# Patient Record
Sex: Female | Born: 1935 | Race: Black or African American | Hispanic: No | Smoking: Former smoker
Health system: Southern US, Community
[De-identification: ages and names within clinical notes are randomized; demographics above are authoritative.]

## PROBLEM LIST (undated history)

## (undated) DIAGNOSIS — H409 Unspecified glaucoma: Secondary | ICD-10-CM

## (undated) DIAGNOSIS — E785 Hyperlipidemia, unspecified: Secondary | ICD-10-CM

## (undated) DIAGNOSIS — J9811 Atelectasis: Secondary | ICD-10-CM

## (undated) DIAGNOSIS — R0789 Other chest pain: Secondary | ICD-10-CM

## (undated) DIAGNOSIS — R918 Other nonspecific abnormal finding of lung field: Secondary | ICD-10-CM

## (undated) DIAGNOSIS — I10 Essential (primary) hypertension: Secondary | ICD-10-CM

## (undated) DIAGNOSIS — M1990 Unspecified osteoarthritis, unspecified site: Secondary | ICD-10-CM

## (undated) DIAGNOSIS — I739 Peripheral vascular disease, unspecified: Secondary | ICD-10-CM

## (undated) DIAGNOSIS — K219 Gastro-esophageal reflux disease without esophagitis: Secondary | ICD-10-CM

## (undated) HISTORY — PX: ANAL FISSURE REPAIR: SHX2312

## (undated) HISTORY — DX: Other chest pain: R07.89

## (undated) HISTORY — PX: BREAST BIOPSY: SHX20

## (undated) HISTORY — DX: Gastro-esophageal reflux disease without esophagitis: K21.9

## (undated) HISTORY — PX: NASAL SINUS SURGERY: SHX719

## (undated) HISTORY — PX: TOTAL ABDOMINAL HYSTERECTOMY W/ BILATERAL SALPINGOOPHORECTOMY: SHX83

## (undated) HISTORY — PX: CATARACT EXTRACTION: SUR2

## (undated) HISTORY — DX: Essential (primary) hypertension: I10

## (undated) HISTORY — DX: Hyperlipidemia, unspecified: E78.5

## (undated) HISTORY — DX: Unspecified glaucoma: H40.9

## (undated) HISTORY — DX: Peripheral vascular disease, unspecified: I73.9

## (undated) HISTORY — DX: Other nonspecific abnormal finding of lung field: R91.8

## (undated) HISTORY — DX: Atelectasis: J98.11

---

## 1997-06-02 ENCOUNTER — Ambulatory Visit (HOSPITAL_COMMUNITY): Admission: RE | Admit: 1997-06-02 | Discharge: 1997-06-02 | Payer: Self-pay | Admitting: Obstetrics and Gynecology

## 1998-03-07 ENCOUNTER — Other Ambulatory Visit: Admission: RE | Admit: 1998-03-07 | Discharge: 1998-03-07 | Payer: Self-pay | Admitting: Obstetrics and Gynecology

## 1998-03-17 ENCOUNTER — Ambulatory Visit (HOSPITAL_COMMUNITY): Admission: RE | Admit: 1998-03-17 | Discharge: 1998-03-17 | Payer: Self-pay | Admitting: Obstetrics and Gynecology

## 1998-04-13 ENCOUNTER — Emergency Department (HOSPITAL_COMMUNITY): Admission: EM | Admit: 1998-04-13 | Discharge: 1998-04-13 | Payer: Self-pay | Admitting: Emergency Medicine

## 1998-04-13 ENCOUNTER — Encounter: Payer: Self-pay | Admitting: Emergency Medicine

## 1998-09-12 ENCOUNTER — Other Ambulatory Visit: Admission: RE | Admit: 1998-09-12 | Discharge: 1998-09-12 | Payer: Self-pay | Admitting: Obstetrics and Gynecology

## 1999-04-05 ENCOUNTER — Encounter: Payer: Self-pay | Admitting: Obstetrics and Gynecology

## 1999-04-05 ENCOUNTER — Ambulatory Visit (HOSPITAL_COMMUNITY): Admission: RE | Admit: 1999-04-05 | Discharge: 1999-04-05 | Payer: Self-pay | Admitting: Obstetrics and Gynecology

## 1999-04-25 ENCOUNTER — Other Ambulatory Visit: Admission: RE | Admit: 1999-04-25 | Discharge: 1999-04-25 | Payer: Self-pay | Admitting: Obstetrics and Gynecology

## 1999-08-16 ENCOUNTER — Encounter: Admission: RE | Admit: 1999-08-16 | Discharge: 1999-08-16 | Payer: Self-pay | Admitting: Gastroenterology

## 1999-08-16 ENCOUNTER — Encounter: Payer: Self-pay | Admitting: Gastroenterology

## 1999-12-19 ENCOUNTER — Encounter: Admission: RE | Admit: 1999-12-19 | Discharge: 1999-12-19 | Payer: Self-pay | Admitting: Orthopedic Surgery

## 1999-12-19 ENCOUNTER — Encounter: Payer: Self-pay | Admitting: Orthopedic Surgery

## 2000-04-16 ENCOUNTER — Encounter: Payer: Self-pay | Admitting: Obstetrics and Gynecology

## 2000-04-16 ENCOUNTER — Ambulatory Visit (HOSPITAL_COMMUNITY): Admission: RE | Admit: 2000-04-16 | Discharge: 2000-04-16 | Payer: Self-pay | Admitting: Obstetrics and Gynecology

## 2000-04-24 ENCOUNTER — Other Ambulatory Visit: Admission: RE | Admit: 2000-04-24 | Discharge: 2000-04-24 | Payer: Self-pay | Admitting: Obstetrics and Gynecology

## 2000-07-31 ENCOUNTER — Ambulatory Visit (HOSPITAL_COMMUNITY): Admission: RE | Admit: 2000-07-31 | Discharge: 2000-07-31 | Payer: Self-pay | Admitting: Gastroenterology

## 2000-12-09 ENCOUNTER — Encounter: Admission: RE | Admit: 2000-12-09 | Discharge: 2000-12-09 | Payer: Self-pay | Admitting: Obstetrics and Gynecology

## 2000-12-09 ENCOUNTER — Encounter: Payer: Self-pay | Admitting: Obstetrics and Gynecology

## 2000-12-27 ENCOUNTER — Encounter: Payer: Self-pay | Admitting: Internal Medicine

## 2000-12-27 ENCOUNTER — Encounter: Admission: RE | Admit: 2000-12-27 | Discharge: 2000-12-27 | Payer: Self-pay | Admitting: Internal Medicine

## 2001-05-06 ENCOUNTER — Ambulatory Visit (HOSPITAL_COMMUNITY): Admission: RE | Admit: 2001-05-06 | Discharge: 2001-05-06 | Payer: Self-pay | Admitting: Obstetrics and Gynecology

## 2001-05-06 ENCOUNTER — Encounter: Payer: Self-pay | Admitting: Obstetrics and Gynecology

## 2001-05-14 ENCOUNTER — Other Ambulatory Visit: Admission: RE | Admit: 2001-05-14 | Discharge: 2001-05-14 | Payer: Self-pay | Admitting: Obstetrics and Gynecology

## 2001-10-10 ENCOUNTER — Encounter: Payer: Self-pay | Admitting: Gastroenterology

## 2001-10-10 ENCOUNTER — Encounter: Admission: RE | Admit: 2001-10-10 | Discharge: 2001-10-10 | Payer: Self-pay | Admitting: Gastroenterology

## 2002-05-07 ENCOUNTER — Ambulatory Visit (HOSPITAL_COMMUNITY): Admission: RE | Admit: 2002-05-07 | Discharge: 2002-05-07 | Payer: Self-pay | Admitting: Obstetrics and Gynecology

## 2002-05-07 ENCOUNTER — Encounter: Payer: Self-pay | Admitting: Obstetrics and Gynecology

## 2002-05-20 ENCOUNTER — Other Ambulatory Visit: Admission: RE | Admit: 2002-05-20 | Discharge: 2002-05-20 | Payer: Self-pay | Admitting: Obstetrics and Gynecology

## 2003-05-10 ENCOUNTER — Ambulatory Visit (HOSPITAL_COMMUNITY): Admission: RE | Admit: 2003-05-10 | Discharge: 2003-05-10 | Payer: Self-pay | Admitting: Obstetrics and Gynecology

## 2004-03-12 ENCOUNTER — Emergency Department (HOSPITAL_COMMUNITY): Admission: EM | Admit: 2004-03-12 | Discharge: 2004-03-12 | Payer: Self-pay

## 2004-05-10 ENCOUNTER — Ambulatory Visit (HOSPITAL_COMMUNITY): Admission: RE | Admit: 2004-05-10 | Discharge: 2004-05-10 | Payer: Self-pay | Admitting: Obstetrics and Gynecology

## 2005-05-23 ENCOUNTER — Ambulatory Visit (HOSPITAL_COMMUNITY): Admission: RE | Admit: 2005-05-23 | Discharge: 2005-05-23 | Payer: Self-pay | Admitting: Obstetrics and Gynecology

## 2006-05-27 ENCOUNTER — Ambulatory Visit (HOSPITAL_COMMUNITY): Admission: RE | Admit: 2006-05-27 | Discharge: 2006-05-27 | Payer: Self-pay | Admitting: Obstetrics and Gynecology

## 2006-11-28 ENCOUNTER — Emergency Department (HOSPITAL_COMMUNITY): Admission: EM | Admit: 2006-11-28 | Discharge: 2006-11-28 | Payer: Self-pay | Admitting: Emergency Medicine

## 2007-05-30 ENCOUNTER — Ambulatory Visit (HOSPITAL_COMMUNITY): Admission: RE | Admit: 2007-05-30 | Discharge: 2007-05-30 | Payer: Self-pay | Admitting: Obstetrics and Gynecology

## 2007-06-16 ENCOUNTER — Ambulatory Visit (HOSPITAL_COMMUNITY): Admission: RE | Admit: 2007-06-16 | Discharge: 2007-06-16 | Payer: Self-pay | Admitting: Interventional Cardiology

## 2007-07-04 ENCOUNTER — Ambulatory Visit: Payer: Self-pay | Admitting: Internal Medicine

## 2007-07-04 DIAGNOSIS — J9819 Other pulmonary collapse: Secondary | ICD-10-CM | POA: Insufficient documentation

## 2007-07-04 DIAGNOSIS — J45909 Unspecified asthma, uncomplicated: Secondary | ICD-10-CM

## 2007-07-04 DIAGNOSIS — J984 Other disorders of lung: Secondary | ICD-10-CM

## 2007-07-04 LAB — CONVERTED CEMR LAB
BUN: 16 mg/dL (ref 6–23)
Basophils Absolute: 0.1 10*3/uL (ref 0.0–0.1)
Basophils Relative: 1 % (ref 0.0–1.0)
Calcium: 9.3 mg/dL (ref 8.4–10.5)
Creatinine, Ser: 0.8 mg/dL (ref 0.4–1.2)
GFR calc non Af Amer: 75 mL/min
HCT: 35 % — ABNORMAL LOW (ref 36.0–46.0)
Hemoglobin: 11.6 g/dL — ABNORMAL LOW (ref 12.0–15.0)
INR: 0.9 (ref 0.8–1.0)
Potassium: 4.9 meq/L (ref 3.5–5.1)
Prothrombin Time: 11.3 s (ref 10.9–13.3)
RDW: 14.3 % (ref 11.5–14.6)
Sodium: 143 meq/L (ref 135–145)
WBC: 5.6 10*3/uL (ref 4.5–10.5)
aPTT: 29.7 s (ref 21.7–29.8)

## 2007-07-07 ENCOUNTER — Telehealth (INDEPENDENT_AMBULATORY_CARE_PROVIDER_SITE_OTHER): Payer: Self-pay | Admitting: *Deleted

## 2007-07-09 ENCOUNTER — Encounter: Payer: Self-pay | Admitting: Internal Medicine

## 2007-07-09 ENCOUNTER — Ambulatory Visit: Payer: Self-pay | Admitting: Internal Medicine

## 2007-07-09 ENCOUNTER — Ambulatory Visit: Admission: RE | Admit: 2007-07-09 | Discharge: 2007-07-09 | Payer: Self-pay | Admitting: Internal Medicine

## 2007-07-11 ENCOUNTER — Encounter: Admission: RE | Admit: 2007-07-11 | Discharge: 2007-07-11 | Payer: Self-pay | Admitting: Internal Medicine

## 2007-07-16 ENCOUNTER — Telehealth: Payer: Self-pay | Admitting: Internal Medicine

## 2007-07-21 ENCOUNTER — Ambulatory Visit: Payer: Self-pay | Admitting: Internal Medicine

## 2007-08-13 ENCOUNTER — Encounter: Payer: Self-pay | Admitting: Internal Medicine

## 2007-11-03 ENCOUNTER — Encounter: Admission: RE | Admit: 2007-11-03 | Discharge: 2007-11-03 | Payer: Self-pay | Admitting: Gastroenterology

## 2008-05-07 ENCOUNTER — Telehealth: Payer: Self-pay | Admitting: Internal Medicine

## 2008-05-14 ENCOUNTER — Ambulatory Visit: Payer: Self-pay | Admitting: Internal Medicine

## 2008-06-01 ENCOUNTER — Ambulatory Visit (HOSPITAL_COMMUNITY): Admission: RE | Admit: 2008-06-01 | Discharge: 2008-06-01 | Payer: Self-pay | Admitting: Obstetrics and Gynecology

## 2008-06-04 ENCOUNTER — Ambulatory Visit: Payer: Self-pay | Admitting: Internal Medicine

## 2008-06-07 ENCOUNTER — Telehealth: Payer: Self-pay | Admitting: Internal Medicine

## 2009-06-09 ENCOUNTER — Ambulatory Visit: Payer: Self-pay | Admitting: Cardiology

## 2009-06-17 ENCOUNTER — Ambulatory Visit (HOSPITAL_COMMUNITY): Admission: RE | Admit: 2009-06-17 | Discharge: 2009-06-17 | Payer: Self-pay | Admitting: Obstetrics and Gynecology

## 2009-06-20 ENCOUNTER — Telehealth (INDEPENDENT_AMBULATORY_CARE_PROVIDER_SITE_OTHER): Payer: Self-pay | Admitting: *Deleted

## 2009-06-24 ENCOUNTER — Ambulatory Visit: Payer: Self-pay | Admitting: Internal Medicine

## 2009-06-30 ENCOUNTER — Telehealth: Payer: Self-pay | Admitting: Internal Medicine

## 2009-07-01 HISTORY — PX: MEDIASTINOSCOPY: SUR861

## 2009-07-08 ENCOUNTER — Ambulatory Visit (HOSPITAL_COMMUNITY): Admission: RE | Admit: 2009-07-08 | Discharge: 2009-07-08 | Payer: Self-pay | Admitting: Internal Medicine

## 2009-07-15 ENCOUNTER — Telehealth: Payer: Self-pay | Admitting: Internal Medicine

## 2009-07-15 ENCOUNTER — Ambulatory Visit: Payer: Self-pay | Admitting: Internal Medicine

## 2009-07-19 ENCOUNTER — Ambulatory Visit: Payer: Self-pay | Admitting: Thoracic Surgery

## 2009-07-19 ENCOUNTER — Encounter: Payer: Self-pay | Admitting: Internal Medicine

## 2009-07-27 ENCOUNTER — Ambulatory Visit (HOSPITAL_COMMUNITY): Admission: RE | Admit: 2009-07-27 | Discharge: 2009-07-27 | Payer: Self-pay | Admitting: Thoracic Surgery

## 2009-07-27 ENCOUNTER — Ambulatory Visit: Payer: Self-pay | Admitting: Thoracic Surgery

## 2009-07-29 ENCOUNTER — Encounter: Payer: Self-pay | Admitting: Internal Medicine

## 2009-07-29 ENCOUNTER — Ambulatory Visit: Payer: Self-pay | Admitting: Thoracic Surgery

## 2009-08-17 ENCOUNTER — Encounter: Payer: Self-pay | Admitting: Internal Medicine

## 2009-08-17 ENCOUNTER — Ambulatory Visit: Payer: Self-pay | Admitting: Thoracic Surgery

## 2009-08-31 ENCOUNTER — Ambulatory Visit: Payer: Self-pay | Admitting: Thoracic Surgery

## 2009-09-14 ENCOUNTER — Ambulatory Visit: Payer: Self-pay | Admitting: Thoracic Surgery

## 2009-09-14 ENCOUNTER — Encounter: Payer: Self-pay | Admitting: Internal Medicine

## 2009-09-23 ENCOUNTER — Telehealth (INDEPENDENT_AMBULATORY_CARE_PROVIDER_SITE_OTHER): Payer: Self-pay | Admitting: *Deleted

## 2009-09-28 ENCOUNTER — Ambulatory Visit: Payer: Self-pay | Admitting: Thoracic Surgery

## 2010-04-10 ENCOUNTER — Encounter
Admission: RE | Admit: 2010-04-10 | Discharge: 2010-04-10 | Payer: Self-pay | Source: Home / Self Care | Attending: Otolaryngology | Admitting: Otolaryngology

## 2010-05-02 NOTE — Progress Notes (Signed)
Summary: records  Phone Note Call from Patient Call back at Home Phone [phone]   Caller: Patient Call For: ramaswamy Reason for Call: Talk to Nurse Summary of Call: CT done in March, Pet in April - would like the written reports of both these test sent to her. Initial call taken by: Eugene Gavia,  September 23, 2009 11:36 AM  Follow-up for Phone Call        Mailed records 6/27.//Juanita Follow-up by: Darletta Moll,  September 26, 2009 1:04 PM

## 2010-05-02 NOTE — Letter (Signed)
Summary: [facility]  [facility]   Imported By: Sherian Rein 08/15/2009 13:29:05  _____________________________________________________________________  External Attachment:    Type:   Image     Comment:   External Document

## 2010-05-02 NOTE — Assessment & Plan Note (Signed)
Summary: ov to discuss CT results per MR/mg   Visit Type:  Follow-up Copy to:  Dr. Stevphen Rochester Primary Provider/Referring Provider:  Dr. Danise Edge  CC:  Pt here to discuss CT results. .  History of Present Illness: OV 07/21/2007: Follouwp after bronchoscopy on 07/09/2007 for scattered GGO and RML chronic collapse. Had BAL RML nad LL - both show lot of polys and growing pseudomonas. AFB negative so far.  Here to discuss results. Reclarified symptoms: only symptoms is atypical precordial pain. No cough. No wheezing. No sputum. No dyspnea. Admits to sinus drainage. Of note, few days befor bronch had yellow sinus drainage. Currently sinus drainage is clear. No interim complaints. REC: 2 week  cipro  OV 06/04/2008: She was lost to followup for nearly a year. She is not sure if our office did not make appt or send letter or she just forgot. In any event, she does not think that cipro given at last visit for pesudomonas isolated on BAL helped. She currently feels very well and is at baseline. The atypical precordial pain is largely resolved. Last episode was 2 weeks ago and before that 6 months ago. She otherwise  OV 06/24/2009: Followup pulmonary nodule/GGO and mediastinal nodes. Since visit last year no complaints. The vague precordial pain has recurred. Very atypical and chronic.  She denies cough, fever, hemoptysis, chills, nausea, vomitting, diarrhea, edema. She also denis sinus drainage. She wants to review her CT scan done on 05/31/2009. CT shows that pre-tracheal node has incresed from 1.8cm -> 1.9cm and AP node has increased from 1.3 -> 1.8cm. Nodules are stable.   Current Medications (verified): 1)  Advair Diskus 250-50 Mcg/dose  Misc (Fluticasone-Salmeterol) .Marland Kitchen.. 1 Puff Daily 2)  Antioxidant A/c/e   Tabs (Multiple Vitamin) 3)  Cal-Mag Aspartate 333.33-167 Mg  Tabs (Calcium-Magnesium) .... Once Daily 4)  Fish Oil   Oil (Fish Oil) .... Once Daily 5)  Chewable Vitamin C 250 Mg  Chew (Ascorbic  Acid) .... Once Daily 6)  Calcium 500/d 500-200 Mg-Unit  Tabs (Calcium Carbonate-Vitamin D) .... Once Daily 7)  Bayer Childrens Aspirin 81 Mg  Chew (Aspirin) 8)  Multivitamins  Tabs (Multiple Vitamin) .... Take 1 Tablet By Mouth Once A Day  Allergies (verified): 1)  ! Sulfa  Past History:  Past Medical History: Hematuria 06/30/2007 - being evaluated by Urology Asthma - ..........................................................Marland Kitchen Dr. Alvie Heidelberg. -> Annual visits On advair x 10 years. Reportedly "under control".  Last albuterol use 200 Hyperlipidemia Chronic nasal allergies Atypical Chest pain x since 2000. Followed by [facility] Ry[facility]. Stress test negative. EKG normal per Dr. Katrinka Blazing during 11/2006 eval RML collpase and other GGO/small nodules...........................Marland KitchenDr Marchelle Gearing  - stable 11/2006 (abd ct lung cut), 5.2009, 05/2008 - no endobronchial lesion on bronch 07/2007  Past Surgical History: Reviewed history from 07/04/2007 and no changes required. Sinus surgery Hysterectomy  Family History: Reviewed history from 07/04/2007 and no changes required. Allergies Cancer - granparents, mom  Social History: Reviewed history from 07/04/2007 and no changes required. Married. Lives with husband. 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   Review of Systems  The patient denies shortness of breath with activity, shortness of breath at rest, productive cough, non-productive cough, coughing up blood, chest pain, irregular heartbeats, acid heartburn, indigestion, loss of appetite, weight change, abdominal pain, difficulty swallowing, sore throat, tooth/dental problems, headaches, nasal congestion/difficulty breathing through nose, sneezing, itching, ear ache, anxiety, depression, hand/feet swelling, joint stiffness or pain, rash, change in color of mucus, and fever.  Vital Signs:  Patient profile:   77 year old female Height:      67 inches Weight:      168 pounds BMI:     26.41 O2 Sat:       100 % on Room air Temp:     97.8 degrees F oral Pulse rate:   84 / minute BP sitting:   110 / 60  (right arm) Cuff size:   regular  Vitals Entered By: Carron Curie CMA (June 24, 2009 12:20 PM)  O2 Flow:  Room air CC: Pt here to discuss CT results.  Comments Medications reviewed with patient Carron Curie CMA  June 24, 2009 12:23 PM Daytime phone number verified with patient.    Physical Exam  General:  well developed, well nourished, in no acute distress Head:  normocephalic and atraumatic Eyes:  PERRLA/EOM intact; conjunctiva and sclera clear Nose:  no deformity, discharge, inflammation, or lesions Mouth:  no deformity or lesions Neck:  no masses, thyromegaly, or abnormal cervical nodes Chest Wall:  no deformities noted Lungs:  clear bilaterally to auscultation and percussion Heart:  regular rate and rhythm, S1, S2 without murmurs, rubs, gallops, or clicks Abdomen:  bowel sounds positive; abdomen soft and non-tender without masses, or organomegaly Msk:  no deformity or scoliosis noted with normal posture Pulses:  pulses normal Extremities:  no clubbing, cyanosis, edema, or deformity noted Neurologic:  CN II-XII grossly intact with normal reflexes, coordination, muscle strength and tone Skin:  intact without lesions or rashes Cervical Nodes:  no significant adenopathy Psych:  alert and cooperative; normal mood and affect; normal attention span and concentration   CT of Chest  Procedure date:  06/09/2009  Findings:      Comparison: 06/11/2008, 06/16/2007 and 11/28/2006.   Findings: Scattered pulmonary parenchymal changes, some which appear hazy and others nodular in addition to the partial obstruction of the right middle lobe (which may be caused by broncholith or prior granulomas exposure) unchanged from prior exams. The stability over time is reassuring however, as bronchoalveolar carcinoma can be slow growing, one may consider continued yearly CT  surveillance for total 5 of years.   Slight increase in size of mediastinal lymph nodes.  Aortic pulmonary window node measures 1.8 x 1.1 cm and previously measured 1.3 x 0.6 cm (series 2 image 20).  Low pre tracheal lymph node measures 1.9 x 1.2 cm versus prior 1.8 x 0.9 cm (series 2 image 23).   Atherosclerotic type changes aorta and coronary arteries. Ascending thoracic aorta with maximal transverse dimension of 3.2 x 3.2 cm.   No significant change nonspecific dome of liver 7 mm and 6 mm lesion.  These may be cysts although too small to adequately characterize.  Calcification lateral limb right adrenal gland may be related to prior trauma or infection.   Mild thoracic kyphosis and degenerative changes.   IMPRESSION: Stable lung parenchymal changes as described above.   Increase in size of mediastinal adenopathy.   No significant change sub centimeter nonspecific liver lesions.   Atherosclerotic type changes coronary arteries and thoracic aorta.   Read By:  Fuller Canada,  M.D.     Released By:  Fuller Canada,  M.D.  ________________________________  Comments:      personally reviewed  Impression & Recommendations:  Problem # 1:  PULMONARY NODULE (ICD-518.89) Assessment Unchanged  Has scattered small ggo. Overall stable 11/2006 -> 05/2008 -> 05/2009  plan Radiology recommending 5 year following in case this is BAC. We  might scan her agai in 1.5 years depending on how mediastinal node PET scan workup tnurns out Orders: Radiology Referral (Radiology)  Problem # 2:  MEDIASTINAL LYMPHADENOPATHY (ICD-785.6) Assessment: Deteriorated There is gradyal increase in size of mediastinal nodes to 1.9cm in past 1 year. Unclear cause. Have recommended PET Scan. If negative, reassure. IF positive, needs EBUS/bx depending on discussions wit her. I explained PET scan and implicatins. She wants to deliberate and read about it on internet before deciding.   Other Orders: Est.  Patient Level II ([address])  Patient Instructions: 1)  google words to search are 'mediastinal nodes' or 'pet scan' 2)  please let us know when you are ready to have PET scan 3)  i hope to hear from you iin next several weeks   Immunization History:  Influenza Immunization History:    Influenza:  fluvax 3+ (04/04/2009)  Pneumovax Immunization History:    Pneumovax:  pneumovax (06/01/2003)   Appended Document: ov to discuss CT results per MR/mg Pls let patient know that the node biopsied were all benign. I want her to finish fu with dr Edwyna Shell. I will see her in 1.5 years with CT chest for the nodules  Appended Document: ov to discuss CT results per MR/mg pt advised of results and rec to have ct and f/u in 1.5 years. order placed. Carron Curie CMA  Aug 02, 2009 5:05 PM    Clinical Lists Changes  Orders: Added new Referral order of Radiology Referral (Radiology) - Signed

## 2010-05-02 NOTE — Progress Notes (Signed)
Summary: Appt with Dr. Edwyna Shell- pt returned call  Phone Note Outgoing Call   Summary of Call: pls call patient today and state tha a) Dr Edwyna Shell recommends bx but b) wants to see her first. So consult done. Pls give appt to see him PER MR.   order has been placed and sent to [facility] to schedule appt with Dr. Edwyna Shell. I LMTCB with pt to explain appt.  Initial call taken by: Carron Curie CMA,  July 15, 2009 2:58 PM  Follow-up for Phone Call        pt returned call from nurse. anxious to speak to nurse asap today. Tivis Ringer, CNA  July 15, 2009 4:16 PM  LMTCB. Carron Curie CMA  July 15, 2009 4:31 PM  spoke with pt and advised that Dr. Edwyna Shell reviewed pt scan and agreed she should see him for EBUS. I advised pt that was why she was scheduled to see Dr. Edwyna Shell. Pt seemed agitated during our conversation.  I asked if this was discussed at her OV and she states yes, but again still seemed agitated by the upcoming appt. I offered pt to speak to MR, but she said not needed. Carron Curie CMA  July 15, 2009 4:43 PM

## 2010-05-02 NOTE — Assessment & Plan Note (Signed)
Summary: discuss pet scan/ok per Jen/apc   Visit Type:  Follow-up Copy to:  Dr. Stevphen Rochester Primary Provider/Referring Provider:  Dr. Danise Edge  CC:  Pt here to discuss PET scan results. .  History of Present Illness: OV 07/21/2007: Follouwp after bronchoscopy on 07/09/2007 for scattered GGO and RML chronic collapse. Had BAL RML nad LL - both show lot of polys and growing pseudomonas. AFB negative so far.  Here to discuss results. Reclarified symptoms: only symptoms is atypical precordial pain. No cough. No wheezing. No sputum. No dyspnea. Admits to sinus drainage. Of note, few days befor bronch had yellow sinus drainage. Currently sinus drainage is clear. No interim complaints. REC: 2 week  cipro  OV 06/04/2008: She was lost to followup for nearly a year. She is not sure if our office did not make appt or send letter or she just forgot. In any event, she does not think that cipro given at last visit for pesudomonas isolated on BAL helped. She currently feels very well and is at baseline. The atypical precordial pain is largely resolved. Last episode was 2 weeks ago and before that 6 months ago. She otherwise  OV 06/24/2009: Followup pulmonary nodule/GGO and mediastinal nodes. Since visit last year no complaints. The vague precordial pain has recurred. Very atypical and chronic.  She denies cough, fever, hemoptysis, chills, nausea, vomitting, diarrhea, edema. She also denis sinus drainage. She wants to review her CT scan done on 05/31/2009. CT shows that pre-tracheal node has incresed from 1.8cm -> 1.9cm and AP node has increased from 1.3 -> 1.8cm. Nodules are stable. REC: PET SCAN  OV 07/15/2009:  Followup pulmonary nodule/GGO and mediastinal nodes. She had PET [facility] 07/08/2009. Today's visit is just to disucss results. No interim complaints. PET scan shows mediastinal nodes are PET HOT but similar in size to the CT scan chest. The pulmonmary nodes due to small size are not pet active. There is no other  PET activitiy elsewhere other than normal.   Current Medications (verified): 1)  Advair Diskus 250-50 Mcg/dose  Misc (Fluticasone-Salmeterol) .Marland Kitchen.. 1 Puff Daily 2)  Antioxidant A/c/e   Tabs (Multiple Vitamin) 3)  Cal-Mag Aspartate 333.33-167 Mg  Tabs (Calcium-Magnesium) .... Once Daily 4)  Fish Oil   Oil (Fish Oil) .... Once Daily 5)  Chewable Vitamin C 250 Mg  Chew (Ascorbic Acid) .... Once Daily 6)  Calcium 500/d 500-200 Mg-Unit  Tabs (Calcium Carbonate-Vitamin D) .... Once Daily 7)  Bayer Childrens Aspirin 81 Mg  Chew (Aspirin) 8)  Multivitamins  Tabs (Multiple Vitamin) .... Take 1 Tablet By Mouth Once A Day  Allergies (verified): 1)  ! Sulfa  Past History:  Family History: Last updated: 07/04/2007 Allergies Cancer - granparents, mom  Social History: Last updated: 07/04/2007 Married. Lives with husband. 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   Risk Factors: Smoking Status: quit (07/04/2007) Passive Smoke Exposure: no (07/04/2007)  Past Medical History: Reviewed history from 06/24/2009 and no changes required. Hematuria 06/30/2007 - being evaluated by Urology Asthma - ..........................................................Marland Kitchen Dr. Alvie Heidelberg. -> Annual visits On advair x 10 years. Reportedly "under control".  Last albuterol use 200 Hyperlipidemia Chronic nasal allergies Atypical Chest pain x since 2000. Followed by [facility] Ryder. Stress test negative. EKG normal per Dr. Katrinka Blazing during 11/2006 eval RML collpase and other GGO/small nodules...........................Marland KitchenDr Marchelle Gearing  - stable 11/2006 (abd ct lung cut), 5.2009, 05/2008 - no endobronchial lesion on bronch 07/2007  Past Surgical History: Reviewed history from 07/04/2007 and no changes required. Sinus surgery  Hysterectomy  Family History: Reviewed history from 07/04/2007 and no changes required. Allergies Cancer - granparents, mom  Social History: Reviewed history from 07/04/2007 and no changes  required. Married. Lives with husband. 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   Review of Systems      See HPI       The patient complains of chest pain.  The patient denies anorexia, fever, weight loss, weight gain, vision loss, decreased hearing, hoarseness, syncope, dyspnea on exertion, peripheral edema, prolonged cough, headaches, hemoptysis, abdominal pain, melena, hematochezia, severe indigestion/heartburn, hematuria, incontinence, genital sores, muscle weakness, suspicious skin lesions, transient blindness, difficulty walking, depression, unusual weight change, abnormal bleeding, enlarged lymph nodes, angioedema, breast masses, and testicular masses.    Vital Signs:  Patient profile:   77 year old female Height:      67 inches Weight:      168.25 pounds O2 Sat:      100 % on Room air Temp:     97.7 degrees F oral Pulse rate:   81 / minute BP sitting:   130 / 72  (right arm) Cuff size:   regular  Vitals Entered By: Carron Curie CMA (July 15, 2009 9:03 AM)  O2 Flow:  Room air CC: Pt here to discuss PET scan results.  Comments Medications reviewed with patient Carron Curie CMA  July 15, 2009 9:03 AM Daytime phone number verified with patient.    Physical Exam  General:  well developed, well nourished, in no acute distress Head:  normocephalic and atraumatic Eyes:  PERRLA/EOM intact; conjunctiva and sclera clear Nose:  no deformity, discharge, inflammation, or lesions Mouth:  no deformity or lesions Neck:  no masses, thyromegaly, or abnormal cervical nodes Chest Wall:  no deformities noted Msk:  no deformity or scoliosis noted with normal posture Pulses:  pulses normal Extremities:  no clubbing, cyanosis, edema, or deformity noted Neurologic:  CN II-XII grossly intact with normal reflexes, coordination, muscle strength and tone Skin:  intact without lesions or rashes Cervical Nodes:  no significant adenopathy Psych:  anxious concerned  worried not  processing information well   MISC. Report  Procedure date:  07/08/2009  Findings:      PET SCAN  PET scan shows mediastinal nodes are PET HOT but similar in size to the CT scan chest. The pulmonmary nodes due to small size are not pet active. There is no other PET activitiy elsewhere other than normal.   Impression & Recommendations:  Problem # 1:  MEDIASTINAL LYMPHADENOPATHY (ICD-785.6) Assessment Unchanged  There is gradyal increase in size of mediastinal nodes to 1.9cm in past 1 year. On PET Scan 07/08/2009 these nodes are PET HOT. Symmetricit, Chronicity and small size tend to favor benign  per radiologist today at [facility]. But growth is worrisome even though nodules are not grown. I had Dr. Edwyna Shell rviewed scans. HE called after patient left and feels patient warrants EBUS with conversion to MEdiastinascopy if EBUS is non diagnositic. Given small size and intermediate prob for malignancy, it is possible EBUS is non diagnostic.  I have explained tthis to patient. She is woried about Cancer possibility. I will call her and let her know she needs to see Dr. Edwyna Shell first.   NOTE: patient sisters were in waiting room and wanted to be with patient. Patient hersefl feels that her anxiety would prevent her from undnerstanding info but she did not want them with her in the exam room. I offered her to call them in but she refused  Orders: Est. Patient Level II ([address]) Surgical Referral (Surgery)  Problem # 2:  PULMONARY NODULE (ICD-518.89) Assessment: Unchanged  Has scattered small ggo. Overall stable 11/2006 -> 05/2008 -> 05/2009  plan Radiology recommending 5 year following in case this is BAC. Will decide this based on results of EBUS/MEd  Orders: Est. Patient Level II ([address])  Patient Instructions: 1)  I am going to talk to Dr. Edwyna Shell and then call you back about next step 2)  .............. 3)  UPDATE: See Dr. Edwyna Shell first. Then procedures will be explained and scheduled. Followup  after bx

## 2010-05-02 NOTE — Letter (Signed)
Summary: [facility]  [facility]   Imported By: Sherian Rein 10/06/2009 14:59:53  _____________________________________________________________________  External Attachment:    Type:   Image     Comment:   External Document

## 2010-05-02 NOTE — Progress Notes (Signed)
Summary: RESULTS/CB  Phone Note Call from Patient Call back at Home Phone [phone]   Caller: Patient Call For: [facility] Summary of Call: PT IS WANTING RESULTS OF CT Initial call taken by: Lacinda Axon,  June 20, 2009 11:47 AM  Follow-up for Phone Call        Please advise CT Chest results, thanks Anne [name]  June 20, 2009 11:57 AM   Additional Follow-up for Phone Call Additional follow up Details #1::        please have her come and see me for followup. That was the plan. Lung infilratess are unchanged but slight increased in lymph nodes. Radiology recommends rescan in 1 year but is best we discus face to face Additional Follow-up by: Kalman Shan MD,  June 20, 2009 12:58 PM    Additional Follow-up for Phone Call Additional follow up Details #2::    pt scheduled to see MR Friday 06-24-2009 at noon.Arman Filter LPN  June 20, 2009 1:17 PM

## 2010-05-02 NOTE — Progress Notes (Signed)
Summary: pet scan  Phone Note Call from Patient Call back at [phone]   Caller: Patient Call For: Tienna Bienkowski Summary of Call: pt have questions about pet scan Initial call taken by: Rickard Patience,  June 30, 2009 1:26 PM  Follow-up for Phone Call        pt has several questions about PET scan. 1. she wanted to know if the dye was inserted through a needle, i advised it is inserted through an IV. She wanted to know how long the test would take, I advised about an hour. Sh ealso wanted to know what it was looking for, I advised it is to take a closer look at the nodes that MR mentioned to her at last OV visit.  She then agreed to have the scan done, so I advised I will palce an order and [facility] will be getting in contact with her about the appt. Carron Curie CMA  June 30, 2009 3:03 PM      Appended Document: pet scan thanks

## 2010-05-02 NOTE — Consult Note (Signed)
Summary: [facility]  [facility]   Imported By: Sherian Rein 08/01/2009 15:16:45  _____________________________________________________________________  External Attachment:    Type:   Image     Comment:   External Document

## 2010-05-05 NOTE — Letter (Signed)
Summary: [facility]  [facility]   Imported By: Sherian Rein 09/05/2009 12:02:40  _____________________________________________________________________  External Attachment:    Type:   Image     Comment:   External Document

## 2010-05-11 ENCOUNTER — Other Ambulatory Visit (HOSPITAL_COMMUNITY): Payer: Self-pay | Admitting: Obstetrics and Gynecology

## 2010-05-11 DIAGNOSIS — Z1231 Encounter for screening mammogram for malignant neoplasm of breast: Secondary | ICD-10-CM

## 2010-06-19 ENCOUNTER — Ambulatory Visit (HOSPITAL_COMMUNITY)
Admission: RE | Admit: 2010-06-19 | Discharge: 2010-06-19 | Disposition: A | Discharge status: Home / Self Care | Payer: Medicare Other | Source: Ambulatory Visit | Attending: Obstetrics and Gynecology | Admitting: Obstetrics and Gynecology

## 2010-06-19 DIAGNOSIS — Z1231 Encounter for screening mammogram for malignant neoplasm of breast: Secondary | ICD-10-CM | POA: Insufficient documentation

## 2010-06-20 LAB — TISSUE CULTURE

## 2010-06-20 LAB — COMPREHENSIVE METABOLIC PANEL
ALT: 28 U/L (ref 0–35)
AST: 34 U/L (ref 0–37)
Alkaline Phosphatase: 56 U/L (ref 39–117)
Calcium: 8.9 mg/dL (ref 8.4–10.5)
Chloride: 106 mEq/L (ref 96–112)
GFR calc Af Amer: >60 mL/min (ref >60)
GFR calc non Af Amer: 57 mL/min — ABNORMAL LOW (ref >60)
Glucose, Bld: 81 mg/dL (ref 70–99)

## 2010-06-20 LAB — CULTURE, RESPIRATORY W GRAM STAIN

## 2010-06-20 LAB — APTT: aPTT: 27 seconds (ref 24–37)

## 2010-06-20 LAB — PROTIME-INR
INR: 1.01 (ref 0.00–1.49)
Prothrombin Time: 13.2 seconds (ref 11.6–15.2)

## 2010-06-20 LAB — AFB CULTURE WITH SMEAR (NOT AT ARMC): Acid Fast Smear: NONE SEEN

## 2010-06-20 LAB — CBC
HCT: 34.9 % — ABNORMAL LOW (ref 36.0–46.0)
MCHC: 34.4 g/dL (ref 30.0–36.0)
MCV: 93 fL (ref 78.0–100.0)
Platelets: 274 10*3/uL (ref 150–400)
RBC: 3.75 MIL/uL — ABNORMAL LOW (ref 3.87–5.11)

## 2010-06-20 LAB — FUNGUS CULTURE W SMEAR

## 2010-06-21 LAB — GLUCOSE, CAPILLARY: Glucose-Capillary: 96 mg/dL (ref 70–99)

## 2010-08-15 NOTE — Letter (Signed)
Aug 17, 2009   Kalman Shan, MD  [address] [place] [address]  [place], [place] [address]   Re:  RASEEL, JANS                 DOB:  [birth year]   Dear Carmin Muskrat,   I saw the patient back today.  Her mediastinoscopy site continues to  heal, but I still want to check it back again in 4 weeks.  As you know,  she had a lymphoid hyperplasia.  She has had some pain in right neck,  but this is improving.  Her blood pressure is 122/58, pulse 74,  respirations 18, sats were 95%.  I will see her again in 4 weeks.   Ines Bloomer, M.D.  Electronically Signed   DPB/MEDQ  D:  08/17/2009  T:  08/18/2009  Job:  [id number]

## 2010-08-15 NOTE — Assessment & Plan Note (Signed)
OFFICE VISIT   Anne [name], Anne [name]  DOB:  December 31, 1930                                        September 28, 2009  CHART #:  [id number]   HISTORY:  The patient is a 77 year old female who underwent bronchoscopy  and mediastinoscopy on July 27, 2009, by Dr. Edwyna Shell for benign  pathology.  She is seen today in routine office visit followup.  She had  some minor difficulty with her mediastinoscopy incision and at one time  required cauterization with silver nitrate and removal of the Vicryl  stitch.  She is seen today for wound check.   PHYSICAL EXAMINATION:  Vital Signs:  Blood pressure is 109/57, pulse is  78, respirations 16, and oxygen saturation is 98% on room air.  Incision  is inspected and it is healing well.  There is no evidence of infection.   ASSESSMENT:  The patient is making continued ongoing recovery from her  mediastinoscopy and the wound appears to be quite healed at this point.  We will see her again on a p.r.n. basis.   Rowe Clack, P.A.-C.   Sherryll Burger  D:  09/28/2009  T:  09/28/2009  Job:  [id number]

## 2010-08-15 NOTE — Consult Note (Signed)
NAME:  Anne [name], Anne [name] NO.:  [id number]   MEDICAL RECORD NO.:  [id number]          PATIENT TYPE:  EMS   LOCATION:  MAJO                         FACILITY:  [facility]   PHYSICIAN:  Lyn Records, M.D.   DATE OF BIRTH:  February 13, 1931   DATE OF CONSULTATION:  11/28/2006  DATE OF DISCHARGE:                                 CONSULTATION   This is an emergency room evaluation.   SUBJECTIVE:  The patient is 77 years of age.  I have seen her in the  past for cardiac evaluation.  She has had an exercise treadmill test  within the past 3 years that was normal.  She has also had a recent  office visit for chest discomfort that we felt clinically was due to  noncardiac chest pain.  She awakened this morning and when she turned on  to her left side in bed she developed chest pressure that was worse  while lying and it radiated into her left arm.  She got up.  The  discomfort was somewhat better while sitting.  It was seemingly made  worse by deep inspiration.  There was no dyspnea, diaphoresis.  The  discomfort lasted approximately 2 hours.  She came to the emergency room  where we saw her.  She was in relatively good condition at the time that  we saw her.  She had been directed to the emergency room by my nurse at  the office.   SIGNIFICANT PAST MEDICAL HISTORY:  1. Hyperlipidemia.  2. Asthma.  3. History of hysterectomy.   MEDICATIONS:  Advair, Singulair, Fish Oil, Lipitor, Vitamin C, E and a  multivitamin per day.   ALLERGIES:  SULFA.   HABITS:  1. Has never been a smoker.  2. Does not drink.   EXAM:  No acute distress.  Blood pressure 152/56, heart rate 67.  HEENT EXAM:  Unremarkable.  CHEST:  Clear.  CARDIAC EXAM:  No rub, no click, no murmur.  ABDOMEN:  Soft.  EXTREMITIES:  No edema.   EKG is normal.  Chest x-ray does not reveal any acute abnormality.   LABORATORY DATA:  Reveals normal BUN, creatinine and potassium.  Three  sets of cardiac markers are negative.   Fibrin D-dimer is 0.23, TSH was  2.096.  We attempted to do a coronary CT angio after multiple IV sticks  and an IV was placed under ultrasound guidance, this vein blew during  the test bolus of contrast.  We did complete a calcium score.  Calcium  was predominantly noted in the left groin area in the proximal LAD and  distal left main.   ASSESSMENT:  Chest discomfort atypical for coronary disease, myocardial  infarction has been ruled out.  Calcium has been identified on coronary  calcium scoring.   PLAN:  Will start aspirin, Lipitor and will have the patient perform a  stress Cardiolite to rule out evidence of myocardial ischemia.  My  overall impression, however, is that this chest pain today was not  ischemic, aortic or related to pulmonary embolism.  Lyn Records, M.D.  Electronically Signed     HWS/MEDQ  D:  11/28/2006  T:  11/28/2006  Job:  [id number]   cc:   Hal T. Stoneking, M.D.

## 2010-08-15 NOTE — Letter (Signed)
July 19, 2009   Kalman Shan, MD  [address] [place] [address]  [place], [place] [address]   Re:  Anne [name], Anne [name]                 DOB:  1930/07/03   Dear Carmin Muskrat,   I saw the patient in the office today.  This 77 year old African  American female has been followed for mediastinal adenopathy that  increased in size to 1.9 cm in the past year.  PET scan done on July 08, 2009, showed that 10R nodes were positive.  She has had no fever,  chills, excessive sputum.  No weight loss.  She apparently had a CT  scan.  She had a bronchoscopy done and has been followed by Dr. Corinda Gubler  for a right lower lobe collapse and also has been known to have  asthmatic bronchitis.  Pseudomonas was growing out of her lung the last  time.  She has some tiny pulmonary nodules with some ground-glass  opacities.   MEDICATIONS:  Advair Diskus, calcium, fish oil, vitamins, Bayer aspirin.  She also takes pravastatin.   ALLERGIES:  She is allergic to sulfa and shellfish caused her shortness  of breath.   FAMILY HISTORY:  Noncontributory.   SOCIAL HISTORY:  She is married.  Quit smoking in 1979.  Does not drink  alcohol on a regular basis.   REVIEW OF SYSTEMS:  GENERAL:  She is 160 pounds.  She is 5 feet 7  inches.  CARDIAC:  No angina or atrial fibrillation.  PULMONARY:  Has asthma.  No hemoptysis.  GI:  No nausea, vomiting, constipation, or diarrhea.  GU:  No kidney disease, dysuria, or frequent urination.  VASCULAR:  No claudication, DVT, TIAs.  NEUROLOGIC:  No dizziness headaches, blackouts, or seizures.  MUSCULOSKELETAL:  No arthritis.  PSYCHIATRIC:  No depression or nervousness.  EYE/ENT:  No changes in her eyesight or hearing.  HEMATOLOGIC:  No problems with bleeding, clotting disorders, or anemia.   PHYSICAL EXAMINATION:  General:  She is a well-developed [place]  American female in no acute distress.  Vital Signs:  Her blood pressure  is 124/68, pulse 82, respirations 16, sats were 97%.  Head, Eyes,  Ears,  Nose, and Throat:  Unremarkable.  Neck:  Supple without thyromegaly.  Chest:  Clear to auscultation and percussion.  Heart:  Regular sinus  rhythm.  No murmurs.  Abdomen:  Soft.  Bowel sounds normal.  Extremities:  Pulses are 2+.  There is no clubbing or edema.  Neurologic:  She is oriented x3.  Sensory and motor intact.  Cranial  nerves are intact.   ASSESSMENT AND PLAN:  I feel that given the positive PET and some mild  enlarging of the lymph node, she would benefit from repeat bronchoscopy  with endobronchial ultrasound and possible mediastinoscopy.  We have  tentatively scheduled this for July 27, 2009, if you are available at  that time.  I appreciate the opportunity of seeing the patient.   Sincerely,   Ines Bloomer, M.D.  Electronically Signed   DPB/MEDQ  D:  07/19/2009  T:  07/20/2009  Job:  [id number]

## 2010-08-15 NOTE — Letter (Signed)
July 29, 2009   Kalman Shan, MD  [address] [place] [address]  [place], [place] [address]   Re:  CRESENCIA, ASMUS                 DOB:  Jul 07, 1930   Dear Dr. Marchelle Gearing:   I saw the patient back today after a fiberoptic bronchoscopy with  endobronchial ultrasound and mediastinoscopy.  All of her tests are  benign.  This showed lymphoid hyperplasia and anthracotic nodes with no  evidence of any cancer or granulomas.  I informed her of the findings.  Her mediastinoscopy site had some swelling and bruising, and we will  plan to see her back again in 3 weeks to check on the site.  I told her  also to get a followup appointment with you.   Ines Bloomer, M.D.  Electronically Signed   DPB/MEDQ  D:  07/29/2009  T:  07/30/2009  Job:  [id number]

## 2010-08-15 NOTE — Letter (Signed)
September 14, 2009   Kalman Shan, MD  [address] [place] [address]  [place], [place] [address]   Re:  TYRELL, BRERETON                 DOB:  [birth year]   Dear Dr. Marchelle Gearing:   The patient came today and she is still having some problems with  healing of her mediastinoscopy site.  We removed her Vicryl stitch.  There is still some slight drainage and reaction there.  I probed it and  it is very superficial and I cauterized it with silver nitrate.  We will  see her back again in 2 weeks for her next check.   Ines Bloomer, M.D.  Electronically Signed   DPB/MEDQ  D:  09/14/2009  T:  09/15/2009  Job:  [id number]

## 2010-08-15 NOTE — Op Note (Signed)
NAMEMarland Kitchen  JAZIAH, HENINGER                 ACCOUNT NO.:  [id number]   MEDICAL RECORD NO.:  [id number]          PATIENT TYPE:  AMB   LOCATION:  CARD                         FACILITY:  [facility]   PHYSICIAN:  Kalman Shan, MD   DATE OF BIRTH:  1930-07-07   DATE OF PROCEDURE:  DATE OF DISCHARGE:                               OPERATIVE REPORT   TYPE OF PROCEDURE:  Bronchoscopy with lavage.   PREPROCEDURE ASSESSMENT:  The history and physical is less than 30 days  old.  I had evaluated her in clinic on July 04, 2007, for right middle  lobe collapse and ground-glass opacities in the right upper lobe and the  left lower lobe of the lung.   Preprocedure laboratory values including CBC, PT/PTT, BMET, were normal.  FEV-1 was 1.68 L or 56%.  Clinically she was stable.   In the interim there were no changes.   PHYSICAL EXAM ON DAY OF PROCEDURE:  Height 68 inches, weight 162 pounds.  Blood pressure 124/73, pulse of 74, respiratory rate of 20.  ASA class 2.  Airway assessment:  Mallampati class I.  Exam was within normal limits.   PREPROCEDURE ASSESSMENT:  The patient was acceptable for IV moderate  sedation, could undergo procedure.   PLANNED PROCEDURE:  Bronchoscopy with planned lavage of the right middle  lobe and left lower lobe.  Biopsy if any the endobronchial lesions seen.   SEDATION PLAN:  fentanyl and Versed moderate sedation with topical  lidocaine anesthesia.   PROCEDURE NOTE:  The scope was introduced at 14 hours and 8 minutes  through the right naris.  Lidocaine was applied to the vocal cords.  Vocal cords was seen moving normally.  It was then bypassed and the  trachea was entered.  The trachea was normal.  There was substantial  cough on entering the trachea.  Further lidocaine was applied and scope  advanced.  The carina looked sharp.  Attention was turned to the right  side.  Adequate lidocaine was applied but still the patient had  intermittent mild cough.  The right main  bronchus was normal.  Right  upper lobe takeoff subsegmental bronchi were normal.  No endobronchial  lesions seen in the right upper lobe.  The right bronchus intermedius  looked a little bit corrugated but was otherwise normal.  Lidocaine was  applied.  The right middle lobe was next visualized.  The entrance  seemed a little bit tight, particularly to the right middle lobe medial  segment.  No extrinsic compression.  No endobronchial lesions were seen.  The airway entering to the right middle lobe looked chronically  indurated.  The scope was then withdrawn and the right lower lobe was  examined and all subsegments were normal.  No endobronchial lesions  seen.  Following this the right middle lobe was lavaged with 20 mL x4.  The first two returns were clear but after that they were thin and  pinkish.  No bleeding was observed.   The scope was withdrawn.  Attention turned to the left side.  Adequate  topical lidocaine was applied.  The left main bronchus, bronchus  intermedius, left upper lobe takeoff, left lingula and left lower lobe  subsegments were all normal.  No endobronchial lesions seen.  The scope  was then wedged in the left lower lobe posterior medial segment and 20  mL x3 lavage was done with adequate returns.  Again the returns were  pinkish.  No visible bleeding was observed.   The scope was then withdrawn and the right middle lobe was again  examined.  No bleeding was seen.  The scope was then withdrawn  completely out at 14 hours and 24 minutes.   SUMMARY:  Normal airway exam except for right middle lobe, which seemed  constricted, especially the medial segment.  Lavage had pinkish returns  from the right middle lobe and the left lower lobe.   COMPLICATIONS:  None.   The patient sent to recovery in good condition.   TOTAL SEDATION USED:  Fentanyl 75 mcg, Versed 6 mg, and 280 mg of  topical lidocaine.   POSTPROCEDURE PLAN:  1. Await lavage results for cytology,  Gram stain, cell count,      microbiology and also CD-4:CD-8 ratios to rule out sarcoid.  2. Albuterol x1 nebulizer given post procedure as a preemptive      strategy in view of for asthma and 56% FEV-1.   The patient will call to obtain the results.      Kalman Shan, MD  Electronically Signed     MR/MEDQ  D:  07/09/2007  T:  07/09/2007  Job:  [id number]

## 2010-08-15 NOTE — Assessment & Plan Note (Signed)
OFFICE VISIT   Anne [name], Anne [name]  DOB:  Mar 23, 1931                                        August 31, 2009  CHART #:  [id number]   The patient came today complaining of some yellow and redness of her  mediastinoscopy site.  It looks like she is having a reaction to a  Vicryl stitch which we removed.  Hopefully, it will heal without any  problem.   Ines Bloomer, M.D.  Electronically Signed   DPB/MEDQ  D:  08/31/2009  T:  09/01/2009  Job:  [id number]

## 2010-08-18 NOTE — Procedures (Signed)
[place]  Patient:    Anne [name], Anne [name]                          MRN: [id number] Proc. Date: 07/31/00 Attending:  Verlin Grills, M.D.                           Procedure Report  DATE OF BIRTH:  01-31-1931  PROCEDURE PERFORMED:  Esophagogastroduodenoscopy.  ENDOSCOPIST:  Verlin Grills, M.D.  INDICATIONS FOR PROCEDURE:  The patient is a 78 year old female.  She has chronic gastroesophageal reflux disease controlled with Prevacid.  Her esophagogastroduodenoscopy in 1997 was normal.  She has recovered from a bout of painless hematochezia.  Her colonoscopy in 1997 was normal.  I discussed with [name] the complications associated with esophagogastroduodenoscopy, colonoscopy and polypectomy including intestinal bleeding and intestinal perforation.  The patient has signed the operative permit.  PREMEDICATION:  Fentanyl 50 mcg, Versed 10 mg.  ENDOSCOPE:  Olympus pediatric video colonoscope and Olympus gastroscope.  DESCRIPTION OF PROCEDURE:  Esophagogastroduodenoscopy.  After obtaining informed consent, the patient was placed in the left lateral decubitus position.  I administered intravenous fentanyl and intravenous Versed to achieve conscious sedation for the procedure.  The patients blood pressure, oxygen saturation and cardiac rhythm were monitored throughout the procedure and documented in the medical record.  The Olympus gastroscope was passed through the posterior hypopharynx into the proximal esophagus without difficulty.  The hypopharynx, larynx and vocal cords appeared normal.  Esophagoscopy:  The proximal, mid and lower segments of the esophagus appeared normal.  Endoscopically, there is no present Barretts esophagus, esophageal mucosal scarring, erosive esophagitis, or esophageal ulceration.  Gastroscopy:  Retroflex view of the gastric cardia and fundus was normal.  The gastric body, antrum and pylorus  appeared normal.  Duodenoscopy:  The duodenal bulb, midduodenum and distal duodenum appeared normal.  ASSESSMENT:  Normal esophagogastroduodenoscopy.  Proctocolonoscopy to the cecum.  Anal inspection was normal.  Digital rectal exam was normal.  The Olympus pediatric video colonoscope was then introduced into the rectum and under direct vision, advanced to the cecum as identified by a normal-appearing ileocecal valve and transillumination of the endoscopic light in the right lower quadrant.  Colonic preparation for the exam was excellent.  Rectum:  Normal.  Sigmoid colon and descending colon:  Normal.  Splenic flexure:  Normal.  Transverse colon:  Normal.  Hepatic flexure:  Normal.  Ascending colon:  Normal.  Cecum and ileocecal valve:  Normal.  ASSESSMENT:  Normal proctocolonoscopy to the cecum.  No signs of colorectal neoplasia. DD:  07/31/00 TD:  07/31/00 Job: [id number]

## 2010-09-19 ENCOUNTER — Telehealth: Payer: Self-pay | Admitting: Internal Medicine

## 2010-09-19 DIAGNOSIS — R911 Solitary pulmonary nodule: Secondary | ICD-10-CM

## 2010-09-19 NOTE — Telephone Encounter (Signed)
Spoke with pt. She states was last seen here in April 2011 and referred to see Dr. Edwyna Shell after last ov. She states that she was dxed with benign hyperplasia. She is currently not having any resp issues, but wants to know if MR wanted to see her back. Please advise if want her to schedule appt, thanks

## 2010-09-22 NOTE — Telephone Encounter (Signed)
Anne [name], please do order for CT chest wihtout contrast feb 2013 and see me for fu. FU pulmonary nodules

## 2010-09-22 NOTE — Telephone Encounter (Signed)
Pt aware of MR's response and is fine with this she will await call from [facility] in fall 2012 or early spring 2013 for repeat ct chest per dr Marchelle Gearing, MR please enter order for exact test needed and when

## 2010-09-22 NOTE — Telephone Encounter (Signed)
Reviewed chart frm march 2011  A) for lymph gland enlargement in chest - this was benign when biopsied in April. No need to worry about this anymore B) she had some scattered spots in lung (unrelated to lymph gland) that wa stable for 2.5 years ( 11/2006 -> 05/2008 -> 05/2009). RAdiologist feel one more scan is needed around fall-winter 2012 or spring 2013 to be on safe side. She can have non contrast CT chest at that time and come back to see me.   Let me know what she says  MR

## 2010-11-03 ENCOUNTER — Other Ambulatory Visit: Payer: Self-pay | Admitting: Gastroenterology

## 2010-11-03 DIAGNOSIS — R140 Abdominal distension (gaseous): Secondary | ICD-10-CM

## 2010-11-03 DIAGNOSIS — R109 Unspecified abdominal pain: Secondary | ICD-10-CM

## 2010-11-08 ENCOUNTER — Ambulatory Visit
Admission: RE | Admit: 2010-11-08 | Discharge: 2010-11-08 | Disposition: A | Discharge status: Home / Self Care | Payer: Medicare Other | Source: Ambulatory Visit | Attending: Gastroenterology | Admitting: Gastroenterology

## 2010-11-08 ENCOUNTER — Other Ambulatory Visit: Payer: Medicare Other

## 2010-11-08 ENCOUNTER — Other Ambulatory Visit: Payer: Self-pay | Admitting: Gastroenterology

## 2010-11-08 DIAGNOSIS — R109 Unspecified abdominal pain: Secondary | ICD-10-CM

## 2010-11-08 DIAGNOSIS — R140 Abdominal distension (gaseous): Secondary | ICD-10-CM

## 2010-12-05 ENCOUNTER — Other Ambulatory Visit: Payer: Self-pay | Admitting: Gastroenterology

## 2010-12-05 DIAGNOSIS — K219 Gastro-esophageal reflux disease without esophagitis: Secondary | ICD-10-CM

## 2010-12-06 ENCOUNTER — Ambulatory Visit
Admission: RE | Admit: 2010-12-06 | Discharge: 2010-12-06 | Disposition: A | Discharge status: Home / Self Care | Payer: Medicare Other | Source: Ambulatory Visit | Attending: Gastroenterology | Admitting: Gastroenterology

## 2010-12-06 DIAGNOSIS — K219 Gastro-esophageal reflux disease without esophagitis: Secondary | ICD-10-CM

## 2010-12-08 ENCOUNTER — Other Ambulatory Visit: Payer: Medicare Other

## 2010-12-26 LAB — CULTURE, RESPIRATORY W GRAM STAIN: Gram Stain: NONE SEEN

## 2010-12-26 LAB — P CARINII SMEAR DFA

## 2010-12-26 LAB — LEGIONELLA PROFILE(CULTURE+DFA/SMEAR)
Legionella Antigen (DFA): NEGATIVE
Legionella Antigen (DFA): NEGATIVE

## 2010-12-26 LAB — AFB CULTURE WITH SMEAR (NOT AT ARMC)

## 2010-12-26 LAB — FUNGUS CULTURE W SMEAR
Fungal Smear: NONE SEEN
Fungal Smear: NONE SEEN

## 2010-12-26 LAB — BODY FLUID CELL COUNT WITH DIFFERENTIAL
Eos, Fluid: 6
Monocyte-Macrophage-Serous Fluid: 26 — ABNORMAL LOW
Neutrophil Count, Fluid: 42 — ABNORMAL HIGH
Total Nucleated Cell Count, Fluid: 34

## 2010-12-26 LAB — PATHOLOGIST SMEAR REVIEW

## 2011-01-12 LAB — COMPREHENSIVE METABOLIC PANEL
AST: 26
BUN: 14
CO2: 28
Calcium: 9.1
Creatinine, Ser: 0.87
GFR calc Af Amer: >60
GFR calc non Af Amer: >60

## 2011-01-12 LAB — POCT CARDIAC MARKERS
CKMB, poc: 1.3
Myoglobin, poc: 70.6
Operator id: 285491
Operator id: 285491
Operator id: 294501
Troponin i, poc: <0.05
Troponin i, poc: <0.05

## 2011-01-12 LAB — CBC
MCHC: 33.4
MCV: 90.4
Platelets: 277
RBC: 3.82 — ABNORMAL LOW

## 2011-01-12 LAB — PROTIME-INR
INR: 1
Prothrombin Time: 13.2

## 2011-01-12 LAB — MAGNESIUM: Magnesium: 2

## 2011-01-18 ENCOUNTER — Other Ambulatory Visit: Payer: Self-pay | Admitting: Internal Medicine

## 2011-05-15 ENCOUNTER — Other Ambulatory Visit: Payer: Self-pay | Admitting: Obstetrics and Gynecology

## 2011-05-15 DIAGNOSIS — Z1231 Encounter for screening mammogram for malignant neoplasm of breast: Secondary | ICD-10-CM

## 2011-05-29 ENCOUNTER — Telehealth: Payer: Self-pay | Admitting: Internal Medicine

## 2011-05-29 NOTE — Telephone Encounter (Signed)
Spoke with pt. She states reviewing her medical records and noticed that she needs follow up with MR. I advised looks like she was supposed to follow up after her bx with Edwyna Shell and this was never done, so does need follow up here, but looks like due to have CT Chest in Feb 2013 and so we need to go ahead and set this up. She declined, stating that she does not want anything else ordered until she sees MR. Appt with MR scheduled for 06/18/11.

## 2011-06-18 ENCOUNTER — Ambulatory Visit (INDEPENDENT_AMBULATORY_CARE_PROVIDER_SITE_OTHER): Payer: Medicare Other | Admitting: Internal Medicine

## 2011-06-18 ENCOUNTER — Encounter: Payer: Self-pay | Admitting: *Deleted

## 2011-06-18 ENCOUNTER — Telehealth: Payer: Self-pay | Admitting: Internal Medicine

## 2011-06-18 VITALS — BP 130/60 | HR 75 | Temp 98.0°F | Ht 67.0 in | Wt 162.4 lb

## 2011-06-18 DIAGNOSIS — J45909 Unspecified asthma, uncomplicated: Secondary | ICD-10-CM

## 2011-06-18 DIAGNOSIS — J984 Other disorders of lung: Secondary | ICD-10-CM

## 2011-06-18 NOTE — Assessment & Plan Note (Signed)
We discussed the overall stable nature of nodules over 2.5 years 2008 -> 2011 and low prob for cancer but explained that BAC is a remote possiblity. She is conflicted about having CT (Xrt exposure) v no CT (fear of  Malignancy) despite very low prob for BAC. We agreed we would get 2nd opinion from Dr Fredirick Lathe radiologist who just emailed me back stating given stability and lack of nodularity expectant approach without CT is oky. I will let patient know > 50% of this > 15 min visit spent in face to face counseling

## 2011-06-18 NOTE — Assessment & Plan Note (Signed)
She has occ mild random dyspnea relieved by albuterol prn < 2 per week. SHe is not interested in workup. She prefers expectant followup

## 2011-06-18 NOTE — Patient Instructions (Signed)
#  shortness of breath  = will keep an eye on this #Lung nodules  - will discuss with Dr Fredirick Lathe our chest radiologist and get back to you #Followup  based on followup phone call

## 2011-06-18 NOTE — Telephone Encounter (Signed)
Let her know that I dw Dr Fredirick Lathe and we agreed no further need for CT scan chest for the lung nodules. FU as needed only

## 2011-06-18 NOTE — Progress Notes (Signed)
Subjective:    Patient ID: Anne [name], female    DOB: 1930/05/06, 78 y.o.   MRN: [id number]  HPI Pt here to discuss PET scan results. .  reports that she has never smoked. She does not have any smokeless tobacco history on file. Body mass index is 25.44 kg/(m^2).    History of Present Illness:  OV 07/21/2007: Follouwp after bronchoscopy on 07/09/2007 for scattered GGO and RML chronic collapse. Had BAL RML nad LL - both show lot of polys and growing pseudomonas. AFB negative so far. Here to discuss results. Reclarified symptoms: only symptoms is atypical precordial pain. No cough. No wheezing. No sputum. No dyspnea. Admits to sinus drainage. Of note, few days befor bronch had yellow sinus drainage. Currently sinus drainage is clear. No interim complaints. REC: 2 week cipro   OV 06/04/2008: She was lost to followup for nearly a year. She is not sure if our office did not make appt or send letter or she just forgot. In any event, she does not think that cipro given at last visit for pesudomonas isolated on BAL helped. She currently feels very well and is at baseline. The atypical precordial pain is largely resolved. Last episode was 2 weeks ago and before that 6 months ago. She otherwise   OV 06/24/2009: Followup pulmonary nodule/GGO and mediastinal nodes. Since visit last year no complaints. The vague precordial pain has recurred. Very atypical and chronic. She denies cough, fever, hemoptysis, chills, nausea, vomitting, diarrhea, edema. She also denis sinus drainage. She wants to review her CT scan done on 05/31/2009. CT shows that pre-tracheal node has incresed from 1.8cm -> 1.9cm and AP node has increased from 1.3 -> 1.8cm. Nodules are stable. REC: PET SCAN   OV 07/15/2009: Followup pulmonary nodule/GGO and mediastinal nodes. She had PET [facility] 07/08/2009. Today's visit is just to disucss results. No interim complaints. PET scan shows mediastinal nodes are PET HOT but similar in size to the CT scan chest. The  pulmonmary nodes due to small size are not pet active. There is no other PET activitiy elsewhere other than normal.   Patient Instructions:  1) I am going to talk to Dr. Edwyna Shell and then call you back about next step  2) ..............  3) UPDATE: See Dr. Edwyna Shell first. Then procedures will be explained and scheduled. Followup after bx   MEdiastinaoscopy 07/27/2009 - Benign node of 4R and 4L. ANthracotoic pigment +. No granuoma. Non malignant  OV 06/18/2011  Followup for above. She was recommended a 5 year CT which is due now but she is questoning the need (CTs 8.28.08, 06/16/07, 06/11/08, and 06/09/2009). She is worried about radiation exposure. AT same time sshe is conflicted and wants cancer ruled out for sure (we discussed the low prob of BAC). No new complaints. Just occasional mild dyspnea when she lies down and constant sinus drainage.  Past, Family, Social reviewed: no change since last visit. Husband now with advanced dementia     Past Medical History  Diagnosis Date  . Hematuria   . Asthma   . Hyperlipidemia   . Allergic rhinitis   . Atypical chest pain   . Collapse of right lung   . Lung nodules      Family History  Problem Relation Age of Onset  . Cancer Mother      History   Social History  . Marital Status: Married    Spouse Name: N/A    Number of Children: N/A  . Years of  Education: N/A   Occupational History  . retired    Social History Main Topics  . Smoking status: Never Smoker   . Smokeless tobacco: Not on file  . Alcohol Use: Not on file  . Drug Use: Not on file  . Sexually Active: Not on file   Other Topics Concern  . Not on file   Social History Narrative  . No narrative on file     Allergies  Allergen Reactions  . Iodine      No outpatient prescriptions prior to visit.      OVReview of Systems  Constitutional: Negative for fever and unexpected weight change.  HENT: Negative for ear pain, nosebleeds, congestion, sore throat,  rhinorrhea, sneezing, trouble swallowing, dental problem, postnasal drip and sinus pressure.   Eyes: Negative for redness and itching.  Respiratory: Negative for cough, chest tightness, shortness of breath and wheezing.   Cardiovascular: Negative for palpitations and leg swelling.  Gastrointestinal: Negative for nausea and vomiting.  Genitourinary: Negative for dysuria.  Musculoskeletal: Negative for joint swelling.  Skin: Negative for rash.  Neurological: Negative for headaches.  Hematological: Does not bruise/bleed easily.  Psychiatric/Behavioral: Negative for dysphoric mood. The patient is not nervous/anxious.        Objective:   Physical Exam Physical Exam  General: well developed, well nourished, in no acute distress  Head: normocephalic and atraumatic  Eyes: PERRLA/EOM intact; conjunctiva and sclera clear  Nose: no deformity, discharge, inflammation, or lesions  Mouth: no deformity or lesions  Neck: no masses, thyromegaly, or abnormal cervical nodes  Chest Wall: no deformities noted  Msk: no deformity or scoliosis noted with normal posture  Pulses: pulses normal  Extremities: no clubbing, cyanosis, edema, or deformity noted  Neurologic: CN II-XII grossly intact with normal reflexes, coordination, muscle strength and tone  Skin: intact without lesions or rashes  Cervical Nodes: no significant adenopathy  Psych: anxious  concerned  worried  not processing information well        Assessment & Plan:

## 2011-06-20 NOTE — Telephone Encounter (Signed)
[email or web address].Nayda Riesen, CMA  

## 2011-06-20 NOTE — Telephone Encounter (Signed)
Pt is aware. Anne [name], CMA  

## 2011-06-26 ENCOUNTER — Ambulatory Visit (HOSPITAL_COMMUNITY)
Admission: RE | Admit: 2011-06-26 | Discharge: 2011-06-26 | Disposition: A | Discharge status: Home / Self Care | Payer: Medicare Other | Source: Ambulatory Visit | Attending: Obstetrics and Gynecology | Admitting: Obstetrics and Gynecology

## 2011-06-26 DIAGNOSIS — Z1231 Encounter for screening mammogram for malignant neoplasm of breast: Secondary | ICD-10-CM | POA: Insufficient documentation

## 2011-07-24 ENCOUNTER — Encounter: Payer: Self-pay | Admitting: Obstetrics and Gynecology

## 2011-07-24 ENCOUNTER — Ambulatory Visit (INDEPENDENT_AMBULATORY_CARE_PROVIDER_SITE_OTHER): Payer: Medicare Other | Admitting: Obstetrics and Gynecology

## 2011-07-24 VITALS — BP 118/68 | Ht 67.0 in | Wt 160.0 lb

## 2011-07-24 DIAGNOSIS — M810 Age-related osteoporosis without current pathological fracture: Secondary | ICD-10-CM

## 2011-07-24 DIAGNOSIS — J45909 Unspecified asthma, uncomplicated: Secondary | ICD-10-CM | POA: Insufficient documentation

## 2011-07-24 DIAGNOSIS — Z01419 Encounter for gynecological examination (general) (routine) without abnormal findings: Secondary | ICD-10-CM

## 2011-07-24 DIAGNOSIS — R351 Nocturia: Secondary | ICD-10-CM

## 2011-07-24 DIAGNOSIS — Z124 Encounter for screening for malignant neoplasm of cervix: Secondary | ICD-10-CM

## 2011-07-24 DIAGNOSIS — Z9109 Other allergy status, other than to drugs and biological substances: Secondary | ICD-10-CM

## 2011-07-24 DIAGNOSIS — J309 Allergic rhinitis, unspecified: Secondary | ICD-10-CM

## 2011-07-24 DIAGNOSIS — N952 Postmenopausal atrophic vaginitis: Secondary | ICD-10-CM

## 2011-07-24 MED ORDER — ESTRADIOL 25 MCG VA TABS: 10.0000 ug | ORAL_TABLET | VAGINAL | Status: DC

## 2011-07-24 NOTE — Patient Instructions (Signed)
Osteoporosis  Osteoporosis is a disease of the bones that makes them weaker and prone to break (fracture). By their mid-30s, most people begin to gradually lose bone strength. If this is severe enough, osteoporosis may occur. Osteopenia is a less severe weakness of the bones, which places you at risk for osteoporosis. It is important to identify if you have osteoporosis or osteopenia. Bone fractures from osteoporosis (especially hip and spine fractures) are a major cause of hospitalization, loss of independence, and can lead to life-threatening complications.  CAUSES    There are a number of causes and risk factors:   Gender. Women are at a higher risk for osteoporosis than men.    Age. Bone formation slows down with age.    Ethnicity. For unclear reasons, white and Asian women are at higher risk for osteoporosis. Hispanic and African American women are at increased, but lesser, risk.    Family history of osteoporosis can mean that you are at a higher risk for getting it.    History of bone fractures indicates you may be at higher risk of another.    Calcium is very important for bone health and strength. Not enough calcium in your diet increases your risk for osteoporosis. Vitamin D is important for calcium metabolism. You get vitamin D from sunlight, foods, or supplements.    Physical activity. Bones get stronger with weight-bearing exercise and weaker without use.    Smoking is associated with decreased bone strength.    Medicines. Cortisone medicines, too much thyroid medicine, some cancer and seizure medicines, and others can weaken bones and cause osteoporosis.    Decreased body weight is associated with osteoporosis. The small amount of estrogen-type molecules produced in fat cells seems to protect the bones.    Menopausal decrease in the hormone estrogen can cause osteoporosis.    Low levels of the hormone testosterone can cause osteoporosis.     Some medical conditions can lead to osteoporosis (hyperthyroidism, hyperparathyroidism, B12 deficiency).   SYMPTOMS    Usually, no symptoms are felt as the bones weaken. The first symptoms are generally related to bone fractures. You may have silent, tiny bone fractures, especially in your spine. This can cause height loss and forward bending of the spine (kyphosis).  DIAGNOSIS    You or your caregiver may suspect osteoporosis based on height loss and kyphosis. Osteoporosis or osteopenia may be identified on an X-ray done for other reasons. A bone density measurement will likely be taken. Your bones are often measured at your lower spine or your hips. Measurement is done by an X-ray called a DEXA scan, or sometimes by a computerized X-ray scan (CT or CAT scan). Other tests may be done to find the cause of osteoporosis, such as blood tests to measure calcium and vitamin D, or to monitor treatment.  TREATMENT    The goal of osteoporosis treatment is to prevent fractures. This is done through medicine and home care treatments. Treatment will slow the weakening of your bones and strengthen them where possible. Measures to decrease the likelihood of falling and fracturing a bone are also important.  Medicine   You may need supplements if you are not getting enough calcium, vitamin D, and vitamin B12.    If you are female and menopausal, you should discuss the option of estrogen replacement or estrogen-like medicine with your caregiver.    Medicines can be taken by mouth or injection to help build bone strength. When taken by mouth, there are important directions   that you need to follow.    Calcitonin is a hormone made by the thyroid gland that can help build bone strength and decrease fracture risk in the spine. It can be taken by nasal spray or injection.    Parathyroid hormone can be injected to help build bone strength.    You will need to continue to get enough calcium intake with any of these medicines.    FALL PREVENTION   If you are unsteady on your feet, use a cane, walker, or walk with someone's help.    Remove loose rugs or electrical cords from your home.    Keep your home well lit at night. Use glasses if you need them.    Avoid icy streets and wet or waxed floors.    Hold the railing when using stairs.    Watch out for your pets.    Install grab bars in your bathroom.    Exercise. Physical activity, especially weight-bearing exercise, helps strengthen bones. Strength and balance exercise, such as tai chi, helps prevent falls.    Alcohol and some medicines can make you more likely to fall. Discuss alcohol use with your caregiver. Ask your caregiver if any of your medicines might increase your risk for falling. Ask if safer alternatives are available.   HOME CARE INSTRUCTIONS     Try to prevent and avoid falls.    To pick up objects, bend at the knees. Do not bend with your back.    Do not smoke. If you smoke, ask for help to stop.    Have adequate calcium and vitamin D in your diet. Talk with your caregiver about amounts.    Before exercising, ask your caregiver what exercises will be good for you.    Only take over-the-counter or prescription medicines for pain, discomfort, or fever as directed by your caregiver.   SEEK MEDICAL CARE IF:     You have had a fracture and your pain is not controlled.    You have had a fracture and you are not able to return to activities as expected.    You are reinjured.    You develop side effects from medicines, especially stomach pain or trouble swallowing.    You develop new, unexplained problems.   SEEK IMMEDIATE MEDICAL CARE IF:     You develop sudden, severe pain in your back.    You develop pain after an injury or fall.   Document Released: 12/27/2004 Document Revised: 03/08/2011 Document Reviewed: 03/03/2011  [employer] Patient Information 2012 [employer].

## 2011-07-24 NOTE — Progress Notes (Signed)
The patient is not taking hormone replacement therapy The patient  is taking a Calcium supplement. Exercise is limited to caring for chronically ill husband. Post-menopausal bleeding:no s/physt.  Last Pap: was normal February  202004 Last mammogram: was normal March  2013 Last DEXA scan : T= -1.03 June 2009 Last colonoscopy:was normal unsure  2009  Urinary symptoms: none Normal bowel movements: No:  Reports abuse at home: No:   Subjective:    Anne [name] is a 78 y.o. female  who presents for annual exam.  1)She had osteoporosis on last DXA 2012.   Never obtained the Thoracic and lumbar spine films which were ordered 4/12 Had C-spine films done per Dr. Nickola Major which showed no fractures. Has some chronic back pain. 2)Pt notes episodes of nocturia which are intermittent.  She denies daytime frequency, dyuria, hematuria or incontinence.She gets up at least once to take her husband to the bathroom.  The following portions of the patient's history were reviewed and updated as appropriate: allergies, current medications, past family history, past medical history, past social history, past surgical history and problem list.  Review of Systems Pertinent items are noted in HPI. Gastrointestinal:No change in bowel habits, no abdominal pain, no rectal bleeding Genitourinary:negative for dysuria, frequency, hematuria, nocturia and urinary incontinence    Objective:     BP 118/68  Ht 5\' 7"  (1.702 m)  Wt 160 lb (72.576 kg)  BMI 25.06 kg/m2  Weight:  Wt Readings from Last 1 Encounters:  07/24/11 160 lb (72.576 kg)     BMI: Body mass index is 25.06 kg/(m^2). General Appearance: Alert, appropriate appearance for age. No acute distress HEENT: Grossly normal Neck / Thyroid: Supple, no masses, nodes or enlargement Lungs: clear to auscultation bilaterally Back: No CVA tenderness Breast Exam: No masses or nodes.No dimpling, nipple retraction or discharge. Cardiovascular: Regular rate and  rhythm. S1, S2, no murmur Gastrointestinal: Soft, non-tender, no masses or organomegaly Pelvic Exam: External genitalia: normal general appearance Vaginal: atrophic mucosa and vaginal vault, well healed and suspended Cervix: removed surgically Adnexa: non palpable Uterus: absent Rectovaginal: normal rectal, no masses Lymphatic Exam: Non-palpable nodes in neck, clavicular, axillary, or inguinal regions Skin: no rash or abnormalities Neurologic: Normal gait and speech, no tremor  Psychiatric: Alert and oriented, appropriate affect.    Urinalysis:Not done      Assessment:    Menopause Atrophic vaginal changes possibly responsible for increased nocturia Hx GERD suboptimal candidate for bisphosphonates Osteoporosis with patient declining pharmacologic treatment   Plan:    All questions answered. Diagnosis explained in detail, including differential. Discussed healthy lifestyle modifications. Agricultural engineer distributed. Follow up as needed.  Pt consents to PT consult to start intensive regimen to address osteoporosis Urine culture Vagifem Follow-up:  3mos.

## 2011-07-25 ENCOUNTER — Other Ambulatory Visit: Payer: Self-pay

## 2011-07-25 ENCOUNTER — Other Ambulatory Visit (INDEPENDENT_AMBULATORY_CARE_PROVIDER_SITE_OTHER): Payer: Medicare Other

## 2011-07-25 ENCOUNTER — Telehealth: Payer: Self-pay | Admitting: Obstetrics and Gynecology

## 2011-07-25 ENCOUNTER — Telehealth: Payer: Self-pay

## 2011-07-25 DIAGNOSIS — R351 Nocturia: Secondary | ICD-10-CM

## 2011-07-25 LAB — POCT URINALYSIS DIPSTICK
Bilirubin, UA: NEGATIVE
Glucose, UA: NEGATIVE
Ketones, UA: NEGATIVE
Spec Grav, UA: 1.01

## 2011-07-25 NOTE — Telephone Encounter (Signed)
Routed to chandra 

## 2011-07-25 NOTE — Telephone Encounter (Signed)
[facility] pt 

## 2011-07-25 NOTE — Telephone Encounter (Signed)
Pc to pt per recs per [facility]. Pt to have xrays done of thoracic and lumbar spine to r/o fracture. The xray performed by Dr. Nickola Major was an xray of the cervical spine only. Pt agrees. Orders sent to [facility] [address]. Pt to walk in at convience to have done. Rec PT therapy if no fractures found on xrays per [facility]. Pt voices understanding. Pt put on recall list for 3 mo fu.

## 2011-07-26 ENCOUNTER — Other Ambulatory Visit: Payer: Medicare Other

## 2011-07-27 ENCOUNTER — Other Ambulatory Visit: Payer: Self-pay | Admitting: Obstetrics and Gynecology

## 2011-07-27 LAB — URINE CULTURE
Colony Count: NO GROWTH
Organism ID, Bacteria: NO GROWTH

## 2011-07-27 NOTE — Telephone Encounter (Signed)
Routed to triage 

## 2011-07-27 NOTE — Telephone Encounter (Signed)
Tc from pt. Pt states,"went to pick up rx from pharmacy and told doctor's office needs to call to clarify rx". Pc to [facility]([address] Spoke with Keith(pharm). Pt needs Vagifem 1 tab pv 2x/wkly #8 with 12 rf's. Told pt urine culture-neg. Pt voices understanding.

## 2011-07-27 NOTE — Telephone Encounter (Signed)
[facility] PT 

## 2011-07-27 NOTE — Telephone Encounter (Signed)
Lm on vm to cb per rx request.  

## 2011-08-21 ENCOUNTER — Telehealth: Payer: Self-pay | Admitting: Obstetrics and Gynecology

## 2011-08-21 NOTE — Telephone Encounter (Signed)
Pt left message that she got a letter to schedule a f/u left voice message for pt to call back for me or Karren Burly DF

## 2011-08-23 NOTE — Telephone Encounter (Signed)
Pt called waiting for PT referral she is feeling pretty good not taking any meds for osteoporosis at last visit [facility] rec PT will have Karren Burly f/u with [facility] regarding referral DFaulconerRN

## 2011-08-24 NOTE — Telephone Encounter (Signed)
Tc to pt per telephone call. Informed pt 07/25/11 telephone call. Pt was to have thoracic and lumbar spine xray done first to r/o fracture before PT referral done. Pt voices understanding. Pt will have xrays done and will I will call pt back with results. Pt agrees.

## 2011-08-30 ENCOUNTER — Ambulatory Visit
Admission: RE | Admit: 2011-08-30 | Discharge: 2011-08-30 | Disposition: A | Discharge status: Home / Self Care | Payer: Medicare Other | Source: Ambulatory Visit | Attending: Obstetrics and Gynecology | Admitting: Obstetrics and Gynecology

## 2011-09-04 ENCOUNTER — Other Ambulatory Visit: Payer: Self-pay

## 2011-09-04 ENCOUNTER — Telehealth: Payer: Self-pay

## 2011-09-04 DIAGNOSIS — M810 Age-related osteoporosis without current pathological fracture: Secondary | ICD-10-CM

## 2011-09-04 NOTE — Telephone Encounter (Signed)
Tc to pt per thoracic/lumbar x ray results=no fractures. PT appt sched 09/17/11@ 11:30a@ [facility]. Pt may have to change appt due to personal sched. Number given for pt to call to resched if needed. Pt voices understanding.

## 2011-09-17 ENCOUNTER — Ambulatory Visit
Payer: Medicare Other | Attending: Obstetrics and Gynecology | Admitting: Rehabilitative and Restorative Service Providers"

## 2011-09-17 DIAGNOSIS — IMO0001 Reserved for inherently not codable concepts without codable children: Secondary | ICD-10-CM | POA: Insufficient documentation

## 2011-09-17 DIAGNOSIS — M6281 Muscle weakness (generalized): Secondary | ICD-10-CM | POA: Insufficient documentation

## 2011-09-17 DIAGNOSIS — R293 Abnormal posture: Secondary | ICD-10-CM | POA: Insufficient documentation

## 2011-09-25 ENCOUNTER — Ambulatory Visit: Payer: Medicare Other

## 2011-09-27 ENCOUNTER — Ambulatory Visit: Payer: Medicare Other

## 2011-10-02 ENCOUNTER — Ambulatory Visit: Payer: Medicare Other | Attending: Gastroenterology | Admitting: Physical Therapy

## 2011-10-02 DIAGNOSIS — M6281 Muscle weakness (generalized): Secondary | ICD-10-CM | POA: Insufficient documentation

## 2011-10-02 DIAGNOSIS — R293 Abnormal posture: Secondary | ICD-10-CM | POA: Insufficient documentation

## 2011-10-02 DIAGNOSIS — IMO0001 Reserved for inherently not codable concepts without codable children: Secondary | ICD-10-CM | POA: Insufficient documentation

## 2011-10-03 ENCOUNTER — Encounter: Payer: Medicare Other | Admitting: Physical Therapy

## 2011-10-08 ENCOUNTER — Ambulatory Visit: Payer: Medicare Other | Admitting: Physical Therapy

## 2011-10-09 ENCOUNTER — Encounter: Payer: Medicare Other | Admitting: Physical Therapy

## 2011-10-10 ENCOUNTER — Ambulatory Visit: Payer: Medicare Other | Admitting: Physical Therapy

## 2011-10-29 ENCOUNTER — Encounter: Payer: Medicare Other | Admitting: Physical Therapy

## 2011-10-31 ENCOUNTER — Encounter: Payer: Medicare Other | Admitting: Physical Therapy

## 2012-04-08 ENCOUNTER — Other Ambulatory Visit: Payer: Self-pay | Admitting: Orthopedic Surgery

## 2012-04-09 ENCOUNTER — Encounter (HOSPITAL_BASED_OUTPATIENT_CLINIC_OR_DEPARTMENT_OTHER): Payer: Self-pay | Admitting: *Deleted

## 2012-04-09 NOTE — Progress Notes (Signed)
No labs needed

## 2012-04-10 NOTE — Progress Notes (Signed)
Cleared by dr kasik 

## 2012-04-11 ENCOUNTER — Encounter (HOSPITAL_BASED_OUTPATIENT_CLINIC_OR_DEPARTMENT_OTHER): Payer: Self-pay | Admitting: Certified Registered"

## 2012-04-11 ENCOUNTER — Ambulatory Visit (HOSPITAL_BASED_OUTPATIENT_CLINIC_OR_DEPARTMENT_OTHER)
Admission: RE | Admit: 2012-04-11 | Discharge: 2012-04-11 | Disposition: A | Discharge status: Home / Self Care | Payer: Medicare Other | Source: Ambulatory Visit | Attending: Orthopedic Surgery | Admitting: Orthopedic Surgery

## 2012-04-11 ENCOUNTER — Encounter (HOSPITAL_BASED_OUTPATIENT_CLINIC_OR_DEPARTMENT_OTHER): Payer: Self-pay | Admitting: Orthopedic Surgery

## 2012-04-11 ENCOUNTER — Ambulatory Visit (HOSPITAL_BASED_OUTPATIENT_CLINIC_OR_DEPARTMENT_OTHER): Payer: Medicare Other | Admitting: Certified Registered"

## 2012-04-11 ENCOUNTER — Encounter (HOSPITAL_BASED_OUTPATIENT_CLINIC_OR_DEPARTMENT_OTHER)
Admission: RE | Disposition: A | Discharge status: Home / Self Care | Payer: Self-pay | Source: Ambulatory Visit | Attending: Orthopedic Surgery

## 2012-04-11 DIAGNOSIS — M129 Arthropathy, unspecified: Secondary | ICD-10-CM | POA: Insufficient documentation

## 2012-04-11 DIAGNOSIS — Z809 Family history of malignant neoplasm, unspecified: Secondary | ICD-10-CM | POA: Insufficient documentation

## 2012-04-11 DIAGNOSIS — J45909 Unspecified asthma, uncomplicated: Secondary | ICD-10-CM | POA: Insufficient documentation

## 2012-04-11 DIAGNOSIS — J309 Allergic rhinitis, unspecified: Secondary | ICD-10-CM | POA: Insufficient documentation

## 2012-04-11 DIAGNOSIS — Z90710 Acquired absence of both cervix and uterus: Secondary | ICD-10-CM | POA: Insufficient documentation

## 2012-04-11 DIAGNOSIS — R229 Localized swelling, mass and lump, unspecified: Secondary | ICD-10-CM | POA: Insufficient documentation

## 2012-04-11 DIAGNOSIS — E785 Hyperlipidemia, unspecified: Secondary | ICD-10-CM | POA: Insufficient documentation

## 2012-04-11 HISTORY — DX: Unspecified osteoarthritis, unspecified site: M19.90

## 2012-04-11 HISTORY — PX: MASS EXCISION: SHX2000

## 2012-04-11 LAB — POCT HEMOGLOBIN-HEMACUE: Hemoglobin: 11.9 g/dL — ABNORMAL LOW (ref 12.0–15.0)

## 2012-04-11 SURGERY — EXCISION MASS
Anesthesia: Monitor Anesthesia Care | Site: Arm Upper | Laterality: Right | Wound class: Clean

## 2012-04-11 MED ORDER — OXYCODONE HCL 5 MG/5ML PO SOLN: 5.0000 mg | Freq: Once | ORAL | Status: DC | PRN

## 2012-04-11 MED ORDER — CEFAZOLIN SODIUM-DEXTROSE 2-3 GM-% IV SOLR
2.0000 g | INTRAVENOUS | Status: AC
  Administered 2012-04-11: 2 g via INTRAVENOUS

## 2012-04-11 MED ORDER — HYDROCODONE-ACETAMINOPHEN 5-325 MG PO TABS: 1.0000 | ORAL_TABLET | Freq: Four times a day (QID) | ORAL | Status: DC | PRN

## 2012-04-11 MED ORDER — ONDANSETRON HCL 4 MG/2ML IJ SOLN
INTRAMUSCULAR | Status: DC | PRN
  Administered 2012-04-11: 4 mg via INTRAVENOUS

## 2012-04-11 MED ORDER — HYDROMORPHONE HCL PF 1 MG/ML IJ SOLN: 0.2500 mg | INTRAMUSCULAR | Status: DC | PRN

## 2012-04-11 MED ORDER — LACTATED RINGERS IV SOLN
INTRAVENOUS | Status: DC
  Administered 2012-04-11: 13:00:00 via INTRAVENOUS

## 2012-04-11 MED ORDER — PROPOFOL 10 MG/ML IV EMUL
INTRAVENOUS | Status: DC | PRN
  Administered 2012-04-11: 100 ug/kg/min via INTRAVENOUS

## 2012-04-11 MED ORDER — OXYCODONE HCL 5 MG PO TABS: 5.0000 mg | ORAL_TABLET | Freq: Once | ORAL | Status: DC | PRN

## 2012-04-11 MED ORDER — CHLORHEXIDINE GLUCONATE 4 % EX LIQD: 60.0000 mL | Freq: Once | CUTANEOUS | Status: DC

## 2012-04-11 MED ORDER — LIDOCAINE HCL (CARDIAC) 20 MG/ML IV SOLN
INTRAVENOUS | Status: DC | PRN
  Administered 2012-04-11: 15 mg via INTRAVENOUS

## 2012-04-11 MED ORDER — LIDOCAINE-EPINEPHRINE (PF) 1 %-1:200000 IJ SOLN
INTRAMUSCULAR | Status: DC | PRN
  Administered 2012-04-11: 14:00:00

## 2012-04-11 SURGICAL SUPPLY — 50 items
BANDAGE COBAN STERILE 2 (GAUZE/BANDAGES/DRESSINGS) IMPLANT
BANDAGE GAUZE ELAST BULKY 4 IN (GAUZE/BANDAGES/DRESSINGS) ×1 IMPLANT
BLADE MINI RND TIP GREEN BEAV (BLADE) IMPLANT
BLADE SURG 15 STRL LF DISP TIS (BLADE) ×1 IMPLANT
BLADE SURG 15 STRL SS (BLADE) ×2
BNDG CMPR 9X4 STRL LF SNTH (GAUZE/BANDAGES/DRESSINGS)
BNDG COHESIVE 1X5 TAN STRL LF (GAUZE/BANDAGES/DRESSINGS) IMPLANT
BNDG COHESIVE 3X5 TAN STRL LF (GAUZE/BANDAGES/DRESSINGS) ×1 IMPLANT
BNDG ESMARK 4X9 LF (GAUZE/BANDAGES/DRESSINGS) IMPLANT
CHLORAPREP W/TINT 26ML (MISCELLANEOUS) ×2 IMPLANT
CLOTH BEACON ORANGE TIMEOUT ST (SAFETY) ×2 IMPLANT
CORDS BIPOLAR (ELECTRODE) ×1 IMPLANT
COVER MAYO STAND STRL (DRAPES) ×2 IMPLANT
COVER TABLE BACK 60X90 (DRAPES) ×2 IMPLANT
CUFF TOURNIQUET SINGLE 18IN (TOURNIQUET CUFF) IMPLANT
DECANTER SPIKE VIAL GLASS SM (MISCELLANEOUS) IMPLANT
DRAIN PENROSE 1/2X12 LTX STRL (WOUND CARE) IMPLANT
DRAPE EXTREMITY T 121X128X90 (DRAPE) ×2 IMPLANT
DRAPE SURG 17X23 STRL (DRAPES) ×2 IMPLANT
GAUZE XEROFORM 1X8 LF (GAUZE/BANDAGES/DRESSINGS) ×2 IMPLANT
GLOVE BIO SURGEON STRL SZ 6.5 (GLOVE) ×2 IMPLANT
GLOVE BIOGEL PI IND STRL 8.5 (GLOVE) ×1 IMPLANT
GLOVE BIOGEL PI INDICATOR 8.5 (GLOVE) ×1
GLOVE SURG ORTHO 8.0 STRL STRW (GLOVE) ×2 IMPLANT
GOWN BRE IMP PREV XXLGXLNG (GOWN DISPOSABLE) ×2 IMPLANT
GOWN PREVENTION PLUS XLARGE (GOWN DISPOSABLE) ×2 IMPLANT
NDL SAFETY ECLIPSE 18X1.5 (NEEDLE) ×1 IMPLANT
NEEDLE 27GAX1X1/2 (NEEDLE) ×1 IMPLANT
NEEDLE HYPO 18GX1.5 SHARP (NEEDLE)
NS IRRIG 1000ML POUR BTL (IV SOLUTION) ×2 IMPLANT
PACK BASIN DAY SURGERY FS (CUSTOM PROCEDURE TRAY) ×2 IMPLANT
PAD CAST 3X4 CTTN HI CHSV (CAST SUPPLIES) IMPLANT
PADDING CAST ABS 3INX4YD NS (CAST SUPPLIES)
PADDING CAST ABS 4INX4YD NS (CAST SUPPLIES)
PADDING CAST ABS COTTON 3X4 (CAST SUPPLIES) IMPLANT
PADDING CAST ABS COTTON 4X4 ST (CAST SUPPLIES) ×1 IMPLANT
PADDING CAST COTTON 3X4 STRL (CAST SUPPLIES)
SPLINT PLASTER CAST XFAST 3X15 (CAST SUPPLIES) IMPLANT
SPLINT PLASTER XTRA FASTSET 3X (CAST SUPPLIES)
SPONGE GAUZE 4X4 12PLY (GAUZE/BANDAGES/DRESSINGS) ×2 IMPLANT
STOCKINETTE 4X48 STRL (DRAPES) ×2 IMPLANT
SUT VIC AB 4-0 P2 18 (SUTURE) IMPLANT
SUT VICRYL 3-0 RB1 (SUTURE) ×1 IMPLANT
SUT VICRYL RAPID 5 0 P 3 (SUTURE) IMPLANT
SUT VICRYL RAPIDE 4/0 PS 2 (SUTURE) ×2 IMPLANT
SYR BULB 3OZ (MISCELLANEOUS) ×2 IMPLANT
SYR CONTROL 10ML LL (SYRINGE) ×1 IMPLANT
TOWEL OR 17X24 6PK STRL BLUE (TOWEL DISPOSABLE) ×3 IMPLANT
UNDERPAD 30X30 INCONTINENT (UNDERPADS AND DIAPERS) ×2 IMPLANT
WATER STERILE IRR 1000ML POUR (IV SOLUTION) ×1 IMPLANT

## 2012-04-11 NOTE — Anesthesia Preprocedure Evaluation (Signed)
Anesthesia Evaluation  Patient identified by MRN, date of birth, ID band Patient awake    Reviewed: Allergy & Precautions, H&P , NPO status , Patient's Chart, lab work & pertinent test results  Airway Mallampati: II TM Distance: >3 FB Neck ROM: Full    Dental No notable dental hx. (+) Teeth Intact and Dental Advisory Given   Pulmonary asthma ,  breath sounds clear to auscultation  Pulmonary exam normal       Cardiovascular negative cardio ROS  Rhythm:Regular Rate:Normal     Neuro/Psych negative neurological ROS  negative psych ROS   GI/Hepatic negative GI ROS, Neg liver ROS,   Endo/Other  negative endocrine ROS  Renal/GU negative Renal ROS  negative genitourinary   Musculoskeletal   Abdominal   Peds  Hematology negative hematology ROS (+)   Anesthesia Other Findings   Reproductive/Obstetrics negative OB ROS                           Anesthesia Physical Anesthesia Plan  ASA: II  Anesthesia Plan: MAC   Post-op Pain Management:    Induction: Intravenous  Airway Management Planned: Mask  Additional Equipment:   Intra-op Plan:   Post-operative Plan:   Informed Consent: I have reviewed the patients History and Physical, chart, labs and discussed the procedure including the risks, benefits and alternatives for the proposed anesthesia with the patient or authorized representative who has indicated his/her understanding and acceptance.   Dental advisory given  Plan Discussed with: CRNA  Anesthesia Plan Comments:         Anesthesia Quick Evaluation

## 2012-04-11 NOTE — Anesthesia Procedure Notes (Addendum)
Performed by: Verlan Friends   Procedure Name: MAC Date/Time: 04/11/2012 2:04 PM Performed by: Verlan Friends Pre-anesthesia Checklist: Patient identified, Timeout performed, Emergency Drugs available, Suction available and Patient being monitored Patient Re-evaluated:Patient Re-evaluated prior to inductionOxygen Delivery Method: Simple face mask Placement Confirmation: positive ETCO2

## 2012-04-11 NOTE — Anesthesia Postprocedure Evaluation (Signed)
  Anesthesia Post-op Note  Patient: Anne [name]  Procedure(s) Performed: Procedure(s) (LRB) with comments: EXCISION MASS (Right) - Excision of mass right upper arm  Patient Location: PACU  Anesthesia Type:MAC  Level of Consciousness: awake and alert   Airway and Oxygen Therapy: Patient Spontanous Breathing  Post-op Pain: none  Post-op Assessment: Post-op Vital signs reviewed, Patient's Cardiovascular Status Stable, Respiratory Function Stable, Patent Airway and No signs of Nausea or vomiting  Post-op Vital Signs: Reviewed and stable  Complications: No apparent anesthesia complications

## 2012-04-11 NOTE — Brief Op Note (Signed)
04/11/2012  2:41 PM  PATIENT:  Anne [name]  79 y.o. female  PRE-OPERATIVE DIAGNOSIS:  mass right upper arm   POST-OPERATIVE DIAGNOSIS:  mass right upper arm   PROCEDURE:  Procedure(s) (LRB) with comments: EXCISION MASS (Right) - Excision of mass right upper arm  SURGEON:  Surgeon(s) and Role:    * Nicki Reaper, MD - Primary  PHYSICIAN ASSISTANT:   ASSISTANTS: none   ANESTHESIA:   local  EBL:  Total I/O In: 600 [I.V.:600] Out: -   BLOOD ADMINISTERED:none  DRAINS: none   LOCAL MEDICATIONS USED:  MARCAINE    and XYLOCAINE   SPECIMEN:  Excision  DISPOSITION OF SPECIMEN:  PATHOLOGY  COUNTS:  YES  TOURNIQUET:  * No tourniquets in log *  DICTATION: .Other Dictation: Dictation Number [id number]  PLAN OF CARE: Discharge to home after PACU  PATIENT DISPOSITION:  PACU - hemodynamically stable.

## 2012-04-11 NOTE — Op Note (Signed)
Dictation Number [id number]

## 2012-04-11 NOTE — H&P (Signed)
Anne [name] is a 79 year old right hand dominant female who comes in complaining of pain in her right elbow. This is on the medial aspect and has been going on for approximately 3 months. It began in June when she did a great deal of cabinet cleaning. She recalls no specific history of injury to the hand, neck or elbow. She is not awakened at night. She has taken Etodolac which has given her some relief but has returned. She has no history of prior injuries. No history of diabetes, thyroid problems, arthritis or gout. She has no history of similar symptoms on her left side. She complains of intermittent, mild, dull aching type pain occasionally going down to her fingers. States ice and heat have helped. She has tried wearing a brace. She continues to complain  of the mass on her upper right arm.  She is desirous of having this removed.  The injection to the medial epicondyle significantly relieves symptoms for her.  She is desirous of having the mass removed.    PAST MEDICAL HISTORY: She has no known drug allergies. She is on Advair. She has had a hysterectomy.  FAMILY H ISTORY: Positive for diabetes and high BP.  SOCIAL HISTORY: She does not smoke or drink. She is married and retired.  REVIEW OF SYSTEMS: Positive for glasses, asthma, otherwise negative for 14 points.  Anne [name] is an 79 y.o. female.   Chief Complaint: Mass rt arm  HPI: see above  Past Medical History  Diagnosis Date  . Hematuria   . Asthma   . Hyperlipidemia   . Allergic rhinitis   . Atypical chest pain   . Collapse of right lung   . Lung nodules   . Arthritis     Past Surgical History  Procedure Date  . Nasal sinus surgery   . Total abdominal hysterectomy   . Mediastinoscopy 4/11    Family History  Problem Relation Age of Onset  . Cancer Mother    Social History:  reports that she has never smoked. She has never used smokeless tobacco. She reports that she does not drink alcohol or use illicit  drugs.  Allergies:  Allergies  Allergen Reactions  . Shellfish Allergy Shortness Of Breath  . Iodine     No prescriptions prior to admission    No results found for this or any previous visit (from the past 48 hour(s)).  No results found.   Pertinent items are noted in HPI.  Height 5\' 7"  (1.702 m), weight 72.576 kg (160 lb).  General appearance: alert, cooperative and appears stated age Head: Normocephalic, without obvious abnormality Neck: no adenopathy Resp: clear to auscultation bilaterally Cardio: regular rate and rhythm, S1, S2 normal, no murmur, click, rub or gallop GI: soft, non-tender; bowel sounds normal; no masses,  no organomegaly Extremities: extremities normal, atraumatic, no cyanosis or edema Pulses: 2+ and symmetric Skin: Skin color, texture, turgor normal. No rashes or lesions Neurologic: Grossly normal Incision/Wound: na  Assessment/Plan: DX; soft tissue mass rt arm Removal mass rt arm She is aware of the possibility of infection, the possibility of recurrence, injury to arteries, nerves and tendons. This appears to be relatively superficial.  She will be scheduled as an outpatient for excision mass right upper arm.   Anne [name] R 04/11/2012, 9:36 AM

## 2012-04-11 NOTE — Transfer of Care (Signed)
Immediate Anesthesia Transfer of Care Note  Patient: Anne [name]  Procedure(s) Performed: Procedure(s) (LRB) with comments: EXCISION MASS (Right) - Excision of mass right upper arm  Patient Location: PACU  Anesthesia Type:MAC  Level of Consciousness: awake, alert , oriented and patient cooperative  Airway & Oxygen Therapy: Patient Spontanous Breathing and Patient connected to face mask oxygen  Post-op Assessment: Report given to PACU RN and Post -op Vital signs reviewed and stable  Post vital signs: Reviewed and stable  Complications: No apparent anesthesia complications

## 2012-04-12 NOTE — Op Note (Signed)
NAMEMarland Kitchen  Anne [name], Anne [name]                 ACCOUNT NO.:  [id number]  MEDICAL RECORD NO.:  [id number]  LOCATION:                                 FACILITY:  PHYSICIAN:  Cindee Salt, M.D.       DATE OF BIRTH:  07/23/1930  DATE OF PROCEDURE:  04/11/2012 DATE OF DISCHARGE:                              OPERATIVE REPORT   PREOPERATIVE DIAGNOSIS:  Mass, right upper arm.  POSTOPERATIVE DIAGNOSIS:  Mass, right upper arm.  OPERATION:  Excision of mass, right upper arm.  SURGEON:  Cindee Salt, MD  ANESTHESIA:  Local with sedation.  ANESTHESIOLOGIST:  Zenon Mayo, MD  HISTORY:  The patient is a 79 year old female with a history of a mass on her upper arm.  This was firm.  MRI is negative.  She is desirous having this excised and that we do not have any definitive diagnosis. It has slowly enlarged.  It is mildly painful for her.  Pre, peri, postoperative diagnosis had been discussed.  She is aware that there is no guarantee with the surgery; possibility of infection; recurrence of injury to arteries, nerves, tendons; incomplete relief of symptoms; dystrophy that this may be more the benign tumor.  In the preoperative area, the patient was seen and the extremity marked by both the patient and surgeon.  Antibiotic given.  DESCRIPTION OF PROCEDURE:  The patient was brought to the operating room where she was prepped and draped using ChloraPrep in supine position with the right arm free.  Sedation was given.  A local infiltration given with 0.25% Marcaine and 1% Xylocaine with epinephrine.  Marcaine was without epinephrine.  A longitudinal incision was made directly over the mass, carried down through subcutaneous tissue.  This was firm.  It was not well localized or encapsulated.  With blunt and sharp dissection, this was dissected free and sent to Pathology.  It measured approximately 5 cm in length and 3 x 3 cm in width.  The wound was copiously irrigated with saline.  The defect was  closed with interrupted 3-0 Vicryl sutures and the skin with interrupted 3-0 Vicryl Rapide.  A sterile compressive dressing was applied.  No tourniquet was used throughout the procedure.  The patient tolerated the procedure well and was taken to the recovery room for observation in satisfactory condition.  She will be discharged home to return in 1 week on Vicodin.         ______________________________ Cindee Salt, M.D.    GK/MEDQ  D:  04/11/2012  T:  04/12/2012  Job:  [id number]

## 2012-04-14 ENCOUNTER — Encounter (HOSPITAL_BASED_OUTPATIENT_CLINIC_OR_DEPARTMENT_OTHER): Payer: Self-pay | Admitting: Orthopedic Surgery

## 2012-05-10 ENCOUNTER — Emergency Department (HOSPITAL_COMMUNITY): Payer: Medicare Other

## 2012-05-10 ENCOUNTER — Emergency Department (HOSPITAL_COMMUNITY)
Admission: EM | Admit: 2012-05-10 | Discharge: 2012-05-10 | Disposition: A | Discharge status: Home / Self Care | Payer: Medicare Other | Attending: Emergency Medicine | Admitting: Emergency Medicine

## 2012-05-10 DIAGNOSIS — Z8709 Personal history of other diseases of the respiratory system: Secondary | ICD-10-CM | POA: Insufficient documentation

## 2012-05-10 DIAGNOSIS — IMO0002 Reserved for concepts with insufficient information to code with codable children: Secondary | ICD-10-CM | POA: Insufficient documentation

## 2012-05-10 DIAGNOSIS — J45909 Unspecified asthma, uncomplicated: Secondary | ICD-10-CM | POA: Insufficient documentation

## 2012-05-10 DIAGNOSIS — J069 Acute upper respiratory infection, unspecified: Secondary | ICD-10-CM

## 2012-05-10 DIAGNOSIS — J3489 Other specified disorders of nose and nasal sinuses: Secondary | ICD-10-CM | POA: Insufficient documentation

## 2012-05-10 DIAGNOSIS — R0989 Other specified symptoms and signs involving the circulatory and respiratory systems: Secondary | ICD-10-CM | POA: Insufficient documentation

## 2012-05-10 DIAGNOSIS — Z8742 Personal history of other diseases of the female genital tract: Secondary | ICD-10-CM | POA: Insufficient documentation

## 2012-05-10 DIAGNOSIS — J309 Allergic rhinitis, unspecified: Secondary | ICD-10-CM | POA: Insufficient documentation

## 2012-05-10 DIAGNOSIS — J029 Acute pharyngitis, unspecified: Secondary | ICD-10-CM | POA: Insufficient documentation

## 2012-05-10 DIAGNOSIS — M129 Arthropathy, unspecified: Secondary | ICD-10-CM | POA: Insufficient documentation

## 2012-05-10 DIAGNOSIS — Z79899 Other long term (current) drug therapy: Secondary | ICD-10-CM | POA: Insufficient documentation

## 2012-05-10 DIAGNOSIS — E785 Hyperlipidemia, unspecified: Secondary | ICD-10-CM | POA: Insufficient documentation

## 2012-05-10 MED ORDER — PREDNISONE 20 MG PO TABS: ORAL_TABLET | ORAL | Status: DC

## 2012-05-10 MED ORDER — ALBUTEROL SULFATE HFA 108 (90 BASE) MCG/ACT IN AERS: 2.0000 | INHALATION_SPRAY | RESPIRATORY_TRACT | Status: DC | PRN

## 2012-05-10 MED ORDER — ALBUTEROL SULFATE (5 MG/ML) 0.5% IN NEBU
5.0000 mg | INHALATION_SOLUTION | Freq: Once | RESPIRATORY_TRACT | Status: AC
  Administered 2012-05-10: 5 mg via RESPIRATORY_TRACT
  Filled 2012-05-10: qty 1

## 2012-05-10 MED ORDER — PREDNISONE 20 MG PO TABS
60.0000 mg | ORAL_TABLET | Freq: Once | ORAL | Status: AC
  Administered 2012-05-10: 60 mg via ORAL
  Filled 2012-05-10: qty 3

## 2012-05-10 MED ORDER — IPRATROPIUM BROMIDE 0.02 % IN SOLN
0.5000 mg | Freq: Once | RESPIRATORY_TRACT | Status: AC
  Administered 2012-05-10: 0.5 mg via RESPIRATORY_TRACT
  Filled 2012-05-10: qty 2.5

## 2012-05-10 MED ORDER — CETIRIZINE-PSEUDOEPHEDRINE ER 5-120 MG PO TB12: 1.0000 | ORAL_TABLET | Freq: Two times a day (BID) | ORAL | Status: DC | PRN

## 2012-05-10 NOTE — ED Notes (Signed)
Pt c/o throat itching on Monday, congestion, and wheezing at night. Pt states her throat is no longer itching, but she still has congestion and a productive cough with clear sputum. Pt denies any fevers at home.

## 2012-05-10 NOTE — ED Notes (Signed)
Pt reports taking a breathing treatment just PTA that has helped her a lot.

## 2012-05-10 NOTE — ED Notes (Signed)
RT called for treatment. Heather, RT notified and en route.

## 2012-05-10 NOTE — ED Provider Notes (Signed)
History     CSN: [id number]  Arrival date & time 05/10/12  1105   First MD Initiated Contact with Patient 05/10/12 1151      Chief Complaint  Patient presents with  . Cough  . Nasal Congestion  . throat itching     (Consider location/radiation/quality/duration/timing/severity/associated sxs/prior treatment) Patient is a 79 y.o. female presenting with cough. The history is provided by the patient.  Cough Associated symptoms: wheezing   Associated symptoms: no chest pain, no chills, no fever, no headaches and no rash   pt w hx asthma, c/o prod cough, nasal congestion, chest congestion, for the past few days. Throat was scratchy/sore, but that has improved. Mild wheezing/sob. No chest pain or discomfort. Denies fever or chills. No known ill contacts. w her asthma, states uses mdi tx only prn, no prior admissions for same. No leg pain or swelling. No hx dvt or pe. No orthopnea or pnd.   Past Medical History  Diagnosis Date  . Hematuria   . Asthma   . Hyperlipidemia   . Allergic rhinitis   . Atypical chest pain   . Collapse of right lung   . Lung nodules   . Arthritis     Past Surgical History  Procedure Laterality Date  . Nasal sinus surgery    . Total abdominal hysterectomy    . Mediastinoscopy  4/11  . Mass excision  04/11/2012    Procedure: EXCISION MASS;  Surgeon: Nicki Reaper, MD;  Location: [facility];  Service: Orthopedics;  Laterality: Right;  Excision of mass right upper arm    Family History  Problem Relation Age of Onset  . Cancer Mother     History  Substance Use Topics  . Smoking status: Never Smoker   . Smokeless tobacco: Never Used  . Alcohol Use: No    OB History   Grav Para Term Preterm Abortions TAB SAB Ect Mult Living                  Review of Systems  Constitutional: Negative for fever and chills.  HENT: Negative for neck pain.   Eyes: Negative for redness.  Respiratory: Positive for cough and wheezing.   Cardiovascular:  Negative for chest pain and leg swelling.  Gastrointestinal: Negative for abdominal pain.  Genitourinary: Negative for flank pain.  Musculoskeletal: Negative for back pain.  Skin: Negative for rash.  Neurological: Negative for headaches.  Hematological: Does not bruise/bleed easily.  Psychiatric/Behavioral: Negative for confusion.    Allergies  Shellfish allergy and Iodine  Home Medications   Current Outpatient Rx  Name  Route  Sig  Dispense  Refill  . albuterol (PROVENTIL HFA;VENTOLIN HFA) 108 (90 BASE) MCG/ACT inhaler   Inhalation   Inhale 2 puffs into the lungs every 6 (six) hours as needed for wheezing.         . calcium carbonate 200 MG capsule   Oral   Take 250 mg by mouth at bedtime.          . cetirizine (ZYRTEC) 10 MG tablet   Oral   Take 10 mg by mouth every morning.         . ferrous fumarate (HEMOCYTE - 106 MG FE) 325 (106 FE) MG TABS   Oral   Take 1 tablet by mouth every morning.          . Fluticasone-Salmeterol (ADVAIR) 100-50 MCG/DOSE AEPB   Inhalation   Inhale 1 puff into the lungs every 12 (twelve)  hours.         . Multiple Vitamin (MULTIVITAMIN) tablet   Oral   Take 1 tablet by mouth daily.         Marland Kitchen omeprazole (PRILOSEC) 20 MG capsule   Oral   Take 1 tablet by mouth daily as needed (acid reflux).            BP 145/47  Pulse 88  Temp(Src) 98.7 F (37.1 C) (Oral)  Resp 18  Ht 5' 7.5" (1.715 m)  Wt 155 lb (70.308 kg)  BMI 23.9 kg/m2  SpO2 97%  Physical Exam  Nursing note and vitals reviewed. Constitutional: She appears well-developed and well-nourished. No distress.  HENT:  Mouth/Throat: Oropharynx is clear and moist.  Mild nasal congestion. Clear flui behind left tm no acute om  Eyes: Conjunctivae are normal. No scleral icterus.  Neck: Neck supple. No tracheal deviation present.  No stiffness  Cardiovascular: Normal rate, regular rhythm, normal heart sounds and intact distal pulses.  Exam reveals no gallop and no  friction rub.   No murmur heard. Pulmonary/Chest: Effort normal. No respiratory distress. She has wheezes.  Mild upper resp congestion, exp wheezing  Abdominal: Soft. Normal appearance and bowel sounds are normal. She exhibits no distension. There is no tenderness.  Musculoskeletal: She exhibits no edema and no tenderness.  Neurological: She is alert.  Skin: Skin is warm and dry. No rash noted.  Psychiatric: She has a normal mood and affect.    ED Course  Procedures (including critical care time)  Dg Chest 2 View  05/10/2012  *RADIOLOGY REPORT*  Clinical Data: Short of breath, cough, wheezing  CHEST - 2 VIEW  Comparison: Chest x-ray of 07/25/2009  Findings: No active infiltrate or effusion is seen.  The lungs however are hyperaerated consistent with COPD.  Mediastinal contours are stable.  The heart is within upper limits of normal. There are mild degenerative changes of the thoracic spine.  IMPRESSION: No active lung disease.  Hyperaeration consistent with COPD.   Original Report Authenticated By: Dwyane Dee, M.D.       MDM  Albuterol and atrovent neb. Cxr.   Reviewed nursing notes and prior charts for additional history.    No pna no cxr. For congestion, will give zyrtec d.  For wheezing/asthma, brief course pred, and albuterol.  Recheck no wheezing or increased wob. Pt appears stable for d/c.        Suzi Roots, MD 05/10/12 1326

## 2012-05-28 ENCOUNTER — Ambulatory Visit
Admission: RE | Admit: 2012-05-28 | Discharge: 2012-05-28 | Disposition: A | Discharge status: Home / Self Care | Payer: Medicare Other | Source: Ambulatory Visit | Attending: Allergy and Immunology | Admitting: Allergy and Immunology

## 2012-05-28 ENCOUNTER — Other Ambulatory Visit: Payer: Self-pay | Admitting: Allergy and Immunology

## 2012-05-28 ENCOUNTER — Other Ambulatory Visit: Payer: Self-pay | Admitting: Obstetrics and Gynecology

## 2012-05-28 DIAGNOSIS — Z1231 Encounter for screening mammogram for malignant neoplasm of breast: Secondary | ICD-10-CM

## 2012-05-28 DIAGNOSIS — R52 Pain, unspecified: Secondary | ICD-10-CM

## 2012-05-31 ENCOUNTER — Encounter (HOSPITAL_COMMUNITY): Payer: Self-pay | Admitting: Emergency Medicine

## 2012-05-31 ENCOUNTER — Emergency Department (HOSPITAL_COMMUNITY)
Admission: EM | Admit: 2012-05-31 | Discharge: 2012-05-31 | Disposition: A | Discharge status: Home / Self Care | Payer: Medicare Other | Attending: Emergency Medicine | Admitting: Emergency Medicine

## 2012-05-31 DIAGNOSIS — Z79899 Other long term (current) drug therapy: Secondary | ICD-10-CM | POA: Insufficient documentation

## 2012-05-31 DIAGNOSIS — J45909 Unspecified asthma, uncomplicated: Secondary | ICD-10-CM | POA: Insufficient documentation

## 2012-05-31 DIAGNOSIS — Z90710 Acquired absence of both cervix and uterus: Secondary | ICD-10-CM | POA: Insufficient documentation

## 2012-05-31 DIAGNOSIS — Z8639 Personal history of other endocrine, nutritional and metabolic disease: Secondary | ICD-10-CM | POA: Insufficient documentation

## 2012-05-31 DIAGNOSIS — K219 Gastro-esophageal reflux disease without esophagitis: Secondary | ICD-10-CM | POA: Insufficient documentation

## 2012-05-31 DIAGNOSIS — R0789 Other chest pain: Secondary | ICD-10-CM | POA: Insufficient documentation

## 2012-05-31 DIAGNOSIS — Z8742 Personal history of other diseases of the female genital tract: Secondary | ICD-10-CM | POA: Insufficient documentation

## 2012-05-31 DIAGNOSIS — Z862 Personal history of diseases of the blood and blood-forming organs and certain disorders involving the immune mechanism: Secondary | ICD-10-CM | POA: Insufficient documentation

## 2012-05-31 DIAGNOSIS — Z8709 Personal history of other diseases of the respiratory system: Secondary | ICD-10-CM | POA: Insufficient documentation

## 2012-05-31 DIAGNOSIS — IMO0002 Reserved for concepts with insufficient information to code with codable children: Secondary | ICD-10-CM | POA: Insufficient documentation

## 2012-05-31 DIAGNOSIS — R079 Chest pain, unspecified: Secondary | ICD-10-CM

## 2012-05-31 LAB — CBC WITH DIFFERENTIAL/PLATELET
Eosinophils Relative: 2 % (ref 0–5)
Hemoglobin: 11.5 g/dL — ABNORMAL LOW (ref 12.0–15.0)
Lymphocytes Relative: 33 % (ref 12–46)
Lymphs Abs: 2.4 10*3/uL (ref 0.7–4.0)
MCV: 90.2 fL (ref 78.0–100.0)
Monocytes Relative: 9 % (ref 3–12)
Platelets: 299 10*3/uL (ref 150–400)
RBC: 3.87 MIL/uL (ref 3.87–5.11)
WBC: 7.1 10*3/uL (ref 4.0–10.5)

## 2012-05-31 LAB — COMPREHENSIVE METABOLIC PANEL
ALT: 17 U/L (ref 0–35)
AST: 17 U/L (ref 0–37)
CO2: 29 mEq/L (ref 19–32)
Chloride: 103 mEq/L (ref 96–112)
Creatinine, Ser: 0.88 mg/dL (ref 0.50–1.10)
GFR calc non Af Amer: 61 mL/min — ABNORMAL LOW (ref >90)
Sodium: 140 mEq/L (ref 135–145)
Total Bilirubin: 0.5 mg/dL (ref 0.3–1.2)

## 2012-05-31 LAB — TROPONIN I: Troponin I: <0.3 ng/mL (ref <0.30)

## 2012-05-31 MED ORDER — FAMOTIDINE IN NACL 20-0.9 MG/50ML-% IV SOLN
20.0000 mg | Freq: Once | INTRAVENOUS | Status: AC
  Administered 2012-05-31: 20 mg via INTRAVENOUS
  Filled 2012-05-31: qty 50

## 2012-05-31 MED ORDER — SODIUM CHLORIDE 0.9 % IV BOLUS (SEPSIS)
1000.0000 mL | Freq: Once | INTRAVENOUS | Status: AC
  Administered 2012-05-31: 1000 mL via INTRAVENOUS

## 2012-05-31 MED ORDER — OMEPRAZOLE 20 MG PO CPDR: 40.0000 mg | DELAYED_RELEASE_CAPSULE | Freq: Every day | ORAL | Status: DC

## 2012-05-31 MED ORDER — GI COCKTAIL ~~LOC~~
30.0000 mL | Freq: Once | ORAL | Status: AC
  Administered 2012-05-31: 30 mL via ORAL
  Filled 2012-05-31: qty 30

## 2012-05-31 NOTE — ED Notes (Signed)
She c/o central chest "heaviness" x 3-4 days.  She states she was seen here in early Feb. For cough/bronchospasm issues and was dx with "bronchitis". She states she has had long-term issues with asthma; and is currently on Advair, which she used this morning.  Her skin is normal, warm and dry and she is breathing normally. ECG performed at triage. She denies any diaphoresis/n/v, nor any unusual shortness of breath.  She states for a brief (~10 min.) interval yesterday evening, she felt "weak and real shaky".

## 2012-05-31 NOTE — ED Provider Notes (Signed)
History     CSN: [id number]  Arrival date & time 05/31/12  0905   First MD Initiated Contact with Patient 05/31/12 [phone]      Chief Complaint  Patient presents with  . Chest Pain    (Consider location/radiation/quality/duration/timing/severity/associated sxs/prior treatment) The history is provided by the patient.  Anne [name] is a 79 y.o. female history of asthma, hyperlipidemia here presenting with chest pain. Chest pain for the last 3-4 days. It is present when she lays down. It is sense of burning sensation that travels up to her throat. Denies any fevers or chills. She was here about a month ago and was diagnosed with asthma exacerbation and finish her course of steroids and is on albuterol prn. No ab pain or nausea or vomiting. Denies any cardiac history and only cardiac risk factors are age and hyperlipidemia.   Past Medical History  Diagnosis Date  . Hematuria   . Asthma   . Hyperlipidemia   . Allergic rhinitis   . Atypical chest pain   . Collapse of right lung   . Lung nodules   . Arthritis     Past Surgical History  Procedure Laterality Date  . Nasal sinus surgery    . Total abdominal hysterectomy    . Mediastinoscopy  4/11  . Mass excision  04/11/2012    Procedure: EXCISION MASS;  Surgeon: Nicki Reaper, MD;  [facility];  Service: Orthopedics;  Laterality: Right;  Excision of mass right upper arm    Family History  Problem Relation Age of Onset  . Cancer Mother     History  Substance Use Topics  . Smoking status: Never Smoker   . Smokeless tobacco: Never Used  . Alcohol Use: No    OB History   Grav Para Term Preterm Abortions TAB SAB Ect Mult Living                  Review of Systems  Cardiovascular: Positive for chest pain.  All other systems reviewed and are negative.    Allergies  Shellfish allergy and Iodine  Home Medications   Current Outpatient Rx  Name  Route  Sig  Dispense  Refill  . albuterol (PROVENTIL  HFA;VENTOLIN HFA) 108 (90 BASE) MCG/ACT inhaler   Inhalation   Inhale 2 puffs into the lungs every 6 (six) hours as needed for wheezing.         . calcium carbonate 200 MG capsule   Oral   Take 200 mg by mouth at bedtime.          . cetirizine (ZYRTEC) 10 MG tablet   Oral   Take 10 mg by mouth every morning.         . ferrous fumarate (HEMOCYTE - 106 MG FE) 325 (106 FE) MG TABS   Oral   Take 1 tablet by mouth every morning.          . Fluticasone-Salmeterol (ADVAIR) 100-50 MCG/DOSE AEPB   Inhalation   Inhale 1 puff into the lungs every 12 (twelve) hours.         . Multiple Vitamin (MULTIVITAMIN) tablet   Oral   Take 1 tablet by mouth daily.         Marland Kitchen omeprazole (PRILOSEC) 20 MG capsule   Oral   Take 1 tablet by mouth daily as needed (acid reflux).            BP 154/59  Pulse 80  Temp(Src) 98 F (  36.7 C) (Oral)  Resp 18  Ht 5' 7.5" (1.715 m)  Wt 155 lb (70.308 kg)  BMI 23.9 kg/m2  SpO2 100%  Physical Exam  Nursing note and vitals reviewed. Constitutional: She is oriented to person, place, and time. She appears well-developed and well-nourished.  Anxious   HENT:  Head: Normocephalic.  Mouth/Throat: Oropharynx is clear and moist.  Eyes: Conjunctivae are normal. Pupils are equal, round, and reactive to light.  Neck: Normal range of motion. Neck supple.  Cardiovascular: Normal rate, regular rhythm and normal heart sounds.   Pulmonary/Chest: Effort normal and breath sounds normal. No respiratory distress. She has no wheezes. She has no rales.  Abdominal: Soft. Bowel sounds are normal. She exhibits no distension. There is no tenderness. There is no rebound and no guarding.  Musculoskeletal: Normal range of motion.  Neurological: She is alert and oriented to person, place, and time.  Skin: Skin is warm and dry.  Psychiatric: She has a normal mood and affect. Her behavior is normal. Judgment and thought content normal.    ED Course  Procedures  (including critical care time)  Labs Reviewed  CBC WITH DIFFERENTIAL - Abnormal; Notable for the following:    Hemoglobin 11.5 (*)    HCT 34.9 (*)    All other components within normal limits  COMPREHENSIVE METABOLIC PANEL - Abnormal; Notable for the following:    Glucose, Bld 60 (*)    Albumin 3.3 (*)    GFR calc non Af Amer 61 (*)    GFR calc Af Amer 71 (*)    All other components within normal limits  GLUCOSE, CAPILLARY - Abnormal; Notable for the following:    Glucose-Capillary 104 (*)    All other components within normal limits  TROPONIN I  LIPASE, BLOOD  TROPONIN I   No results found.   No diagnosis found.   Date: 05/31/2012  Rate: 76  Rhythm: normal sinus rhythm  QRS Axis: normal  Intervals: normal  ST/T Wave abnormalities: normal  Conduction Disutrbances:none  Narrative Interpretation:   Old EKG Reviewed: unchanged     MDM  Anne [name] is a 79 y.o. female hx of HL here with chest pain. Chest pain atypical for ACS and more likely to be reflux. Will check trop x 2 given age and HL. She had CXR 4 days ago outpatient that was normal. I doubt asthma exacerbation as she is not wheezing. Will give GI cocktail and reassess.   1:07 PM Felt better after GI cocktail. Labs showed glucose 60, after eating CBG was 104. Trop neg x 2. I think she likely has reflux. Will restart prilosec 40mg  daily and pepcid prn. She has f/u in 2 days.        Richardean Canal, MD 05/31/12 1308

## 2012-06-26 ENCOUNTER — Ambulatory Visit (HOSPITAL_COMMUNITY): Payer: Medicare Other

## 2012-07-10 ENCOUNTER — Other Ambulatory Visit: Payer: Self-pay | Admitting: Gastroenterology

## 2012-07-14 ENCOUNTER — Ambulatory Visit (HOSPITAL_COMMUNITY)
Admission: RE | Admit: 2012-07-14 | Discharge: 2012-07-14 | Disposition: A | Discharge status: Home / Self Care | Payer: Medicare Other | Source: Ambulatory Visit | Attending: Obstetrics and Gynecology | Admitting: Obstetrics and Gynecology

## 2012-07-14 DIAGNOSIS — Z1231 Encounter for screening mammogram for malignant neoplasm of breast: Secondary | ICD-10-CM

## 2012-08-18 ENCOUNTER — Encounter: Payer: Self-pay | Admitting: Neurology

## 2012-08-18 ENCOUNTER — Ambulatory Visit (INDEPENDENT_AMBULATORY_CARE_PROVIDER_SITE_OTHER): Payer: Medicare Other | Admitting: Neurology

## 2012-08-18 VITALS — BP 129/62 | HR 76 | Ht 68.0 in | Wt 164.0 lb

## 2012-08-18 DIAGNOSIS — M79609 Pain in unspecified limb: Secondary | ICD-10-CM | POA: Insufficient documentation

## 2012-08-18 MED ORDER — MELOXICAM 15 MG PO TABS: 15.0000 mg | ORAL_TABLET | Freq: Every day | ORAL | Status: DC

## 2012-08-18 NOTE — Patient Instructions (Signed)
Take Mobic with food. I think you have medial epichondylitis.

## 2012-08-18 NOTE — Progress Notes (Signed)
Reason for visit: Arm pain  Anne [name] is a 79 y.o. female  History of present illness:  Anne [name] is a 79 year old right-handed black female with a history of right arm discomfort that has been present since approximately one year ago. The patient indicates that the arm pain came on after she was varnishing the cabinets in her kitchen. The patient has noted pain in the medial aspect of the elbow, but within the last several weeks, she has had some discomfort that has gone down into the forearm. The patient denies any true numbness or tingly sensations. Nerve conduction studies of the right arm have been unremarkable. The patient will take an occasional Advil for the pain, with some benefit. The patient indicates that the pain is not present at all times, but she will note certain activities that will bring on the pain. The patient denies any neck discomfort or pain down the arm. The patient denies any discomfort with the left arm, or with the lower extremities. The patient does have some constipation issues and occasional urinary frequency. The patient is sent to this office for an evaluation.  Past Medical History  Diagnosis Date  . Hematuria   . Asthma   . Hyperlipidemia   . Allergic rhinitis   . Atypical chest pain   . Collapse of right lung   . Lung nodules   . Arthritis   . Glaucoma   . Gastroesophageal reflux disease   . Dyslipidemia     Past Surgical History  Procedure Laterality Date  . Nasal sinus surgery    . Total abdominal hysterectomy    . Mediastinoscopy  4/11  . Mass excision  04/11/2012    Procedure: EXCISION MASS;  Surgeon: Nicki Reaper, MD;  Location: [facility];  Service: Orthopedics;  Laterality: Right;  Excision of mass right upper arm  . Cataract extraction Bilateral   . Anal fissure repair      Family History  Problem Relation Age of Onset  . Cancer Mother   . Heart disease Father     Social history:  reports that she has quit  smoking. She has never used smokeless tobacco. She reports that she does not drink alcohol or use illicit drugs.  Medications:  Current Outpatient Prescriptions on File Prior to Visit  Medication Sig Dispense Refill  . albuterol (PROVENTIL HFA;VENTOLIN HFA) 108 (90 BASE) MCG/ACT inhaler Inhale 2 puffs into the lungs every 6 (six) hours as needed for wheezing.      . calcium carbonate 200 MG capsule Take 200 mg by mouth at bedtime.       . cetirizine (ZYRTEC) 10 MG tablet Take 10 mg by mouth every morning.      . ferrous fumarate (HEMOCYTE - 106 MG FE) 325 (106 FE) MG TABS Take 1 tablet by mouth every morning.       . Fluticasone-Salmeterol (ADVAIR) 100-50 MCG/DOSE AEPB Inhale 1 puff into the lungs every 12 (twelve) hours.      . Multiple Vitamin (MULTIVITAMIN) tablet Take 1 tablet by mouth daily.      Marland Kitchen omeprazole (PRILOSEC) 20 MG capsule Take 1 tablet by mouth daily as needed (acid reflux).        No current facility-administered medications on file prior to visit.    Allergies:  Allergies  Allergen Reactions  . Shellfish Allergy Shortness Of Breath  . Iodine     ROS:  Out of a complete 14 system review of symptoms, the  patient complains only of the following symptoms, and all other reviewed systems are negative.  Constipation Frequent urination Allergies Easy bruising  Blood pressure 129/62, pulse 76, height 5\' 8"  (1.727 m), weight 164 lb (74.39 kg).  Physical Exam  General: The patient is alert and cooperative at the time of the examination.  Head: Pupils are equal, round, and reactive to light. Discs are flat bilaterally.  Neck: The neck is supple, no carotid bruits are noted.  Respiratory: The respiratory examination is clear.  Cardiovascular: The cardiovascular examination reveals a regular rate and rhythm, no obvious murmurs or rubs are noted.  Neuromuscular: The patient reports some point tenderness at the tendon insertion point at the medial epicondyle.  Skin:  Extremities are without significant edema.  Neurologic Exam  Mental status:  Cranial nerves: Facial symmetry is present. There is good sensation of the face to pinprick and soft touch bilaterally. The strength of the facial muscles and the muscles to head turning and shoulder shrug are normal bilaterally. Speech is well enunciated, no aphasia or dysarthria is noted. Extraocular movements are full. Visual fields are full.  Motor: The motor testing reveals 5 over 5 strength of all 4 extremities. Good symmetric motor tone is noted throughout.  Sensory: Sensory testing is intact to pinprick, soft touch, vibration sensation, and position sense on all 4 extremities. No evidence of extinction is noted.  Coordination: Cerebellar testing reveals good finger-nose-finger and heel-to-shin bilaterally.  Gait and station: Gait is normal. Tandem gait is normal. Romberg is negative. No drift is seen.  Reflexes: Deep tendon reflexes are symmetric and normal bilaterally. Toes are downgoing bilaterally.   Assessment/Plan:  1. 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 The pain is not present at all times. The patient is tender at the tendon insertion point of the medial epicondyle. The patient will be placed on Mobic, taking 15 mg daily for the next 2 months. If the patient has not gained improvement with this, she will contact our office. We may consider EMG evaluation of the right arm at that time. The clinical history given by the patient does not appear to be fully consistent with a neuropathic pain syndrome.  Anne Palau MD 08/18/2012 7:52 PM  [facility] [address] [place], [place] [address]  Phone [phone] Fax [phone]

## 2012-09-16 ENCOUNTER — Telehealth: Payer: Self-pay | Admitting: Neurology

## 2012-09-16 DIAGNOSIS — M4820 Kissing spine, site unspecified: Secondary | ICD-10-CM

## 2012-09-16 DIAGNOSIS — M79609 Pain in unspecified limb: Secondary | ICD-10-CM

## 2012-09-16 NOTE — Telephone Encounter (Signed)
Message copied by Stephanie Acre on Tue Sep 16, 2012  8:08 PM ------      Message from: Avie Echevaria      Created: Tue Sep 16, 2012  4:32 PM      Contact: PATIENT                   ----- Message -----         From: Esmond Harps         Sent: 09/15/2012   4:32 PM           To: [facility] Triage Pool            PT HAS 1 MORE REFILL ON MALOXICAM--NOT SURE IF SHE SHOULD HAVE IT REFILLED SINCE IT IS NOT HELPING--STILL HAVING SAME PROBLEMS ------

## 2012-09-16 NOTE — Telephone Encounter (Signed)
I called the patient. The patient is not improving on the Mobic. I will get her set up for EMG and nerve conduction study, and if this is unremarkable, I will refer her to an orthopedic surgeon for lateral epicondylitis.

## 2012-09-18 ENCOUNTER — Telehealth: Payer: Self-pay | Admitting: *Deleted

## 2012-09-18 NOTE — Telephone Encounter (Signed)
Spoke with patient and relayed Dr Clarisa Kindred plan of care.  I reviewed her future appointments with her and told her that if she was not contacted within the next 3 days, she should call us back.  Ms Kube understood and had no further questions.

## 2012-09-18 NOTE — Telephone Encounter (Signed)
Message copied by Avie Echevaria on Thu Sep 18, 2012 10:49 AM ------      Message from: Philipp Ovens R      Created: Wed Sep 17, 2012  4:41 PM      Contact: patient       Was not sure of the message that Dr. Anne Hahn left for her and needs some clarity ------

## 2012-09-18 NOTE — Telephone Encounter (Signed)
Called pt to repeat/review Dr Clarisa Kindred last call for clarification.  I left a vm with instructions for pt to call back.

## 2012-09-18 NOTE — Telephone Encounter (Signed)
Noted  

## 2012-10-07 ENCOUNTER — Encounter (INDEPENDENT_AMBULATORY_CARE_PROVIDER_SITE_OTHER): Payer: Medicare Other

## 2012-10-07 ENCOUNTER — Ambulatory Visit (INDEPENDENT_AMBULATORY_CARE_PROVIDER_SITE_OTHER): Payer: Medicare Other | Admitting: Neurology

## 2012-10-07 DIAGNOSIS — M79609 Pain in unspecified limb: Secondary | ICD-10-CM

## 2012-10-07 DIAGNOSIS — Z0289 Encounter for other administrative examinations: Secondary | ICD-10-CM

## 2012-10-07 NOTE — Procedures (Signed)
  HISTORY:  Anne [name] is a 79 year old patient with a several month history of right upper extremity discomfort, mainly at the medial elbow. This problem came on after she was working on cabinets in her kitchen. The patient has not responded to Motrin therapy, and she returns for EMG and nerve conduction studies to exclude the possibility of a neuropathy or a cervical radiculopathy. The patient denies neck or shoulder discomfort.  NERVE CONDUCTION STUDIES:  Nerve conduction studies were performed on the right upper extremity. The distal motor latencies and motor amplitudes for the median and ulnar nerves were within normal limits. The F wave latencies and nerve conduction velocities for these nerves were also normal. The sensory latencies for the median and ulnar nerves were normal.   EMG STUDIES:  EMG study was performed on the right upper extremity:  The first dorsal interosseous muscle reveals 2 to 4 K units with full recruitment. No fibrillations or positive waves were noted. The abductor pollicis brevis muscle reveals 2 to 4 K units with full recruitment. No fibrillations or positive waves were noted. The extensor indicis proprius muscle reveals 1 to 3 K units with full recruitment. No fibrillations or positive waves were noted. The pronator teres muscle reveals 2 to 4 K units with full recruitment. No fibrillations or positive waves were noted. The biceps muscle reveals 1 to 2 K units with full recruitment. No fibrillations or positive waves were noted. The triceps muscle reveals 2 to 4 K units with full recruitment. No fibrillations or positive waves were noted. The anterior deltoid muscle reveals 2 to 3 K units with full recruitment. No fibrillations or positive waves were noted. The cervical paraspinal muscles were tested at 2 levels. No abnormalities of insertional activity were seen at either level tested. There was poor relaxation.   IMPRESSION:  Nerve conduction studies done  on the right upper extremity were unremarkable. There is no evidence of a median or ulnar neuropathy. EMG evaluation of the right upper extremity is normal. There is no evidence of a right cervical radiculopathy on today's evaluation.  Marlan Palau MD 10/07/2012 10:45 AM  [facility] [address] [place], [place] [address]  Phone [phone] Fax [phone]

## 2012-10-07 NOTE — Progress Notes (Signed)
The patient returns today for EMG and nerve conduction study involving the right arm. She gained no benefit with the Mobic therapy. The patient indicates that she has seen Dr. Merlyn Lot in February 2014, and he gave her a cortisone injection in the medial epicondyle, which alleviated her symptoms for about 6 weeks. The patient has not received any further injections.  EMG and nerve conduction studies done on the right on today's is unremarkable. We will check blood work today looking for connective tissue disorders, but if this is negative, I would recommend that the patient return to Dr. Merlyn Lot for further therapy. The patient otherwise will followup through this office if needed.

## 2012-10-08 LAB — RHEUMATOID FACTOR: Rhuematoid fact SerPl-aCnc: <7 IU/mL (ref 0.0–13.9)

## 2013-06-19 ENCOUNTER — Other Ambulatory Visit: Payer: Self-pay | Admitting: Obstetrics and Gynecology

## 2013-06-19 DIAGNOSIS — Z1231 Encounter for screening mammogram for malignant neoplasm of breast: Secondary | ICD-10-CM

## 2013-07-21 ENCOUNTER — Ambulatory Visit (HOSPITAL_COMMUNITY): Payer: Medicare Other

## 2013-07-30 ENCOUNTER — Ambulatory Visit (HOSPITAL_COMMUNITY)
Admission: RE | Admit: 2013-07-30 | Discharge: 2013-07-30 | Disposition: A | Discharge status: Home / Self Care | Payer: 59 | Source: Ambulatory Visit | Attending: Obstetrics and Gynecology | Admitting: Obstetrics and Gynecology

## 2013-07-30 DIAGNOSIS — Z1231 Encounter for screening mammogram for malignant neoplasm of breast: Secondary | ICD-10-CM | POA: Insufficient documentation

## 2013-10-16 ENCOUNTER — Other Ambulatory Visit: Payer: Self-pay | Admitting: Orthopaedic Surgery

## 2013-10-16 DIAGNOSIS — M25561 Pain in right knee: Secondary | ICD-10-CM

## 2013-10-24 ENCOUNTER — Ambulatory Visit
Admission: RE | Admit: 2013-10-24 | Discharge: 2013-10-24 | Disposition: A | Discharge status: Home / Self Care | Payer: 59 | Source: Ambulatory Visit | Attending: Orthopaedic Surgery | Admitting: Orthopaedic Surgery

## 2013-10-24 DIAGNOSIS — M25561 Pain in right knee: Secondary | ICD-10-CM

## 2013-11-04 ENCOUNTER — Encounter: Payer: Self-pay | Admitting: *Deleted

## 2014-05-31 DIAGNOSIS — H0520 Unspecified exophthalmos: Secondary | ICD-10-CM | POA: Insufficient documentation

## 2014-05-31 DIAGNOSIS — H02403 Unspecified ptosis of bilateral eyelids: Secondary | ICD-10-CM | POA: Insufficient documentation

## 2014-05-31 DIAGNOSIS — H0264 Xanthelasma of left upper eyelid: Secondary | ICD-10-CM | POA: Insufficient documentation

## 2014-05-31 DIAGNOSIS — H409 Unspecified glaucoma: Secondary | ICD-10-CM | POA: Insufficient documentation

## 2014-07-12 ENCOUNTER — Other Ambulatory Visit (HOSPITAL_COMMUNITY): Payer: Self-pay | Admitting: Obstetrics and Gynecology

## 2014-07-12 DIAGNOSIS — Z1231 Encounter for screening mammogram for malignant neoplasm of breast: Secondary | ICD-10-CM

## 2014-08-10 ENCOUNTER — Ambulatory Visit (HOSPITAL_COMMUNITY)
Admission: RE | Admit: 2014-08-10 | Discharge: 2014-08-10 | Disposition: A | Discharge status: Home / Self Care | Payer: Medicare Other | Source: Ambulatory Visit | Attending: Obstetrics and Gynecology | Admitting: Obstetrics and Gynecology

## 2014-08-10 DIAGNOSIS — Z1231 Encounter for screening mammogram for malignant neoplasm of breast: Secondary | ICD-10-CM | POA: Diagnosis present

## 2014-08-30 ENCOUNTER — Emergency Department (HOSPITAL_COMMUNITY): Payer: Medicare Other

## 2014-08-30 ENCOUNTER — Encounter (HOSPITAL_COMMUNITY): Payer: Self-pay | Admitting: Emergency Medicine

## 2014-08-30 ENCOUNTER — Emergency Department (HOSPITAL_COMMUNITY)
Admission: EM | Admit: 2014-08-30 | Discharge: 2014-08-30 | Disposition: A | Discharge status: Home / Self Care | Payer: Medicare Other | Attending: Emergency Medicine | Admitting: Emergency Medicine

## 2014-08-30 DIAGNOSIS — R0789 Other chest pain: Secondary | ICD-10-CM | POA: Diagnosis not present

## 2014-08-30 DIAGNOSIS — Z79899 Other long term (current) drug therapy: Secondary | ICD-10-CM | POA: Insufficient documentation

## 2014-08-30 DIAGNOSIS — J45901 Unspecified asthma with (acute) exacerbation: Secondary | ICD-10-CM | POA: Diagnosis not present

## 2014-08-30 DIAGNOSIS — K219 Gastro-esophageal reflux disease without esophagitis: Secondary | ICD-10-CM | POA: Insufficient documentation

## 2014-08-30 DIAGNOSIS — Z8669 Personal history of other diseases of the nervous system and sense organs: Secondary | ICD-10-CM | POA: Diagnosis not present

## 2014-08-30 DIAGNOSIS — R0600 Dyspnea, unspecified: Secondary | ICD-10-CM

## 2014-08-30 DIAGNOSIS — M1990 Unspecified osteoarthritis, unspecified site: Secondary | ICD-10-CM | POA: Insufficient documentation

## 2014-08-30 DIAGNOSIS — R079 Chest pain, unspecified: Secondary | ICD-10-CM

## 2014-08-30 DIAGNOSIS — E785 Hyperlipidemia, unspecified: Secondary | ICD-10-CM | POA: Diagnosis not present

## 2014-08-30 DIAGNOSIS — Z7951 Long term (current) use of inhaled steroids: Secondary | ICD-10-CM | POA: Diagnosis not present

## 2014-08-30 LAB — I-STAT TROPONIN, ED
Troponin i, poc: 0 ng/mL (ref 0.00–0.08)
Troponin i, poc: 0.01 ng/mL (ref 0.00–0.08)

## 2014-08-30 LAB — BASIC METABOLIC PANEL
ANION GAP: 9 (ref 5–15)
BUN: 20 mg/dL (ref 6–20)
CALCIUM: 9.1 mg/dL (ref 8.9–10.3)
CO2: 24 mmol/L (ref 22–32)
Chloride: 106 mmol/L (ref 101–111)
Creatinine, Ser: 0.97 mg/dL (ref 0.44–1.00)
GFR calc Af Amer: >60 mL/min (ref >60)
GFR, EST NON AFRICAN AMERICAN: 53 mL/min — AB (ref >60)
GLUCOSE: 112 mg/dL — AB (ref 65–99)
Potassium: 4.2 mmol/L (ref 3.5–5.1)
SODIUM: 139 mmol/L (ref 135–145)

## 2014-08-30 LAB — CBC
HCT: 37 % (ref 36.0–46.0)
HEMOGLOBIN: 11.8 g/dL — AB (ref 12.0–15.0)
MCH: 29.7 pg (ref 26.0–34.0)
MCHC: 31.9 g/dL (ref 30.0–36.0)
MCV: 93.2 fL (ref 78.0–100.0)
PLATELETS: 301 10*3/uL (ref 150–400)
RBC: 3.97 MIL/uL (ref 3.87–5.11)
RDW: 13.6 % (ref 11.5–15.5)
WBC: 7.3 10*3/uL (ref 4.0–10.5)

## 2014-08-30 LAB — BRAIN NATRIURETIC PEPTIDE: B Natriuretic Peptide: 124.8 pg/mL — ABNORMAL HIGH (ref 0.0–100.0)

## 2014-08-30 MED ORDER — NITROGLYCERIN 0.4 MG SL SUBL
0.4000 mg | SUBLINGUAL_TABLET | Freq: Once | SUBLINGUAL | Status: AC
  Administered 2014-08-30: 0.4 mg via SUBLINGUAL
  Filled 2014-08-30: qty 1

## 2014-08-30 MED ORDER — ALBUTEROL SULFATE (2.5 MG/3ML) 0.083% IN NEBU
5.0000 mg | INHALATION_SOLUTION | Freq: Once | RESPIRATORY_TRACT | Status: AC
  Administered 2014-08-30: 5 mg via RESPIRATORY_TRACT
  Filled 2014-08-30: qty 6

## 2014-08-30 MED ORDER — PREDNISONE 20 MG PO TABS: 40.0000 mg | ORAL_TABLET | Freq: Every day | ORAL | Status: DC

## 2014-08-30 NOTE — Discharge Instructions (Signed)
Chest Pain (Nonspecific) °It is often hard to give a specific diagnosis for the cause of chest pain. There is always a chance that your pain could be related to something serious, such as a heart attack or a blood clot in the lungs. You need to follow up with your health care provider for further evaluation. °CAUSES  °· Heartburn. °· Pneumonia or bronchitis. °· Anxiety or stress. °· Inflammation around your heart (pericarditis) or lung (pleuritis or pleurisy). °· A blood clot in the lung. °· A collapsed lung (pneumothorax). It can develop suddenly on its own (spontaneous pneumothorax) or from trauma to the chest. °· Shingles infection (herpes zoster virus). °The chest wall is composed of bones, muscles, and cartilage. Any of these can be the source of the pain. °· The bones can be bruised by injury. °· The muscles or cartilage can be strained by coughing or overwork. °· The cartilage can be affected by inflammation and become sore (costochondritis). °DIAGNOSIS  °Lab tests or other studies may be needed to find the cause of your pain. Your health care provider may have you take a test called an ambulatory electrocardiogram (ECG). An ECG records your heartbeat patterns over a 24-hour period. You may also have other tests, such as: °· Transthoracic echocardiogram (TTE). During echocardiography, sound waves are used to evaluate how blood flows through your heart. °· Transesophageal echocardiogram (TEE). °· Cardiac monitoring. This allows your health care provider to monitor your heart rate and rhythm in real time. °· Holter monitor. This is a portable device that records your heartbeat and can help diagnose heart arrhythmias. It allows your health care provider to track your heart activity for several days, if needed. °· Stress tests by exercise or by giving medicine that makes the heart beat faster. °TREATMENT  °· Treatment depends on what may be causing your chest pain. Treatment may include: °· Acid blockers for  heartburn. °· Anti-inflammatory medicine. °· Pain medicine for inflammatory conditions. °· Antibiotics if an infection is present. °· You may be advised to change lifestyle habits. This includes stopping smoking and avoiding alcohol, caffeine, and chocolate. °· You may be advised to keep your head raised (elevated) when sleeping. This reduces the chance of acid going backward from your stomach into your esophagus. °Most of the time, nonspecific chest pain will improve within 2-3 days with rest and mild pain medicine.  °HOME CARE INSTRUCTIONS  °· If antibiotics were prescribed, take them as directed. Finish them even if you start to feel better. °· For the next few days, avoid physical activities that bring on chest pain. Continue physical activities as directed. °· Do not use any tobacco products, including cigarettes, chewing tobacco, or electronic cigarettes. °· Avoid drinking alcohol. °· Only take medicine as directed by your health care provider. °· Follow your health care provider's suggestions for further testing if your chest pain does not go away. °· Keep any follow-up appointments you made. If you do not go to an appointment, you could develop lasting (chronic) problems with pain. If there is any problem keeping an appointment, call to reschedule. °SEEK MEDICAL CARE IF:  °· Your chest pain does not go away, even after treatment. °· You have a rash with blisters on your chest. °· You have a fever. °SEEK IMMEDIATE MEDICAL CARE IF:  °· You have increased chest pain or pain that spreads to your arm, neck, jaw, back, or abdomen. °· You have shortness of breath. °· You have an increasing cough, or you cough   up blood.  You have severe back or abdominal pain.  You feel nauseous or vomit.  You have severe weakness.  You faint.  You have chills. This is an emergency. Do not wait to see if the pain will go away. Get medical help at once. Call your local emergency services (911 in [place] Do not drive  yourself to the hospital. MAKE SURE YOU:   Understand these instructions.  Will watch your condition.  Will get help right away if you are not doing well or get worse. Document Released: 12/27/2004 Document Revised: 03/24/2013 Document Reviewed: 10/23/2007 [facility] Patient Information 2015 [place], [place]. This information is not intended to replace advice given to you by your health care provider. Make sure you discuss any questions you have with your health care provider. Asthma Asthma is a recurring condition in which the airways tighten and narrow. Asthma can make it difficult to breathe. It can cause coughing, wheezing, and shortness of breath. Asthma episodes, also called asthma attacks, range from minor to life-threatening. Asthma cannot be cured, but medicines and lifestyle changes can help control it. CAUSES Asthma is believed to be caused by inherited (genetic) and environmental factors, but its exact cause is unknown. Asthma may be triggered by allergens, lung infections, or irritants in the air. Asthma triggers are different for each person. Common triggers include:   Animal dander.  Dust mites.  Cockroaches.  Pollen from trees or grass.  Mold.  Smoke.  Air pollutants such as dust, household cleaners, hair sprays, aerosol sprays, paint fumes, strong chemicals, or strong odors.  Cold air, weather changes, and winds (which increase molds and pollens in the air).  Strong emotional expressions such as crying or laughing hard.  Stress.  Certain medicines (such as aspirin) or types of drugs (such as beta-blockers).  Sulfites in foods and drinks. Foods and drinks that may contain sulfites include dried fruit, potato chips, and sparkling grape juice.  Infections or inflammatory conditions such as the flu, a cold, or an inflammation of the nasal membranes (rhinitis).  Gastroesophageal reflux disease (GERD).  Exercise or strenuous activity. SYMPTOMS Symptoms may occur  immediately after asthma is triggered or many hours later. Symptoms include:  Wheezing.  Excessive nighttime or early morning coughing.  Frequent or severe coughing with a common cold.  Chest tightness.  Shortness of breath. DIAGNOSIS  The diagnosis of asthma is made by a review of your medical history and a physical exam. Tests may also be performed. These may include:  Lung function studies. These tests show how much air you breathe in and out.  Allergy tests.  Imaging tests such as X-rays. TREATMENT  Asthma cannot be cured, but it can usually be controlled. Treatment involves identifying and avoiding your asthma triggers. It also involves medicines. There are 2 classes of medicine used for asthma treatment:   Controller medicines. These prevent asthma symptoms from occurring. They are usually taken every day.  Reliever or rescue medicines. These quickly relieve asthma symptoms. They are used as needed and provide short-term relief. Your health care provider will help you create an asthma action plan. An asthma action plan is a written plan for managing and treating your asthma attacks. It includes a list of your asthma triggers and how they may be avoided. It also includes information on when medicines should be taken and when their dosage should be changed. An action plan may also involve the use of a device called a peak flow meter. A peak flow meter measures  how well the lungs are working. It helps you monitor your condition. HOME CARE INSTRUCTIONS   Take medicines only as directed by your health care provider. Speak with your health care provider if you have questions about how or when to take the medicines.  Use a peak flow meter as directed by your health care provider. Record and keep track of readings.  Understand and use the action plan to help minimize or stop an asthma attack without needing to seek medical care.  Control your home environment in the following ways to  help prevent asthma attacks:  Do not smoke. Avoid being exposed to secondhand smoke.  Change your heating and air conditioning filter regularly.  Limit your use of fireplaces and wood stoves.  Get rid of pests (such as roaches and mice) and their droppings.  Throw away plants if you see mold on them.  Clean your floors and dust regularly. Use unscented cleaning products.  Try to have someone else vacuum for you regularly. Stay out of rooms while they are being vacuumed and for a short while afterward. If you vacuum, use a dust mask from a hardware store, a double-layered or microfilter vacuum cleaner bag, or a vacuum cleaner with a HEPA filter.  Replace carpet with wood, tile, or vinyl flooring. Carpet can trap dander and dust.  Use allergy-proof pillows, mattress covers, and box spring covers.  Wash bed sheets and blankets every week in hot water and dry them in a dryer.  Use blankets that are made of polyester or cotton.  Clean bathrooms and kitchens with bleach. If possible, have someone repaint the walls in these rooms with mold-resistant paint. Keep out of the rooms that are being cleaned and painted.  Wash hands frequently. SEEK MEDICAL CARE IF:   You have wheezing, shortness of breath, or a cough even if taking medicine to prevent attacks.  The colored mucus you cough up (sputum) is thicker than usual.  Your sputum changes from clear or white to yellow, green, gray, or bloody.  You have any problems that may be related to the medicines you are taking (such as a rash, itching, swelling, or trouble breathing).  You are using a reliever medicine more than 2-3 times per week.  Your peak flow is still at 50-79% of your personal best after following your action plan for 1 hour.  You have a fever. SEEK IMMEDIATE MEDICAL CARE IF:   You seem to be getting worse and are unresponsive to treatment during an asthma attack.  You are short of breath even at rest.  You get  short of breath when doing very little physical activity.  You have difficulty eating, drinking, or talking due to asthma symptoms.  You develop chest pain.  You develop a fast heartbeat.  You have a bluish color to your lips or fingernails.  You are light-headed, dizzy, or faint.  Your peak flow is less than 50% of your personal best. MAKE SURE YOU:   Understand these instructions.  Will watch your condition.  Will get help right away if you are not doing well or get worse. Document Released: 03/19/2005 Document Revised: 08/03/2013 Document Reviewed: 10/16/2012 [facility] Patient Information 2015 [place], [place]. This information is not intended to replace advice given to you by your health care provider. Make sure you discuss any questions you have with your health care provider.

## 2014-08-30 NOTE — ED Notes (Signed)
MD at bedside. 

## 2014-08-30 NOTE — ED Notes (Signed)
Pt enroute to [facility]

## 2014-08-30 NOTE — ED Notes (Signed)
Patient was seen at Urgent Care Saturday for similar symptoms and was told to come back in for a repeat EKG if symptoms started again. Has not come back to be evaluated until today. Reports non-radiating mid chest pain. Endorses SOB but denies dizziness/lightheadedness/N/V/D. Hx asthma. Was given Albuterol inhaler on Saturday. Used inhaler this morning around 1000. Took baby aspirin today. RR even/unlabored. In NAD.

## 2014-08-30 NOTE — ED Provider Notes (Signed)
CSN: [id number]     Arrival date & time 08/30/14  1346 History   First MD Initiated Contact with Patient 08/30/14 1546     Chief Complaint  Patient presents with  . Chest Pain  . Shortness of Breath     (Consider location/radiation/quality/duration/timing/severity/associated sxs/prior Treatment) Patient is a 81 y.o. female presenting with chest pain and shortness of breath. The history is provided by the patient.  Chest Pain Associated symptoms: shortness of breath   Associated symptoms: no abdominal pain, no back pain, no headache, no nausea, no numbness, not vomiting and no weakness   Shortness of Breath Associated symptoms: chest pain   Associated symptoms: no abdominal pain, no headaches, no rash and no vomiting    patient presents with some tightness in her chest. Began on Thursday and then recurred again today. She was seen in urgent care and given breathing treatment and sterilized. States she was feeling better up until today. She states it occurred again. His tightness. States she has had some more shortness of breath with exertion does have some tightness. No cough. No fevers. States it may feel like her previous asthma. No diaphoresis. No nausea. No sick contacts.  Past Medical History  Diagnosis Date  . Hematuria   . Asthma   . Hyperlipidemia   . Allergic rhinitis   . Atypical chest pain   . Collapse of right lung   . Lung nodules   . Arthritis   . Glaucoma   . Gastroesophageal reflux disease   . Dyslipidemia    Past Surgical History  Procedure Laterality Date  . Nasal sinus surgery    . Total abdominal hysterectomy w/ bilateral salpingoophorectomy    . Mediastinoscopy  4/11  . Mass excision  04/11/2012    Procedure: EXCISION MASS;  Surgeon: Wynonia Sours, MD;  Location: [facility];  Service: Orthopedics;  Laterality: Right;  Excision of mass right upper arm  . Cataract extraction Bilateral   . Anal fissure repair     Family History  Problem  Relation Age of Onset  . Cancer Mother   . Heart disease Father    History  Substance Use Topics  . Smoking status: Former Research scientist (life sciences)  . Smokeless tobacco: Never Used  . Alcohol Use: No   OB History    No data available     Review of Systems  Constitutional: Negative for activity change and appetite change.  Eyes: Negative for pain.  Respiratory: Positive for shortness of breath. Negative for chest tightness.   Cardiovascular: Positive for chest pain. Negative for leg swelling.  Gastrointestinal: Negative for nausea, vomiting, abdominal pain and diarrhea.  Genitourinary: Negative for flank pain.  Musculoskeletal: Negative for back pain and neck stiffness.  Skin: Negative for rash.  Neurological: Negative for weakness, numbness and headaches.  Psychiatric/Behavioral: Negative for behavioral problems.      Allergies  Shellfish allergy and Iodine  Home Medications   Prior to Admission medications   Medication Sig Start Date End Date Taking? Authorizing Provider  albuterol (PROVENTIL HFA;VENTOLIN HFA) 108 (90 BASE) MCG/ACT inhaler Inhale 2 puffs into the lungs every 6 (six) hours as needed for wheezing.   Yes Historical Provider, MD  calcium carbonate 200 MG capsule Take 200 mg by mouth at bedtime.    Yes Historical Provider, MD  cetirizine (ZYRTEC) 10 MG tablet Take 10 mg by mouth every morning.   Yes Historical Provider, MD  ferrous fumarate (HEMOCYTE - 106 MG FE) 325 (106 FE) MG  TABS Take 1 tablet by mouth every morning.    Yes Historical Provider, MD  Fluticasone-Salmeterol (ADVAIR) 100-50 MCG/DOSE AEPB Inhale 1 puff into the lungs every 12 (twelve) hours.   Yes Historical Provider, MD  latanoprost (XALATAN) 0.005 % ophthalmic solution Place 1 drop into both eyes at bedtime.   Yes Historical Provider, MD  Multiple Vitamin (MULTIVITAMIN) tablet Take 1 tablet by mouth daily.   Yes Historical Provider, MD  omeprazole (PRILOSEC) 20 MG capsule Take 20 mg by mouth daily as needed  (acid reflux).  06/09/11  Yes Historical Provider, MD  pravastatin (PRAVACHOL) 20 MG tablet Take 20 mg by mouth daily. 08/27/14  Yes Historical Provider, MD  meloxicam (MOBIC) 15 MG tablet Take 1 tablet (15 mg total) by mouth daily. Patient not taking: Reported on 08/30/2014 08/18/12   Kathrynn Ducking, MD  predniSONE (DELTASONE) 20 MG tablet Take 2 tablets (40 mg total) by mouth daily. 08/30/14   Davonna Belling, MD   BP 129/54 mmHg  Pulse 73  Temp(Src) 98.3 F (36.8 C) (Oral)  Resp 15  SpO2 100% Physical Exam  Constitutional: She is oriented to person, place, and time. She appears well-developed and well-nourished.  HENT:  Head: Normocephalic and atraumatic.  Eyes: EOM are normal. Pupils are equal, round, and reactive to light.  Neck: Normal range of motion. Neck supple. No JVD present.  Cardiovascular: Normal rate, regular rhythm and normal heart sounds.   No murmur heard. Pulmonary/Chest: Effort normal and breath sounds normal. No respiratory distress. She has no wheezes. She has no rales.  Abdominal: Soft. Bowel sounds are normal. She exhibits no distension. There is no tenderness. There is no rebound and no guarding.  Musculoskeletal: Normal range of motion.  Neurological: She is alert and oriented to person, place, and time. No cranial nerve deficit.  Skin: Skin is warm and dry.  Psychiatric: She has a normal mood and affect. Her speech is normal.  Nursing note and vitals reviewed.   ED Course  Procedures (including critical care time) Labs Review Labs Reviewed  CBC - Abnormal; Notable for the following:    Hemoglobin 11.8 (*)    All other components within normal limits  BASIC METABOLIC PANEL - Abnormal; Notable for the following:    Glucose, Bld 112 (*)    GFR calc non Af Amer 53 (*)    All other components within normal limits  BRAIN NATRIURETIC PEPTIDE - Abnormal; Notable for the following:    B Natriuretic Peptide 124.8 (*)    All other components within normal limits   I-STAT TROPOININ, ED  I-STAT TROPOININ, ED    Imaging Review Dg Chest 2 View  08/30/2014   CLINICAL DATA:  Sternal chest pain  EXAM: CHEST  2 VIEW  COMPARISON:  05/28/2012  FINDINGS: The heart size and mediastinal contours are within normal limits. Both lungs are clear. The visualized skeletal structures are unremarkable.  IMPRESSION: No active cardiopulmonary disease.   Electronically Signed   By: Kerby Moors M.D.   On: 08/30/2014 14:30     EKG Interpretation   Date/Time:  Monday Aug 30 2014 13:51:27 EDT Ventricular Rate:  76 PR Interval:  181 QRS Duration: 71 QT Interval:  347 QTC Calculation: 390 R Axis:   61 Text Interpretation:  Sinus rhythm Baseline wander in lead(s) I aVR V3  Confirmed by DELO  MD, DOUGLAS ([id number]) on 08/30/2014 2:44:13 PM      MDM   Final diagnoses:  Dyspnea  Chest pain, unspecified chest  pain type    Patient was some chest pain and dyspnea. May be related to her asthma. Does not have frank wheezing but feels better after breathing treatment. Does not appear to be CHF does not appear to be cardiac cause. I think this is not an anginal equivalent and she has 2 negative troponins. Will follow-up with cardiology.    Davonna Belling, MD 08/31/14 [phone]

## 2014-09-05 ENCOUNTER — Encounter (HOSPITAL_COMMUNITY): Payer: Self-pay | Admitting: *Deleted

## 2014-09-05 ENCOUNTER — Emergency Department (HOSPITAL_COMMUNITY): Payer: Medicare Other

## 2014-09-05 ENCOUNTER — Emergency Department (HOSPITAL_COMMUNITY)
Admission: EM | Admit: 2014-09-05 | Discharge: 2014-09-05 | Disposition: A | Discharge status: Home / Self Care | Payer: Medicare Other | Attending: Emergency Medicine | Admitting: Emergency Medicine

## 2014-09-05 DIAGNOSIS — M1990 Unspecified osteoarthritis, unspecified site: Secondary | ICD-10-CM | POA: Insufficient documentation

## 2014-09-05 DIAGNOSIS — R0789 Other chest pain: Secondary | ICD-10-CM | POA: Insufficient documentation

## 2014-09-05 DIAGNOSIS — Z791 Long term (current) use of non-steroidal anti-inflammatories (NSAID): Secondary | ICD-10-CM | POA: Diagnosis not present

## 2014-09-05 DIAGNOSIS — K219 Gastro-esophageal reflux disease without esophagitis: Secondary | ICD-10-CM | POA: Insufficient documentation

## 2014-09-05 DIAGNOSIS — Z7951 Long term (current) use of inhaled steroids: Secondary | ICD-10-CM | POA: Insufficient documentation

## 2014-09-05 DIAGNOSIS — Z79899 Other long term (current) drug therapy: Secondary | ICD-10-CM | POA: Insufficient documentation

## 2014-09-05 DIAGNOSIS — E785 Hyperlipidemia, unspecified: Secondary | ICD-10-CM | POA: Insufficient documentation

## 2014-09-05 DIAGNOSIS — H409 Unspecified glaucoma: Secondary | ICD-10-CM | POA: Diagnosis not present

## 2014-09-05 DIAGNOSIS — J45901 Unspecified asthma with (acute) exacerbation: Secondary | ICD-10-CM | POA: Insufficient documentation

## 2014-09-05 DIAGNOSIS — R0602 Shortness of breath: Secondary | ICD-10-CM | POA: Diagnosis present

## 2014-09-05 DIAGNOSIS — Z91040 Latex allergy status: Secondary | ICD-10-CM | POA: Diagnosis not present

## 2014-09-05 DIAGNOSIS — Z87891 Personal history of nicotine dependence: Secondary | ICD-10-CM | POA: Diagnosis not present

## 2014-09-05 LAB — CBC WITH DIFFERENTIAL/PLATELET
Basophils Absolute: 0 10*3/uL (ref 0.0–0.1)
Basophils Relative: 0 % (ref 0–1)
Eosinophils Absolute: 0.3 10*3/uL (ref 0.0–0.7)
Eosinophils Relative: 4 % (ref 0–5)
HCT: 33.6 % — ABNORMAL LOW (ref 36.0–46.0)
Hemoglobin: 11 g/dL — ABNORMAL LOW (ref 12.0–15.0)
Lymphocytes Relative: 44 % (ref 12–46)
Lymphs Abs: 2.9 10*3/uL (ref 0.7–4.0)
MCH: 29.5 pg (ref 26.0–34.0)
MCHC: 32.7 g/dL (ref 30.0–36.0)
MCV: 90.1 fL (ref 78.0–100.0)
Monocytes Absolute: 0.6 10*3/uL (ref 0.1–1.0)
Monocytes Relative: 10 % (ref 3–12)
Neutro Abs: 2.8 10*3/uL (ref 1.7–7.7)
Neutrophils Relative %: 42 % — ABNORMAL LOW (ref 43–77)
Platelets: 263 10*3/uL (ref 150–400)
RBC: 3.73 MIL/uL — ABNORMAL LOW (ref 3.87–5.11)
RDW: 13.5 % (ref 11.5–15.5)
WBC: 6.6 10*3/uL (ref 4.0–10.5)

## 2014-09-05 LAB — BASIC METABOLIC PANEL
Anion gap: 9 (ref 5–15)
BUN: 18 mg/dL (ref 6–20)
CO2: 24 mmol/L (ref 22–32)
Calcium: 8.7 mg/dL — ABNORMAL LOW (ref 8.9–10.3)
Chloride: 106 mmol/L (ref 101–111)
Creatinine, Ser: 0.97 mg/dL (ref 0.44–1.00)
GFR calc Af Amer: >60 mL/min (ref >60)
GFR calc non Af Amer: 53 mL/min — ABNORMAL LOW (ref >60)
Glucose, Bld: 82 mg/dL (ref 65–99)
Potassium: 3.9 mmol/L (ref 3.5–5.1)
Sodium: 139 mmol/L (ref 135–145)

## 2014-09-05 LAB — I-STAT TROPONIN, ED: Troponin i, poc: 0 ng/mL (ref 0.00–0.08)

## 2014-09-05 LAB — BRAIN NATRIURETIC PEPTIDE: B Natriuretic Peptide: 106.1 pg/mL — ABNORMAL HIGH (ref 0.0–100.0)

## 2014-09-05 LAB — D-DIMER, QUANTITATIVE (NOT AT ARMC): D-Dimer, Quant: <0.27 ug/mL-FEU (ref 0.00–0.48)

## 2014-09-05 MED ORDER — IPRATROPIUM-ALBUTEROL 0.5-2.5 (3) MG/3ML IN SOLN
3.0000 mL | Freq: Once | RESPIRATORY_TRACT | Status: AC
  Administered 2014-09-05: 3 mL via RESPIRATORY_TRACT
  Filled 2014-09-05: qty 3

## 2014-09-05 MED ORDER — PREDNISONE 20 MG PO TABS: ORAL_TABLET | ORAL | Status: DC

## 2014-09-05 NOTE — ED Notes (Signed)
Patient transported to X-ray 

## 2014-09-05 NOTE — ED Notes (Signed)
Pt reports central chest pain, sts it's been going on for oer a week was seen here on Monday 5/30 for same. Pt reports shortness of breath denies n/v, radiation of pain.

## 2014-09-05 NOTE — ED Provider Notes (Signed)
CSN: [id number]     Arrival date & time 09/05/14  5465 History   First MD Initiated Contact with Patient 09/05/14 [phone]     Chief Complaint  Patient presents with  . Shortness of Breath     (Consider location/radiation/quality/duration/timing/severity/associated sxs/prior Treatment) HPI  Pt is an 81yo female wit hhx of asthma, atypical chest pain, collapse of Right lung, and GERD, presenting to ED with c/o SOB and centralized chest tightness that has been intermittent for 1 week. Pt states she was seen on Monday, 5/30, but discharged home after unremarkable cardiopulmonary disease. Pt to f/u with her allergy specialist in 2 days for her reported SOB, however, pt came to ED today due to SOB worsening last night w/o relief of albuterol inhaler. Pt states her albuterol seemed to make symptoms worse.  Denies fever, chills, n/v/d. Pt denies CP at this time.  Denies home oxygen use.    Past Medical History  Diagnosis Date  . Hematuria   . Asthma   . Hyperlipidemia   . Allergic rhinitis   . Atypical chest pain   . Collapse of right lung   . Lung nodules   . Arthritis   . Glaucoma   . Gastroesophageal reflux disease   . Dyslipidemia    Past Surgical History  Procedure Laterality Date  . Nasal sinus surgery    . Total abdominal hysterectomy w/ bilateral salpingoophorectomy    . Mediastinoscopy  4/11  . Mass excision  04/11/2012    Procedure: EXCISION MASS;  Surgeon: Wynonia Sours, MD;  Location: [facility];  Service: Orthopedics;  Laterality: Right;  Excision of mass right upper arm  . Cataract extraction Bilateral   . Anal fissure repair     Family History  Problem Relation Age of Onset  . Cancer Mother   . Heart disease Father    History  Substance Use Topics  . Smoking status: Former Research scientist (life sciences)  . Smokeless tobacco: Never Used  . Alcohol Use: No   OB History    No data available     Review of Systems  Constitutional: Negative for fever, chills, diaphoresis,  appetite change and fatigue.  HENT: Negative for congestion.   Respiratory: Positive for chest tightness and shortness of breath. Negative for cough.   Cardiovascular: Negative for chest pain, palpitations and leg swelling.  Gastrointestinal: Negative for nausea, vomiting, abdominal pain, diarrhea and constipation.  All other systems reviewed and are negative.     Allergies  Shellfish allergy; Iodine; Latex; and Adhesive  Home Medications   Prior to Admission medications   Medication Sig Start Date End Date Taking? Authorizing Provider  albuterol (PROVENTIL HFA;VENTOLIN HFA) 108 (90 BASE) MCG/ACT inhaler Inhale 2 puffs into the lungs every 6 (six) hours as needed for wheezing.   Yes Historical Provider, MD  calcium carbonate 200 MG capsule Take 200 mg by mouth at bedtime.    Yes Historical Provider, MD  cetirizine (ZYRTEC) 10 MG tablet Take 10 mg by mouth every morning.   Yes Historical Provider, MD  ferrous fumarate (HEMOCYTE - 106 MG FE) 325 (106 FE) MG TABS Take 1 tablet by mouth every morning.    Yes Historical Provider, MD  Fluticasone-Salmeterol (ADVAIR) 100-50 MCG/DOSE AEPB Inhale 1 puff into the lungs every 12 (twelve) hours.   Yes Historical Provider, MD  latanoprost (XALATAN) 0.005 % ophthalmic solution Place 1 drop into both eyes at bedtime.   Yes Historical Provider, MD  Multiple Vitamin (MULTIVITAMIN) tablet Take 1 tablet  by mouth daily.   Yes Historical Provider, MD  omeprazole (PRILOSEC) 20 MG capsule Take 20 mg by mouth daily as needed (acid reflux).  06/09/11  Yes Historical Provider, MD  pravastatin (PRAVACHOL) 20 MG tablet Take 20 mg by mouth daily. 08/27/14  Yes Historical Provider, MD  meloxicam (MOBIC) 15 MG tablet Take 1 tablet (15 mg total) by mouth daily. Patient not taking: Reported on 08/30/2014 08/18/12   Kathrynn Ducking, MD  predniSONE (DELTASONE) 20 MG tablet Take 2 tablets (40 mg total) by mouth daily. Patient not taking: Reported on 09/05/2014 08/30/14   Davonna Belling, MD  predniSONE (DELTASONE) 20 MG tablet 3 tabs po day one, then 2 po daily x 4 days 09/05/14   Noland Fordyce, PA-C   BP 158/59 mmHg  Pulse 78  Temp(Src) 98.2 F (36.8 C) (Oral)  Resp 16  SpO2 100% Physical Exam  Constitutional: She appears well-developed and well-nourished. No distress.  HENT:  Head: Normocephalic and atraumatic.  Eyes: Conjunctivae are normal. No scleral icterus.  Neck: Normal range of motion. Neck supple.  Cardiovascular: Normal rate, regular rhythm and normal heart sounds.   Pulmonary/Chest: Effort normal and breath sounds normal. No respiratory distress. She has no wheezes. She has no rales. She exhibits no tenderness.  No respiratory distress, able to speak in full sentences w/o difficulty. Lungs: CTAB  Abdominal: Soft. Bowel sounds are normal. She exhibits no distension and no mass. There is no tenderness. There is no rebound and no guarding.  Musculoskeletal: Normal range of motion.  Neurological: She is alert.  Skin: Skin is warm and dry. She is not diaphoretic.  Nursing note and vitals reviewed.   ED Course  Procedures (including critical care time) Labs Review Labs Reviewed  BASIC METABOLIC PANEL - Abnormal; Notable for the following:    Calcium 8.7 (*)    GFR calc non Af Amer 53 (*)    All other components within normal limits  BRAIN NATRIURETIC PEPTIDE - Abnormal; Notable for the following:    B Natriuretic Peptide 106.1 (*)    All other components within normal limits  CBC WITH DIFFERENTIAL/PLATELET - Abnormal; Notable for the following:    RBC 3.73 (*)    Hemoglobin 11.0 (*)    HCT 33.6 (*)    Neutrophils Relative % 42 (*)    All other components within normal limits  D-DIMER, QUANTITATIVE (NOT AT [facility])  Randolm Idol, ED    Imaging Review Dg Chest 2 View  09/05/2014   CLINICAL DATA:  Mid chest pain for several weeks.  Short of breath.  EXAM: CHEST  2 VIEW  COMPARISON:  08/30/2014  FINDINGS: Normal mediastinum and cardiac  silhouette. Normal pulmonary vasculature. No evidence of effusion, infiltrate, or pneumothorax. No acute bony abnormality. Mild hyperinflation of lungs.  IMPRESSION: Mild hyperinflation of lungs.  No acute findings.   Electronically Signed   By: Suzy Bouchard M.D.   On: 09/05/2014 10:53     EKG Interpretation None      MDM   Final diagnoses:  Shortness of breath  Chest tightness    Pt is an 81yo female with hx of asthma, presenting to ED with SOB and chest tightness, intermittent since d/c from ED on 08/30/14 when she was evaluated for same. Previous medical records reviewed. Cardiopulmonary workup repeated today.  Lungs: CTAB, O2 Sat 100% on RA.  Pt given Duoneb as neb tx helped pt last visit despite no wheeze on exam. D-dimer added to today's labs, however,  pt low risk for PE. Labs: unremarkable. Negative d-dimer. Labs c/w previous with BNP improved from 1 week ago Discussed pt with Dr. Tomi Bamberger who also examined pt.  Agrees pt is safe for discharge home. Encouraged to f/u with her allergist in 2 days as previously scheduled.  Return precautions provided. Pt verbalized understanding and agreement with tx plan.     Noland Fordyce, PA-C 09/05/14 [place], MD 09/05/14 [phone]

## 2014-09-07 ENCOUNTER — Encounter: Payer: Self-pay | Admitting: *Deleted

## 2014-09-08 ENCOUNTER — Ambulatory Visit: Payer: Medicare Other | Admitting: Cardiovascular Disease

## 2014-10-05 ENCOUNTER — Other Ambulatory Visit: Payer: Self-pay | Admitting: Gastroenterology

## 2014-10-05 DIAGNOSIS — R1310 Dysphagia, unspecified: Secondary | ICD-10-CM

## 2014-10-06 ENCOUNTER — Ambulatory Visit
Admission: RE | Admit: 2014-10-06 | Discharge: 2014-10-06 | Disposition: A | Discharge status: Home / Self Care | Payer: Medicare Other | Source: Ambulatory Visit | Attending: Gastroenterology | Admitting: Gastroenterology

## 2014-10-06 DIAGNOSIS — R1310 Dysphagia, unspecified: Secondary | ICD-10-CM

## 2014-10-09 ENCOUNTER — Emergency Department (HOSPITAL_COMMUNITY): Payer: Medicare Other

## 2014-10-09 ENCOUNTER — Emergency Department (HOSPITAL_COMMUNITY)
Admission: EM | Admit: 2014-10-09 | Discharge: 2014-10-09 | Disposition: A | Discharge status: Home / Self Care | Payer: Medicare Other | Attending: Emergency Medicine | Admitting: Emergency Medicine

## 2014-10-09 ENCOUNTER — Encounter (HOSPITAL_COMMUNITY): Payer: Self-pay | Admitting: *Deleted

## 2014-10-09 DIAGNOSIS — Z79899 Other long term (current) drug therapy: Secondary | ICD-10-CM | POA: Insufficient documentation

## 2014-10-09 DIAGNOSIS — R079 Chest pain, unspecified: Secondary | ICD-10-CM | POA: Insufficient documentation

## 2014-10-09 DIAGNOSIS — K219 Gastro-esophageal reflux disease without esophagitis: Secondary | ICD-10-CM | POA: Diagnosis not present

## 2014-10-09 DIAGNOSIS — Z91040 Latex allergy status: Secondary | ICD-10-CM | POA: Diagnosis not present

## 2014-10-09 DIAGNOSIS — J45901 Unspecified asthma with (acute) exacerbation: Secondary | ICD-10-CM | POA: Insufficient documentation

## 2014-10-09 DIAGNOSIS — R0602 Shortness of breath: Secondary | ICD-10-CM

## 2014-10-09 DIAGNOSIS — M1990 Unspecified osteoarthritis, unspecified site: Secondary | ICD-10-CM | POA: Insufficient documentation

## 2014-10-09 DIAGNOSIS — H409 Unspecified glaucoma: Secondary | ICD-10-CM | POA: Insufficient documentation

## 2014-10-09 DIAGNOSIS — Z87891 Personal history of nicotine dependence: Secondary | ICD-10-CM | POA: Insufficient documentation

## 2014-10-09 DIAGNOSIS — E785 Hyperlipidemia, unspecified: Secondary | ICD-10-CM | POA: Insufficient documentation

## 2014-10-09 LAB — I-STAT TROPONIN, ED: Troponin i, poc: 0 ng/mL (ref 0.00–0.08)

## 2014-10-09 LAB — BASIC METABOLIC PANEL
Anion gap: 6 (ref 5–15)
BUN: 13 mg/dL (ref 6–20)
CALCIUM: 9 mg/dL (ref 8.9–10.3)
CO2: 25 mmol/L (ref 22–32)
CREATININE: 0.87 mg/dL (ref 0.44–1.00)
Chloride: 107 mmol/L (ref 101–111)
GFR calc Af Amer: >60 mL/min (ref >60)
GLUCOSE: 81 mg/dL (ref 65–99)
Potassium: 3.9 mmol/L (ref 3.5–5.1)
SODIUM: 138 mmol/L (ref 135–145)

## 2014-10-09 LAB — CBC
HCT: 35.5 % — ABNORMAL LOW (ref 36.0–46.0)
HEMOGLOBIN: 11.5 g/dL — AB (ref 12.0–15.0)
MCH: 29.8 pg (ref 26.0–34.0)
MCHC: 32.4 g/dL (ref 30.0–36.0)
MCV: 92 fL (ref 78.0–100.0)
Platelets: 307 10*3/uL (ref 150–400)
RBC: 3.86 MIL/uL — AB (ref 3.87–5.11)
RDW: 13.3 % (ref 11.5–15.5)
WBC: 5.7 10*3/uL (ref 4.0–10.5)

## 2014-10-09 LAB — BRAIN NATRIURETIC PEPTIDE: B NATRIURETIC PEPTIDE 5: 69.6 pg/mL (ref 0.0–100.0)

## 2014-10-09 MED ORDER — ALBUTEROL SULFATE HFA 108 (90 BASE) MCG/ACT IN AERS
2.0000 | INHALATION_SPRAY | Freq: Once | RESPIRATORY_TRACT | Status: AC
  Administered 2014-10-09: 2 via RESPIRATORY_TRACT
  Filled 2014-10-09: qty 6.7

## 2014-10-09 MED ORDER — AEROCHAMBER Z-STAT PLUS/MEDIUM MISC
1.0000 | Freq: Once | Status: AC
  Administered 2014-10-09: 1
  Filled 2014-10-09: qty 1

## 2014-10-09 MED ORDER — IPRATROPIUM-ALBUTEROL 0.5-2.5 (3) MG/3ML IN SOLN
3.0000 mL | RESPIRATORY_TRACT | Status: DC
  Administered 2014-10-09: 3 mL via RESPIRATORY_TRACT
  Filled 2014-10-09: qty 3

## 2014-10-09 NOTE — ED Provider Notes (Signed)
CSN: [id number]     Arrival date & time 10/09/14  1053 History   First MD Initiated Contact with Patient 10/09/14 1112     Chief Complaint  Patient presents with  . Chest Pain  . Shortness of Breath     (Consider location/radiation/quality/duration/timing/severity/associated sxs/prior Treatment) HPI Anne [name] is a 81 y.o. female history of asthma, atypical chest pain, right lung collapse, GERD comes in for evaluation of shortness of breath and central chest tightness. Patient states she has been seen in the ED x2, seen her PCP, her allergist, her gastroenterologist and has had a benign workup at each one. She does, however, report her primary care and wants to do pulmonary function tests for her. She presents today with concerns for intermittent shortness of breath, labored breathing and chest tightness. This is been ongoing for the past 2 months and is unchanged today. She denies any discomfort now in ED. She does report some relief with breathing treatments and albuterol at home. She denies fevers, chills, cough, leg swelling, increased dyspnea on exertion, hemoptysis, recent travel/surgeries/injury. No nausea, vomiting, diaphoresis, abdominal pain. Reports she has not smoked since college. She appears in no acute distress now, talking in full sentences with ease.  Past Medical History  Diagnosis Date  . Hematuria   . Asthma   . Hyperlipidemia   . Allergic rhinitis   . Atypical chest pain   . Collapse of right lung   . Lung nodules   . Arthritis   . Glaucoma   . Gastroesophageal reflux disease   . Dyslipidemia    Past Surgical History  Procedure Laterality Date  . Nasal sinus surgery    . Total abdominal hysterectomy w/ bilateral salpingoophorectomy    . Mediastinoscopy  4/11  . Mass excision  04/11/2012    Procedure: EXCISION MASS;  Surgeon: Wynonia Sours, MD;  Location: [facility];  Service: Orthopedics;  Laterality: Right;  Excision of mass right upper arm  .  Cataract extraction Bilateral   . Anal fissure repair     Family History  Problem Relation Age of Onset  . Cancer Mother   . Heart disease Father    History  Substance Use Topics  . Smoking status: Former Research scientist (life sciences)  . Smokeless tobacco: Never Used  . Alcohol Use: No   OB History    No data available     Review of Systems A 10 point review of systems was completed and was negative except for pertinent positives and negatives as mentioned in the history of present illness     Allergies  Shellfish allergy; Iodine; Latex; and Adhesive  Home Medications   Prior to Admission medications   Medication Sig Start Date End Date Taking? Authorizing Provider  albuterol (PROVENTIL HFA;VENTOLIN HFA) 108 (90 BASE) MCG/ACT inhaler Inhale 2 puffs into the lungs every 6 (six) hours as needed for wheezing.   Yes Historical Provider, MD  calcium carbonate 200 MG capsule Take 200 mg by mouth at bedtime.    Yes Historical Provider, MD  cetirizine (ZYRTEC) 10 MG tablet Take 10 mg by mouth every morning.   Yes Historical Provider, MD  dexlansoprazole (DEXILANT) 60 MG capsule Take 60 mg by mouth daily.   Yes Historical Provider, MD  ferrous fumarate (HEMOCYTE - 106 MG FE) 325 (106 FE) MG TABS Take 1 tablet by mouth every morning.    Yes Historical Provider, MD  latanoprost (XALATAN) 0.005 % ophthalmic solution Place 1 drop into both eyes at  bedtime.   Yes Historical Provider, MD  Multiple Vitamin (MULTIVITAMIN) tablet Take 1 tablet by mouth every morning.    Yes Historical Provider, MD  pravastatin (PRAVACHOL) 20 MG tablet Take 20 mg by mouth every evening.  08/27/14  Yes Historical Provider, MD  salmeterol (SEREVENT) 50 MCG/DOSE diskus inhaler Inhale 1 puff into the lungs every 6 (six) hours.   Yes Historical Provider, MD  meloxicam (MOBIC) 15 MG tablet Take 1 tablet (15 mg total) by mouth daily. Patient not taking: Reported on 08/30/2014 08/18/12   Kathrynn Ducking, MD  predniSONE (DELTASONE) 20 MG  tablet Take 2 tablets (40 mg total) by mouth daily. Patient not taking: Reported on 09/05/2014 08/30/14   Davonna Belling, MD  predniSONE (DELTASONE) 20 MG tablet 3 tabs po day one, then 2 po daily x 4 days Patient not taking: Reported on 10/09/2014 09/05/14   Noland Fordyce, PA-C   BP 150/61 mmHg  Pulse 75  Temp(Src) 98.1 F (36.7 C) (Oral)  Resp 16  SpO2 100% Physical Exam  Constitutional: She is oriented to person, place, and time. She appears well-developed and well-nourished. No distress.  Very well-appearing. Speaks in full sentences without any evidence of respiratory compromise.  HENT:  Head: Normocephalic and atraumatic.  Mouth/Throat: Oropharynx is clear and moist.  Eyes: Conjunctivae are normal. Pupils are equal, round, and reactive to light. Right eye exhibits no discharge. Left eye exhibits no discharge. No scleral icterus.  Neck: Normal range of motion. Neck supple.  Cardiovascular: Normal rate, regular rhythm and normal heart sounds.   Pulmonary/Chest: Effort normal and breath sounds normal. No respiratory distress. She has no wheezes. She has no rales.  Clear to auscultation bilaterally. 99% on room air  Abdominal: Soft. There is no tenderness.  Musculoskeletal: Normal range of motion. She exhibits no edema or tenderness.  Neurological: She is alert and oriented to person, place, and time.  Cranial Nerves II-XII grossly intact  Skin: Skin is warm and dry. No rash noted. She is not diaphoretic.  Psychiatric: She has a normal mood and affect.  Nursing note and vitals reviewed.   ED Course  Procedures (including critical care time) Labs Review Labs Reviewed  CBC - Abnormal; Notable for the following:    RBC 3.86 (*)    Hemoglobin 11.5 (*)    HCT 35.5 (*)    All other components within normal limits  BASIC METABOLIC PANEL  BRAIN NATRIURETIC PEPTIDE  I-STAT TROPOININ, ED    Imaging Review Dg Chest 2 View  10/09/2014   CLINICAL DATA:  Patient with chest pressure and  shortness of breath. History of asthma.  EXAM: CHEST  2 VIEW  COMPARISON:  Chest radiograph 11/05/2014  FINDINGS: Monitoring leads overlie the patient. Stable cardiac and mediastinal contours. No consolidative pulmonary opacities. No pleural effusion or pneumothorax. Mid thoracic spine degenerative changes.  IMPRESSION: No acute cardiopulmonary process.   Electronically Signed   By: Lovey Newcomer M.D.   On: 10/09/2014 12:21     EKG Interpretation   Date/Time:  Saturday October 09 2014 11:05:04 EDT Ventricular Rate:  94 PR Interval:  190 QRS Duration: 98 QT Interval:  379 QTC Calculation: 474 R Axis:   66 Text Interpretation:  Sinus rhythm Low voltage, precordial leads Probable  anteroseptal infarct, old Confirmed by PICKERING  MD, NATHAN [phone]) on  10/09/2014 11:11:54 AM     Meds given in ED:  Medications  aerochamber Z-Stat Plus/medium 1 each (1 each Other Given 10/09/14 1436)  albuterol (PROVENTIL  HFA;VENTOLIN HFA) 108 (90 BASE) MCG/ACT inhaler 2 puff (2 puffs Inhalation Given 10/09/14 1436)    Discharge Medication List as of 10/09/2014  2:19 PM     Filed Vitals:   10/09/14 1059 10/09/14 1256 10/09/14 1342  BP: 130/43  150/61  Pulse: 72  75  Temp: 98.1 F (36.7 C)    TempSrc: Oral    Resp: 16  16  SpO2: 99% 100% 100%    MDM  Vitals stable - WNL -afebrile Pt resting comfortably in ED. states her symptoms have resolved after administration of DuoNeb 1. PE--benign lung exam. No evidence of CHF. Physical exam is grossly benign. Labwork: D-dimer done on 6/5 negative, labs otherwise not concerning. BNP 69, Troponin negative, EKG is reassuring. Imaging: Chest x-ray shows no acute current pulmonary pathology. I personally reviewed the imaging and agree with the results as interpreted by the radiologist.  DDX: Patient presents with intermittent shortness of breath and subjectively her breathing improved with breathing treatments. No evidence of ACS. Low concern for PE, CHF, pneumonia. No  evidence of other acute or emergent pathology at this time. Discussed follow-up with her PCP for pulmonary function tests as previously recommended. DC with albuterol inhaler with associated AeroChamber. I discussed all relevant lab findings and imaging results with pt and they verbalized understanding. Discussed f/u with PCP within 48 hrs and return precautions, pt very amenable to plan. Prior to patient discharge, I discussed and reviewed this case my attending, Dr. Colin Rhein who also saw and evaluated the patient  Final diagnoses:  Shortness of breath        Comer Locket, PA-C 10/10/14 3736  Debby Freiberg, MD 10/11/14 0830

## 2014-10-09 NOTE — ED Notes (Signed)
Pt reports ongoing chest pressure, has been worked up for it by 3 different MDs. Today, woke up with central chest tightness and sob, worse pain than it has been in the last several days. Denies n/v, diaphoresis, lightheadedness/dizziness.

## 2014-10-09 NOTE — Discharge Instructions (Signed)
It is important for you to follow-up with your PCP/pulmonologist for further evaluation and management of your symptoms. Please use your albuterol inhaler and AeroChamber as needed for shortness of breath and chest tightness at home. Return to ED for worsening symptoms.  Shortness of Breath Shortness of breath means you have trouble breathing. It could also mean that you have a medical problem. You should get immediate medical care for shortness of breath. CAUSES   Not enough oxygen in the air such as with high altitudes or a smoke-filled room.  Certain lung diseases, infections, or problems.  Heart disease or conditions, such as angina or heart failure.  Low red blood cells (anemia).  Poor physical fitness, which can cause shortness of breath when you exercise.  Chest or back injuries or stiffness.  Being overweight.  Smoking.  Anxiety, which can make you feel like you are not getting enough air. DIAGNOSIS  Serious medical problems can often be found during your physical exam. Tests may also be done to determine why you are having shortness of breath. Tests may include:  Chest X-rays.  Lung function tests.  Blood tests.  An electrocardiogram (ECG).  An ambulatory electrocardiogram. An ambulatory ECG records your heartbeat patterns over a 24-hour period.  Exercise testing.  A transthoracic echocardiogram (TTE). During echocardiography, sound waves are used to evaluate how blood flows through your heart.  A transesophageal echocardiogram (TEE).  Imaging scans. Your health care provider may not be able to find a cause for your shortness of breath after your exam. In this case, it is important to have a follow-up exam with your health care provider as directed.  TREATMENT  Treatment for shortness of breath depends on the cause of your symptoms and can vary greatly. HOME CARE INSTRUCTIONS   Do not smoke. Smoking is a common cause of shortness of breath. If you smoke, ask  for help to quit.  Avoid being around chemicals or things that may bother your breathing, such as paint fumes and dust.  Rest as needed. Slowly resume your usual activities.  If medicines were prescribed, take them as directed for the full length of time directed. This includes oxygen and any inhaled medicines.  Keep all follow-up appointments as directed by your health care provider. SEEK MEDICAL CARE IF:   Your condition does not improve in the time expected.  You have a hard time doing your normal activities even with rest.  You have any new symptoms. SEEK IMMEDIATE MEDICAL CARE IF:   Your shortness of breath gets worse.  You feel light-headed, faint, or develop a cough not controlled with medicines.  You start coughing up blood.  You have pain with breathing.  You have chest pain or pain in your arms, shoulders, or abdomen.  You have a fever.  You are unable to walk up stairs or exercise the way you normally do. MAKE SURE YOU:  Understand these instructions.  Will watch your condition.  Will get help right away if you are not doing well or get worse. Document Released: 12/12/2000 Document Revised: 03/24/2013 Document Reviewed: 06/04/2011 [facility] Patient Information 2015 [place], [place]. This information is not intended to replace advice given to you by your health care provider. Make sure you discuss any questions you have with your health care provider.

## 2014-10-11 ENCOUNTER — Telehealth: Payer: Self-pay | Admitting: Internal Medicine

## 2014-10-11 NOTE — Telephone Encounter (Signed)
Rec'd from Rowena @ [facility] forward 10 pages to Dr.Wert

## 2014-10-14 ENCOUNTER — Encounter: Payer: Self-pay | Admitting: Internal Medicine

## 2014-10-14 ENCOUNTER — Ambulatory Visit (INDEPENDENT_AMBULATORY_CARE_PROVIDER_SITE_OTHER): Payer: Medicare Other | Admitting: Internal Medicine

## 2014-10-14 VITALS — BP 132/62 | HR 74 | Ht 67.0 in | Wt 162.6 lb

## 2014-10-14 DIAGNOSIS — J4531 Mild persistent asthma with (acute) exacerbation: Secondary | ICD-10-CM

## 2014-10-14 MED ORDER — PREDNISONE 10 MG PO TABS
ORAL_TABLET | ORAL | Status: DC
Start: 2014-10-14 — End: 2014-11-10

## 2014-10-14 MED ORDER — MOMETASONE FURO-FORMOTEROL FUM 100-5 MCG/ACT IN AERO: 2.0000 | INHALATION_SPRAY | Freq: Two times a day (BID) | RESPIRATORY_TRACT | Status: DC

## 2014-10-14 NOTE — Patient Instructions (Addendum)
Plan A = Automatic = dulera 100 Take 2 puffs first thing in am and then another 2 puffs about 12 hours later.   Plan B = Backup Only use your albuterol (proventil) as a rescue medication to be used if you can't catch your breath by resting or doing a relaxed purse lip breathing pattern.  - The less you use it, the better it will work when you need it. - Ok to use up to 2 puffs  every 4 hours if you must but call for immediate appointment if use goes up over your usual need - Don't leave home without it !!  (think of it like the spare tire for your car)   Work on inhaler technique:  relax and gently blow all the way out then take a nice smooth deep breath back in, triggering the inhaler at same time you start breathing in.  Hold for up to 5 seconds if you can.  Blow out thru nose.  Rinse and gargle with water when done  Dexilant   Take  30-60 min before first meal of the day and Zantac 150  one @  bedtime until return to office - this is the best way to tell whether stomach acid is contributing to your problem.    GERD (REFLUX)  is an extremely common cause of respiratory symptoms just like yours , many times with no obvious heartburn at all.    It can be treated with medication, but also with lifestyle changes including elevation of the head of your bed (ideally with 6 inch  bed blocks),  Smoking cessation, avoidance of late meals, excessive alcohol, and avoid fatty foods, chocolate, peppermint, colas, red wine, and acidic juices such as orange juice.  NO MINT OR MENTHOL PRODUCTS SO NO COUGH DROPS  USE SUGARLESS CANDY INSTEAD (Jolley ranchers or Stover's or Life Savers) or even ice chips will also do - the key is to swallow to prevent all throat clearing. NO OIL BASED VITAMINS - use powdered substitutes.  If not getting better  Prednisone 10 mg take  4 each am x 2 days,   2 each am x 2 days,  1 each am x 2 days and stop   For drainage take chlortrimeton (chlorpheniramine) 4 mg every 4 hours  available over the counter (may cause drowsiness - be sure to take 1 or 2 at bedtime)   Please schedule a follow up office visit in 2 weeks, sooner if needed  - bring all medications in 2 bags automatics vs as needed including over the counter meds

## 2014-10-14 NOTE — Progress Notes (Signed)
Subjective:    Patient ID: Anne [name], female    DOB: [birth year]    MRN: [id number]  HPI  76 yobf never smoker followed by Dr Jethro Bolus prev on allergy shots x 5 years completed while he was still with this group on [place] (so prior to late 1990's) and previously maintained on Advair / and prn albuterol  but much worse early June 2016  abruptly while on Advair so stopped advair and started just using albuterol > 3 ER trips and one [facility] ov and not better dx GERD > [facility] eval for gerd but not improving > rec pulmonary eval and seen in pulmonary clinic 10/14/2014     10/14/2014 1st [facility] office visit/ [facility]   Chief Complaint  Patient presents with  . Advice Only    referred by Dr. Laural Benes; Asthma flare up.  Started at the end of May.    was doing fine until 6 weeks prior to OV  With breathing bad esp @ hs assoc with sense of pnds /watery/ better with with prednisone /flonase in past Sob with chest tightness "like always" better with albterol to where not sob with adls, just exertion  No obvious day to day or daytime variability or assoc excess/ purulent sputum production  or cp or   subjective wheeze or overt hb symptoms. No unusual exp hx or h/o childhood pna/ asthma or knowledge of premature birth.  Sleeping ok without nocturnal  or early am exacerbation  of respiratory  c/o's or need for noct saba. Also denies any obvious fluctuation of symptoms with weather or environmental changes or other aggravating or alleviating factors except as outlined above   Current Medications, Allergies, Complete Past Medical History, Past Surgical History, Family History, and Social History were reviewed in [employer] record.  ROS  The following are not active complaints unless bolded sore throat, dysphagia, dental problems, itching, sneezing,  nasal congestion or excess/ purulent secretions, ear ache,   fever, chills, sweats, unintended wt loss, classically  pleuritic or exertional cp, hemoptysis,  orthopnea pnd or leg swelling, presyncope, palpitations, abdominal pain, anorexia, nausea, vomiting, diarrhea  or change in bowel or bladder habits, change in stools or urine, dysuria,hematuria,  rash, arthralgias, visual complaints, headache, numbness, weakness or ataxia or problems with walking or coordination,  change in mood/affect or memory.           Review of Systems  Constitutional: Negative for fever, chills and unexpected weight change.  HENT: Positive for congestion, postnasal drip and sinus pressure. Negative for dental problem, ear pain, nosebleeds, rhinorrhea, sneezing, sore throat, trouble swallowing and voice change.   Eyes: Negative for visual disturbance.  Respiratory: Positive for chest tightness and shortness of breath. Negative for cough and choking.   Cardiovascular: Negative for chest pain and leg swelling.  Gastrointestinal: Negative for vomiting, abdominal pain and diarrhea.  Genitourinary: Negative for difficulty urinating.  Musculoskeletal: Negative for arthralgias.  Skin: Negative for rash.  Neurological: Negative for tremors, syncope and headaches.  Hematological: Does not bruise/bleed easily.       Objective:   Physical Exam  amb bf very  easily confused with details of care  Wt Readings from Last 3 Encounters:  10/14/14 162 lb 9.6 oz (73.755 kg)  08/18/12 164 lb (74.39 kg)  05/31/12 155 lb (70.308 kg)    Vital signs reviewed   HEENT: nl dentition, turbinates, and orophanx. Nl external ear canals without cough reflex   NECK :  without  JVD/Nodes/TM/ nl carotid upstrokes bilaterally   LUNGS: no acc muscle use, clear to A and P bilaterally without cough on insp or exp maneuvers   CV:  RRR  no s3 or murmur or increase in P2, no edema   ABD:  soft and nontender with nl excursion in the supine position. No bruits or organomegaly, bowel sounds nl  MS:  warm without deformities, calf tenderness, cyanosis or  clubbing  SKIN: warm and dry without lesions    NEURO:  alert, approp, no deficits    I personally reviewed images and agree with radiology impression as follows:  CXR:  10/09/14   Monitoring leads overlie the patient. Stable cardiac and mediastinal contours. No consolidative pulmonary opacities. No pleural effusion or pneumothorax. Mid thoracic spine degenerative changes.  Dg Es 10/06/14 1. Tiny sliding hiatal hernia, not changed compared to the prior study from 2012. 2. Mild to moderate gastroesophageal reflux. Barium pill passes into the stomach without delay.    Assessment & Plan:

## 2014-10-15 ENCOUNTER — Encounter: Payer: Self-pay | Admitting: Internal Medicine

## 2014-10-15 DIAGNOSIS — J4531 Mild persistent asthma with (acute) exacerbation: Secondary | ICD-10-CM | POA: Insufficient documentation

## 2014-10-15 NOTE — Assessment & Plan Note (Addendum)
DDX of  difficult airways management all start with A and  include Adherence, Ace Inhibitors, Acid Reflux, Active Sinus Disease, Alpha 1 Antitripsin deficiency, Anxiety masquerading as Airways dz,  ABPA,  allergy(esp in young), Aspiration (esp in elderly), Adverse effects of meds,  Active smokers, A bunch of PE's (a small clot burden can't cause this syndrome unless there is already severe underlying pulm or vascular dz with poor reserve) plus two Bs  = Bronchiectasis and Beta blocker use..and one C= CHF  Adherence is always the initial "prime suspect" and is a multilayered concern that requires a "trust but verify" approach in every patient - starting with knowing how to use medications, especially inhalers, correctly, keeping up with refills and understanding the fundamental difference between maintenance and prns vs those medications only taken for a very short course and then stopped and not refilled.  -The proper method of use, as well as anticipated side effects, of a metered-dose inhaler are discussed and demonstrated to the patient. Improved effectiveness after extensive coaching during this visit to a level of approximately  75% so needs more work - totally confused with concept of maint vs prns > need to use a 2 bag approach / trust but verify   ? Acid (or non-acid) GERD > always difficult to exclude as up to 75% of pts in some series report no assoc GI/ Heartburn symptoms> rec max (24h)  acid suppression and diet restrictions/ reviewed and instructions given in writing.   ? Allergy > if not better Prednisone 10 mg take  4 each am x 2 days,   2 each am x 2 days,  1 each am x 2 days and stop and ? singulair rx next ov  ? Active sinustis > doubt/ ok to use h1 for rhinitis/ pnds   I had an extended discussion with the patient reviewing all relevant studies completed to date  X 76 m   Each maintenance medication was reviewed in detail including most importantly the difference between maintenance  and prns and under what circumstances the prns are to be triggered using an action plan format that is not reflected in the computer generated alphabetically organized AVS.    Please see instructions for details which were reviewed in writing and the patient given a copy highlighting the part that I personally wrote and discussed at today's ov.

## 2014-10-22 ENCOUNTER — Ambulatory Visit: Payer: Medicare Other | Admitting: Cardiology

## 2014-10-25 ENCOUNTER — Telehealth: Payer: Self-pay | Admitting: Internal Medicine

## 2014-10-25 DIAGNOSIS — J45909 Unspecified asthma, uncomplicated: Secondary | ICD-10-CM

## 2014-10-25 NOTE — Telephone Encounter (Signed)
lmtcb PFT has not been ordered by our provider/ no appt scheduled for PFT

## 2014-10-25 NOTE — Telephone Encounter (Signed)
Patient returned call, she can be reached at [phone].

## 2014-10-25 NOTE — Telephone Encounter (Signed)
Pt returned call [phone]

## 2014-10-25 NOTE — Telephone Encounter (Signed)
lmomtcb x1 

## 2014-10-26 NOTE — Telephone Encounter (Signed)
lmomtcb x1 

## 2014-10-26 NOTE — Addendum Note (Signed)
Addended by: Lorane Gell on: 10/26/2014 03:59 PM   Modules accepted: Orders

## 2014-10-26 NOTE — Telephone Encounter (Signed)
Pt called back; phone note had been closed. Pt wanted to make sure of her appt reason for Thursday; she was told this was for a 2 week follow up with all meds in hand with MW-pt was under the impression that she was coming in for a PFT based on her her recent ED visit, PCP visit, and GI visit. Pt is aware that no orders for PFT have been placed and that I would speak with MW to find out what exactly he wants pt to do. Pt states she is not really able to afford the OV as there are other situations at hand with her family.   After speaking with MW-okay to order PFT and have patient follow up with him still-this way she will have the results and get the healthcare treatment she needs/deserves.  I called and made appt with PFT at [facility] for Thursday 10-28-14 at 10:30am; pt has OV at 11:30 am here and may be a little late-it is okay.    Pt gave directions for me to leave a detailed message at her number listed in chart with all information. I have left a message stating we were able to schedule PFT and keep OV on Thursday. I have requested the patient call back this afternoon or tomorrow morning so I can discuss the details with her and answer any questions/concerns she may have.

## 2014-10-26 NOTE — Telephone Encounter (Signed)
Pt returning call and seems a bit confused about appointment, please advise and can be reasched @ [phone].Anne [name]

## 2014-10-27 NOTE — Telephone Encounter (Signed)
Spoke with patient- very upset with the lack of communication that she has received from our office this week regarding getting her scheduled for a PFT and OV with MW.  I have apologized to the patient for the communication issues and the inconvenience. Patient states that she was spoke with Joellen Jersey yesterday afternoon and was advised that she would receive a call back a few minutes later with appointment date/times for OV and PFT. Pt states that she advised nurse to leave detailed message on her machine with all dates/times of appts - pt states that she never heard back from [facility] and that there was a message left on her machine that she later found but did not have any dates/times in the message. Pt states that she called back this morning around 8:17am and there was supposed to have been a message given to Joellen Jersey to call her back ASAP (per [facility] this message was passed along to nurse to contact patient)- pt never received a call back.  I placed the patient on a brief hold to check appts for 10/28/14 as mentioned below to verify scheduling for confirming with patient - appt mentioned below for PFT at [facility] at 10:30 could not be verified as it was never scheduled. Called [facility] and per Baxter Flattery at [facility] there was never an appt scheduled for PFT 10/28/14 for this patient. Baxter Flattery was able to squeeze patient in and schedule at 10:30 at [facility] for PFT.   I have confirmed appt with patient for OV with MW at 11:30 with PFT at [facility] at 10:30 prior to appt.   Patient aware of appt time for tomorrow 10/28/14 for PFT at [facility] and instructions test.  Aware to go to admitting at [facility] to check in for PFT at 10:30 (arrive [phone]) for check-in process and OV with MW at [facility] at 11:30.  Pt expressed understanding, repeated appts and times back to me.  Nothing further needed.

## 2014-10-28 ENCOUNTER — Encounter: Payer: Self-pay | Admitting: Internal Medicine

## 2014-10-28 ENCOUNTER — Ambulatory Visit (HOSPITAL_COMMUNITY)
Admission: RE | Admit: 2014-10-28 | Discharge: 2014-10-28 | Disposition: A | Discharge status: Home / Self Care | Payer: Medicare Other | Source: Ambulatory Visit | Attending: Internal Medicine | Admitting: Internal Medicine

## 2014-10-28 ENCOUNTER — Ambulatory Visit (INDEPENDENT_AMBULATORY_CARE_PROVIDER_SITE_OTHER): Payer: Medicare Other | Admitting: Internal Medicine

## 2014-10-28 VITALS — BP 160/64 | HR 77 | Ht 67.0 in | Wt 159.0 lb

## 2014-10-28 DIAGNOSIS — K219 Gastro-esophageal reflux disease without esophagitis: Secondary | ICD-10-CM

## 2014-10-28 DIAGNOSIS — J449 Chronic obstructive pulmonary disease, unspecified: Secondary | ICD-10-CM

## 2014-10-28 DIAGNOSIS — J45909 Unspecified asthma, uncomplicated: Secondary | ICD-10-CM | POA: Insufficient documentation

## 2014-10-28 LAB — PULMONARY FUNCTION TEST
DL/VA % PRED: 72 %
DL/VA: 3.71 ml/min/mmHg/L
DLCO UNC: 18.63 ml/min/mmHg
DLCO unc % pred: 65 %
FEF 25-75 Post: 0.86 L/sec
FEF 25-75 Pre: 0.69 L/sec
FEF2575-%CHANGE-POST: 24 %
FEF2575-%PRED-PRE: 47 %
FEF2575-%Pred-Post: 58 %
FEV1-%CHANGE-POST: 3 %
FEV1-%Pred-Post: 94 %
FEV1-%Pred-Pre: 90 %
FEV1-Post: 1.68 L
FEV1-Pre: 1.61 L
FEV1FVC-%Change-Post: -5 %
FEV1FVC-%PRED-PRE: 76 %
FEV6-%Change-Post: 9 %
FEV6-%PRED-PRE: 120 %
FEV6-%Pred-Post: 131 %
FEV6-POST: 2.91 L
FEV6-Pre: 2.66 L
FEV6FVC-%CHANGE-POST: -1 %
FEV6FVC-%Pred-Post: 96 %
FEV6FVC-%Pred-Pre: 97 %
FVC-%Change-Post: 10 %
FVC-%PRED-POST: 135 %
FVC-%PRED-PRE: 123 %
FVC-Post: 3.13 L
FVC-Pre: 2.83 L
POST FEV6/FVC RATIO: 93 %
PRE FEV6/FVC RATIO: 94 %
Post FEV1/FVC ratio: 54 %
Pre FEV1/FVC ratio: 57 %
RV % pred: 146 %
RV: 3.78 L
TLC % pred: 122 %
TLC: 6.74 L

## 2014-10-28 MED ORDER — ALBUTEROL SULFATE (2.5 MG/3ML) 0.083% IN NEBU
2.5000 mg | INHALATION_SOLUTION | Freq: Once | RESPIRATORY_TRACT | Status: AC
  Administered 2014-10-28: 2.5 mg via RESPIRATORY_TRACT

## 2014-10-28 NOTE — Progress Notes (Signed)
Subjective:    Patient ID: Anne [name], female    DOB: 1930/05/03    MRN: [id number]    Brief patient profile:  86 yobf quit light smoking around 1986  followed by Dr Jethro Bolus prev on allergy shots x 5 years completed while he was still with this group on [place] (so prior to late 1990's) and previously maintained on Advair / and prn albuterol  but much worse early June 2016  abruptly while on Advair so stopped advair and started just using albuterol > 3 ER trips and one [facility] ov and not better dx GERD > [facility] eval for gerd but not improving > rec pulmonary eval and seen in pulmonary clinic 10/14/2014 with pfts 10/28/14 c/w GOLD I copd vs chronic asthma with fixed component     History of Present Illness  10/14/2014 1st  Pulmonary office visit/ Cayla Wiegand   Chief Complaint  Patient presents with  . Advice Only    referred by Dr. Laural Benes; Asthma flare up.  Started at the end of May.    was doing fine until 6 weeks prior to OV  With breathing bad esp @ [facility] with sense of pnds /watery/ better with with prednisone /flonase in past Sob with chest tightness "like always" better with albterol to where not sob with adls, just exertion rec Plan A = Automatic = dulera 100 Take 2 puffs first thing in am and then another 2 puffs about 12 hours later.  Plan B = Backup Only use your albuterol (proventil)  Work on inhaler technique:  relax and gently blow all the way out then take a nice smooth deep breath back in, triggering the inhaler at same time you start breathing in.  Hold for up to 5 seconds if you can.  Blow out thru nose.  Rinse and gargle with water when done Dexilant   Take  30-60 min before first meal of the day and Zantac 150  one @  bedtime until return to office - this is the best way to tell whether stomach acid is contributing to your problem.   GERD (REFLUX) diet  If not getting better  Prednisone 10 mg take  4 each am x 2 days,   2 each am x 2 days,  1 each am x 2 days and  stop  For drainage take chlortrimeton (chlorpheniramine) 4 mg every 4 hours available over the counter (may cause drowsiness - be sure to take 1 or 2 at bedtime)  Please schedule a follow up office visit in 2 weeks, sooner if needed  - bring all medications in 2 bags automatics vs as needed including over the counter meds    10/28/2014 f/u ov/Juna Caban re: did not bring meds in bags as requested./ mild irrev airflow obst on pfts  Chief Complaint  Patient presents with  . Follow-up    Pt states her breathing is unchanged.   sob:  no problem with sleeping/sometimes if walk too fast/ always when eats    No obvious day to day or daytime variability or assoc excess/ purulent sputum production  or cp or   subjective wheeze or overt hb symptoms. No unusual exp hx or h/o childhood pna/ asthma or knowledge of premature birth.  Sleeping ok without nocturnal  or early am exacerbation  of respiratory  c/o's or need for noct saba. Also denies any obvious fluctuation of symptoms with weather or environmental changes or other aggravating or alleviating factors except as outlined above  Current Medications, Allergies, Complete Past Medical History, Past Surgical History, Family History, and Social History were reviewed in [employer] record.  ROS  The following are not active complaints unless bolded sore throat, dysphagia, dental problems, itching, sneezing,  nasal congestion or excess/ purulent secretions, ear ache,   fever, chills, sweats, unintended wt loss, classically pleuritic or exertional cp, hemoptysis,  orthopnea pnd or leg swelling, presyncope, palpitations, abdominal pain, anorexia, nausea, vomiting, diarrhea  or change in bowel or bladder habits, change in stools or urine, dysuria,hematuria,  rash, arthralgias, visual complaints, headache, numbness, weakness or ataxia or problems with walking or coordination,  change in mood/affect or memory.                Objective:    Physical Exam  amb bf very  easily confused with details of care  10/28/2014        159  Wt Readings from Last 3 Encounters:  10/14/14 162 lb 9.6 oz (73.755 kg)  08/18/12 164 lb (74.39 kg)  05/31/12 155 lb (70.308 kg)    Vital signs reviewed   HEENT: nl dentition, turbinates, and orophanx. Nl external ear canals without cough reflex   NECK :  without JVD/Nodes/TM/ nl carotid upstrokes bilaterally   LUNGS: no acc muscle use, clear to A and P bilaterally without cough on insp or exp maneuvers   CV:  RRR  no s3 or murmur or increase in P2, no edema   ABD:  soft and nontender with nl excursion in the supine position. No bruits or organomegaly, bowel sounds nl  MS:  warm without deformities, calf tenderness, cyanosis or clubbing  SKIN: warm and dry without lesions    NEURO:  alert, approp, no deficits    I personally reviewed images and agree with radiology impression as follows:  CXR:  10/09/14   Monitoring leads overlie the patient. Stable cardiac and mediastinal contours. No consolidative pulmonary opacities. No pleural effusion or pneumothorax. Mid thoracic spine degenerative changes.  Dg Es 10/06/14 1. Tiny sliding hiatal hernia, not changed compared to the prior study from 2012. 2. Mild to moderate gastroesophageal reflux. Barium pill passes into the stomach without delay.    Assessment & Plan:

## 2014-10-28 NOTE — Patient Instructions (Signed)
Plan A = Automatic = dulera 100 Take 2 puffs first thing in am and then another 2 puffs about 12 hours later.   Work on inhaler technique:  relax and gently blow all the way out then take a nice smooth deep breath back in, triggering the inhaler at same time you start breathing in.  Hold for up to 5 seconds if you can.  Rinse and gargle with water when done     Plan B = Backup Only use your albuterol (proventil) as a rescue medication to be used if you can't catch your breath by resting or doing a relaxed purse lip breathing pattern.  - The less you use it, the better it will work when you need it. - Ok to use up to 2 puffs  every 4 hours if you must but call for immediate appointment if use goes up over your usual need - Don't leave home without it !!  (think of it like the spare tire for your car)   Work on inhaler technique:  relax and gently blow all the way out then take a nice smooth deep breath back in, triggering the inhaler at same time you start breathing in.  Hold for up to 5 seconds if you can.  Blow out thru nose.  Rinse and gargle with water when done  Zantac 150 after bfast and   one @  bedtime until return to office - this is the best way to tell whether stomach acid is contributing to your problem.    GERD (REFLUX)  is an extremely common cause of respiratory symptoms just like yours , many times with no obvious heartburn at all.    It can be treated with medication, but also with lifestyle changes including elevation of the head of your bed (ideally with 6 inch  bed blocks),  Smoking cessation, avoidance of late meals, excessive alcohol, and avoid fatty foods, chocolate, peppermint, colas, red wine, and acidic juices such as orange juice.  NO MINT OR MENTHOL PRODUCTS SO NO COUGH DROPS  USE SUGARLESS CANDY INSTEAD (Jolley ranchers or Stover's or Life Savers) or even ice chips will also do - the key is to swallow to prevent all throat clearing. NO OIL BASED VITAMINS - use powdered  substitutes.  For nasal drainage stop zyrtec and take chlortrimeton (chlorpheniramine) 4 mg every 4 hours available over the counter (may cause drowsiness - be sure to take 1 or 2 at bedtime)   Please schedule a follow up office visit in 2 weeks, sooner if needed  - bring all medications in 2 bags automatics vs as needed including over the counter meds

## 2014-11-02 ENCOUNTER — Encounter: Payer: Self-pay | Admitting: Internal Medicine

## 2014-11-02 DIAGNOSIS — J449 Chronic obstructive pulmonary disease, unspecified: Secondary | ICD-10-CM | POA: Insufficient documentation

## 2014-11-02 DIAGNOSIS — K219 Gastro-esophageal reflux disease without esophagitis: Secondary | ICD-10-CM | POA: Insufficient documentation

## 2014-11-02 NOTE — Assessment & Plan Note (Signed)
-   10/14/2014 d/c advair and  try dulera 100 2bid  - 10/28/14 /2016 p extensive coaching HFA effectiveness =    75%

## 2014-11-02 NOTE — Assessment & Plan Note (Signed)
Reviewed Dg Es 10/06/14 in detail with pt > max gerd rx reviewed  - See instructions for specific recommendations which were reviewed directly with the patient who was given a copy with highlighter outlining the key components.

## 2014-11-02 NOTE — Assessment & Plan Note (Addendum)
-   quit smoking 1986 though minimizes this when asked - 10/14/2014 d/c advair and  try dulera 100 2bid    - PFTs 10/28/14 FEV1 1.68 (94%) ratio 54 and no better p saba/ dlco 65 corrects to 72 for alv vol   The proper method of use, as well as anticipated side effects, of a metered-dose inhaler are discussed and demonstrated to the patient. Improved effectiveness after extensive coaching during this visit to a level of approximately  75% so continue dulera for now at 100 2bid  I had an extended discussion with the patient reviewing all relevant studies completed to date and  lasting 15 to 20 minutes of a 25 minute visit    1) the pfts look more like copd than asthma but for now the rx is the same  2) she clearly has GERD on DgEs which can make either dx look worse than it is (see GERD) a/p   3) Unlike when you get a prescription for eyeglasses, it's not possible to always walk out of this or any medical office with a perfect prescription that is immediately effective  based on any test that we offer here.    On the contrary, it may take several weeks for the full impact of changes recommened today - hopefully you will respond well.  If not, then we'll adjust your medication on your next visit accordingly, knowing more then than we can possibly know now.   Next step is med reconciliation :  To keep things simple, I have asked the patient to first separate medicines that are perceived as maintenance, that is to be taken daily "no matter what", from those medicines that are taken on only on an as-needed basis and I have given the patient examples of both today.  Once we have done a trust but verify ov then we can move forward with other options to control her symtoms.      Each maintenance medication was reviewed in detail including most importantly the difference between maintenance and prns and under what circumstances the prns are to be triggered using an action plan format that is not reflected in  the computer generated alphabetically organized AVS.    Please see instructions for details which were reviewed in writing and the patient given a copy highlighting the part that I personally wrote and discussed at today's ov.

## 2014-11-04 ENCOUNTER — Telehealth: Payer: Self-pay | Admitting: Internal Medicine

## 2014-11-04 NOTE — Telephone Encounter (Signed)
Spoke with pt. She wants to switch from MW to MR. She saw MR in 2013. Please advise MW and MR thanks

## 2014-11-04 NOTE — Telephone Encounter (Signed)
Fine with me but would insist she bring all active meds with her - this may be why she didn't like the last ov with me as she failed to follow written instruction from the previous ov and I let her know that I could not provide her quality care in this setting.

## 2014-11-04 NOTE — Telephone Encounter (Signed)
Pt called back and reports she is not "switching" bc MR was always her provider. She only saw MW bc MR had nothing available. She refuses to see MW stating she does not "like him".  Pt reports she is releasing herself from [facility] She wants appt with MR. She cancelled pending appt. Still made pt aware of our protocol that the ok will still need to come from both docs.

## 2014-11-04 NOTE — Telephone Encounter (Signed)
Please advise MR thanks 

## 2014-11-07 NOTE — Telephone Encounter (Signed)
That is fine for switch. 1st avail please

## 2014-11-08 NOTE — Telephone Encounter (Signed)
Called, spoke with pt.  MR's first available is Sept 22 at this time.  Pt states she has been seen in the ED x 3 in the last 2 months.  States she doesn't feel like Ruthe Mannan is helping SOB and wheezing.  Pt would like to come in prior to Sept 22 and is in agreement with seeing TP to discuss symptoms with f/u with MR afterwards.  Pt scheduled to see TP on Nov 10, 2014 at 11:30 am.  Pt is aware f/u with MR will be scheduled after appt with TP.  Pt confirmed appt and is aware to bring all meds to visit.  She is aware to call back or seek emergency care if needed prior to this.  She verbalized understanding, is in agreement with plan, and voiced no further questions or concerns at this time.

## 2014-11-10 ENCOUNTER — Ambulatory Visit (INDEPENDENT_AMBULATORY_CARE_PROVIDER_SITE_OTHER): Payer: Medicare Other | Admitting: Adult Health

## 2014-11-10 ENCOUNTER — Encounter: Payer: Self-pay | Admitting: Adult Health

## 2014-11-10 VITALS — BP 130/60 | HR 83 | Temp 97.9°F | Ht 67.0 in | Wt 156.0 lb

## 2014-11-10 DIAGNOSIS — R0789 Other chest pain: Secondary | ICD-10-CM

## 2014-11-10 DIAGNOSIS — J449 Chronic obstructive pulmonary disease, unspecified: Secondary | ICD-10-CM

## 2014-11-10 MED ORDER — BUDESONIDE-FORMOTEROL FUMARATE 160-4.5 MCG/ACT IN AERO: 2.0000 | INHALATION_SPRAY | Freq: Two times a day (BID) | RESPIRATORY_TRACT | Status: DC

## 2014-11-10 NOTE — Patient Instructions (Signed)
Change Dulera to Symbicort 160 2 puffs Twice daily  , rinse after use.  GERD diet  Continue on Prilosec daily  Continue on Zantac 150mg  At bedtime   Refer to Cardiology .  Follow up Dr. Chase Caller in 4 weeks and As needed   Please contact office for sooner follow up if symptoms do not improve or worsen or seek emergency care

## 2014-11-11 ENCOUNTER — Ambulatory Visit: Payer: Medicare Other | Admitting: Internal Medicine

## 2014-11-11 NOTE — Progress Notes (Signed)
Subjective:    Patient ID: Anne [name], female    DOB: 10-10-1930    MRN: [id number]    Brief patient profile:  72 yobf quit light smoking around 1986  followed by Dr Jethro Bolus prev on allergy shots x 5 years completed while he was still with this group on [place] (so prior to late 1990's) and previously maintained on Advair / and prn albuterol  but much worse early June 2016  abruptly while on Advair so stopped advair and started just using albuterol > 3 ER trips and one [facility] ov and not better dx GERD > [facility] eval for gerd but not improving > rec pulmonary eval and seen in pulmonary clinic 10/14/2014 with pfts 10/28/14 c/w GOLD I copd vs chronic asthma with fixed component     History of Present Illness  10/14/2014 1st [place] [facility] office visit/ Wert   Chief Complaint  Patient presents with  . Advice Only    referred by Dr. Laural Benes; Asthma flare up.  Started at the end of May.    was doing fine until 6 weeks prior to OV  With breathing bad esp @ [facility] assoc with sense of pnds /watery/ better with with prednisone /flonase in past Sob with chest tightness "like always" better with albterol to where not sob with adls, just exertion rec Plan A = Automatic = dulera 100 Take 2 puffs first thing in am and then another 2 puffs about 12 hours later.  Plan B = Backup Only use your albuterol (proventil)  Work on inhaler technique:  relax and gently blow all the way out then take a nice smooth deep breath back in, triggering the inhaler at same time you start breathing in.  Hold for up to 5 seconds if you can.  Blow out thru nose.  Rinse and gargle with water when done Dexilant   Take  30-60 min before first meal of the day and Zantac 150  one @  bedtime until return to office - this is the best way to tell whether stomach acid is contributing to your problem.   GERD (REFLUX) diet  If not getting better  Prednisone 10 mg take  4 each am x 2 days,   2 each am x 2 days,  1 each am x 2 days and  stop  For drainage take chlortrimeton (chlorpheniramine) 4 mg every 4 hours available over the counter (may cause drowsiness - be sure to take 1 or 2 at bedtime)  Please schedule a follow up office visit in 2 weeks, sooner if needed  - bring all medications in 2 bags automatics vs as needed including over the counter meds    10/28/2014 f/u ov/Wert re: did not bring meds in bags as requested./ mild irrev airflow obst on pfts  Chief Complaint  Patient presents with  . Follow-up    Pt states her breathing is unchanged.   sob:  no problem with sleeping/sometimes if walk too fast/ always when eats   >Dulera and Zyrtec changed to Chlortrimeton    11/11/2014 Follow up : Dyspnea Pt returns for 2 week follow up .  Seen for 1 month ago for increased dyspnea for last few months. PFT showed mild COPD /chronic asthma with FEV1 94%, ratio 54. Decreased DLCO .  She was changed from Advair to [facility] .  She has not seen any change in level of dyspnea  Gets winded with activity with associated chest tightness.  Seen in ER x  3 with 3 CXR showing no acute process.  Underwent esophagram that showed tiny sliding hiatal hernia and mild to mod GERD .  She was placed on GERD tx and diet .  Troponin was neg and BNP not elevated .  Has seen cardiology , Dr. Garnette Scheuermann in past , no records  Available.  We discussed referral to cardiology as has ongoing chest tightness.  Denies syncope, hemoptysis , calf pain, or leg swelling  No fever, discolored mucus , or orthopnea.  Minimal dry cough .  Does not like Dulera , does not fill it helps.  Previously on Advair with good reported compliance .  Frequent SABA use on Advair .   She is aggravated at todays visit as she thought she was suppose to see Dr. Marchelle Gearing . Support provided.    Current Medications, Allergies, Complete Past Medical History, Past Surgical History, Family History, and Social History were reviewed in [employer]  record.  ROS  The following are not active complaints unless bolded sore throat, dysphagia, dental problems, itching, sneezing,  nasal congestion or excess/ purulent secretions, ear ache,   fever, chills, sweats, unintended wt loss, classically pleuritic or exertional cp, hemoptysis,  orthopnea pnd or leg swelling, presyncope, palpitations, abdominal pain, anorexia, nausea, vomiting, diarrhea  or change in bowel or bladder habits, change in stools or urine, dysuria,hematuria,  rash, arthralgias, visual complaints, headache, numbness, weakness or ataxia or problems with walking or coordination,  change in mood/affect or memory.                Objective:   Physical Exam  amb bf very    10/28/2014        159  > 156 11/10/14  Vital signs reviewed   HEENT: nl dentition, turbinates, and orophanx. Nl external ear canals without cough reflex   NECK :  without JVD/Nodes/TM/ nl carotid upstrokes bilaterally   LUNGS: no acc muscle use, clear to A and P bilaterally without cough on insp or exp maneuvers   CV:  RRR  no s3 or murmur or increase in P2, no edema   ABD:  soft and nontender with nl excursion in the supine position. No bruits or organomegaly, bowel sounds nl  MS:  warm without deformities, calf tenderness, cyanosis or clubbing  SKIN: warm and dry without lesions    NEURO:  alert, approp, no deficits      CXR:  10/09/14   Monitoring leads overlie the patient. Stable cardiac and mediastinal contours. No consolidative pulmonary opacities. No pleural effusion or pneumothorax. Mid thoracic spine degenerative changes.  Dg Es 10/06/14 1. Tiny sliding hiatal hernia, not changed compared to the prior study from 2012. 2. Mild to moderate gastroesophageal reflux. Barium pill passes into the stomach without delay.    Assessment & Plan:

## 2014-11-15 ENCOUNTER — Ambulatory Visit (INDEPENDENT_AMBULATORY_CARE_PROVIDER_SITE_OTHER): Payer: Medicare Other | Admitting: Internal Medicine

## 2014-11-15 ENCOUNTER — Encounter: Payer: Self-pay | Admitting: Internal Medicine

## 2014-11-15 VITALS — BP 146/64 | HR 81 | Ht 67.0 in | Wt 157.0 lb

## 2014-11-15 DIAGNOSIS — R0689 Other abnormalities of breathing: Secondary | ICD-10-CM

## 2014-11-15 DIAGNOSIS — R0600 Dyspnea, unspecified: Secondary | ICD-10-CM | POA: Diagnosis not present

## 2014-11-15 NOTE — Patient Instructions (Addendum)
ICD-9-CM ICD-10-CM   1. Dyspnea and respiratory abnormality 786.09 R06.00     R06.89     Continue Symbicort  For now Do HRCT chest wo contrast Will message dR Tamala Julian cards about  Stress test  Followup  - will call with CT result

## 2014-11-15 NOTE — Progress Notes (Signed)
Subjective:    Patient ID: Anne [name], female    DOB: 18-Jul-1930, 81 y.o.   MRN: [id number]  HPI    History of Present Illness:  OV 07/21/2007: Follouwp after bronchoscopy on 07/09/2007 for scattered GGO and RML chronic collapse. Had BAL RML nad LL - both show lot of polys and growing pseudomonas. AFB negative so far. Here to discuss results. Reclarified symptoms: only symptoms is atypical precordial pain. No cough. No wheezing. No sputum. No dyspnea. Admits to sinus drainage. Of note, few days befor bronch had yellow sinus drainage. Currently sinus drainage is clear. No interim complaints. REC: 2 week cipro   OV 06/04/2008: She was lost to followup for nearly a year. She is not sure if our office did not make appt or send letter or she just forgot. In any event, she does not think that cipro given at last visit for pesudomonas isolated on BAL helped. She currently feels very well and is at baseline. The atypical precordial pain is largely resolved. Last episode was 2 weeks ago and before that 6 months ago. She otherwise   OV 06/24/2009: Followup pulmonary nodule/GGO and mediastinal nodes. Since visit last year no complaints. The vague precordial pain has recurred. Very atypical and chronic. She denies cough, fever, hemoptysis, chills, nausea, vomitting, diarrhea, edema. She also denis sinus drainage. She wants to review her CT scan done on 05/31/2009. CT shows that pre-tracheal node has incresed from 1.8cm -> 1.9cm and AP node has increased from 1.3 -> 1.8cm. Nodules are stable. REC: PET SCAN   OV 07/15/2009: Followup pulmonary nodule/GGO and mediastinal nodes. She had PET [facility] 07/08/2009. Today's visit is just to disucss results. No interim complaints. PET scan shows mediastinal nodes are PET HOT but similar in size to the CT scan chest. The pulmonmary nodes due to small size are not pet active. There is no other PET activitiy elsewhere other than normal.   Patient Instructions:  1) I am going to talk  to Dr. Edwyna Shell and then call you back about next step  2) ..............  3) UPDATE: See Dr. Edwyna Shell first. Then procedures will be explained and scheduled. Followup after bx   MEdiastinaoscopy 07/27/2009 - Benign node of 4R and 4L. ANthracotoic pigment +. No granuoma. Non malignant  OV 06/18/2011  Followup for above. She was recommended a 5 year CT which is due now but she is questoning the need (CTs 8.28.08, 06/16/07, 06/11/08, and 06/09/2009). She is worried about radiation exposure. AT same time sshe is conflicted and wants cancer ruled out for sure (we discussed the low prob of BAC). No new complaints. Just occasional mild dyspnea when she lies down and constant sinus drainage.  Past, Family, Social reviewed: no change since last visit. Husband now with advanced dementia    Brief patient profile:  9 yobf quit light smoking around 1986 followed by Dr Jethro Bolus prev on allergy shots x 5 years completed while he was still with this group on [place] (so prior to late 1990's) and previously maintained on Advair / and prn albuterol but much worse early June 2016 abruptly while on Advair so stopped advair and started just using albuterol > 3 ER trips and one [facility] ov and not better dx GERD > [facility] eval for gerd but not improving > rec pulmonary eval and seen in pulmonary clinic 10/14/2014 with pfts 10/28/14 c/w GOLD I copd vs chronic asthma with fixed component    History of Present Illness  10/14/2014 1st [place]  [facility]ulmonary office visit/ Scientist, physiological Complaint  Patient presents with  . Advice Only    referred by Dr. Laural Benes; Asthma flare up. Started at the end of May.   was doing fine until 6 weeks prior to OV With breathing bad esp @ [facility] with sense of pnds /watery/ better with with prednisone /flonase in past Sob with chest tightness "like always" better with albterol to where not sob with adls, just exertion rec Plan A = Automatic = dulera 100 Take 2 puffs first thing  in am and then another 2 puffs about 12 hours later.  Plan B = Backup Only use your albuterol (proventil)  Work on inhaler technique: relax and gently blow all the way out then take a nice smooth deep breath back in, triggering the inhaler at same time you start breathing in. Hold for up to 5 seconds if you can. Blow out thru nose. Rinse and gargle with water when done Dexilant Take 30-60 min before first meal of the day and Zantac 150 one @ bedtime until return to office - this is the best way to tell whether stomach acid is contributing to your problem.  GERD (REFLUX) diet  If not getting better  Prednisone 10 mg take 4 each am x 2 days, 2 each am x 2 days, 1 each am x 2 days and stop  For drainage take chlortrimeton (chlorpheniramine) 4 mg every 4 hours available over the counter (may cause drowsiness - be sure to take 1 or 2 at bedtime)  Please schedule a follow up office visit in 2 weeks, sooner if needed - bring all medications in 2 bags automatics vs as needed including over the counter meds    10/28/2014 f/u ov/Wert re: did not bring meds in bags as requested./ mild irrev airflow obst on pfts  Chief Complaint  Patient presents with  . Follow-up    Pt states her breathing is unchanged.   sob: no problem with sleeping/sometimes if walk too fast/ always when eats   11/11/2014 Follow up   Aggravated  Switching from Dr. Sherene Sires To Dr. Marchelle Gearing .           OV 11/15/2014  Chief Complaint  Patient presents with  . Follow-up    Former MR pt that was seeing MW - pt has now switched from MW back to MR. Pt states her breathing is not at baseline. Pt c/o DOE, throat clearing, chest congestion x 2.5 months.    81 year old female follow-up for dyspnea. Last seen by myself 3 years ago for mediastinal adenopathy. At the time she was having some vague precordial pain. Since then the pain resolved. She never followed up. Most recently for the last several  months she's had insidious onset of dyspnea with some chest pressure. There is some associated throat clearing and sinus drainage but she denies any cough or wheezing or nocturnal awakening. Dyspnea is worsened with exertion such as climbing flight of stairs and relieved by inhaler. However the relief is not full. Symptoms are moderate in intensity and stable since its onset. It is no associated weight loss. Pulmonary function test July 2000 610 shows isolated reduction in diffusion capacity 65%. She did have a coronary CT in 2008 that shows calcium deposits in the left anterior descending artery. She does not recollect any stress test. She has seen Dr. Garnette Scheuermann many years ago according to her history for costochondritis. She has another appointment with him in October 2016 and is wondering  about doing it sooner.  She was supposed to have CT scan of the chest for ground glass opacities but has not had one since the last few years due to fear of radiation risk but at this point in time given her symptoms she is willing to have it   Walking desat test 11/15/2014 - no desaturation on room air after walking 185 feet 3 laps with a full head probe   Review of Systems  Constitutional: Negative for fever and unexpected weight change.  HENT: Negative for congestion, dental problem, ear pain, nosebleeds, postnasal drip, rhinorrhea, sinus pressure, sneezing, sore throat and trouble swallowing.   Eyes: Negative for redness and itching.  Respiratory: Positive for cough and shortness of breath. Negative for chest tightness and wheezing.   Cardiovascular: Negative for palpitations and leg swelling.  Gastrointestinal: Negative for nausea and vomiting.  Genitourinary: Negative for dysuria.  Musculoskeletal: Negative for joint swelling.  Skin: Negative for rash.  Neurological: Negative for headaches.  Hematological: Does not bruise/bleed easily.  Psychiatric/Behavioral: Negative for dysphoric mood. The patient  is not nervous/anxious.        Objective:   Physical Exam  Constitutional: She is oriented to person, place, and time. She appears well-developed and well-nourished. No distress.  HENT:  Head: Normocephalic and atraumatic.  Right Ear: External ear normal.  Left Ear: External ear normal.  Mouth/Throat: Oropharynx is clear and moist. No oropharyngeal exudate.  Eyes: Conjunctivae and EOM are normal. Pupils are equal, round, and reactive to light. Right eye exhibits no discharge. Left eye exhibits no discharge. No scleral icterus.  Neck: Normal range of motion. Neck supple. No JVD present. No tracheal deviation present. No thyromegaly present.  Cardiovascular: Normal rate, regular rhythm, normal heart sounds and intact distal pulses.  Exam reveals no gallop and no friction rub.   No murmur heard. Pulmonary/Chest: Effort normal and breath sounds normal. No respiratory distress. She has no wheezes. She has no rales. She exhibits no tenderness.  Abdominal: Soft. Bowel sounds are normal. She exhibits no distension and no mass. There is no tenderness. There is no rebound and no guarding.  Musculoskeletal: Normal range of motion. She exhibits no edema or tenderness.  Lymphadenopathy:    She has no cervical adenopathy.  Neurological: She is alert and oriented to person, place, and time. She has normal reflexes. No cranial nerve deficit. She exhibits normal muscle tone. Coordination normal.  Skin: Skin is warm and dry. No rash noted. She is not diaphoretic. No erythema. No pallor.  Psychiatric: She has a normal mood and affect. Her behavior is normal. Judgment and thought content normal.  Vitals reviewed.    Filed Vitals:   11/15/14 1428  BP: 146/64  Pulse: 81  Height: 5\' 7"  (1.702 m)  Weight: 157 lb (71.215 kg)  SpO2: 98%        Assessment & Plan:     ICD-9-CM ICD-10-CM   1. Dyspnea and respiratory abnormality 786.09 R06.00 CT Chest High Resolution    R06.89    Unclear cause for  dyspnea. By history this certainly an asthma component but the asthma diagnosis is never been established. Other differential diagnosis for dyspnea include angina equivalent although risk is low, a change in infiltrates but based on the lack of desaturation the odds of this is low, physical deconditioning, D diastolic dysfunction  Plan  For now continue Symbicort Do high resolution CT scan of the chest without contrast rule out interstitial lung disease and also evaluate status of old  infiltrates from several years ago I have sent a message to Dr. Garnette Scheuermann about getting a cardiac stress test  Follow-up - Depending on the CT scan results - might need to get a pulmonary stress test or initiated workup for findings of the CT scan   > 50% of this > 25 min visit spent in face to face counseling or coordination of care    Dr. Kalman Shan, M.D., [facility].C.P Pulmonary and Critical Care Medicine Staff Physician [facility] Pager: [phone], If no answer or between  15:00h - 7:00h: call [phone]  11/16/2014 10:16 AM

## 2014-11-16 ENCOUNTER — Telehealth: Payer: Self-pay | Admitting: Internal Medicine

## 2014-11-16 DIAGNOSIS — R0789 Other chest pain: Secondary | ICD-10-CM | POA: Insufficient documentation

## 2014-11-16 NOTE — Assessment & Plan Note (Signed)
Ongoing chest tightness and DOE Unclear if related to poorly controlled asthma/COPD vs other etiology  Will refer to cardiology for further evaluation  Cont w/ maixmum control of asthma , GERD and Rhinitis  ER w/up with neg cxr , tropoinin , and bnp  Esophagram c/w GERD -could be contributing factor   Plan  Change Dulera to Symbicort 160 2 puffs Twice daily  , rinse after use.  GERD diet  Continue on Prilosec daily  Continue on Zantac 150mg  At bedtime   Refer to Cardiology .  Follow up Dr. Chase Caller in 4 weeks and As needed   Please contact office for sooner follow up if symptoms do not improve or worsen or seek emergency care

## 2014-11-16 NOTE — Telephone Encounter (Signed)
Thanks. We will contact her.

## 2014-11-16 NOTE — Assessment & Plan Note (Signed)
COPD vs chronic asthma with smoking hx Symptomatic on Adair and Dulera  Cont to control for triggers of drainage and reflux Change to symbicort   Plan  Change Dulera to Symbicort 160 2 puffs Twice daily  , rinse after use.  GERD diet  Continue on Prilosec daily  Continue on Zantac 150mg  At bedtime   .  Follow up Dr. Chase Caller in 4 weeks and As needed   Please contact office for sooner follow up if symptoms do not improve or worsen or seek emergency care

## 2014-11-16 NOTE — Telephone Encounter (Signed)
Hi [name]  Anne [name] is a patient of yours from > 3 years ago. She is having unexplained dyspnea. She might need a cardiac stress test or an echo depending on your evaluation. She is extremely worried that her office appointment with you is not until October 2016.   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

## 2014-11-17 NOTE — Telephone Encounter (Signed)
Thanks much. Will close note. No need to reply

## 2014-11-19 ENCOUNTER — Ambulatory Visit (INDEPENDENT_AMBULATORY_CARE_PROVIDER_SITE_OTHER)
Admission: RE | Admit: 2014-11-19 | Discharge: 2014-11-19 | Disposition: A | Discharge status: Home / Self Care | Payer: Medicare Other | Source: Ambulatory Visit | Attending: Internal Medicine | Admitting: Internal Medicine

## 2014-11-19 ENCOUNTER — Other Ambulatory Visit: Payer: Self-pay

## 2014-11-19 DIAGNOSIS — R0689 Other abnormalities of breathing: Secondary | ICD-10-CM

## 2014-11-19 DIAGNOSIS — R0600 Dyspnea, unspecified: Secondary | ICD-10-CM | POA: Diagnosis not present

## 2014-11-19 NOTE — Progress Notes (Signed)
Myoview ordered per Dr.Smith

## 2014-11-24 ENCOUNTER — Telehealth: Payer: Self-pay | Admitting: Internal Medicine

## 2014-11-24 ENCOUNTER — Telehealth (HOSPITAL_COMMUNITY): Payer: Self-pay | Admitting: *Deleted

## 2014-11-24 NOTE — Telephone Encounter (Signed)
Patient given detailed instructions per Myocardial Perfusion Study Information Sheet for test on 11/26/14 at 1130. Patient Notified to arrive 15 minutes early, and that it is imperative to arrive on time for appointment to keep from having the test rescheduled. Patient verbalized understanding. Danyka Merlin, [place]

## 2014-11-24 NOTE — Telephone Encounter (Signed)
Spoke with the pt  I advised will forward msg to MR to ask about ct results  She verbalized understanding  Please advise thanks

## 2014-11-25 NOTE — Telephone Encounter (Signed)
lmtcb for pt.  

## 2014-11-25 NOTE — Telephone Encounter (Signed)
PT returned call, call at [phone].

## 2014-11-25 NOTE — Telephone Encounter (Signed)
lmomtcb for pt on both # provided below

## 2014-11-25 NOTE — Telephone Encounter (Signed)
Spoke with pt.  Discussed below per MR.  Pt verbalized understanding.    MR, pt states she has a nuclear med stress test tomorrow but wasn't aware of a CPST.  She is asking for additional clarification.  Please advise.  Thank you.

## 2014-11-25 NOTE — Telephone Encounter (Signed)
Called spoke with pt. Made her aware MR will return to the office on Monday. Pt was upset she has not received any results and was told on 8/19 by the radiologists MR would have the results within 1 hr. Please advise MR thanks

## 2014-11-25 NOTE — Telephone Encounter (Signed)
Nuclear stress test is for heart  - ordered by Dr Pernell Dupre  CPST bike stress test -> is pulmnary stress test that I told her she might need if CT is ok She sits on bike and does it for 12 minutes but I want to do it now provided nuclear medicine stress test is normal (at last visit thiswas not going to happen till oct 2016 but it looks like it is moved up)  So, just give her fu appt in few wweeks to see me or TP; nd we can go over this. By the time nuc med stress test results will be back  If you feel I need to call her let me know

## 2014-11-25 NOTE — Telephone Encounter (Signed)
LEt TANIS HENSARLING know that compared to 2011 which is 5 years ago absolutely NO CHANGES on CT. All findings are old which is a) small nodule that is unchanged in 5 years and b) small Right middle lobe lung collapse unchanged in 5 years (she might have forgotten about this); c) otherwise lungs look healthy  My rec  1. Do CPST bike test with EIB challenge - I dw her at [place] and she agreed. 2. Let her know that i d/w Dr Tamala Julian who will see her about this  Return to see me after CPST  IMPRESSION: 1. No evidence of interstitial lung disease. 2. 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 3. No acute findings in the thorax. 4. Atherosclerosis, including left main and 2 vessel coronary artery disease. 5. Additional incidental findings, as above.   Electronically Signed  By: Vinnie Langton M.D.  On: 11/19/2014 10:52

## 2014-11-25 NOTE — Telephone Encounter (Signed)
The patient called again and is VERY UPSET she has not heard back, please call at [phone] or [phone].

## 2014-11-26 ENCOUNTER — Ambulatory Visit (HOSPITAL_COMMUNITY): Payer: Medicare Other | Attending: Interventional Cardiology

## 2014-11-26 DIAGNOSIS — R0600 Dyspnea, unspecified: Secondary | ICD-10-CM | POA: Diagnosis not present

## 2014-11-26 DIAGNOSIS — R9439 Abnormal result of other cardiovascular function study: Secondary | ICD-10-CM | POA: Insufficient documentation

## 2014-11-26 DIAGNOSIS — R079 Chest pain, unspecified: Secondary | ICD-10-CM | POA: Diagnosis not present

## 2014-11-26 LAB — MYOCARDIAL PERFUSION IMAGING
CHL CUP NUCLEAR SRS: 1
CHL CUP RESTING HR STRESS: 69 {beats}/min
CSEPED: 7 min
Estimated workload: 8.5 METS
Exercise duration (sec): 0 s
LV dias vol: 74 mL
LVSYSVOL: 21 mL
MPHR: 139 {beats}/min
Peak HR: 142 {beats}/min
Percent HR: 1 %
RATE: 0.26
RPE: 16
SDS: 4
SSS: 5
TID: 0.84

## 2014-11-26 MED ORDER — TECHNETIUM TC 99M SESTAMIBI GENERIC - CARDIOLITE
9.9000 | Freq: Once | INTRAVENOUS | Status: AC | PRN
  Administered 2014-11-26: 10 via INTRAVENOUS

## 2014-11-26 MED ORDER — REGADENOSON 0.4 MG/5ML IV SOLN: 0.4000 mg | Freq: Once | INTRAVENOUS | Status: DC

## 2014-11-26 MED ORDER — TECHNETIUM TC 99M SESTAMIBI GENERIC - CARDIOLITE
29.6000 | Freq: Once | INTRAVENOUS | Status: AC | PRN
  Administered 2014-11-26: 30 via INTRAVENOUS

## 2014-11-26 NOTE — Telephone Encounter (Signed)
Called and spoke to pt. Informed her of the recs per MR. Appt made with MR on 9.22.16. Pt verbalized understanding and denied any further questions or concerns at this time.

## 2014-12-01 ENCOUNTER — Telehealth: Payer: Self-pay | Admitting: Interventional Cardiology

## 2014-12-01 NOTE — Telephone Encounter (Signed)
Pt aware of myoview results with verbal understanding. Study is low rsik and very reassuring that no significant blockages are present. Pt verbalized understanding. Pt rqst a copy of results. Results fwd to [facility]

## 2014-12-01 NOTE — Telephone Encounter (Signed)
New problem    Pt returning call from nurse from yesterday.

## 2014-12-01 NOTE — Telephone Encounter (Signed)
F/u   Pt returning Lisa's phone call

## 2014-12-01 NOTE — Telephone Encounter (Signed)
Called to give pt [email or web address]

## 2014-12-23 ENCOUNTER — Encounter: Payer: Self-pay | Admitting: Internal Medicine

## 2014-12-23 ENCOUNTER — Ambulatory Visit (INDEPENDENT_AMBULATORY_CARE_PROVIDER_SITE_OTHER): Payer: Medicare Other | Admitting: Internal Medicine

## 2014-12-23 VITALS — BP 138/60 | HR 80 | Ht 67.0 in | Wt 159.0 lb

## 2014-12-23 DIAGNOSIS — R0600 Dyspnea, unspecified: Secondary | ICD-10-CM | POA: Diagnosis not present

## 2014-12-23 DIAGNOSIS — R0689 Other abnormalities of breathing: Secondary | ICD-10-CM

## 2014-12-23 MED ORDER — BECLOMETHASONE DIPROPIONATE 80 MCG/ACT IN AERS: 2.0000 | INHALATION_SPRAY | Freq: Two times a day (BID) | RESPIRATORY_TRACT | Status: DC

## 2014-12-23 NOTE — Patient Instructions (Signed)
ICD-9-CM ICD-10-CM   1. Dyspnea and respiratory abnormality 786.09 R06.00     R06.89     Glad you are better STop symbicort Start QVAR or floven 2 puff twice daily  FLu shot with PCP next week   Followup 2 months with Dr Chase Caller  - FeNO test in office at followup

## 2014-12-23 NOTE — Progress Notes (Signed)
Subjective:    Patient ID: Anne [name], female    DOB: 07/23/1930, 81 y.o.   MRN: [id number]  HPI  OV 12/23/2014  Chief Complaint  Patient presents with  . Follow-up    Pt here after the CT chest and myocardial perfusion study. Pt states her breathing is doing well. Pt states when she swallows she feels there is a bloackage.  Pt denies SOB, cough, and CP/tightness.    Follow-up dyspnea  - At last visit I had her do CT scan of the chest which did not show any change compared to the chronic changes she had several years ago. She also underwent nuclear medicine cardiac stress test in interim. I did a review the results of these are normal. In the interim her dyspnea has significantly improved. She is feeling better. She is on ICS/LABA Symbicort for few months. Prior to that she was on Advair for a few years. She is worried about the side effects of long-acting beta agonists and wants to reduce down to inhaled steroidal. She is not worried about side effects of ICS. The basis of her dyspnea is unknown. Asthma diagnosis was never established. The treatment with inhalers are completely empiric. I tried to rationalize about stopping inhalers altogether but she's not willing to undergo the strategy. However she only wants to give up long-acting beta agonist due to cardiac side effects. She will have a flu shot with her primary care physician next week   reports that she has never smoked. She has never used smokeless tobacco.    Current outpatient prescriptions:  .  albuterol (PROVENTIL HFA;VENTOLIN HFA) 108 (90 BASE) MCG/ACT inhaler, Inhale 2 puffs into the lungs every 6 (six) hours as needed for wheezing., Disp: , Rfl:  .  budesonide-formoterol (SYMBICORT) 160-4.5 MCG/ACT inhaler, Inhale 2 puffs into the lungs 2 (two) times daily., Disp: 1 Inhaler, Rfl: 5 .  Calcium Carb-Cholecalciferol (CALCIUM 600 + D) 600-200 MG-UNIT TABS, Take 1 tablet by mouth daily., Disp: , Rfl:  .  cetirizine (ZYRTEC) 10  MG tablet, Take 10 mg by mouth every morning., Disp: , Rfl:  .  fluticasone (FLONASE) 50 MCG/ACT nasal spray, Place 1 spray into both nostrils every 4 (four) hours., Disp: , Rfl:  .  latanoprost (XALATAN) 0.005 % ophthalmic solution, Place 1 drop into both eyes at bedtime., Disp: , Rfl:  .  Multiple Vitamin (MULTIVITAMIN) tablet, Take 1 tablet by mouth every morning. , Disp: , Rfl:  .  pantoprazole (PROTONIX) 20 MG tablet, Take 20 mg by mouth daily., Disp: , Rfl:  .  pravastatin (PRAVACHOL) 20 MG tablet, Take 20 mg by mouth every evening. , Disp: , Rfl:  .  ranitidine (ZANTAC) 150 MG tablet, Take 150 mg by mouth at bedtime., Disp: , Rfl:  .  TURMERIC PO, Take 1 capsule by mouth daily., Disp: , Rfl:  No current facility-administered medications for this visit.  Facility-Administered Medications Ordered in Other Visits:  .  regadenoson (LEXISCAN) injection SOLN 0.4 mg, 0.4 mg, Intravenous, Once, Quintella Reichert, MD   Immunization History  Administered Date(s) Administered  . Influenza Whole 04/04/2009  . Influenza-Unspecified 02/14/2014  . Pneumococcal Polysaccharide-23 06/01/2003      Review of Systems  Constitutional: Negative for fever and unexpected weight change.  HENT: Negative for congestion, dental problem, ear pain, nosebleeds, postnasal drip, rhinorrhea, sinus pressure, sneezing, sore throat and trouble swallowing.   Eyes: Negative for redness and itching.  Respiratory: Negative for cough, chest tightness, shortness of  breath and wheezing.   Cardiovascular: Negative for palpitations and leg swelling.  Gastrointestinal: Negative for nausea and vomiting.  Genitourinary: Negative for dysuria.  Musculoskeletal: Negative for joint swelling.  Skin: Negative for rash.  Neurological: Negative for headaches.  Hematological: Does not bruise/bleed easily.  Psychiatric/Behavioral: Negative for dysphoric mood. The patient is not nervous/anxious.        Objective:   Physical Exam    Constitutional: She is oriented to person, place, and time. She appears well-developed and well-nourished. No distress.  HENT:  Head: Normocephalic and atraumatic.  Right Ear: External ear normal.  Left Ear: External ear normal.  Mouth/Throat: Oropharynx is clear and moist. No oropharyngeal exudate.  Eyes: Conjunctivae and EOM are normal. Pupils are equal, round, and reactive to light. Right eye exhibits no discharge. Left eye exhibits no discharge. No scleral icterus.  Neck: Normal range of motion. Neck supple. No JVD present. No tracheal deviation present. No thyromegaly present.  Cardiovascular: Normal rate, regular rhythm, normal heart sounds and intact distal pulses.  Exam reveals no gallop and no friction rub.   No murmur heard. Pulmonary/Chest: Effort normal and breath sounds normal. No respiratory distress. She has no wheezes. She has no rales. She exhibits no tenderness.  Abdominal: Soft. Bowel sounds are normal. She exhibits no distension and no mass. There is no tenderness. There is no rebound and no guarding.  Musculoskeletal: Normal range of motion. She exhibits no edema or tenderness.  Lymphadenopathy:    She has no cervical adenopathy.  Neurological: She is alert and oriented to person, place, and time. She has normal reflexes. No cranial nerve deficit. She exhibits normal muscle tone. Coordination normal.  Skin: Skin is warm and dry. No rash noted. She is not diaphoretic. No erythema. No pallor.  Psychiatric: She has a normal mood and affect. Her behavior is normal. Judgment and thought content normal.  Vitals reviewed.   Filed Vitals:   12/23/14 1612  BP: 138/60  Pulse: 80  Height: 5\' 7"  (1.702 m)  Weight: 159 lb (72.122 kg)  SpO2: 97%         Assessment & Plan:     ICD-9-CM ICD-10-CM   1. Dyspnea and respiratory abnormality 786.09 R06.00     R06.89    The basis of her dyspnea is unclear. It is improved. At her request we will stop long-acting beta agonist.  We'll just treat her with inhaled steroidal. At follow-up I will do an exhaled nitric oxide and if this is normal I will try to discuss with her about stopping inhaled steroidal altogether. On the other hand if his symptoms are getting worse or if it persists then we will do pulmonary stress test   (> 50% of this 15 min visit spent in face to face counseling or/and coordination of care)   Dr. Kalman Shan, M.D., [facility].C.P Pulmonary and Critical Care Medicine Staff Physician [facility] Pager: [phone], If no answer or between  15:00h - 7:00h: call [phone]  12/23/2014 4:39 PM

## 2015-01-04 ENCOUNTER — Ambulatory Visit (INDEPENDENT_AMBULATORY_CARE_PROVIDER_SITE_OTHER): Payer: Medicare Other | Admitting: Interventional Cardiology

## 2015-01-04 ENCOUNTER — Encounter: Payer: Self-pay | Admitting: Interventional Cardiology

## 2015-01-04 VITALS — BP 130/62 | HR 78 | Ht 67.0 in | Wt 158.0 lb

## 2015-01-04 DIAGNOSIS — J4530 Mild persistent asthma, uncomplicated: Secondary | ICD-10-CM | POA: Diagnosis not present

## 2015-01-04 DIAGNOSIS — R1310 Dysphagia, unspecified: Secondary | ICD-10-CM | POA: Diagnosis not present

## 2015-01-04 DIAGNOSIS — R0789 Other chest pain: Secondary | ICD-10-CM

## 2015-01-04 NOTE — Progress Notes (Signed)
Patient ID: Anne [name], female   DOB: 10-Aug-1930, 81 y.o.   MRN: [id number]     Cardiology Office Note   Date:  01/04/2015   ID:  Anne [name], DOB Jan 10, 1931, MRN [id number]  PCP:  Ginette Otto, MD  Cardiologist:  Lesleigh Noe, MD   Chief Complaint  Patient presents with  . Chest Pain      History of Present Illness: Anne [name] is a 81 y.o. female who presents for  Chest discomfort.   For 3-4 months she has been having dysphagia and postprandial chest discomfort. She is seen Dr. Laural Benes , GI , and has had a negative barium swallow. She has a history of reflux. There is no exertional component to the discomfort. She has had a CT scan performed without abnormalities noted. She had a myocardial perfusion study performed in July that was low risk. There are no exertional complaints. She does not have a history of vascular disease.    Past Medical History  Diagnosis Date  . Hematuria   . Asthma   . Hyperlipidemia   . Allergic rhinitis   . Atypical chest pain   . Collapse of right lung   . Lung nodules   . Arthritis   . Glaucoma   . Gastroesophageal reflux disease   . Dyslipidemia     Past Surgical History  Procedure Laterality Date  . Nasal sinus surgery    . Total abdominal hysterectomy w/ bilateral salpingoophorectomy    . Mediastinoscopy  4/11  . Mass excision  04/11/2012    Procedure: EXCISION MASS;  Surgeon: Nicki Reaper, MD;  Location: [facility];  Service: Orthopedics;  Laterality: Right;  Excision of mass right upper arm  . Cataract extraction Bilateral   . Anal fissure repair       Current Outpatient Prescriptions  Medication Sig Dispense Refill  . albuterol (PROVENTIL HFA;VENTOLIN HFA) 108 (90 BASE) MCG/ACT inhaler Inhale 2 puffs into the lungs every 6 (six) hours as needed for wheezing.    . beclomethasone (QVAR) 80 MCG/ACT inhaler Inhale 2 puffs into the lungs 2 (two) times daily. 1 Inhaler 12  . Calcium  Carb-Cholecalciferol (CALCIUM 600 + D) 600-200 MG-UNIT TABS Take 1 tablet by mouth daily.    . cetirizine (ZYRTEC) 10 MG tablet Take 10 mg by mouth every morning.    . fluticasone (FLONASE) 50 MCG/ACT nasal spray Place 1 spray into both nostrils 4 (four) times daily as needed for allergies.     Marland Kitchen latanoprost (XALATAN) 0.005 % ophthalmic solution Place 1 drop into both eyes at bedtime.    . Multiple Vitamin (MULTIVITAMIN) tablet Take 1 tablet by mouth every morning.     . pantoprazole (PROTONIX) 20 MG tablet Take 20 mg by mouth daily.    . pravastatin (PRAVACHOL) 20 MG tablet Take 20 mg by mouth every evening.     . TURMERIC PO Take 1 capsule by mouth daily.     No current facility-administered medications for this visit.   Facility-Administered Medications Ordered in Other Visits  Medication Dose Route Frequency Provider Last Rate Last Dose  . regadenoson (LEXISCAN) injection SOLN 0.4 mg  0.4 mg Intravenous Once Quintella Reichert, MD        Allergies:   Shellfish allergy; Iodine; Latex; and Adhesive    Social History:  The patient  reports that she has never smoked. She has never used smokeless tobacco. She reports that she does not drink alcohol or use  illicit drugs.   Family History:  The patient's family history includes Cancer in her mother; Heart disease in her father.    ROS:  Please see the history of present illness.   Otherwise, review of systems are positive for  Dizziness, low back discomfort, discomfort in the chest when she swallows..   All other systems are reviewed and negative.    PHYSICAL EXAM: VS:  BP 130/62 mmHg  Pulse 78  Ht 5\' 7"  (1.702 m)  Wt 71.668 kg (158 lb)  BMI 24.74 kg/m2  SpO2 97% , BMI Body mass index is 24.74 kg/(m^2). GEN: Well nourished, well developed, in no acute distress HEENT: normal Neck: no JVD, carotid bruits, or masses Cardiac: RRR.  There is no murmur, rub, or gallop. There is no edema. Respiratory:  clear to auscultation bilaterally,  normal work of breathing. GI: soft, nontender, nondistended, + BS MS: no deformity or atrophy Skin: warm and dry, no rash Neuro:  Strength and sensation are intact Psych: euthymic mood, full affect   EKG:  EKG is ordered today   Recent Labs: 10/09/2014: B Natriuretic Peptide 69.6; BUN 13; Creatinine, Ser 0.87; Hemoglobin 11.5*; Platelets 307; Potassium 3.9; Sodium 138    Lipid Panel No results found for: CHOL, TRIG, HDL, CHOLHDL, VLDL, LDLCALC, LDLDIRECT    Wt Readings from Last 3 Encounters:  01/04/15 71.668 kg (158 lb)  12/23/14 72.122 kg (159 lb)  11/26/14 71.215 kg (157 lb)      Other studies Reviewed: Additional studies/ records that were reviewed today include:  Reviewed nuclear study. Review CT scan report.. The findings include  No new data identified.    ASSESSMENT AND PLAN:   1. Chest tightness Low risk myocardial perfusion study. Doubt that there is a cardiac source for the patient's chest discomfort  2. Asthma, mild persistent, uncomplicated Stable  3. Dysphagia likely related to esophageal reflux and the source of the patient's chest discomfort  Current medicines are reviewed at length with the patient today.  The patient has the following concerns regarding medicines:  none.  The following changes/actions have been instituted:     no specific cardiac evaluation needed   reassurance concerning the absence of clinical coronary disease.   Encouraged to return if she develops exertional symptoms.  Labs/ tests ordered today include:  No orders of the defined types were placed in this encounter.     Prolonged office visit lasting greater than 35 minutes with more than 50% of the time spent in face-to-face dialogue with the patient.  Disposition:   FU with HS in PRN     Signed, Lesleigh Noe, MD  01/04/2015 3:56 PM    [facility] [address] [place], [place], [place]  [address] Phone: [phone]; Fax: [phone]

## 2015-01-04 NOTE — Patient Instructions (Signed)
Medication Instructions:  Your physician recommends that you continue on your current medications as directed. Please refer to the Current Medication list given to you today.   Labwork: None ordered   Testing/Procedures: None ordered   Follow-Up: Your physician recommends that you schedule a follow-up appointment as needed    Any Other Special Instructions Will Be Listed Below (If Applicable).   

## 2015-03-07 ENCOUNTER — Ambulatory Visit (INDEPENDENT_AMBULATORY_CARE_PROVIDER_SITE_OTHER): Payer: Medicare Other | Admitting: Internal Medicine

## 2015-03-07 ENCOUNTER — Encounter: Payer: Self-pay | Admitting: Internal Medicine

## 2015-03-07 VITALS — BP 108/52 | HR 72 | Ht 67.0 in | Wt 160.0 lb

## 2015-03-07 DIAGNOSIS — R0689 Other abnormalities of breathing: Secondary | ICD-10-CM | POA: Diagnosis not present

## 2015-03-07 DIAGNOSIS — R0600 Dyspnea, unspecified: Secondary | ICD-10-CM | POA: Diagnosis not present

## 2015-03-07 NOTE — Patient Instructions (Signed)
ICD-9-CM ICD-10-CM   1. Dyspnea and respiratory abnormality 786.09 R06.00     R06.89     Glad you are better AGree we will hold off  CPST testing  Plan  - continue QVAR 2 puff twice daily with albuterol as needed  Followup  6 months or sooner   -0 FeNO test again at followup; we can reattempt  - if shortnesss of breath worse or unresolved, can discuss CPST testing

## 2015-03-07 NOTE — Progress Notes (Signed)
Subjective:    Patient ID: Anne [name], female    DOB: 1931-01-03, 81 y.o.   MRN: [id number]  HPI   OV 12/23/2014  Chief Complaint  Patient presents with  . Follow-up    Pt here after the CT chest and myocardial perfusion study. Pt states her breathing is doing well. Pt states when she swallows she feels there is a bloackage.  Pt denies SOB, cough, and CP/tightness.    Follow-up dyspnea  - At last visit I had her do CT scan of the chest which did not show any change compared to the chronic changes she had several years ago. She also underwent nuclear medicine cardiac stress test in interim. I did a review the results of these are normal. In the interim her dyspnea has significantly improved. She is feeling better. She is on ICS/LABA Symbicort for few months. Prior to that she was on Advair for a few years. She is worried about the side effects of long-acting beta agonists and wants to reduce down to inhaled steroidal. She is not worried about side effects of ICS. The basis of her dyspnea is unknown. Asthma diagnosis was never established. The treatment with inhalers are completely empiric. I tried to rationalize about stopping inhalers altogether but she's not willing to undergo the strategy. However she only wants to give up long-acting beta agonist due to cardiac side effects. She will have a flu shot with her primary care physician next week   reports that she has never smoked. She has never used smokeless tobacco.   OV 03/07/2015  Chief Complaint  Patient presents with  . Follow-up    Pt states her breathing is unchanged since last OV. Pt is now taking Qvar and stopped Symbicort, pt states she has not noticed a difference in her breathing. Pt states her SOB is at baseline. Pt denies cough and CP/tightness.     Dyspnea follow up. Associated asthma diagnosis of presumptive one  Currently she reports she is better. She told my medical assistant that her dyspnea is baseline but when  I spoke to her she said that dyspnea is better. It is not noticeable much. She does not want to do pulmonary stress testing. She is now taking only Qvar. She is tolerating this fine. Dyspnea did not get worse when she scaled down from Symbicort to Qvar. We tried to do exhaled nitric oxide testing on her today but she could not follow through. At this point she is content continuing with Qvar   Current outpatient prescriptions:  .  albuterol (PROVENTIL HFA;VENTOLIN HFA) 108 (90 BASE) MCG/ACT inhaler, Inhale 2 puffs into the lungs every 6 (six) hours as needed for wheezing., Disp: , Rfl:  .  beclomethasone (QVAR) 80 MCG/ACT inhaler, Inhale 2 puffs into the lungs 2 (two) times daily., Disp: 1 Inhaler, Rfl: 12 .  Calcium Carb-Cholecalciferol (CALCIUM 600 + D) 600-200 MG-UNIT TABS, Take 1 tablet by mouth daily., Disp: , Rfl:  .  cetirizine (ZYRTEC) 10 MG tablet, Take 10 mg by mouth every morning., Disp: , Rfl:  .  fluticasone (FLONASE) 50 MCG/ACT nasal spray, Place 1 spray into both nostrils 4 (four) times daily as needed for allergies. , Disp: , Rfl:  .  latanoprost (XALATAN) 0.005 % ophthalmic solution, Place 1 drop into both eyes at bedtime., Disp: , Rfl:  .  Multiple Vitamin (MULTIVITAMIN) tablet, Take 1 tablet by mouth every morning. , Disp: , Rfl:  .  pantoprazole (PROTONIX) 20 MG tablet, Take  20 mg by mouth daily., Disp: , Rfl:  .  pravastatin (PRAVACHOL) 20 MG tablet, Take 20 mg by mouth every evening. , Disp: , Rfl:  .  TURMERIC PO, Take 1 capsule by mouth daily., Disp: , Rfl:  No current facility-administered medications for this visit.  Facility-Administered Medications Ordered in Other Visits:  .  regadenoson (LEXISCAN) injection SOLN 0.4 mg, 0.4 mg, Intravenous, Once, Quintella Reichert, MD   Review of Systems     Objective:   Physical Exam  Constitutional: She is oriented to person, place, and time. She appears well-developed and well-nourished. No distress.  HENT:  Head:  Normocephalic and atraumatic.  Right Ear: External ear normal.  Left Ear: External ear normal.  Mouth/Throat: Oropharynx is clear and moist. No oropharyngeal exudate.  Eyes: Conjunctivae and EOM are normal. Pupils are equal, round, and reactive to light. Right eye exhibits no discharge. Left eye exhibits no discharge. No scleral icterus.  Neck: Normal range of motion. Neck supple. No JVD present. No tracheal deviation present. No thyromegaly present.  Cardiovascular: Normal rate, regular rhythm, normal heart sounds and intact distal pulses.  Exam reveals no gallop and no friction rub.   No murmur heard. Pulmonary/Chest: Effort normal and breath sounds normal. No respiratory distress. She has no wheezes. She has no rales. She exhibits no tenderness.  Abdominal: Soft. Bowel sounds are normal. She exhibits no distension and no mass. There is no tenderness. There is no rebound and no guarding.  Musculoskeletal: Normal range of motion. She exhibits no edema or tenderness.  Lymphadenopathy:    She has no cervical adenopathy.  Neurological: She is alert and oriented to person, place, and time. She has normal reflexes. No cranial nerve deficit. She exhibits normal muscle tone. Coordination normal.  Skin: Skin is warm and dry. No rash noted. She is not diaphoretic. No erythema. No pallor.  Psychiatric: She has a normal mood and affect. Her behavior is normal. Judgment and thought content normal.  Vitals reviewed.  Filed Vitals:   03/07/15 1352  BP: 108/52  Pulse: 72  Height: 5\' 7"  (1.702 m)  Weight: 160 lb (72.576 kg)  SpO2: 95%          Assessment & Plan:     ICD-9-CM ICD-10-CM   1. Dyspnea and respiratory abnormality 786.09 R06.00     R06.89     Glad you are better AGree we will hold off  CPST testing  Plan  - continue QVAR 2 puff twice daily with albuterol as needed  Followup  6 months or sooner   -0 FeNO test again at followup; we can reattempt  - if shortnesss of breath  worse or unresolved, can discuss CPST testing     Dr. Kalman Shan, M.D., [facility].C.P Pulmonary and Critical Care Medicine Staff Physician [facility] Pulmonary and Critical Care Pager: [phone], If no answer or between  15:00h - 7:00h: call [phone]  03/13/2015 11:59 PM

## 2015-03-14 ENCOUNTER — Telehealth: Payer: Self-pay | Admitting: Internal Medicine

## 2015-03-14 NOTE — Telephone Encounter (Signed)
Spoke with pt, states that her AVS indicated that they would discuss a CPST if she did not improve.  Pt states that her SOB is still present, worse with inclines.   Pt states that she had a pft in July and a cardiac stress test in August.  Pt wants to know if she's had these tests so recently why she would need the cpst.  I explained that this is a different test than the two she had, but pt still wants to know why this is recommended for her, what exactly the cpst includes, and if MR thinks she should switch back to Advair or Symbicort since that seemed to work better than her Qvar.   Pt states she will be in and out tomorrow, ok to leave detailed message on answering machine.    MR please advise.  Thanks!

## 2015-03-15 NOTE — Telephone Encounter (Signed)
I have explaiend to her many times about CPST and she keeps gettng confused and forgetful. She told me dyspnea was better and if it got worse at followup then she will do cpst. Tell her that CPST is pulmonary stress test to idnetify cause sof dyspnea when other tests cannot explain. Please remind her that I have spoken to her about this and she opted to keep an eye. Will discuss again at fu If she wants to discuss sooner let me know. If her dyspnea is getting worse then she would need CPST  Regarding MDI - she told me qvar was working just fine and she preferred single agent so she can avoid side effects of symbicort and advair. So hold off change  Also - pls have Daneil Dan address this with patient - Daneil Dan knows her better   Thanks  Dr. Brand Males, M.D., [facility].C.P Pulmonary and Critical Care Medicine Staff Physician [place] [facility]monary and Critical Care Pager: [phone], If no answer or between  15:00h - 7:00h: call [phone]  03/15/2015 1:23 AM

## 2015-03-16 NOTE — Telephone Encounter (Signed)
Called spoke with pt. She reports she was only told once about this test and was fully in detail explained what this test invocles. Pt would not let me finish explaining MR recs and kept interrupting stating this is not what she had asked about. As stated below MR would like Daneil Dan to call and speak with pt. Please advise Daneil Dan thanks

## 2015-03-17 ENCOUNTER — Emergency Department (HOSPITAL_COMMUNITY): Payer: Medicare Other

## 2015-03-17 ENCOUNTER — Telehealth: Payer: Self-pay | Admitting: Internal Medicine

## 2015-03-17 ENCOUNTER — Encounter (HOSPITAL_COMMUNITY): Payer: Self-pay

## 2015-03-17 ENCOUNTER — Emergency Department (HOSPITAL_COMMUNITY)
Admission: EM | Admit: 2015-03-17 | Discharge: 2015-03-17 | Disposition: A | Discharge status: Home / Self Care | Payer: Medicare Other | Attending: Emergency Medicine | Admitting: Emergency Medicine

## 2015-03-17 DIAGNOSIS — Z91040 Latex allergy status: Secondary | ICD-10-CM | POA: Diagnosis not present

## 2015-03-17 DIAGNOSIS — J441 Chronic obstructive pulmonary disease with (acute) exacerbation: Secondary | ICD-10-CM | POA: Diagnosis not present

## 2015-03-17 DIAGNOSIS — R0602 Shortness of breath: Secondary | ICD-10-CM | POA: Diagnosis present

## 2015-03-17 DIAGNOSIS — E785 Hyperlipidemia, unspecified: Secondary | ICD-10-CM | POA: Insufficient documentation

## 2015-03-17 DIAGNOSIS — Z7951 Long term (current) use of inhaled steroids: Secondary | ICD-10-CM | POA: Diagnosis not present

## 2015-03-17 DIAGNOSIS — K219 Gastro-esophageal reflux disease without esophagitis: Secondary | ICD-10-CM | POA: Diagnosis not present

## 2015-03-17 DIAGNOSIS — R0789 Other chest pain: Secondary | ICD-10-CM

## 2015-03-17 DIAGNOSIS — Z8739 Personal history of other diseases of the musculoskeletal system and connective tissue: Secondary | ICD-10-CM | POA: Insufficient documentation

## 2015-03-17 DIAGNOSIS — H409 Unspecified glaucoma: Secondary | ICD-10-CM | POA: Insufficient documentation

## 2015-03-17 DIAGNOSIS — Z79899 Other long term (current) drug therapy: Secondary | ICD-10-CM | POA: Diagnosis not present

## 2015-03-17 LAB — BASIC METABOLIC PANEL
Anion gap: 8 (ref 5–15)
BUN: 15 mg/dL (ref 6–20)
CALCIUM: 9 mg/dL (ref 8.9–10.3)
CHLORIDE: 105 mmol/L (ref 101–111)
CO2: 27 mmol/L (ref 22–32)
CREATININE: 0.98 mg/dL (ref 0.44–1.00)
GFR calc non Af Amer: 52 mL/min — ABNORMAL LOW (ref >60)
Glucose, Bld: 121 mg/dL — ABNORMAL HIGH (ref 65–99)
Potassium: 3.9 mmol/L (ref 3.5–5.1)
SODIUM: 140 mmol/L (ref 135–145)

## 2015-03-17 LAB — CBC
HCT: 35.3 % — ABNORMAL LOW (ref 36.0–46.0)
Hemoglobin: 11.5 g/dL — ABNORMAL LOW (ref 12.0–15.0)
MCH: 30.3 pg (ref 26.0–34.0)
MCHC: 32.6 g/dL (ref 30.0–36.0)
MCV: 92.9 fL (ref 78.0–100.0)
PLATELETS: 283 10*3/uL (ref 150–400)
RBC: 3.8 MIL/uL — AB (ref 3.87–5.11)
RDW: 13.5 % (ref 11.5–15.5)
WBC: 8.3 10*3/uL (ref 4.0–10.5)

## 2015-03-17 LAB — I-STAT TROPONIN, ED
TROPONIN I, POC: 0 ng/mL (ref 0.00–0.08)
Troponin i, poc: 0 ng/mL (ref 0.00–0.08)

## 2015-03-17 LAB — BRAIN NATRIURETIC PEPTIDE: B Natriuretic Peptide: 103.9 pg/mL — ABNORMAL HIGH (ref 0.0–100.0)

## 2015-03-17 MED ORDER — ALBUTEROL SULFATE (2.5 MG/3ML) 0.083% IN NEBU
5.0000 mg | INHALATION_SOLUTION | Freq: Once | RESPIRATORY_TRACT | Status: AC
  Administered 2015-03-17: 5 mg via RESPIRATORY_TRACT
  Filled 2015-03-17: qty 6

## 2015-03-17 MED ORDER — PREDNISONE 20 MG PO TABS: 60.0000 mg | ORAL_TABLET | Freq: Every day | ORAL | Status: DC

## 2015-03-17 MED ORDER — METHYLPREDNISOLONE SODIUM SUCC 125 MG IJ SOLR
125.0000 mg | Freq: Once | INTRAMUSCULAR | Status: AC
  Administered 2015-03-17: 125 mg via INTRAMUSCULAR
  Filled 2015-03-17: qty 2

## 2015-03-17 NOTE — ED Notes (Signed)
Patient d/c'd self care.  F/U and medications reviewed, patient verbalized understanding.

## 2015-03-17 NOTE — Telephone Encounter (Signed)
Spoke with pt. States that she has caught a cold and it's triggering her asthma. Reports SOB, chest tightness and coughing with producing white mucus. Denies wheezing. Also feels like Qvar is not working well. Would like recommendations.  Allergies  Allergen Reactions  . Shellfish Allergy Shortness Of Breath  . Iodine Other (See Comments)    REACTION: " UNSURE TOLD TO LIST AS ALLERGEN BY MD"  . Latex Itching  . Adhesive [Tape] Rash   Current Outpatient Prescriptions on File Prior to Visit  Medication Sig Dispense Refill  . albuterol (PROVENTIL HFA;VENTOLIN HFA) 108 (90 BASE) MCG/ACT inhaler Inhale 2 puffs into the lungs every 6 (six) hours as needed for wheezing.    . beclomethasone (QVAR) 80 MCG/ACT inhaler Inhale 2 puffs into the lungs 2 (two) times daily. 1 Inhaler 12  . Calcium Carb-Cholecalciferol (CALCIUM 600 + D) 600-200 MG-UNIT TABS Take 1 tablet by mouth daily.    . cetirizine (ZYRTEC) 10 MG tablet Take 10 mg by mouth every morning.    . fluticasone (FLONASE) 50 MCG/ACT nasal spray Place 1 spray into both nostrils 4 (four) times daily as needed for allergies.     Marland Kitchen latanoprost (XALATAN) 0.005 % ophthalmic solution Place 1 drop into both eyes at bedtime.    . Multiple Vitamin (MULTIVITAMIN) tablet Take 1 tablet by mouth every morning.     . pantoprazole (PROTONIX) 20 MG tablet Take 20 mg by mouth daily.    . pravastatin (PRAVACHOL) 20 MG tablet Take 20 mg by mouth every evening.     . TURMERIC PO Take 1 capsule by mouth daily.     Current Facility-Administered Medications on File Prior to Visit  Medication Dose Route Frequency Provider Last Rate Last Dose  . regadenoson (LEXISCAN) injection SOLN 0.4 mg  0.4 mg Intravenous Once Sueanne Margarita, MD         CY - please advise as MR worked night shift last night. Thanks.

## 2015-03-17 NOTE — ED Provider Notes (Signed)
CSN: [id number]     Arrival date & time 03/17/15  1051 History   First MD Initiated Contact with Patient 03/17/15 1106     Chief Complaint  Patient presents with  . Chest Pain  . Shortness of Breath     (Consider location/radiation/quality/duration/timing/severity/associated sxs/prior Treatment) HPI Patient presents with 3 days of cough, nasal congestion and scratchy throat. She developed shortness of breath and chest tightness 2 days ago. This is been constant since development. She's been using her albuterol inhaler with little relief. She denies any definite wheezing. No new lower extremity swelling or pain. Denies any fever or chills. No known sick contacts. Patient states she had a negative stress test the summer. Past Medical History  Diagnosis Date  . Hematuria   . Asthma   . Hyperlipidemia   . Allergic rhinitis   . Atypical chest pain   . Collapse of right lung   . Lung nodules   . Arthritis   . Glaucoma   . Gastroesophageal reflux disease   . Dyslipidemia    Past Surgical History  Procedure Laterality Date  . Nasal sinus surgery    . Total abdominal hysterectomy w/ bilateral salpingoophorectomy    . Mediastinoscopy  4/11  . Mass excision  04/11/2012    Procedure: EXCISION MASS;  Surgeon: Wynonia Sours, MD;  Location: [place];  Service: Orthopedics;  Laterality: Right;  Excision of mass right upper arm  . Cataract extraction Bilateral   . Anal fissure repair     Family History  Problem Relation Age of Onset  . Cancer Mother   . Heart disease Father    Social History  Substance Use Topics  . Smoking status: Never Smoker   . Smokeless tobacco: Never Used  . Alcohol Use: No   OB History    No data available     Review of Systems  Constitutional: Negative for fever and chills.  HENT: Positive for congestion and sore throat. Negative for sinus pressure.   Respiratory: Positive for cough, chest tightness and shortness of breath. Negative for  wheezing.   Cardiovascular: Negative for chest pain, palpitations and leg swelling.  Gastrointestinal: Negative for nausea, vomiting and abdominal pain.  Musculoskeletal: Negative for back pain, neck pain and neck stiffness.  Skin: Negative for rash and wound.  Neurological: Negative for dizziness, weakness, light-headedness, numbness and headaches.  All other systems reviewed and are negative.     Allergies  Shellfish allergy; Iodine; Latex; and Adhesive  Home Medications   Prior to Admission medications   Medication Sig Start Date End Date Taking? Authorizing Provider  albuterol (PROVENTIL HFA;VENTOLIN HFA) 108 (90 BASE) MCG/ACT inhaler Inhale 2 puffs into the lungs every 6 (six) hours as needed for wheezing.   Yes Historical Provider, MD  Calcium Carb-Cholecalciferol (CALCIUM 600 + D) 600-200 MG-UNIT TABS Take 1 tablet by mouth daily.   Yes Historical Provider, MD  cetirizine (ZYRTEC) 10 MG tablet Take 10 mg by mouth every morning.   Yes Historical Provider, MD  fluticasone (FLONASE) 50 MCG/ACT nasal spray Place 1 spray into both nostrils 4 (four) times daily as needed for allergies.    Yes Historical Provider, MD  latanoprost (XALATAN) 0.005 % ophthalmic solution Place 1 drop into both eyes at bedtime.   Yes Historical Provider, MD  Multiple Vitamin (MULTIVITAMIN) tablet Take 1 tablet by mouth every morning.    Yes Historical Provider, MD  pantoprazole (PROTONIX) 40 MG tablet Take 40 mg by mouth daily. 03/14/15  Yes Historical Provider, MD  pravastatin (PRAVACHOL) 20 MG tablet Take 20 mg by mouth every evening.  08/27/14  Yes Historical Provider, MD  TURMERIC PO Take 1 capsule by mouth daily.   Yes Historical Provider, MD  beclomethasone (QVAR) 80 MCG/ACT inhaler Inhale 2 puffs into the lungs 2 (two) times daily. Patient not taking: Reported on 03/17/2015 12/23/14   Brand Males, MD  predniSONE (DELTASONE) 20 MG tablet Take 3 tablets (60 mg total) by mouth daily. 03/17/15   Julianne Rice, MD   BP 154/60 mmHg  Pulse 76  Temp(Src) 98.3 F (36.8 C) (Oral)  Resp 16  SpO2 100% Physical Exam  Constitutional: She is oriented to person, place, and time. She appears well-developed and well-nourished. No distress.  HENT:  Head: Normocephalic and atraumatic.  Mouth/Throat: Oropharynx is clear and moist. No oropharyngeal exudate.  Bilateral nasal mucosal edema. No sinus tenderness with percussion. Uvula is midline. No tonsillar hypertrophy or exudates.  Eyes: EOM are normal. Pupils are equal, round, and reactive to light.  Neck: Normal range of motion. Neck supple. No JVD present.  Cardiovascular: Normal rate and regular rhythm.  Exam reveals no gallop and no friction rub.   No murmur heard. Pulmonary/Chest: Effort normal. No stridor. No respiratory distress. She has no wheezes. She has rales.  No wheezing appreciated. Decreased air movement with scattered rhonchi bilateral bases  Abdominal: Soft. Bowel sounds are normal. She exhibits no distension and no mass. There is no tenderness. There is no rebound and no guarding.  Musculoskeletal: Normal range of motion. She exhibits no edema or tenderness.  No lower extremity swelling or pain. Distal pulses intact.  Neurological: She is alert and oriented to person, place, and time.  Moves all extremities without deficit. Sensation is fully intact.  Skin: Skin is warm and dry. No rash noted. No erythema.  Psychiatric: She has a normal mood and affect. Her behavior is normal.  Nursing note and vitals reviewed.   ED Course  Procedures (including critical care time) Labs Review Labs Reviewed  BASIC METABOLIC PANEL - Abnormal; Notable for the following:    Glucose, Bld 121 (*)    GFR calc non Af Amer 52 (*)    All other components within normal limits  CBC - Abnormal; Notable for the following:    RBC 3.80 (*)    Hemoglobin 11.5 (*)    HCT 35.3 (*)    All other components within normal limits  BRAIN NATRIURETIC PEPTIDE -  Abnormal; Notable for the following:    B Natriuretic Peptide 103.9 (*)    All other components within normal limits  I-STAT TROPOININ, ED  Randolm Idol, ED    Imaging Review Dg Chest 2 View  03/17/2015  CLINICAL DATA:  Shortness of breath and chest tightness for 3 days EXAM: CHEST - 2 VIEW COMPARISON:  10/09/2014 FINDINGS: The heart size and mediastinal contours are within normal limits. Both lungs are clear but hyperinflated. The visualized skeletal structures are unremarkable. IMPRESSION: COPD without acute abnormality Electronically Signed   By: Inez Catalina M.D.   On: 03/17/2015 11:53   I have personally reviewed and evaluated these images and lab results as part of my medical decision-making.   EKG Interpretation   Date/Time:  Thursday March 17 2015 10:58:39 EST Ventricular Rate:  83 PR Interval:  171 QRS Duration: 76 QT Interval:  351 QTC Calculation: 412 R Axis:   76 Text Interpretation:  Sinus rhythm Probable left atrial enlargement RSR'  in V1  or V2, probably normal variant Nonspecific T abnrm, anterolateral  leads Confirmed by Lita Mains  MD, Welby Montminy ([id number]) on 03/17/2015 11:22:13 AM      MDM   Final diagnoses:  COPD exacerbation ([place])  Atypical chest pain   Patient's much better after breathing treatment. EKG without evidence of ischemia. Troponin 2 is normal. Chest x-ray without any definite infiltrate. Chest tightness likely due to asthma exacerbation. She's advised to follow-up with cardiology and her pulmonologist. Return precautions have been given.     Julianne Rice, MD 03/17/15 [phone]

## 2015-03-17 NOTE — ED Notes (Signed)
Patient completed nebulizer treatment

## 2015-03-17 NOTE — Telephone Encounter (Signed)
For now suggest prednisone 10 mg, # 20, 4 X 2 DAYS, 3 X 2 DAYS, 2 X 2 DAYS, 1 X 2 DAYS   Call next week to discuss status with Dr Chase Caller

## 2015-03-17 NOTE — Discharge Instructions (Signed)
Chronic Obstructive Pulmonary Disease Chronic obstructive pulmonary disease (COPD) is a common lung condition in which airflow from the lungs is limited. COPD is a general term that can be used to describe many different lung problems that limit airflow, including both chronic bronchitis and emphysema. If you have COPD, your lung function will probably never return to normal, but there are measures you can take to improve lung function and make yourself feel better. CAUSES   Smoking (common).  Exposure to secondhand smoke.  Genetic problems.  Chronic inflammatory lung diseases or recurrent infections. SYMPTOMS  Shortness of breath, especially with physical activity.  Deep, persistent (chronic) cough with a large amount of thick mucus.  Wheezing.  Rapid breaths (tachypnea).  Gray or bluish discoloration (cyanosis) of the skin, especially in your fingers, toes, or lips.  Fatigue.  Weight loss.  Frequent infections or episodes when breathing symptoms become much worse (exacerbations).  Chest tightness. DIAGNOSIS Your health care provider will take a medical history and perform a physical examination to diagnose COPD. Additional tests for COPD may include:  Lung (pulmonary) function tests.  Chest X-ray.  CT scan.  Blood tests. TREATMENT  Treatment for COPD may include:  Inhaler and nebulizer medicines. These help manage the symptoms of COPD and make your breathing more comfortable.  Supplemental oxygen. Supplemental oxygen is only helpful if you have a low oxygen level in your blood.  Exercise and physical activity. These are beneficial for nearly all people with COPD.  Lung surgery or transplant.  Nutrition therapy to gain weight, if you are underweight.  Pulmonary rehabilitation. This may involve working with a team of health care providers and specialists, such as respiratory, occupational, and physical therapists. HOME CARE INSTRUCTIONS  Take all medicines  (inhaled or pills) as directed by your health care provider.  Avoid over-the-counter medicines or cough syrups that dry up your airway (such as antihistamines) and slow down the elimination of secretions unless instructed otherwise by your health care provider.  If you are a smoker, the most important thing that you can do is stop smoking. Continuing to smoke will cause further lung damage and breathing trouble. Ask your health care provider for help with quitting smoking. He or she can direct you to community resources or hospitals that provide support.  Avoid exposure to irritants such as smoke, chemicals, and fumes that aggravate your breathing.  Use oxygen therapy and pulmonary rehabilitation if directed by your health care provider. If you require home oxygen therapy, ask your health care provider whether you should purchase a pulse oximeter to measure your oxygen level at home.  Avoid contact with individuals who have a contagious illness.  Avoid extreme temperature and humidity changes.  Eat healthy foods. Eating smaller, more frequent meals and resting before meals may help you maintain your strength.  Stay active, but balance activity with periods of rest. Exercise and physical activity will help you maintain your ability to do things you want to do.  Preventing infection and hospitalization is very important when you have COPD. Make sure to receive all the vaccines your health care provider recommends, especially the pneumococcal and influenza vaccines. Ask your health care provider whether you need a pneumonia vaccine.  Learn and use relaxation techniques to manage stress.  Learn and use controlled breathing techniques as directed by your health care provider. Controlled breathing techniques include:  Pursed lip breathing. Start by breathing in (inhaling) through your nose for 1 second. Then, purse your lips as if you were  going to whistle and breathe out (exhale) through the  pursed lips for 2 seconds.  Diaphragmatic breathing. Start by putting one hand on your abdomen just above your waist. Inhale slowly through your nose. The hand on your abdomen should move out. Then purse your lips and exhale slowly. You should be able to feel the hand on your abdomen moving in as you exhale.  Learn and use controlled coughing to clear mucus from your lungs. Controlled coughing is a series of short, progressive coughs. The steps of controlled coughing are:  Lean your head slightly forward.  Breathe in deeply using diaphragmatic breathing.  Try to hold your breath for 3 seconds.  Keep your mouth slightly open while coughing twice.  Spit any mucus out into a tissue.  Rest and repeat the steps once or twice as needed. SEEK MEDICAL CARE IF:  You are coughing up more mucus than usual.  There is a change in the color or thickness of your mucus.  Your breathing is more labored than usual.  Your breathing is faster than usual. SEEK IMMEDIATE MEDICAL CARE IF:  You have shortness of breath while you are resting.  You have shortness of breath that prevents you from:  Being able to talk.  Performing your usual physical activities.  You have chest pain lasting longer than 5 minutes.  Your skin color is more cyanotic than usual.  You measure low oxygen saturations for longer than 5 minutes with a pulse oximeter. MAKE SURE YOU:  Understand these instructions.  Will watch your condition.  Will get help right away if you are not doing well or get worse.   This information is not intended to replace advice given to you by your health care provider. Make sure you discuss any questions you have with your health care provider.   Document Released: 12/27/2004 Document Revised: 04/09/2014 Document Reviewed: 11/13/2012 Elsevier Interactive Patient Education 2016 Else[employer] Inc[employer]  Nonspecific Chest Pain  Chest pain can be caused by many different conditions. There is  always a chance that your pain could be related to something serious, such as a heart attack or a blood clot in your lungs. Chest pain can also be caused by conditions that are not life-threatening. If you have chest pain, it is very important to follow up with your health care provider. CAUSES  Chest pain can be caused by:  Heartburn.  Pneumonia or bronchitis.  Anxiety or stress.  Inflammation around your heart (pericarditis) or lung (pleuritis or pleurisy).  A blood clot in your lung.  A collapsed lung (pneumothorax). It can develop suddenly on its own (spontaneous pneumothorax) or from trauma to the chest.  Shingles infection (varicella-zoster virus).  Heart attack.  Damage to the bones, muscles, and cartilage that make up your chest wall. This can include:  Bruised bones due to injury.  Strained muscles or cartilage due to frequent or repeated coughing or overwork.  Fracture to one or more ribs.  Sore cartilage due to inflammation (costochondritis). RISK FACTORS  Risk factors for chest pain may include:  Activities that increase your risk for trauma or injury to your chest.  Respiratory infections or conditions that cause frequent coughing.  Medical conditions or overeating that can cause heartburn.  Heart disease or family history of heart disease.  Conditions or health behaviors that increase your risk of developing a blood clot.  Having had chicken pox (varicella zoster). SIGNS AND SYMPTOMS Chest pain can feel like:  Burning or tingling on the surface  of your chest or deep in your chest.  Crushing, pressure, aching, or squeezing pain.  Dull or sharp pain that is worse when you move, cough, or take a deep breath.  Pain that is also felt in your back, neck, shoulder, or arm, or pain that spreads to any of these areas. Your chest pain may come and go, or it may stay constant. DIAGNOSIS Lab tests or other studies may be needed to find the cause of your pain.  Your health care provider may have you take a test called an ambulatory ECG (electrocardiogram). An ECG records your heartbeat patterns at the time the test is performed. You may also have other tests, such as:  Transthoracic echocardiogram (TTE). During echocardiography, sound waves are used to create a picture of all of the heart structures and to look at how blood flows through your heart.  Transesophageal echocardiogram (TEE).This is a more advanced imaging test that obtains images from inside your body. It allows your health care provider to see your heart in finer detail.  Cardiac monitoring. This allows your health care provider to monitor your heart rate and rhythm in real time.  Holter monitor. This is a portable device that records your heartbeat and can help to diagnose abnormal heartbeats. It allows your health care provider to track your heart activity for several days, if needed.  Stress tests. These can be done through exercise or by taking medicine that makes your heart beat more quickly.  Blood tests.  Imaging tests. TREATMENT  Your treatment depends on what is causing your chest pain. Treatment may include:  Medicines. These may include:  Acid blockers for heartburn.  Anti-inflammatory medicine.  Pain medicine for inflammatory conditions.  Antibiotic medicine, if an infection is present.  Medicines to dissolve blood clots.  Medicines to treat coronary artery disease.  Supportive care for conditions that do not require medicines. This may include:  Resting.  Applying heat or cold packs to injured areas.  Limiting activities until pain decreases. HOME CARE INSTRUCTIONS  If you were prescribed an antibiotic medicine, finish it all even if you start to feel better.  Avoid any activities that bring on chest pain.  Do not use any tobacco products, including cigarettes, chewing tobacco, or electronic cigarettes. If you need help quitting, ask your health care  provider.  Do not drink alcohol.  Take medicines only as directed by your health care provider.  Keep all follow-up visits as directed by your health care provider. This is important. This includes any further testing if your chest pain does not go away.  If heartburn is the cause for your chest pain, you may be told to keep your head raised (elevated) while sleeping. This reduces the chance that acid will go from your stomach into your esophagus.  Make lifestyle changes as directed by your health care provider. These may include:  Getting regular exercise. Ask your health care provider to suggest some activities that are safe for you.  Eating a heart-healthy diet. A registered dietitian can help you to learn healthy eating options.  Maintaining a healthy weight.  Managing diabetes, if necessary.  Reducing stress. SEEK MEDICAL CARE IF:  Your chest pain does not go away after treatment.  You have a rash with blisters on your chest.  You have a fever. SEEK IMMEDIATE MEDICAL CARE IF:   Your chest pain is worse.  You have an increasing cough, or you cough up blood.  You have severe abdominal pain.  You  have severe weakness.  You faint.  You have chills.  You have sudden, unexplained chest discomfort.  You have sudden, unexplained discomfort in your arms, back, neck, or jaw.  You have shortness of breath at any time.  You suddenly start to sweat, or your skin gets clammy.  You feel nauseous or you vomit.  You suddenly feel light-headed or dizzy.  Your heart begins to beat quickly, or it feels like it is skipping beats. These symptoms may represent a serious problem that is an emergency. Do not wait to see if the symptoms will go away. Get medical help right away. Call your local emergency services (911 in the [place] Do not drive yourself to the hospital.   This information is not intended to replace advice given to you by your health care provider. Make sure you  discuss any questions you have with your health care provider.   Document Released: 12/27/2004 Document Revised: 04/09/2014 Document Reviewed: 10/23/2013 Elsevier Interactive Patient Education [employer].

## 2015-03-17 NOTE — Telephone Encounter (Signed)
lmtcb for pt.  

## 2015-03-17 NOTE — ED Notes (Signed)
Patient ambulatory to restroom  ?

## 2015-03-17 NOTE — ED Notes (Signed)
Pt here with shortness of breath and chest tightness since Tuesday.  Asthmatic with cold-like symptoms.  Slight cough.  No fever.  Non radiating.

## 2015-03-17 NOTE — Telephone Encounter (Signed)
Spoke with the pt  She states she is currently being treated at the ER  She will call next wk with update

## 2015-03-17 NOTE — ED Notes (Signed)
Pt has been informed that her "alarm is going off" and is anxious to leave.  Will notify MD.

## 2015-03-18 NOTE — Telephone Encounter (Signed)
Patient Returned call  [phone]  [phone]

## 2015-03-18 NOTE — Telephone Encounter (Signed)
As stated below will forward to [facility]

## 2015-03-22 NOTE — Telephone Encounter (Signed)
lmtcb for pt.  

## 2015-03-22 NOTE — Telephone Encounter (Signed)
Have you spoken with this pt? Thanks.

## 2015-03-23 NOTE — Telephone Encounter (Signed)
Received called and spoke to pt. Infomred her of the recs per MR. Pt verbalized understanding and stated she would give the CPST some thought and if she wanted to move forward with the CPST she would call after the holidays. Nothing further needed at this time.

## 2015-05-16 ENCOUNTER — Telehealth: Payer: Self-pay | Admitting: Internal Medicine

## 2015-05-16 NOTE — Telephone Encounter (Signed)
lmtcb x1 for pt. 

## 2015-05-17 NOTE — Telephone Encounter (Signed)
LM x 2

## 2015-05-18 NOTE — Telephone Encounter (Signed)
Per MR >> ok to switch back to Advair 100.  lmtcb x1 for pt.

## 2015-05-18 NOTE — Telephone Encounter (Signed)
Spoke with pt. She is needing a refill on Advair 100. States that she does not like Qvar and wants to switch back. Pt wants this done today!  MR - please advise. Thanks.

## 2015-05-18 NOTE — Telephone Encounter (Signed)
Ok  To change to advair

## 2015-05-18 NOTE — Telephone Encounter (Signed)
lmtcb x3 for pt. 

## 2015-05-18 NOTE — Telephone Encounter (Signed)
Pt cb [phone]

## 2015-05-19 MED ORDER — FLUTICASONE-SALMETEROL 100-50 MCG/DOSE IN AEPB: 1.0000 | INHALATION_SPRAY | Freq: Two times a day (BID) | RESPIRATORY_TRACT | Status: DC

## 2015-05-19 NOTE — Telephone Encounter (Signed)
Called and spoke with the patient. I informed her of MR's recs. Verified pharmacy as [facility] [address]. Pt voiced understanding and had no further questions. Rx was sent. Nothing further needed at this time.

## 2015-05-19 NOTE — Telephone Encounter (Signed)
Patient returned call, CB is [phone]

## 2015-07-18 ENCOUNTER — Other Ambulatory Visit: Payer: Self-pay

## 2015-07-18 DIAGNOSIS — Z1231 Encounter for screening mammogram for malignant neoplasm of breast: Secondary | ICD-10-CM

## 2015-08-23 ENCOUNTER — Ambulatory Visit: Payer: Medicare Other

## 2015-09-05 ENCOUNTER — Ambulatory Visit: Payer: Medicare Other | Admitting: Internal Medicine

## 2015-09-06 ENCOUNTER — Ambulatory Visit
Admission: RE | Admit: 2015-09-06 | Discharge: 2015-09-06 | Disposition: A | Discharge status: Home / Self Care | Payer: Medicare Other | Source: Ambulatory Visit

## 2015-09-06 ENCOUNTER — Other Ambulatory Visit: Payer: Self-pay | Admitting: Geriatric Medicine

## 2015-09-06 DIAGNOSIS — Z1231 Encounter for screening mammogram for malignant neoplasm of breast: Secondary | ICD-10-CM

## 2015-10-18 ENCOUNTER — Encounter: Payer: Self-pay | Admitting: Internal Medicine

## 2015-10-18 ENCOUNTER — Ambulatory Visit (INDEPENDENT_AMBULATORY_CARE_PROVIDER_SITE_OTHER): Payer: Medicare Other | Admitting: Internal Medicine

## 2015-10-18 VITALS — BP 134/64 | HR 72 | Ht 67.0 in | Wt 164.0 lb

## 2015-10-18 DIAGNOSIS — R0600 Dyspnea, unspecified: Secondary | ICD-10-CM | POA: Diagnosis not present

## 2015-10-18 DIAGNOSIS — R0689 Other abnormalities of breathing: Secondary | ICD-10-CM | POA: Diagnosis not present

## 2015-10-18 NOTE — Progress Notes (Signed)
Subjective:     Patient ID: Anne [name], female   DOB: [birth year], 82 y.o.   MRN: [id number]  HPI    OV 12/23/2014  Chief Complaint  Patient presents with  . Follow-up    Pt here after the CT chest and myocardial perfusion study. Pt states her breathing is doing well. Pt states when she swallows she feels there is a bloackage.  Pt denies SOB, cough, and CP/tightness.    Follow-up dyspnea  - At last visit I had her do CT scan of the chest which did not show any change compared to the chronic changes she had several years ago. She also underwent nuclear medicine cardiac stress test in interim. I did a review the results of these are normal. In the interim her dyspnea has significantly improved. She is feeling better. She is on ICS/LABA Symbicort for few months. Prior to that she was on Advair for a few years. She is worried about the side effects of long-acting beta agonists and wants to reduce down to inhaled steroidal. She is not worried about side effects of ICS. The basis of her dyspnea is unknown. Asthma diagnosis was never established. The treatment with inhalers are completely empiric. I tried to rationalize about stopping inhalers altogether but she's not willing to undergo the strategy. However she only wants to give up long-acting beta agonist due to cardiac side effects. She will have a flu shot with her primary care physician next week   reports that she has never smoked. She has never used smokeless tobacco.   OV 03/07/2015  Chief Complaint  Patient presents with  . Follow-up    Pt states her breathing is unchanged since last OV. Pt is now taking Qvar and stopped Symbicort, pt states she has not noticed a difference in her breathing. Pt states her SOB is at baseline. Pt denies cough and CP/tightness.     Dyspnea follow up. Associated asthma diagnosis of presumptive one  Currently she reports she is better. She told my medical assistant that her dyspnea is baseline but when I  spoke to her she said that dyspnea is better. It is not noticeable much. She does not want to do pulmonary stress testing. She is now taking only Qvar. She is tolerating this fine. Dyspnea did not get worse when she scaled down from Symbicort to Qvar. We tried to do exhaled nitric oxide testing on her today but she could not follow through. At this point she is content continuing with Qvar    OV 10/18/2015  Chief Complaint  Patient presents with  . Follow-up    pt c/o sob with exertion, noted throat clearing in office.      Follow-up dyspnea in association with presumptive asthma. Last seen December 2016. At that time he gave her Qvar. Now on advair; did not like qvar. Overall she feels stable. She still is bothered by the are proportion dyspnea when she exhibits itself. Last time she refused to have cardiopulmonary stress testing but this time she is inquiring about it and seems open to it.     has a past medical history of Hematuria; Asthma; Hyperlipidemia; Allergic rhinitis; Atypical chest pain; Collapse of right lung; Lung nodules; Arthritis; Glaucoma; Gastroesophageal reflux disease; and Dyslipidemia.   reports that she has never smoked. She has never used smokeless tobacco.  Past Surgical History  Procedure Laterality Date  . Nasal sinus surgery    . Total abdominal hysterectomy w/ bilateral salpingoophorectomy    .  Mediastinoscopy  4/11  . Mass excision  04/11/2012    Procedure: EXCISION MASS;  Surgeon: Nicki Reaper, MD;  Location: [facility];  Service: Orthopedics;  Laterality: Right;  Excision of mass right upper arm  . Cataract extraction Bilateral   . Anal fissure repair      Allergies  Allergen Reactions  . Shellfish Allergy Shortness Of Breath  . Iodine Other (See Comments)    REACTION: " UNSURE TOLD TO LIST AS ALLERGEN BY MD"  . Latex Itching  . Adhesive [Tape] Rash    Immunization History  Administered Date(s) Administered  . Influenza Whole  04/04/2009  . Influenza,inj,Quad PF,36+ Mos 01/03/2015  . Influenza-Unspecified 02/14/2014  . Pneumococcal Polysaccharide-23 06/01/2003  . Pneumococcal-Unspecified 01/03/2015    Family History  Problem Relation Age of Onset  . Cancer Mother   . Heart disease Father      Current outpatient prescriptions:  .  albuterol (PROVENTIL HFA;VENTOLIN HFA) 108 (90 BASE) MCG/ACT inhaler, Inhale 2 puffs into the lungs every 6 (six) hours as needed for wheezing., Disp: , Rfl:  .  Calcium Carb-Cholecalciferol (CALCIUM 600 + D) 600-200 MG-UNIT TABS, Take 1 tablet by mouth daily., Disp: , Rfl:  .  cetirizine (ZYRTEC) 10 MG tablet, Take 10 mg by mouth every morning., Disp: , Rfl:  .  fluticasone (FLONASE) 50 MCG/ACT nasal spray, Place 1 spray into both nostrils 4 (four) times daily as needed for allergies. , Disp: , Rfl:  .  Fluticasone-Salmeterol (ADVAIR DISKUS) 100-50 MCG/DOSE AEPB, Inhale 1 puff into the lungs 2 (two) times daily., Disp: 60 each, Rfl: 3 .  latanoprost (XALATAN) 0.005 % ophthalmic solution, Place 1 drop into both eyes at bedtime., Disp: , Rfl:  .  Multiple Vitamin (MULTIVITAMIN) tablet, Take 1 tablet by mouth every morning. , Disp: , Rfl:  .  pantoprazole (PROTONIX) 40 MG tablet, Take 40 mg by mouth daily as needed. , Disp: , Rfl:  .  pravastatin (PRAVACHOL) 20 MG tablet, Take 20 mg by mouth every evening. , Disp: , Rfl:  .  TURMERIC PO, Take 1 capsule by mouth daily., Disp: , Rfl:  No current facility-administered medications for this visit.  Facility-Administered Medications Ordered in Other Visits:  .  regadenoson (LEXISCAN) injection SOLN 0.4 mg, 0.4 mg, Intravenous, Once, Quintella Reichert, MD    Review of Systems     Objective:   Physical Exam  Constitutional: She is oriented to person, place, and time. She appears well-developed and well-nourished. No distress.  HENT:  Head: Normocephalic and atraumatic.  Right Ear: External ear normal.  Left Ear: External ear normal.   Mouth/Throat: Oropharynx is clear and moist. No oropharyngeal exudate.  Eyes: Conjunctivae and EOM are normal. Pupils are equal, round, and reactive to light. Right eye exhibits no discharge. Left eye exhibits no discharge. No scleral icterus.  Neck: Normal range of motion. Neck supple. No JVD present. No tracheal deviation present. No thyromegaly present.  Cardiovascular: Normal rate, regular rhythm, normal heart sounds and intact distal pulses.  Exam reveals no gallop and no friction rub.   No murmur heard. Pulmonary/Chest: Effort normal and breath sounds normal. No respiratory distress. She has no wheezes. She has no rales. She exhibits no tenderness.  Abdominal: Soft. Bowel sounds are normal. She exhibits no distension and no mass. There is no tenderness. There is no rebound and no guarding.  Musculoskeletal: Normal range of motion. She exhibits no edema or tenderness.  Lymphadenopathy:    She has  no cervical adenopathy.  Neurological: She is alert and oriented to person, place, and time. She has normal reflexes. No cranial nerve deficit. She exhibits normal muscle tone. Coordination normal.  Skin: Skin is warm and dry. No rash noted. She is not diaphoretic. No erythema. No pallor.  Psychiatric:  ? Mildly forgetful  Vitals reviewed.   Filed Vitals:   10/18/15 1109  BP: 134/64  Pulse: 72  Height: 5\' 7"  (1.702 m)  Weight: 164 lb (74.39 kg)  SpO2: 98%        Assessment:       ICD-9-CM ICD-10-CM   1. Dyspnea and respiratory abnormality 786.09 R06.00 Cardiopulmonary exercise test    R06.89        Plan:      Continue advair as before Due to persistence of shortness of breath with fast walking or exertion do bike cpst test  followup - return after bike cpst test   .(> 50% of this 15 min visit spent in face to face counseling or/and coordination of care)   Dr. Kalman Shan, M.D., [facility].C.P Pulmonary and Critical Care Medicine Staff Physician [place]  [facility] [place] [facility] Pager: [phone], If no answer or between  15:00h - 7:00h: call [phone]  10/19/2015 9:39 AM

## 2015-10-18 NOTE — Patient Instructions (Addendum)
ICD-9-CM ICD-10-CM   1. Dyspnea and respiratory abnormality 786.09 R06.00     R06.89    Continue advair as before Due to persistence of shortness of breath with fast walking or exertion do bike cpst test  followup - return after bike cpst test

## 2015-10-25 ENCOUNTER — Ambulatory Visit (HOSPITAL_COMMUNITY): Payer: Medicare Other | Attending: Internal Medicine

## 2015-10-25 ENCOUNTER — Other Ambulatory Visit (HOSPITAL_COMMUNITY): Payer: Self-pay | Admitting: *Deleted

## 2015-10-25 DIAGNOSIS — R0600 Dyspnea, unspecified: Secondary | ICD-10-CM | POA: Diagnosis present

## 2015-10-25 DIAGNOSIS — R0689 Other abnormalities of breathing: Principal | ICD-10-CM

## 2015-10-26 ENCOUNTER — Ambulatory Visit (INDEPENDENT_AMBULATORY_CARE_PROVIDER_SITE_OTHER): Payer: Medicare Other | Admitting: Internal Medicine

## 2015-10-26 ENCOUNTER — Encounter: Payer: Self-pay | Admitting: Internal Medicine

## 2015-10-26 VITALS — BP 134/70 | HR 78 | Ht 67.0 in | Wt 164.0 lb

## 2015-10-26 DIAGNOSIS — I5189 Other ill-defined heart diseases: Secondary | ICD-10-CM | POA: Insufficient documentation

## 2015-10-26 DIAGNOSIS — R0689 Other abnormalities of breathing: Secondary | ICD-10-CM | POA: Diagnosis not present

## 2015-10-26 DIAGNOSIS — I519 Heart disease, unspecified: Secondary | ICD-10-CM | POA: Diagnosis not present

## 2015-10-26 DIAGNOSIS — R0600 Dyspnea, unspecified: Secondary | ICD-10-CM | POA: Diagnosis not present

## 2015-10-26 NOTE — Progress Notes (Signed)
Subjective:     Patient ID: Anne [name], female   DOB: 06/04/1930, 82 y.o.   MRN: [id number]  HPI   OV 10/26/2015  Chief Complaint  Patient presents with  . Follow-up    review CPST.  Pt's breathing is unchanged since last visit.     Anne [name] is here to review CPST done yesterday. Key findings of the pulmonary stress test was that she did an adequate test with a respiratory exchange ratio 1.22. Her VO2 max was 15.6 mL per kilogram ideal body weight 110% of predicted. Which is normal. Anaerobic threshold was 72% and again normal. There is no cardiac arrhythmias. Resting blood pressure was similar to her today 130s over 70. However she had a significantly hypertensive response to exercise with a systolic blood pressure 240 systolic at peak exercise. In association with this her stroke volume, was flat with the end of exercise. This is hypertensive response to exercise associated with diastolic dysfunction. She is not on any baseline antihypertensive drugs.  These results were shared with the patient.      has a past medical history of Allergic rhinitis; Arthritis; Asthma; Atypical chest pain; Collapse of right lung; Dyslipidemia; Gastroesophageal reflux disease; Glaucoma; Hematuria; Hyperlipidemia; and Lung nodules.   reports that she has never smoked. She has never used smokeless tobacco.  Past Surgical History:  Procedure Laterality Date  . ANAL FISSURE REPAIR    . CATARACT EXTRACTION Bilateral   . MASS EXCISION  04/11/2012   Procedure: EXCISION MASS;  Surgeon: Nicki Reaper, MD;  Location: [facility];  Service: Orthopedics;  Laterality: Right;  Excision of mass right upper arm  . MEDIASTINOSCOPY  4/11  . NASAL SINUS SURGERY    . TOTAL ABDOMINAL HYSTERECTOMY W/ BILATERAL SALPINGOOPHORECTOMY      Allergies  Allergen Reactions  . Shellfish Allergy Shortness Of Breath  . Iodine Other (See Comments)    REACTION: " UNSURE TOLD TO LIST AS ALLERGEN BY MD"  . Latex  Itching  . Adhesive [Tape] Rash    Immunization History  Administered Date(s) Administered  . Influenza Whole 04/04/2009  . Influenza,inj,Quad PF,36+ Mos 01/03/2015  . Influenza-Unspecified 02/14/2014  . Pneumococcal Polysaccharide-23 06/01/2003  . Pneumococcal-Unspecified 01/03/2015    Family History  Problem Relation Age of Onset  . Cancer Mother   . Heart disease Father      Current Outpatient Prescriptions:  .  albuterol (PROVENTIL HFA;VENTOLIN HFA) 108 (90 BASE) MCG/ACT inhaler, Inhale 2 puffs into the lungs every 6 (six) hours as needed for wheezing., Disp: , Rfl:  .  Calcium Carb-Cholecalciferol (CALCIUM 600 + D) 600-200 MG-UNIT TABS, Take 1 tablet by mouth daily., Disp: , Rfl:  .  cetirizine (ZYRTEC) 10 MG tablet, Take 10 mg by mouth every morning., Disp: , Rfl:  .  fluticasone (FLONASE) 50 MCG/ACT nasal spray, Place 1 spray into both nostrils 4 (four) times daily as needed for allergies. , Disp: , Rfl:  .  Fluticasone-Salmeterol (ADVAIR DISKUS) 100-50 MCG/DOSE AEPB, Inhale 1 puff into the lungs 2 (two) times daily., Disp: 60 each, Rfl: 3 .  latanoprost (XALATAN) 0.005 % ophthalmic solution, Place 1 drop into both eyes at bedtime., Disp: , Rfl:  .  Multiple Vitamin (MULTIVITAMIN) tablet, Take 1 tablet by mouth every morning. , Disp: , Rfl:  .  pantoprazole (PROTONIX) 40 MG tablet, Take 40 mg by mouth daily as needed. , Disp: , Rfl:  .  pravastatin (PRAVACHOL) 20 MG tablet, Take 20 mg  by mouth every evening. , Disp: , Rfl:  .  TURMERIC PO, Take 1 capsule by mouth daily., Disp: , Rfl:  No current facility-administered medications for this visit.   Facility-Administered Medications Ordered in Other Visits:  .  regadenoson (LEXISCAN) injection SOLN 0.4 mg, 0.4 mg, Intravenous, Once, Quintella Reichert, MD   Review of Systems     Objective:   Physical Exam     Vitals:   10/26/15 1450  BP: 134/70  Pulse: 78  SpO2: 100%  Weight: 164 lb (74.4 kg)  Height: 5\' 7"  (1.702 m)    Discussion only visit Assessment:       ICD-9-CM ICD-10-CM   1. Dyspnea and respiratory abnormality 786.09 R06.00     R06.89   2. Diastolic dysfunction 429.9 I51.9        Plan:     Additional shortness of breath is due to hypertensive response to exercise and associated diastolic dysfunction by a heart muscle was not relaxing well.  Plan - Return to see me in 6 months for asthma as per previous office visit schedule - Please make an appointment with Dr. Pete Glatter a primary care physician or Dr. Garnette Scheuermann a cardiologist to discuss diastolic dysfunction and hypertensive exercise response   (> 50% of this 15 min visit spent in face to face counseling or/and coordination of care)   Dr. Kalman Shan, M.D., [facility].C.P Pulmonary and Critical Care Medicine Staff Physician [facility] [place] [facility] Pager: [phone], If no answer or between  15:00h - 7:00h: call [phone]  10/26/2015 3:20 PM

## 2015-10-26 NOTE — Patient Instructions (Signed)
ICD-9-CM ICD-10-CM   1. Dyspnea and respiratory abnormality 786.09 R06.00     R06.89   2. Diastolic dysfunction [id number] I51.9     Additional shortness of breath is due to hypertensive response to exercise and associated diastolic dysfunction by a heart muscle was not relaxing well.  Plan - Return to see me in 6 months for asthma as per previous office visit schedule - Please make an appointment with Dr. Felipa Eth a primary care physician or Dr. Pernell Dupre a cardiologist to discuss diastolic dysfunction and hypertensive exercise response

## 2015-10-31 DIAGNOSIS — R0600 Dyspnea, unspecified: Secondary | ICD-10-CM | POA: Diagnosis not present

## 2015-11-10 ENCOUNTER — Telehealth: Payer: Self-pay | Admitting: Interventional Cardiology

## 2015-11-10 NOTE — Telephone Encounter (Signed)
New message      Pt c/o of Chest Pain: STAT if CP now or developed within 24 hours  1. Are you having CP right now? no 2. Are you experiencing any other symptoms (ex. SOB, nausea, vomiting, sweating)? No, however, pt has asthma and sometimes has sob 3. How long have you been experiencing CP?  Started last night  4. Is your CP continuous or coming and going? Comes and goes 5. Have you taken Nitroglycerin? no ?

## 2015-11-10 NOTE — Telephone Encounter (Signed)
Spoke with patient who states she called to make an appointment with Dr. Tamala Julian as recommended by Dr. Chase Caller.  She states she felt some chest congestion and pressure last night.  States she did some breathing exercises for stress and discomfort resolved.  States she was laying in the bed at the time.  Denies pain with exertion.  States she feels that she needs to be seen to f/u as advised by Dr. Chase Caller.  Patient was offered an appointment in November by the scheduler but would like to be seen sooner.  She states she does not have any discomfort today.  I advised her that I will forward message to Dr. Thompson Caul CMA, [facility] for appointment.  Patient verbalized understanding and agreement.

## 2015-11-11 ENCOUNTER — Other Ambulatory Visit: Payer: Self-pay | Admitting: Internal Medicine

## 2015-11-11 NOTE — Telephone Encounter (Signed)
Pt called today returning this office call will schedule appt. Per [facility]

## 2015-11-11 NOTE — Telephone Encounter (Signed)
Called pt as requested to schedule a work in appt. lmtcb if assistance is still needed

## 2015-11-15 ENCOUNTER — Ambulatory Visit (INDEPENDENT_AMBULATORY_CARE_PROVIDER_SITE_OTHER): Payer: Medicare Other | Admitting: Interventional Cardiology

## 2015-11-15 ENCOUNTER — Encounter: Payer: Self-pay | Admitting: Interventional Cardiology

## 2015-11-15 VITALS — BP 150/60 | HR 72 | Ht 67.0 in | Wt 164.0 lb

## 2015-11-15 DIAGNOSIS — I5189 Other ill-defined heart diseases: Secondary | ICD-10-CM

## 2015-11-15 DIAGNOSIS — I519 Heart disease, unspecified: Secondary | ICD-10-CM

## 2015-11-15 DIAGNOSIS — R0789 Other chest pain: Secondary | ICD-10-CM

## 2015-11-15 DIAGNOSIS — J4520 Mild intermittent asthma, uncomplicated: Secondary | ICD-10-CM

## 2015-11-15 DIAGNOSIS — I10 Essential (primary) hypertension: Secondary | ICD-10-CM | POA: Diagnosis not present

## 2015-11-15 MED ORDER — AMLODIPINE BESYLATE 2.5 MG PO TABS: 2.5000 mg | ORAL_TABLET | Freq: Every day | ORAL | 6 refills | Status: DC

## 2015-11-15 MED ORDER — ASPIRIN EC 81 MG PO TBEC: 81.0000 mg | DELAYED_RELEASE_TABLET | Freq: Every day | ORAL | 3 refills | Status: DC

## 2015-11-15 NOTE — Progress Notes (Signed)
Cardiology Office Note    Date:  11/15/2015   ID:  Anne [name], DOB 01/05/1931, MRN [id number]  PCP:  Ginette Otto, MD  Cardiologist: Lesleigh Noe, MD   Chief Complaint  Patient presents with  . Chest Pain    History of Present Illness:  Anne [name] is a 82 y.o. female follow-up of noncardiac chest pain, hyperlipidemia, coronary CT without evidence of significant obstructive disease (remote) and family history of CAD.  Recently had a cardiopulmonary stress test and developed severe systolic hypertension at 240 mmHg. She was asked to return for cardiac evaluation at Dr. Marchelle Gearing. She has not had chest discomfort. No episodes of syncope. No prior history of hypertension. Physically and emotionally under stress because of her husband's dementia and mounting personal responsibility.  As always, she is concerned about any potential vascular diagnosis and problem. At the same time she is reluctant to except therapy.  Past Medical History:  Diagnosis Date  . Allergic rhinitis   . Arthritis   . Asthma   . Atypical chest pain   . Collapse of right lung   . Dyslipidemia   . Gastroesophageal reflux disease   . Glaucoma   . Hematuria   . Hyperlipidemia   . Lung nodules     Past Surgical History:  Procedure Laterality Date  . ANAL FISSURE REPAIR    . CATARACT EXTRACTION Bilateral   . MASS EXCISION  04/11/2012   Procedure: EXCISION MASS;  Surgeon: Nicki Reaper, MD;  Location: [facility];  Service: Orthopedics;  Laterality: Right;  Excision of mass right upper arm  . MEDIASTINOSCOPY  4/11  . NASAL SINUS SURGERY    . TOTAL ABDOMINAL HYSTERECTOMY W/ BILATERAL SALPINGOOPHORECTOMY      Current Medications: Outpatient Medications Prior to Visit  Medication Sig Dispense Refill  . ADVAIR DISKUS 100-50 MCG/DOSE AEPB INHALE 1 PUFF 2 (TWO) TIMES DAILY. 60 each 3  . albuterol (PROVENTIL HFA;VENTOLIN HFA) 108 (90 BASE) MCG/ACT inhaler Inhale 2 puffs into the  lungs every 6 (six) hours as needed for wheezing.    . Calcium Carb-Cholecalciferol (CALCIUM 600 + D) 600-200 MG-UNIT TABS Take 1 tablet by mouth daily.    . cetirizine (ZYRTEC) 10 MG tablet Take 10 mg by mouth every morning.    . fluticasone (FLONASE) 50 MCG/ACT nasal spray Place 1 spray into both nostrils 4 (four) times daily as needed for allergies.     Marland Kitchen latanoprost (XALATAN) 0.005 % ophthalmic solution Place 1 drop into both eyes at bedtime.    . Multiple Vitamin (MULTIVITAMIN) tablet Take 1 tablet by mouth every morning.     . pantoprazole (PROTONIX) 40 MG tablet Take 40 mg by mouth daily as needed (REFLUX).     Marland Kitchen pravastatin (PRAVACHOL) 20 MG tablet Take 20 mg by mouth every evening.     . TURMERIC PO Take 1 capsule by mouth daily.     Facility-Administered Medications Prior to Visit  Medication Dose Route Frequency Provider Last Rate Last Dose  . regadenoson (LEXISCAN) injection SOLN 0.4 mg  0.4 mg Intravenous Once Quintella Reichert, MD         Allergies:   Shellfish allergy; Iodine; Latex; and Adhesive [tape]   Social History   Social History  . Marital status: Married    Spouse name: N/A  . Number of children: N/A  . Years of education: N/A   Occupational History  . retired    Social History Main Topics  .  Smoking status: Never Smoker  . Smokeless tobacco: Never Used  . Alcohol use No  . Drug use: No  . Sexual activity: No   Other Topics Concern  . None   Social History Narrative  . None     Family History:  The patient's family history includes Cancer in her mother; Heart disease in her father.   ROS:   Please see the history of present illness.    Occasional shortness of breath when lying down. She has been having upper respiratory/sinus congestion.  All other systems reviewed and are negative.   PHYSICAL EXAM:   VS:  BP (!) 150/60   Pulse 72   Ht 5\' 7"  (1.702 m)   Wt 164 lb (74.4 kg)   BMI 25.69 kg/m    GEN: Well nourished, well developed, in no  acute distress  HEENT: normal  Neck: no JVD, carotid bruits, or masses Cardiac: RRR; no murmurs, rubs, or gallops,no edema  Respiratory:  clear to auscultation bilaterally, normal work of breathing GI: soft, nontender, nondistended, + BS MS: no deformity or atrophy  Skin: warm and dry, no rash Neuro:  Alert and Oriented x 3, Strength and sensation are intact Psych: euthymic mood, full affect  Wt Readings from Last 3 Encounters:  11/15/15 164 lb (74.4 kg)  10/26/15 164 lb (74.4 kg)  10/18/15 164 lb (74.4 kg)      Studies/Labs Reviewed:   EKG:  EKG  Is performed today and is normal.  Recent Labs: 03/17/2015: B Natriuretic Peptide 103.9; BUN 15; Creatinine, Ser 0.98; Hemoglobin 11.5; Platelets 283; Potassium 3.9; Sodium 140   Lipid Panel No results found for: CHOL, TRIG, HDL, CHOLHDL, VLDL, LDLCALC, LDLDIRECT  Additional studies/ records that were reviewed today include:  The cardiopulmonary stress test was reviewed. Duration greater than 8 minutes. Significant blood pressure elevation 238/65 mmHg..    ASSESSMENT:    1. Essential hypertension   2. Chest tightness   3. Diastolic dysfunction   4. Asthma, mild intermittent, uncomplicated      PLAN:  In order of problems listed above:  1. This is a new diagnosis. Pressures elevated today. I have recommended starting low-dose amlodipine 2-1/2 mg per day. I'm a little bit concerned that this diagnosis has suddenly developed raising the question of renal artery stenosis. We will monitor and see if the pressure is easily treatable. If it is a will be no need to do any specific workup. I did discuss salt restriction. 2. Not currently an issue. 3. Clinical diagnosis based upon the recent cardiopulmonary stress test. It is likely hypertension related. There is no evidence of myocardial ischemia. Prior ischemia evaluation has been negative as has a coronary CT angio    Medication Adjustments/Labs and Tests Ordered: Current  medicines are reviewed at length with the patient today.  Concerns regarding medicines are outlined above.  Medication changes, Labs and Tests ordered today are listed in the Patient Instructions below. Patient Instructions  Medication Instructions:   START AMLODIPINE 2.5 MG ONCE DAILY  START ASPIRIN 81 MG ONCE DAILY WITH FOOD  Follow-Up:  Your physician recommends that you schedule a follow-up appointment in: ONE MONTH WITH DR Katrinka Blazing    Low-Sodium Eating Plan Sodium raises blood pressure and causes water to be held in the body. Getting less sodium from food will help lower your blood pressure, reduce any swelling, and protect your heart, liver, and kidneys. We get sodium by adding salt (sodium chloride) to food. Most of our sodium comes  from canned, boxed, and frozen foods. Restaurant foods, fast foods, and pizza are also very high in sodium. Even if you take medicine to lower your blood pressure or to reduce fluid in your body, getting less sodium from your food is important. WHAT IS MY PLAN? Most people should limit their sodium intake to 2,300 mg a day. Your health care provider recommends that you limit your sodium intake to __________ a day.  WHAT DO I NEED TO KNOW ABOUT THIS EATING PLAN? For the low-sodium eating plan, you will follow these general guidelines:  Choose foods with a % Daily Value for sodium of less than 5% (as listed on the food label).   Use salt-free seasonings or herbs instead of table salt or sea salt.   Check with your health care provider or pharmacist before using salt substitutes.   Eat fresh foods.  Eat more vegetables and fruits.  Limit canned vegetables. If you do use them, rinse them well to decrease the sodium.   Limit cheese to 1 oz (28 g) per day.   Eat lower-sodium products, often labeled as "lower sodium" or "no salt added."  Avoid foods that contain monosodium glutamate (MSG). MSG is sometimes added to [place] food and some canned  foods.  Check food labels (Nutrition Facts labels) on foods to learn how much sodium is in one serving.  Eat more home-cooked food and less restaurant, buffet, and fast food.  When eating at a restaurant, ask that your food be prepared with less salt, or no salt if possible.  HOW DO I READ FOOD LABELS FOR SODIUM INFORMATION? The Nutrition Facts label lists the amount of sodium in one serving of the food. If you eat more than one serving, you must multiply the listed amount of sodium by the number of servings. Food labels may also identify foods as:  Sodium free--Less than 5 mg in a serving.  Very low sodium--35 mg or less in a serving.  Low sodium--140 mg or less in a serving.  Light in sodium--50% less sodium in a serving. For example, if a food that usually has 300 mg of sodium is changed to become light in sodium, it will have 150 mg of sodium.  Reduced sodium--25% less sodium in a serving. For example, if a food that usually has 400 mg of sodium is changed to reduced sodium, it will have 300 mg of sodium. WHAT FOODS CAN I EAT? Grains Low-sodium cereals, including oats, puffed wheat and rice, and shredded wheat cereals. Low-sodium crackers. Unsalted rice and pasta. Lower-sodium bread.  Vegetables Frozen or fresh vegetables. Low-sodium or reduced-sodium canned vegetables. Low-sodium or reduced-sodium tomato sauce and paste. Low-sodium or reduced-sodium tomato and vegetable juices.  Fruits Fresh, frozen, and canned fruit. Fruit juice.  Meat and Other Protein Products Low-sodium canned tuna and salmon. Fresh or frozen meat, poultry, seafood, and fish. Lamb. Unsalted nuts. Dried beans, peas, and lentils without added salt. Unsalted canned beans. Homemade soups without salt. Eggs.  Dairy Milk. Soy milk. Ricotta cheese. Low-sodium or reduced-sodium cheeses. Yogurt.  Condiments Fresh and dried herbs and spices. Salt-free seasonings. Onion and garlic powders. Low-sodium varieties  of mustard and ketchup. Fresh or refrigerated horseradish. Lemon juice.  Fats and Oils Reduced-sodium salad dressings. Unsalted butter.  Other Unsalted popcorn and pretzels.  The items listed above may not be a complete list of recommended foods or beverages. Contact your dietitian for more options. WHAT FOODS ARE NOT RECOMMENDED? Grains Instant hot cereals. Bread stuffing, pancake, and  biscuit mixes. Croutons. Seasoned rice or pasta mixes. Noodle soup cups. Boxed or frozen macaroni and cheese. Self-rising flour. Regular salted crackers. Vegetables Regular canned vegetables. Regular canned tomato sauce and paste. Regular tomato and vegetable juices. Frozen vegetables in sauces. Salted [place] fries. Olives. Rosita Fire. Relishes. Sauerkraut. Salsa. Meat and Other Protein Products Salted, canned, smoked, spiced, or pickled meats, seafood, or fish. Bacon, ham, sausage, hot dogs, corned beef, chipped beef, and packaged luncheon meats. Salt pork. Jerky. Pickled herring. Anchovies, regular canned tuna, and sardines. Salted nuts. Dairy Processed cheese and cheese spreads. Cheese curds. Blue cheese and cottage cheese. Buttermilk.  Condiments Onion and garlic salt, seasoned salt, table salt, and sea salt. Canned and packaged gravies. Worcestershire sauce. Tartar sauce. Barbecue sauce. Teriyaki sauce. Soy sauce, including reduced sodium. Steak sauce. Fish sauce. Oyster sauce. Cocktail sauce. Horseradish that you find on the shelf. Regular ketchup and mustard. Meat flavorings and tenderizers. Bouillon cubes. Hot sauce. Tabasco sauce. Marinades. Taco seasonings. Relishes. Fats and Oils Regular salad dressings. Salted butter. Margarine. Ghee. Bacon fat.  Other Potato and tortilla chips. Corn chips and puffs. Salted popcorn and pretzels. Canned or dried soups. Pizza. Frozen entrees and pot pies.  The items listed above may not be a complete list of foods and beverages to avoid. Contact your dietitian  for more information.   This information is not intended to replace advice given to you by your health care provider. Make sure you discuss any questions you have with your health care provider.   Document Released: 09/08/2001 Document Revised: 04/09/2014 Document Reviewed: 01/21/2013 Elsevier Interactive Patient Education [employer]       Signed, Lesleigh Noe, MD  11/15/2015 10:42 AM    [facility] [address] [place], [place], [place]  [address] Phone: [phone]; Fax: [phone]

## 2015-11-15 NOTE — Patient Instructions (Signed)
Medication Instructions:   START AMLODIPINE 2.5 MG ONCE DAILY  START ASPIRIN 81 MG ONCE DAILY WITH FOOD  Follow-Up:  Your physician recommends that you schedule a follow-up appointment in: [place]    Low-Sodium Eating Plan Sodium raises blood pressure and causes water to be held in the body. Getting less sodium from food will help lower your blood pressure, reduce any swelling, and protect your heart, liver, and kidneys. We get sodium by adding salt (sodium chloride) to food. Most of our sodium comes from canned, boxed, and frozen foods. Restaurant foods, fast foods, and pizza are also very high in sodium. Even if you take medicine to lower your blood pressure or to reduce fluid in your body, getting less sodium from your food is important. WHAT IS MY PLAN? Most people should limit their sodium intake to 2,300 mg a day. Your health care provider recommends that you limit your sodium intake to __________ a day.  WHAT DO I NEED TO KNOW ABOUT THIS EATING PLAN? For the low-sodium eating plan, you will follow these general guidelines:  Choose foods with a % Daily Value for sodium of less than 5% (as listed on the food label).   Use salt-free seasonings or herbs instead of table salt or sea salt.   Check with your health care provider or pharmacist before using salt substitutes.   Eat fresh foods.  Eat more vegetables and fruits.  Limit canned vegetables. If you do use them, rinse them well to decrease the sodium.   Limit cheese to 1 oz (28 g) per day.   Eat lower-sodium products, often labeled as "lower sodium" or "no salt added."  Avoid foods that contain monosodium glutamate (MSG). MSG is sometimes added to [place] food and some canned foods.  Check food labels (Nutrition Facts labels) on foods to learn how much sodium is in one serving.  Eat more home-cooked food and less restaurant, buffet, and fast food.  When eating at a restaurant, ask that your food  be prepared with less salt, or no salt if possible.  HOW DO I READ FOOD LABELS FOR SODIUM INFORMATION? The Nutrition Facts label lists the amount of sodium in one serving of the food. If you eat more than one serving, you must multiply the listed amount of sodium by the number of servings. Food labels may also identify foods as:  Sodium free--Less than 5 mg in a serving.  Very low sodium--35 mg or less in a serving.  Low sodium--140 mg or less in a serving.  Light in sodium--50% less sodium in a serving. For example, if a food that usually has 300 mg of sodium is changed to become light in sodium, it will have 150 mg of sodium.  Reduced sodium--25% less sodium in a serving. For example, if a food that usually has 400 mg of sodium is changed to reduced sodium, it will have 300 mg of sodium. WHAT FOODS CAN I EAT? Grains Low-sodium cereals, including oats, puffed wheat and rice, and shredded wheat cereals. Low-sodium crackers. Unsalted rice and pasta. Lower-sodium bread.  Vegetables Frozen or fresh vegetables. Low-sodium or reduced-sodium canned vegetables. Low-sodium or reduced-sodium tomato sauce and paste. Low-sodium or reduced-sodium tomato and vegetable juices.  Fruits Fresh, frozen, and canned fruit. Fruit juice.  Meat and Other Protein Products Low-sodium canned tuna and salmon. Fresh or frozen meat, poultry, seafood, and fish. Lamb. Unsalted nuts. Dried beans, peas, and lentils without added salt. Unsalted canned beans. Homemade soups  without salt. Eggs.  Dairy Milk. Soy milk. Ricotta cheese. Low-sodium or reduced-sodium cheeses. Yogurt.  Condiments Fresh and dried herbs and spices. Salt-free seasonings. Onion and garlic powders. Low-sodium varieties of mustard and ketchup. Fresh or refrigerated horseradish. Lemon juice.  Fats and Oils Reduced-sodium salad dressings. Unsalted butter.  Other Unsalted popcorn and pretzels.  The items listed above may not be a complete  list of recommended foods or beverages. Contact your dietitian for more options. WHAT FOODS ARE NOT RECOMMENDED? Grains Instant hot cereals. Bread stuffing, pancake, and biscuit mixes. Croutons. Seasoned rice or pasta mixes. Noodle soup cups. Boxed or frozen macaroni and cheese. Self-rising flour. Regular salted crackers. Vegetables Regular canned vegetables. Regular canned tomato sauce and paste. Regular tomato and vegetable juices. Frozen vegetables in sauces. Salted [place] fries. Olives. Angie Fava. Relishes. Sauerkraut. Salsa. Meat and Other Protein Products Salted, canned, smoked, spiced, or pickled meats, seafood, or fish. Bacon, ham, sausage, hot dogs, corned beef, chipped beef, and packaged luncheon meats. Salt pork. Jerky. Pickled herring. Anchovies, regular canned tuna, and sardines. Salted nuts. Dairy Processed cheese and cheese spreads. Cheese curds. Blue cheese and cottage cheese. Buttermilk.  Condiments Onion and garlic salt, seasoned salt, table salt, and sea salt. Canned and packaged gravies. Worcestershire sauce. Tartar sauce. Barbecue sauce. Teriyaki sauce. Soy sauce, including reduced sodium. Steak sauce. Fish sauce. Oyster sauce. Cocktail sauce. Horseradish that you find on the shelf. Regular ketchup and mustard. Meat flavorings and tenderizers. Bouillon cubes. Hot sauce. Tabasco sauce. Marinades. Taco seasonings. Relishes. Fats and Oils Regular salad dressings. Salted butter. Margarine. Ghee. Bacon fat.  Other Potato and tortilla chips. Corn chips and puffs. Salted popcorn and pretzels. Canned or dried soups. Pizza. Frozen entrees and pot pies.  The items listed above may not be a complete list of foods and beverages to avoid. Contact your dietitian for more information.   This information is not intended to replace advice given to you by your health care provider. Make sure you discuss any questions you have with your health care provider.   Document Released:  09/08/2001 Document Revised: 04/09/2014 Document Reviewed: 01/21/2013 Elsevier Interactive Patient Education [employer].

## 2015-11-28 ENCOUNTER — Telehealth: Payer: Self-pay | Admitting: Interventional Cardiology

## 2015-11-28 NOTE — Telephone Encounter (Signed)
Spoke with pt. Pt sts that since starting Amlodipine she has been experiencing frequent urination and cramping in her legs and wants to know if Amlodipine is a diuretic. Adv pt that it was not, adv her that I did not think that her complaints are related to the medication. Amlodipine is usually well tolerated and she is on a very low dose. Pt rqst Dr.Smith be updated. Adv pt that I will fwd the message to him and call back if he has any additional recommendations. Pt agreeable with plan and verbalized understanding.

## 2015-11-28 NOTE — Telephone Encounter (Signed)
Agree with advice to her. She needs to keep [place] posted.

## 2015-11-28 NOTE — Telephone Encounter (Signed)
Anne [name] is calling because she is now taking Amlodpine and since taking it and  has been having some numbness in her left arm mainly at night as well as terrible leg cramps .  Not sure if they are coming from the medication .Marland KitchenPlease call

## 2015-11-30 NOTE — Telephone Encounter (Signed)
Called to pt Dr.Smith's response. [email or web address]

## 2015-12-01 NOTE — Telephone Encounter (Signed)
Follow up  Pt returning nurses call.  Please follow up with pt. Thanks!

## 2015-12-01 NOTE — Telephone Encounter (Signed)
Called patient back and informed patient of Dr. Thompson Caul message. Patient stated that she stopped amlodipine on Monday, but she had symptoms yesterday and some today. Patient will start taking her amlodipine again. Patient wants to know why she has numbness in her arm when she wakes up in the morning. Informed patient that she might be sleeping on her arm. Patient states that her arm gets better through out the morning. Advised patient to call her PCP for her symptoms. Patient verbalized understanding.

## 2015-12-19 DIAGNOSIS — I10 Essential (primary) hypertension: Secondary | ICD-10-CM | POA: Insufficient documentation

## 2015-12-20 ENCOUNTER — Ambulatory Visit (INDEPENDENT_AMBULATORY_CARE_PROVIDER_SITE_OTHER): Payer: Medicare Other | Admitting: Interventional Cardiology

## 2015-12-20 ENCOUNTER — Encounter: Payer: Self-pay | Admitting: Interventional Cardiology

## 2015-12-20 ENCOUNTER — Encounter (INDEPENDENT_AMBULATORY_CARE_PROVIDER_SITE_OTHER): Payer: Self-pay

## 2015-12-20 VITALS — BP 128/56 | HR 70 | Ht 67.0 in | Wt 163.6 lb

## 2015-12-20 DIAGNOSIS — I10 Essential (primary) hypertension: Secondary | ICD-10-CM | POA: Diagnosis not present

## 2015-12-20 DIAGNOSIS — R0789 Other chest pain: Secondary | ICD-10-CM | POA: Diagnosis not present

## 2015-12-20 NOTE — Progress Notes (Signed)
Cardiology Office Note    Date:  12/20/2015   ID:  Anne [name], DOB February 08, 1931, MRN [id number]  PCP:  Ginette Otto, MD  Cardiologist: Lesleigh Noe, MD   Chief Complaint  Patient presents with  . Follow-up    BP    History of Present Illness:  Anne [name] is a 82 y.o. female anxiety disorder, hypertension, recurring atypical chest pain, and history of asthma.  This is a follow-up appointment after starting amlodipine. She developed arm numbness on amlodipine and the medication regimen was switched to diltiazem CD 120 mg per day. She is tolerating this medication without difficulty. Blood pressures have ranged between 101 130 mmHg systolic.  Past Medical History:  Diagnosis Date  . Allergic rhinitis   . Arthritis   . Asthma   . Atypical chest pain   . Collapse of right lung   . Dyslipidemia   . Gastroesophageal reflux disease   . Glaucoma   . Hematuria   . Hyperlipidemia   . Lung nodules     Past Surgical History:  Procedure Laterality Date  . ANAL FISSURE REPAIR    . CATARACT EXTRACTION Bilateral   . MASS EXCISION  04/11/2012   Procedure: EXCISION MASS;  Surgeon: Nicki Reaper, MD;  Location: [facility];  Service: Orthopedics;  Laterality: Right;  Excision of mass right upper arm  . MEDIASTINOSCOPY  4/11  . NASAL SINUS SURGERY    . TOTAL ABDOMINAL HYSTERECTOMY W/ BILATERAL SALPINGOOPHORECTOMY      Current Medications: Outpatient Medications Prior to Visit  Medication Sig Dispense Refill  . ADVAIR DISKUS 100-50 MCG/DOSE AEPB INHALE 1 PUFF 2 (TWO) TIMES DAILY. 60 each 3  . albuterol (PROVENTIL HFA;VENTOLIN HFA) 108 (90 BASE) MCG/ACT inhaler Inhale 2 puffs into the lungs every 6 (six) hours as needed for wheezing.    Marland Kitchen aspirin EC 81 MG tablet Take 1 tablet (81 mg total) by mouth daily. 90 tablet 3  . Calcium Carb-Cholecalciferol (CALCIUM 600 + D) 600-200 MG-UNIT TABS Take 2 tablets by mouth daily.     . cetirizine (ZYRTEC) 10 MG tablet  Take 10 mg by mouth every morning.    . fluticasone (FLONASE) 50 MCG/ACT nasal spray Place 1 spray into both nostrils 2 (two) times daily as needed for allergies.     Marland Kitchen latanoprost (XALATAN) 0.005 % ophthalmic solution Place 1 drop into both eyes at bedtime.    . Multiple Vitamin (MULTIVITAMIN) tablet Take 1 tablet by mouth every morning.     . pantoprazole (PROTONIX) 40 MG tablet Take 40 mg by mouth daily as needed (REFLUX).     Marland Kitchen pravastatin (PRAVACHOL) 20 MG tablet Take 20 mg by mouth every evening.     . TURMERIC PO Take 1 capsule by mouth daily.    Marland Kitchen amLODipine (NORVASC) 2.5 MG tablet Take 1 tablet (2.5 mg total) by mouth daily. (Patient not taking: Reported on 12/20/2015) 30 tablet 6   Facility-Administered Medications Prior to Visit  Medication Dose Route Frequency Provider Last Rate Last Dose  . regadenoson (LEXISCAN) injection SOLN 0.4 mg  0.4 mg Intravenous Once Quintella Reichert, MD         Allergies:   Shellfish allergy; Iodine; Latex; and Adhesive [tape]   Social History   Social History  . Marital status: Married    Spouse name: N/A  . Number of children: N/A  . Years of education: N/A   Occupational History  . retired  Social History Main Topics  . Smoking status: Never Smoker  . Smokeless tobacco: Never Used  . Alcohol use No  . Drug use: No  . Sexual activity: No   Other Topics Concern  . None   Social History Narrative  . None     Family History:  The patient's family history includes Cancer in her mother; Heart disease in her father.   ROS:   Please see the history of present illness.    Muscle pain, dyspnea on exertion, constipation, leg pain.  All other systems reviewed and are negative.   PHYSICAL EXAM:   VS:  BP (!) 128/56   Pulse 70   Ht 5\' 7"  (1.702 m)   Wt 163 lb 9.6 oz (74.2 kg)   BMI 25.62 kg/m    GEN: Well nourished, well developed, in no acute distress  HEENT: normal  Neck: no JVD, carotid bruits, or masses Cardiac: RRR; no  murmurs, rubs, or gallops,no edema  Respiratory:  clear to auscultation bilaterally, normal work of breathing GI: soft, nontender, nondistended, + BS MS: no deformity or atrophy  Skin: warm and dry, no rash Neuro:  Alert and Oriented x 3, Strength and sensation are intact Psych: euthymic mood, full affect  Wt Readings from Last 3 Encounters:  12/20/15 163 lb 9.6 oz (74.2 kg)  11/15/15 164 lb (74.4 kg)  10/26/15 164 lb (74.4 kg)      Studies/Labs Reviewed:   EKG:  EKG  Not repeated  Recent Labs: 03/17/2015: B Natriuretic Peptide 103.9; BUN 15; Creatinine, Ser 0.98; Hemoglobin 11.5; Platelets 283; Potassium 3.9; Sodium 140   Lipid Panel No results found for: CHOL, TRIG, HDL, CHOLHDL, VLDL, LDLCALC, LDLDIRECT  Additional studies/ records that were reviewed today include:  None    ASSESSMENT:    1. Chest tightness   2. Essential hypertension      PLAN:  In order of problems listed above:  1. Resolved 2. Much better controlled. 2 g sodium diet.    Medication Adjustments/Labs and Tests Ordered: Current medicines are reviewed at length with the patient today.  Concerns regarding medicines are outlined above.  Medication changes, Labs and Tests ordered today are listed in the Patient Instructions below. There are no Patient Instructions on file for this visit.   Signed, Lesleigh Noe, MD  12/20/2015 11:10 AM    [facility] [address] [place], [place], [place]  [address] Phone: [phone]; Fax: [phone]

## 2015-12-20 NOTE — Patient Instructions (Signed)
Medication Instructions:  None  Labwork: None  Testing/Procedures: None  Follow-Up: Your physician recommends that you schedule a follow-up appointment as needed with Dr. Smith.    Any Other Special Instructions Will Be Listed Below (If Applicable).     If you need a refill on your cardiac medications before your next appointment, please call your pharmacy.   

## 2016-01-25 DIAGNOSIS — M654 Radial styloid tenosynovitis [de Quervain]: Secondary | ICD-10-CM | POA: Insufficient documentation

## 2016-01-25 DIAGNOSIS — M1811 Unilateral primary osteoarthritis of first carpometacarpal joint, right hand: Secondary | ICD-10-CM | POA: Insufficient documentation

## 2016-01-25 DIAGNOSIS — M65311 Trigger thumb, right thumb: Secondary | ICD-10-CM | POA: Insufficient documentation

## 2016-02-10 IMAGING — CT CT CHEST HIGH RESOLUTION W/O CM
3 of 7 series · 13 of 36 positions shown, 15 images · non-contrast
Comparison: Chest CT 06/09/2009.  PET-CT 07/08/2009.

CLINICAL DATA: 83-year-old female with increasing shortness of
breath for the past 2 months. Evaluate for interstitial lung
disease.

EXAM:
CT CHEST WITHOUT CONTRAST
TECHNIQUE: Multidetector CT imaging of the chest was performed following the
standard protocol without intravenous contrast. High resolution
imaging of the lungs, as well as inspiratory and expiratory imaging,
was performed.

[Series 5: lung · axial · 0.66mm/px · z∈[-248,-33]mm · 7 of 59 slices shown, 9 images]
[im 8/59  mediastinal]
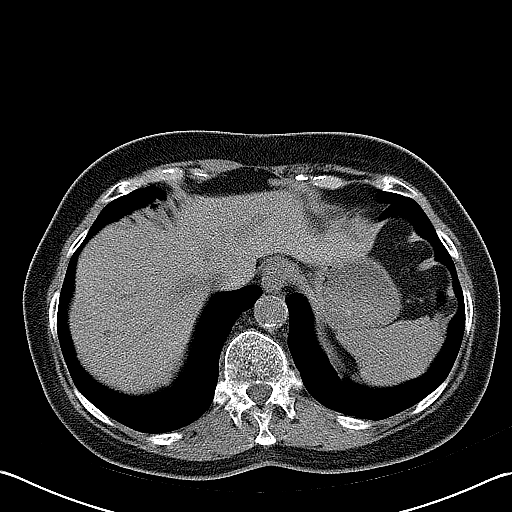
[im 8/59  lung]
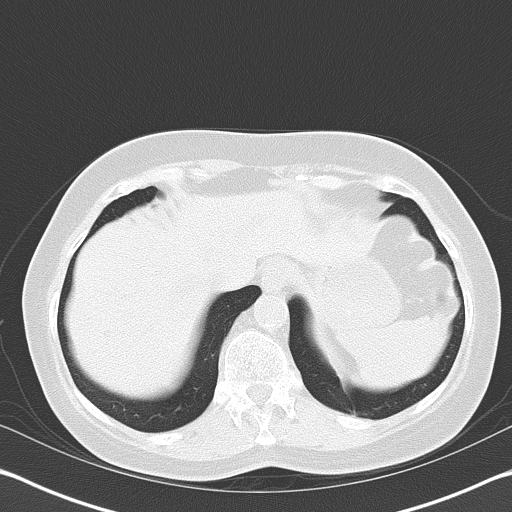
[im 15/59  lung]
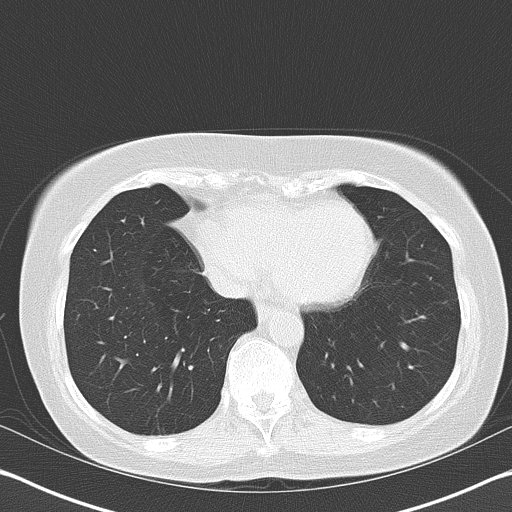
[im 22/59  lung]
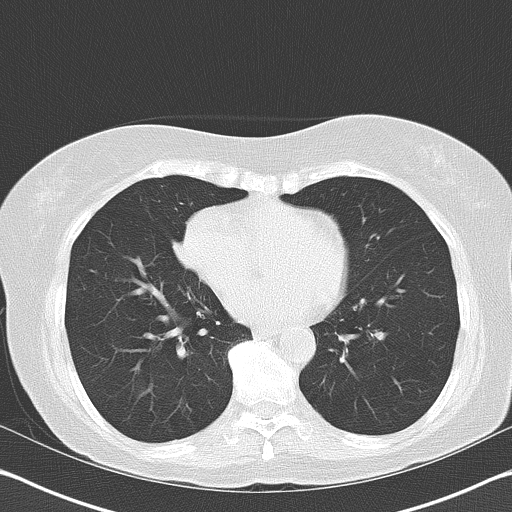
[im 30/59  lung]
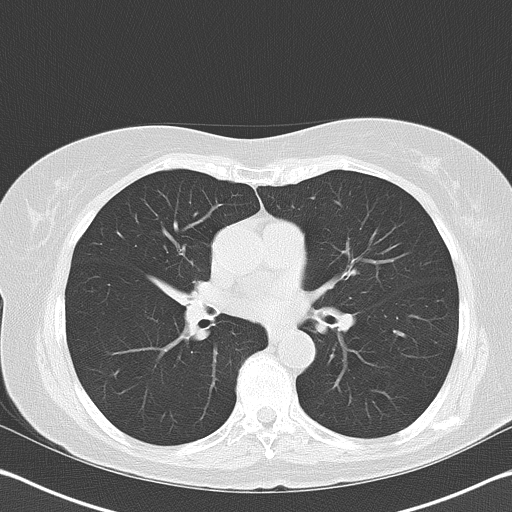
[im 37/59  mediastinal]
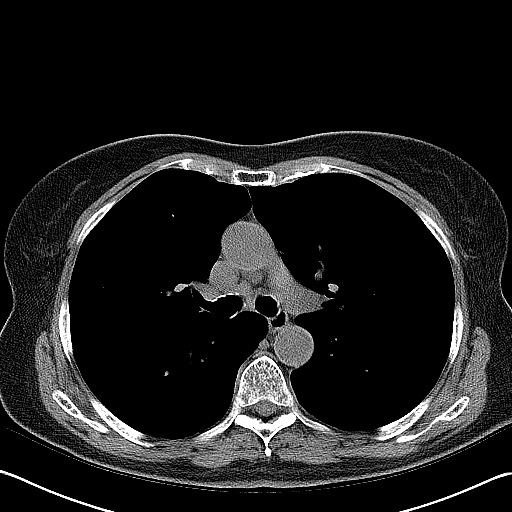
[im 37/59  lung]
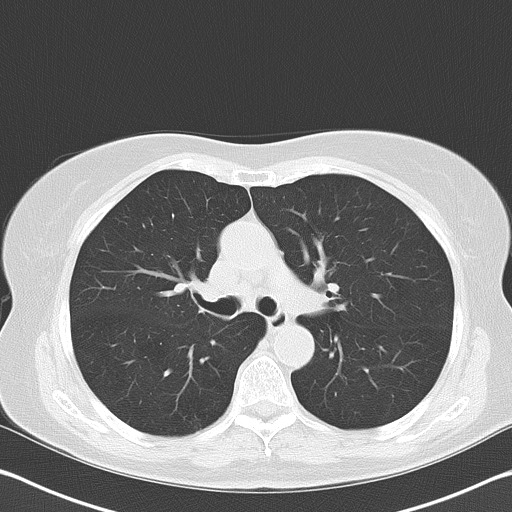
[im 44/59  lung]
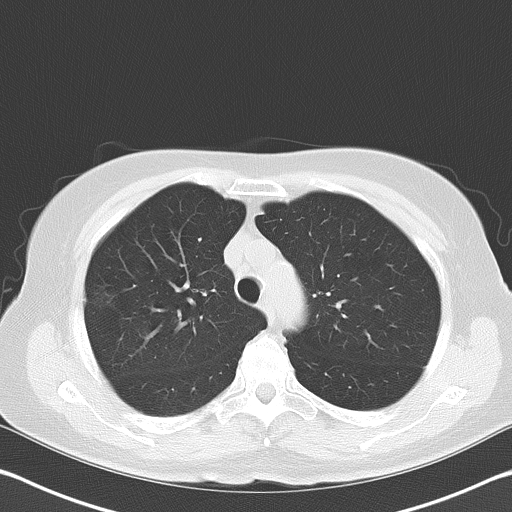
[im 51/59  lung]
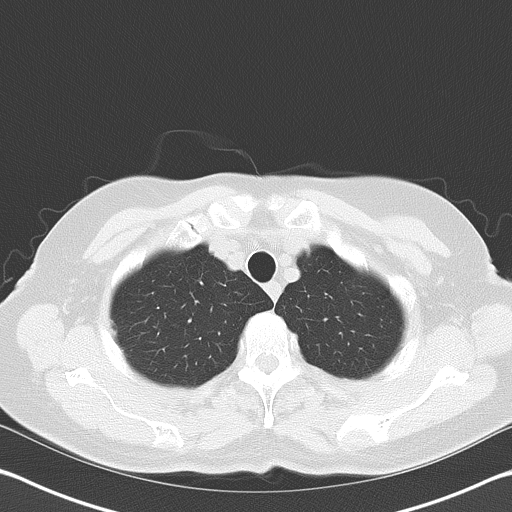

[Series 8: hires retro entire lungs · axial · 0.66mm/px · z∈[-208,-68]mm · 3 of 30 slices shown]
[im 8/30  lung]
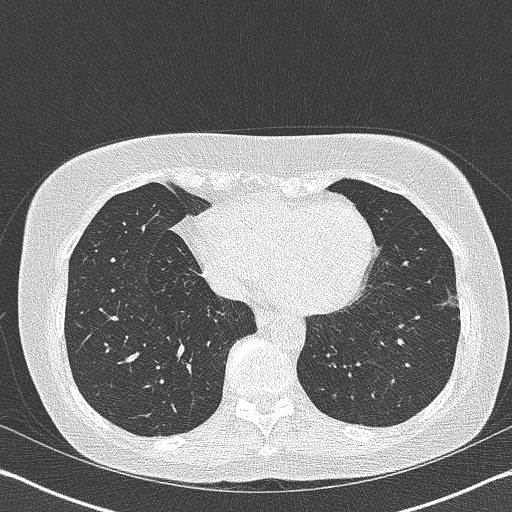
[im 15/30  lung]
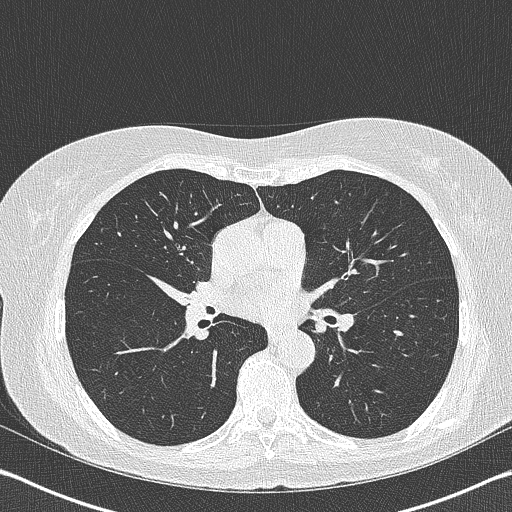
[im 22/30  lung]
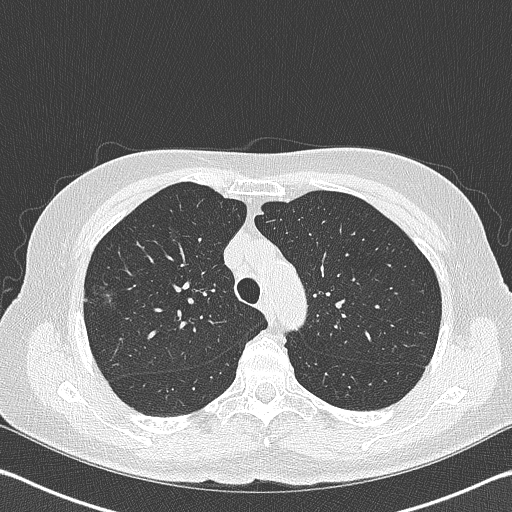

[Series 602: cor · coronal · 0.66mm/px · 3 of 108 slices shown]
[im 22/108  lung]
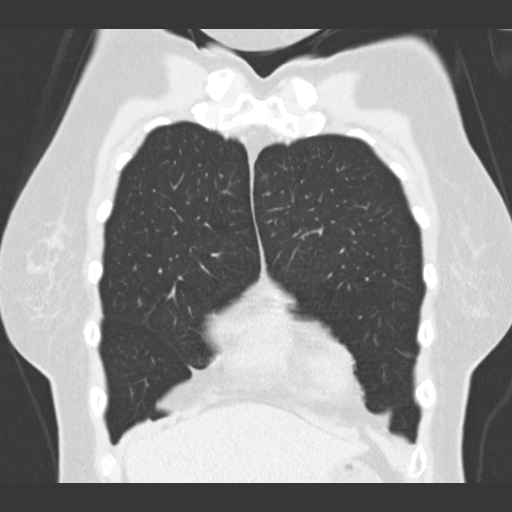
[im 43/108  lung]
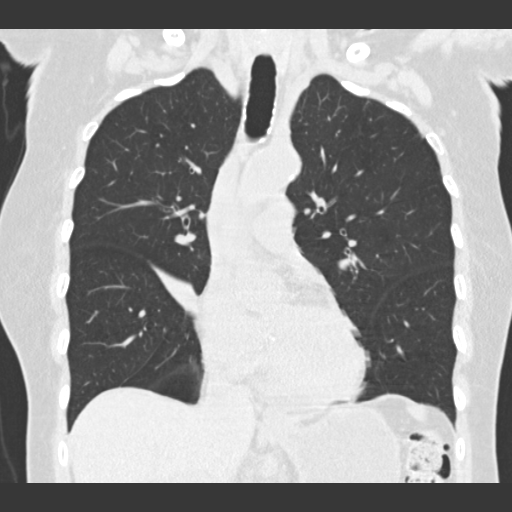
[im 65/108  lung]
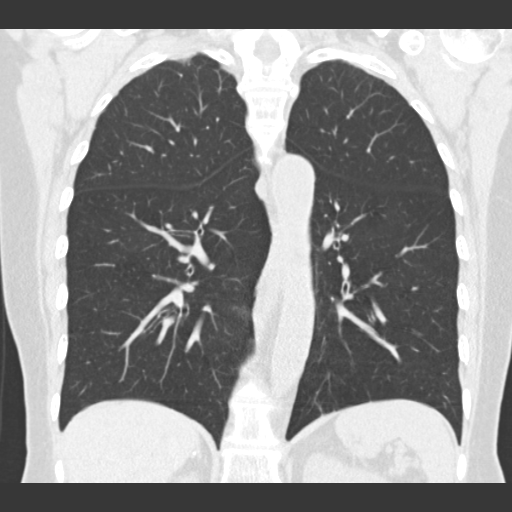

[13 of 36 positions shown; findings below may reference images not displayed]

FINDINGS: Mediastinum/Lymph Nodes: Heart size is normal. There is no
significant pericardial fluid, thickening or pericardial
calcification. There is atherosclerosis of the thoracic aorta, the
great vessels of the mediastinum and the coronary arteries,
including calcified atherosclerotic plaque in the left main, left
anterior descending and left circumflex coronary arteries. No
pathologically enlarged mediastinal or hilar lymph nodes. Please
note that accurate exclusion of hilar adenopathy is limited on
noncontrast CT scans. Esophagus is unremarkable in appearance. No
axillary lymphadenopathy.

Lungs/Pleura: High-resolution images demonstrate no areas of
significant ground-glass attenuation, subpleural reticulation,
parenchymal banding, traction bronchiectasis or frank honeycombing
to suggest interstitial lung disease. Inspiratory and expiratory
imaging demonstrates some very mild air trapping, indicative of mild
small airways disease. Areas of mild scarring in the periphery of
the right upper lobe, and in the periphery of the left lower lobe
are unchanged compared to prior study 06/09/2009. No acute
consolidative airspace disease. No pleural effusions. 3 mm nodule in
the superior segment of the right lower lobe (image 20 of series 5)
is unchanged compared to prior study from 06/09/2009, considered
benign. No other larger more suspicious appearing pulmonary nodules
or masses are otherwise noted. Chronic collapse of the right middle
lobe is unchanged.

Upper Abdomen: Dense calcifications in the right adrenal gland,
similar to prior examinations, presumably related to remote adrenal
hemorrhage or infection. Extensive atherosclerosis.

Musculoskeletal/Soft Tissues: There are no aggressive appearing
lytic or blastic lesions noted in the visualized portions of the
skeleton.
IMPRESSION: 1. No evidence of interstitial lung disease.
2. Chronic collapse of the right middle lobe is unchanged.
3. No acute findings in the thorax.
4. Atherosclerosis, including left main and 2 vessel coronary artery
disease.
5. Additional incidental findings, as above.

## 2016-05-02 ENCOUNTER — Ambulatory Visit: Payer: Medicare Other | Admitting: Internal Medicine

## 2016-06-02 ENCOUNTER — Other Ambulatory Visit: Payer: Self-pay | Admitting: Internal Medicine

## 2016-06-11 ENCOUNTER — Ambulatory Visit: Payer: Self-pay | Admitting: Podiatry

## 2016-06-25 ENCOUNTER — Other Ambulatory Visit: Payer: Self-pay | Admitting: Internal Medicine

## 2016-06-27 ENCOUNTER — Telehealth: Payer: Self-pay | Admitting: Internal Medicine

## 2016-06-27 NOTE — Telephone Encounter (Signed)
lmtcb x1 for pt. 

## 2016-06-27 NOTE — Telephone Encounter (Signed)
Looks like this was e-scribed to pharm on yesterday please verify.Hillery Hunter

## 2016-06-28 NOTE — Telephone Encounter (Signed)
Reviewed the pts chart and the rescue inhaler was sent in to the pts pharmacy.  Nothing further is needed. Called and lmom to make the pt aware.

## 2016-07-04 ENCOUNTER — Telehealth: Payer: Self-pay | Admitting: Internal Medicine

## 2016-07-04 MED ORDER — ALBUTEROL SULFATE HFA 108 (90 BASE) MCG/ACT IN AERS: 1.0000 | INHALATION_SPRAY | Freq: Four times a day (QID) | RESPIRATORY_TRACT | 3 refills | Status: DC | PRN

## 2016-07-04 NOTE — Telephone Encounter (Signed)
PA initiated via [email or web address] was denied for proventil HFA. proair HFA has been sent to her pharmacy.

## 2016-08-01 ENCOUNTER — Ambulatory Visit (INDEPENDENT_AMBULATORY_CARE_PROVIDER_SITE_OTHER): Payer: Medicare Other

## 2016-08-01 ENCOUNTER — Encounter: Payer: Self-pay | Admitting: Podiatry

## 2016-08-01 ENCOUNTER — Ambulatory Visit (INDEPENDENT_AMBULATORY_CARE_PROVIDER_SITE_OTHER): Payer: Medicare Other | Admitting: Podiatry

## 2016-08-01 DIAGNOSIS — M2040 Other hammer toe(s) (acquired), unspecified foot: Secondary | ICD-10-CM

## 2016-08-01 DIAGNOSIS — M79673 Pain in unspecified foot: Secondary | ICD-10-CM

## 2016-08-01 DIAGNOSIS — M2042 Other hammer toe(s) (acquired), left foot: Secondary | ICD-10-CM

## 2016-08-01 DIAGNOSIS — B351 Tinea unguium: Secondary | ICD-10-CM | POA: Diagnosis not present

## 2016-08-01 DIAGNOSIS — B359 Dermatophytosis, unspecified: Secondary | ICD-10-CM | POA: Diagnosis not present

## 2016-08-03 ENCOUNTER — Telehealth: Payer: Self-pay | Admitting: *Deleted

## 2016-08-03 NOTE — Telephone Encounter (Addendum)
Pt called for x-ray results, states they were not discussed at the visit on 08/01/2016.08/06/2016-I read Dr. Amalia Hailey radiology report to pt and explained in layman's terms to the best of my ability. Pt states she is still having pain in that 4th toe and would like to try the gabapentin.08/21/2016-Pt states she spoke with someone that was to ask Dr. Amalia Hailey about the fungal culture results and if he would set up a MRI. 08/22/2016-I informed pt that Dr. Amalia Hailey had said the MRI was not indicated at this time and her insurance would deny, it told pt the fungal culture results were available and she could discuss with Dr. Amalia Hailey at the follow up appt.

## 2016-08-03 NOTE — Progress Notes (Signed)
   Subjective: Patient presents today complaining of pain of the left second toe secondary to hammertoe digit syndrome present for the past 7 months. She reports associated burning in the left forefoot. She states she was seen by another podiatrist in March 2018 and was told that fungus was causing her pain. She reports having a total permanent nail avulsion of the left second toenail is still experiencing pain. Patient presents today for further treatment and evaluation.  Objective: Physical Exam General: The patient is alert and oriented x3 in no acute distress.  Dermatology: Hyperkeratotic, discolored, thickened, onychodystrophy of nails noted bilaterally.  Skin is warm, dry and supple bilateral lower extremities. Negative for open lesions or macerations.  Vascular: Palpable pedal pulses bilaterally. No edema or erythema noted. Capillary refill within normal limits.  Neurological: Epicritic and protective threshold grossly intact bilaterally.   Musculoskeletal Exam: Range of motion within normal limits to all pedal and ankle joints bilateral. Muscle strength 5/5 in all groups bilateral.   Radiographic exam: Hallux abductovalgus with bunion deformity and increased intermetatarsal angle approximately 15 noted to the bilateral feet. Hallux abductus angle noted approximately 30. There is some mild degenerative changes noted to the first MPJ. Radiographic evidence of hammertoe deformity digits 2-5 bilateral. Remaining joint spaces within the bilateral feet appear to be intact with minimal degenerative changes. No acute fracture noted. Normal osseous mineralization.  Assessment: #1 onychodystrophy bilateral toenails #2 possible onychomycosis #3 hyperkeratotic nails bilateral #4 history of total permanent nail avulsion left second toe  Plan of Care:  #1 Patient was evaluated. #2 Today nail biopsy was taken and sent to pathology for fungal culture. #3 patient is to return to clinic in 4  weeks    Edrick Kins, DPM [facility]  Dr. Edrick Kins, [address]                                        [place], [place] [address]                Office [phone]  Fax [phone]

## 2016-08-06 ENCOUNTER — Telehealth: Payer: Self-pay | Admitting: Podiatry

## 2016-08-06 NOTE — Telephone Encounter (Signed)
Pt would like to try the medication you suggested at her last visit.

## 2016-08-06 NOTE — Telephone Encounter (Signed)
Please prescribe Gabapentin 100mg  TID #90 with 2 refills.  Thanks, Dr. Amalia Hailey

## 2016-08-06 NOTE — Telephone Encounter (Signed)
It is now posted in her note. Below is the Xray results.  Thanks, Dr. Amalia Hailey  Radiographic Exam: Hallux abductovalgus with bunion deformity and increased intermetatarsal angle approximately 15 noted to the bilateral feet. Hallux abductus angle noted approximately 30. There is some mild degenerative changes noted to the first MPJ. Radiographic evidence of hammertoe deformity digits 2-5 bilateral. Remaining joint spaces within the bilateral feet appear to be intact with minimal degenerative changes. No acute fracture noted. Normal osseous mineralization.

## 2016-08-07 ENCOUNTER — Ambulatory Visit (INDEPENDENT_AMBULATORY_CARE_PROVIDER_SITE_OTHER): Payer: Medicare Other | Admitting: Internal Medicine

## 2016-08-07 ENCOUNTER — Encounter: Payer: Self-pay | Admitting: Internal Medicine

## 2016-08-07 VITALS — BP 120/78 | HR 79 | Ht 67.0 in | Wt 164.0 lb

## 2016-08-07 DIAGNOSIS — J4530 Mild persistent asthma, uncomplicated: Secondary | ICD-10-CM

## 2016-08-07 LAB — NITRIC OXIDE: NITRIC OXIDE: 17

## 2016-08-07 MED ORDER — FLUTICASONE FUROATE 100 MCG/ACT IN AEPB: 1.0000 | INHALATION_SPRAY | Freq: Every day | RESPIRATORY_TRACT | 11 refills | Status: DC

## 2016-08-07 MED ORDER — GABAPENTIN 100 MG PO CAPS: 100.0000 mg | ORAL_CAPSULE | Freq: Three times a day (TID) | ORAL | 2 refills | Status: DC

## 2016-08-07 NOTE — Patient Instructions (Signed)
ICD-9-CM ICD-10-CM   1. Mild persistent asthma without complication 937.16 R67.89    Glad you are doing well  Plan Stop advair Scale down to ARNUITY low dose once daily Use albuterol as needed Control sinus drainage with nasal inhalers  folllowup 6 months or sooner if needed; feno test at followup

## 2016-08-07 NOTE — Telephone Encounter (Signed)
I informed pt of Dr. Amalia Hailey 08/06/2016 5:48pm orders.

## 2016-08-07 NOTE — Addendum Note (Signed)
Addended by: Collier Salina on: 08/07/2016 11:54 AM   Modules accepted: Orders

## 2016-08-07 NOTE — Progress Notes (Signed)
Subjective:     Patient ID: Anne [name], female   DOB: July 04, 1930, 83 y.o.   MRN: [id number]  HPI    OV 12/23/2014  Chief Complaint  Patient presents with  . Follow-up    Pt here after the CT chest and myocardial perfusion study. Pt states her breathing is doing well. Pt states when she swallows she feels there is a bloackage.  Pt denies SOB, cough, and CP/tightness.    Follow-up dyspnea  - At last visit I had her do CT scan of the chest which did not show any change compared to the chronic changes she had several years ago. She also underwent nuclear medicine cardiac stress test in interim. I did a review the results of these are normal. In the interim her dyspnea has significantly improved. She is feeling better. She is on ICS/LABA Symbicort for few months. Prior to that she was on Advair for a few years. She is worried about the side effects of long-acting beta agonists and wants to reduce down to inhaled steroidal. She is not worried about side effects of ICS. The basis of her dyspnea is unknown. Asthma diagnosis was never established. The treatment with inhalers are completely empiric. I tried to rationalize about stopping inhalers altogether but she's not willing to undergo the strategy. However she only wants to give up long-acting beta agonist due to cardiac side effects. She will have a flu shot with her primary care physician next week   reports that she has never smoked. She has never used smokeless tobacco.   OV 03/07/2015  Chief Complaint  Patient presents with  . Follow-up    Pt states her breathing is unchanged since last OV. Pt is now taking Qvar and stopped Symbicort, pt states she has not noticed a difference in her breathing. Pt states her SOB is at baseline. Pt denies cough and CP/tightness.     Dyspnea follow up. Associated asthma diagnosis of presumptive one  Currently she reports she is better. She told my medical assistant that her dyspnea is baseline but when I  spoke to her she said that dyspnea is better. It is not noticeable much. She does not want to do pulmonary stress testing. She is now taking only Qvar. She is tolerating this fine. Dyspnea did not get worse when she scaled down from Symbicort to Qvar. We tried to do exhaled nitric oxide testing on her today but she could not follow through. At this point she is content continuing with Qvar    OV 10/18/2015  Chief Complaint  Patient presents with  . Follow-up    pt c/o sob with exertion, noted throat clearing in office.      Follow-up dyspnea in association with presumptive asthma. Last seen December 2016. At that time he gave her Qvar. Now on advair; did not like qvar. Overall she feels stable. She still is bothered by the are proportion dyspnea when she excercises. Last time she refused to have cardiopulmonary stress testing but this time she is inquiring about it and seems open to it.    OV 10/26/2015  Chief Complaint  Patient presents with  . Follow-up    review CPST.  Pt's breathing is unchanged since last visit.     [name] is here to review CPST done yesterday. Key findings of the pulmonary stress test was that she did an adequate test with a respiratory exchange ratio 1.22. Her VO2 max was 15.6 mL per kilogram ideal body weight  110% of predicted. Which is normal. Anaerobic threshold was 72% and again normal. There is no cardiac arrhythmias. Resting blood pressure was similar to her today 130s over 70. However she had a significantly hypertensive response to exercise with a systolic blood pressure 240 systolic at peak exercise. In association with this her stroke volume, was flat with the end of exercise. This is hypertensive response to exercise associated with diastolic dysfunction. She is not on any baseline antihypertensive drugs.  These results were shared with the patient.  OV 08/07/2016  Chief Complaint  Patient presents with  . Follow-up    Pt states her breathing is  unchanged since last OV. Pt denies cough, CP/tightness, f/c/s. Pt c/o seasonal allergies - rhinorrhea and runny eyes.     83 year old female for follow-up of dyspnea due to presumed asthma and also diastolic dysfunction  Last seen in summer 2017. After which I referred her to Dr. Verdis Prime in cardiology. I reviewed his chart and after trying different antihypertensive medications she is actually better. Currently she feels dyspnea significantly improved. Her last visit with Dr. Katrinka Blazing was in September 2017. She's been reassured. She tells me that other than current allergies bothering her sinuses and causing sneezing and postnasal drip she is doing well from a dyspnea standpoint. Currently she is not on any antihypertensives She has scaled her Advair down to 1 time per day. She wants to know if she can scaled is down even further.  Last cxr dec 2016: hyperinflation  Exhaled nitric oxide test today in the office May 2018: 17ppb and normal   has a past medical history of Allergic rhinitis; Arthritis; Asthma; Atypical chest pain; Collapse of right lung; Dyslipidemia; Gastroesophageal reflux disease; Glaucoma; Hematuria; Hyperlipidemia; and Lung nodules.   reports that she has never smoked. She has never used smokeless tobacco.  Past Surgical History:  Procedure Laterality Date  . ANAL FISSURE REPAIR    . CATARACT EXTRACTION Bilateral   . MASS EXCISION  04/11/2012   Procedure: EXCISION MASS;  Surgeon: Nicki Reaper, MD;  Location: [facility];  Service: Orthopedics;  Laterality: Right;  Excision of mass right upper arm  . MEDIASTINOSCOPY  4/11  . NASAL SINUS SURGERY    . TOTAL ABDOMINAL HYSTERECTOMY W/ BILATERAL SALPINGOOPHORECTOMY      Allergies  Allergen Reactions  . Shellfish Allergy Shortness Of Breath  . Iodine Other (See Comments)    REACTION: " UNSURE TOLD TO LIST AS ALLERGEN BY MD"  . Latex Itching  . Adhesive [Tape] Rash    Immunization History  Administered  Date(s) Administered  . Influenza Whole 04/04/2009  . Influenza,inj,Quad PF,36+ Mos 01/03/2015, 12/02/2015  . Influenza-Unspecified 02/14/2014  . Pneumococcal Polysaccharide-23 06/01/2003  . Pneumococcal-Unspecified 01/03/2015    Family History  Problem Relation Age of Onset  . Cancer Mother   . Heart disease Father   . Heart attack Neg Hx      Current Outpatient Prescriptions:  .  ADVAIR DISKUS 100-50 MCG/DOSE AEPB, INHALE 1 PUFF 2 TIMES DAILY, Disp: 60 each, Rfl: 3 .  albuterol (PROAIR HFA) 108 (90 Base) MCG/ACT inhaler, Inhale 1-2 puffs into the lungs every 6 (six) hours as needed for wheezing or shortness of breath., Disp: 1 Inhaler, Rfl: 3 .  aspirin EC 81 MG tablet, Take 1 tablet (81 mg total) by mouth daily., Disp: 90 tablet, Rfl: 3 .  Calcium Carb-Cholecalciferol (CALCIUM 600 + D) 600-200 MG-UNIT TABS, Take 2 tablets by mouth daily. , Disp: , Rfl:  .  cetirizine (ZYRTEC) 10 MG tablet, Take 10 mg by mouth every morning., Disp: , Rfl:  .  fluticasone (FLONASE) 50 MCG/ACT nasal spray, Place 1 spray into both nostrils 2 (two) times daily as needed for allergies. , Disp: , Rfl:  .  latanoprost (XALATAN) 0.005 % ophthalmic solution, Place 1 drop into both eyes at bedtime., Disp: , Rfl:  .  Multiple Vitamin (MULTIVITAMIN) tablet, Take 1 tablet by mouth every morning. , Disp: , Rfl:  .  pantoprazole (PROTONIX) 40 MG tablet, Take 40 mg by mouth daily as needed (REFLUX). , Disp: , Rfl:  .  TURMERIC PO, Take 1 capsule by mouth daily., Disp: , Rfl:  .  gabapentin (NEURONTIN) 100 MG capsule, Take 1 capsule (100 mg total) by mouth 3 (three) times daily. (Patient not taking: Reported on 08/07/2016), Disp: 90 capsule, Rfl: 2 No current facility-administered medications for this visit.   Facility-Administered Medications Ordered in Other Visits:  .  regadenoson (LEXISCAN) injection SOLN 0.4 mg, 0.4 mg, Intravenous, Once, Turner, Cornelious Bryant, MD    Review of Systems     Objective:   Physical  Exam  Constitutional: She is oriented to person, place, and time. She appears well-developed and well-nourished. No distress.  HENT:  Head: Normocephalic and atraumatic.  Right Ear: External ear normal.  Left Ear: External ear normal.  Mouth/Throat: Oropharynx is clear and moist. No oropharyngeal exudate.  Postnasal drip present  Eyes: Conjunctivae and EOM are normal. Pupils are equal, round, and reactive to light. Right eye exhibits no discharge. Left eye exhibits no discharge. No scleral icterus.  Neck: Normal range of motion. Neck supple. No JVD present. No tracheal deviation present. No thyromegaly present.  Cardiovascular: Normal rate, regular rhythm, normal heart sounds and intact distal pulses.  Exam reveals no gallop and no friction rub.   No murmur heard. Pulmonary/Chest: Effort normal and breath sounds normal. No respiratory distress. She has no wheezes. She has no rales. She exhibits no tenderness.  Abdominal: Soft. Bowel sounds are normal. She exhibits no distension and no mass. There is no tenderness. There is no rebound and no guarding.  Musculoskeletal: Normal range of motion. She exhibits no edema or tenderness.  Lymphadenopathy:    She has no cervical adenopathy.  Neurological: She is alert and oriented to person, place, and time. She has normal reflexes. No cranial nerve deficit. She exhibits normal muscle tone. Coordination normal.  Skin: Skin is warm and dry. No rash noted. She is not diaphoretic. No erythema. No pallor.  Psychiatric: She has a normal mood and affect. Her behavior is normal. Judgment and thought content normal.  Vitals reviewed.   Vitals:   08/07/16 1124  BP: 120/78  Pulse: 79  SpO2: 98%  Weight: 164 lb (74.4 kg)  Height: 5\' 7"  (1.702 m)    Estimated body mass index is 25.69 kg/m as calculated from the following:   Height as of this encounter: 5\' 7"  (1.702 m).   Weight as of this encounter: 164 lb (74.4 kg).      Assessment:       ICD-9-CM  ICD-10-CM   1. Mild persistent asthma without complication 493.90 J45.30    Well cotnrollled presumed asthma with feno 17ppb on advair once daily     Plan:      Glad you are doing well  Plan Stop advair Scale down to ARNUITY low dose once daily Use albuterol as needed Control sinus drainage with nasal inhalers  folllowup 6 months or sooner if  needed; feno test at followup   Dr. Kalman Shan, M.D., [facility].C.P Pulmonary and Critical Care Medicine Staff Physician [facility] Pager: [phone], If no answer or between  15:00h - 7:00h: call [phone]  08/07/2016 11:48 AM

## 2016-08-07 NOTE — Addendum Note (Signed)
Addended by: Harriett Sine D on: 08/07/2016 10:09 AM   Modules accepted: Orders

## 2016-08-07 NOTE — Addendum Note (Signed)
Addended by: Collier Salina on: 08/07/2016 12:02 PM   Modules accepted: Orders

## 2016-08-08 ENCOUNTER — Other Ambulatory Visit: Payer: Self-pay | Admitting: Obstetrics and Gynecology

## 2016-08-08 DIAGNOSIS — Z1231 Encounter for screening mammogram for malignant neoplasm of breast: Secondary | ICD-10-CM

## 2016-08-20 ENCOUNTER — Telehealth: Payer: Self-pay | Admitting: Podiatry

## 2016-08-20 NOTE — Telephone Encounter (Signed)
Called wanting to know if we have received lab results back yet from culture sent out about three weeks ago. Also still having problems with 2nd left toe. Wants to know if she can get an MRI scheduled for the toe. States it is still not completley well. Stated a message can be left on home answering machine if there is no answer.

## 2016-08-20 NOTE — Telephone Encounter (Signed)
MRI not indicated at the moment, will not show anything and insurance will deny it.  Have her come in for follow-up appt. Dr. Amalia Hailey

## 2016-08-21 NOTE — Telephone Encounter (Signed)
Dawn-will you schedule her an appt to be evaluated? Thanks!

## 2016-08-29 ENCOUNTER — Encounter: Payer: Self-pay | Admitting: Podiatry

## 2016-08-29 ENCOUNTER — Ambulatory Visit (INDEPENDENT_AMBULATORY_CARE_PROVIDER_SITE_OTHER): Payer: Medicare Other | Admitting: Podiatry

## 2016-08-29 DIAGNOSIS — M2041 Other hammer toe(s) (acquired), right foot: Secondary | ICD-10-CM | POA: Diagnosis not present

## 2016-08-29 DIAGNOSIS — M2042 Other hammer toe(s) (acquired), left foot: Secondary | ICD-10-CM

## 2016-08-30 NOTE — Progress Notes (Signed)
   Subjective: Patient presents today for follow up evaluation of pain of the left second toe secondary to hammertoe digit syndrome. She reports associated swelling and redness to the distal tip of the toe. She states the pain is intermittent.  Objective: Physical Exam General: The patient is alert and oriented x3 in no acute distress.  Dermatology: Hyperkeratotic, discolored, thickened, onychodystrophy of nails noted bilaterally.  Skin is warm, dry and supple bilateral lower extremities. Negative for open lesions or macerations.  Vascular: Palpable pedal pulses bilaterally. No edema or erythema noted. Capillary refill within normal limits.  Neurological: Epicritic and protective threshold grossly intact bilaterally.   Musculoskeletal Exam: Range of motion within normal limits to all pedal and ankle joints bilateral. Muscle strength 5/5 in all groups bilateral.    Assessment: #1 hammertoes digits 5 bilaterally  Plan of Care:  #1 Patient was evaluated. #2 recommended good shoe gear. #3 silicone toe caps dispensed. #4 return to clinic when necessary.   Edrick Kins, DPM [facility]  Dr. Edrick Kins, [address]                                        [place], [place] [address]                Office [phone]  Fax [phone]

## 2016-09-12 ENCOUNTER — Ambulatory Visit
Admission: RE | Admit: 2016-09-12 | Discharge: 2016-09-12 | Disposition: A | Discharge status: Home / Self Care | Payer: Medicare Other | Source: Ambulatory Visit | Attending: Obstetrics and Gynecology | Admitting: Obstetrics and Gynecology

## 2016-09-12 DIAGNOSIS — Z1231 Encounter for screening mammogram for malignant neoplasm of breast: Secondary | ICD-10-CM

## 2016-09-13 ENCOUNTER — Other Ambulatory Visit: Payer: Self-pay | Admitting: Obstetrics and Gynecology

## 2016-09-13 DIAGNOSIS — R928 Other abnormal and inconclusive findings on diagnostic imaging of breast: Secondary | ICD-10-CM

## 2016-09-18 ENCOUNTER — Other Ambulatory Visit: Payer: Medicare Other

## 2016-09-18 ENCOUNTER — Other Ambulatory Visit: Payer: Self-pay | Admitting: Geriatric Medicine

## 2016-09-18 DIAGNOSIS — R928 Other abnormal and inconclusive findings on diagnostic imaging of breast: Secondary | ICD-10-CM

## 2016-09-19 ENCOUNTER — Ambulatory Visit
Admission: RE | Admit: 2016-09-19 | Discharge: 2016-09-19 | Disposition: A | Discharge status: Home / Self Care | Payer: Medicare Other | Source: Ambulatory Visit | Attending: Geriatric Medicine | Admitting: Geriatric Medicine

## 2016-09-19 DIAGNOSIS — R928 Other abnormal and inconclusive findings on diagnostic imaging of breast: Secondary | ICD-10-CM

## 2016-10-10 DIAGNOSIS — J019 Acute sinusitis, unspecified: Secondary | ICD-10-CM

## 2016-10-10 DIAGNOSIS — B9689 Other specified bacterial agents as the cause of diseases classified elsewhere: Secondary | ICD-10-CM | POA: Insufficient documentation

## 2016-11-27 ENCOUNTER — Ambulatory Visit (INDEPENDENT_AMBULATORY_CARE_PROVIDER_SITE_OTHER): Payer: Medicare Other | Admitting: Podiatry

## 2016-11-27 ENCOUNTER — Encounter: Payer: Self-pay | Admitting: Podiatry

## 2016-11-27 VITALS — BP 133/67 | HR 72 | Temp 97.4°F

## 2016-11-27 DIAGNOSIS — M2042 Other hammer toe(s) (acquired), left foot: Secondary | ICD-10-CM

## 2016-11-27 DIAGNOSIS — M2041 Other hammer toe(s) (acquired), right foot: Secondary | ICD-10-CM

## 2016-11-27 NOTE — Progress Notes (Signed)
Subjective:    Patient ID: Anne [name], female    DOB: November 14, 1930, 82 y.o.   MRN: [id number]  HPI This patient presents again complaining of intermittent pain in the second left toe over the past 6 months. She says of time the pain is very intense and she has to stop her reduce her standing walking because of. She describes wearing a sleeve over the toe which provided slight occasional related reduction of discomfort. Also she describes some debridement or nail reduction which did not provide any relief.   Review of Systems  All other systems reviewed and are negative.      Objective:   Physical Exam  Orientated 3  Vascular: No calf edema or calf tenderness bilaterally DP right 1/4 DP left 2/4 PT pulses 2/4 bilaterally Reflexes normal limits bilaterally  Neurological: Sensation to 10 g monofilament wire intact 5/5 bilaterally Vibratory sensation reactive bilaterally Ankle reflexes reactive bilaterally  Dermatological: No open skin lesions bilaterally Atrophic skin with absent hair growth bilaterally  Musculoskeletal: HAV bilaterally Hammertoe second left with semirigid PIPJ. The PIPJ is tender to direct palpation which seems to do a patient's discomfort. There is low-grade edema on the distal interphalangeal joint second left toe  X-rays of 08/01/2016 confirm hammertoe deformity second left      Assessment & Plan:   Assessment: Symptomatic hammertoe deformity second toe left foot  Plan: Discussed treatment options including surgery of care. Described soft shoes or open toe shoes. Also dispensed toe prop to elevate toes 2-4 left. I also discussed treatment options with patient, hammertoe reduction if the symptoms persist and conservative care does not relieve her symptoms. Patient will wear toe prop and will consider surgical correction if symptoms do not improve  Reappoint at patient's request

## 2016-11-27 NOTE — Patient Instructions (Signed)
Today we discussed treatment for hammer toes. The toe she'll did not provide much relief I dispensed a toe prop to attempt to take pressure off the tip of the second toe and left foot If this does not provide adequate relief consider surgical correction We discussed the possibility of Dr. Roselind Messier performing this procedure before you  Hammer Toe Hammer toe is a change in the shape (a deformity) of your second, third, or fourth toe. The deformity causes the middle joint of your toe to stay bent. This causes pain, especially when you are wearing shoes. Hammer toe starts gradually. At first, the toe can be straightened. Gradually over time, the deformity becomes stiff and permanent. Early treatments to keep the toe straight may relieve pain. As the deformity becomes stiff and permanent, surgery may be needed to straighten the toe. What are the causes? Hammer toe is caused by abnormal bending of the toe joint that is closest to your foot. It happens gradually over time. This pulls on the muscles and connections (tendons) of the toe joint, making them weak and stiff. It is often related to wearing shoes that are too short or narrow and do not let your toes straighten. What increases the risk? You may be at greater risk for hammer toe if you:  Are female.  Are older.  Wear shoes that are too small.  Wear high-heeled shoes that pinch your toes.  Are a Engineer, mining.  Have a second toe that is longer than your big toe (first toe).  Injure your foot or toe.  Have arthritis.  Have a family history of hammer toe.  Have a nerve or muscle disorder.  What are the signs or symptoms? The main symptoms of this condition are pain and deformity of the toe. The pain is worse when wearing shoes, walking, or running. Other symptoms may include:  Corns or calluses over the bent part of the toe or between the toes.  Redness and a burning feeling on the toe.  An open sore that forms on the top of  the toe.  Not being able to straighten the toe.  How is this diagnosed? This condition is diagnosed based on your symptoms and a physical exam. During the exam, your health care provider will try to straighten your toe to see how stiff the deformity is. You may also have tests, such as:  A blood test to check for rheumatoid arthritis.  An X-ray to show how severe the deformity is.  How is this treated? Treatment for this condition will depend on how stiff the deformity is. Surgery is often needed. However, sometimes a hammer toe can be straightened without surgery. Treatments that do not involve surgery include:  Taping the toe into a straightened position.  Using pads and cushions to protect the toe (orthotics).  Wearing shoes that provide enough room for the toes.  Doing toe-stretching exercises at home.  Taking an NSAID to reduce pain and swelling.  If these treatments do not help or the toe cannot be straightened, surgery is the next option. The most common surgeries used to straighten a hammer toe include:  Arthroplasty. In this procedure, part of the joint is removed, and that allows the toe to straighten.  Fusion. In this procedure, cartilage between the two bones of the joint is taken out and the bones are fused together into one longer bone.  Implantation. In this procedure, part of the bone is removed and replaced with an implant to let  the toe move again.  Flexor tendon transfer. In this procedure, the tendons that curl the toes down (flexor tendons) are repositioned.  Follow these instructions at home:  Take over-the-counter and prescription medicines only as told by your health care provider.  Do toe straightening and stretching exercises as told by your health care provider.  Keep all follow-up visits as told by your health care provider. This is important. How is this prevented?  Wear shoes that give your toes enough room and do not cause pain.  Do not wear  high-heeled shoes. Contact a health care provider if:  Your pain gets worse.  Your toe becomes red or swollen.  You develop an open sore on your toe. This information is not intended to replace advice given to you by your health care provider. Make sure you discuss any questions you have with your health care provider. Document Released: 03/16/2000 Document Revised: 10/07/2015 Document Reviewed: 07/13/2015 Elsevier Interactive Patient Education  [employer].

## 2016-12-10 ENCOUNTER — Ambulatory Visit: Payer: Medicare Other | Admitting: Podiatry

## 2016-12-20 ENCOUNTER — Ambulatory Visit: Payer: Medicare Other | Admitting: Podiatry

## 2016-12-25 ENCOUNTER — Ambulatory Visit (INDEPENDENT_AMBULATORY_CARE_PROVIDER_SITE_OTHER): Payer: Medicare Other | Admitting: Podiatry

## 2016-12-25 ENCOUNTER — Encounter: Payer: Self-pay | Admitting: Podiatry

## 2016-12-25 DIAGNOSIS — M2042 Other hammer toe(s) (acquired), left foot: Secondary | ICD-10-CM | POA: Diagnosis not present

## 2016-12-26 ENCOUNTER — Telehealth: Payer: Self-pay | Admitting: *Deleted

## 2016-12-26 NOTE — Telephone Encounter (Signed)
"  I'm calling about my surgery with Dr. Milinda Pointer.  I have some questions about what he's going to do.  I don't want to have this surgery that Dr. Amalia Hailey had written down for me.  I want to hold off on that."  You will need to schedule an appointment to see Dr. Milinda Pointer.  He wants to do a procedure a minor procedure in the room on you called a Tenotomy, which is cutting of your tendon in your toe to help straighten it out.  If this doesn't do well, he will give you the option to schedule surgery where he will remove the bone out of your toe and put a screw in it to hold it in place.  "Okay, I understand.  So I am just getting tendon cut on Thursday of next week?"  That's correct.

## 2016-12-26 NOTE — Progress Notes (Signed)
She presents today complaining of painful hammertoe second digit of the left foot. She's been seen multiple times by multiple biters to have stated that he was in the ingrown toenail or the toe becoming arthritic itself is causing the pain.  Objective: Vital signs are stable alert and oriented 3. Pulses are palpable. She has mild hallux abductovalgus deformity resulting in juxtaposition tenderness to the second toe of the left foot. This hammertoe was resulted in pain because of its position being rubbed by the hallux. The pain of the second toe is not just on the distal aspect but rather the distal medial aspect with the 2 toes touch. The nail procedure has gone on to heal uneventfully and there is no recourse there. At this point I feel that because of the second toe the way he sits I feel that is more than likely irritated by the mild hallux valgus.  Assessment: Hallux valgus flexible hammertoe deformity second left.  Plan: I discussed with her in great detail today a simple procedure that would form in the office called a tenotomy. We can perform a flexor and extensor tenotomy to the second toe which will allow the toe to lay down and become longer and if this happens the second toe can no longer be irritated by the hallux in the same area. I demonstrated to her by pulling her toe down flap. She states she understands this is amenable to elective try it. I explained to her that this was a simple way initially to go however if this failed and we can consider a correction with screw to the second toe with arthrodesis as well as a bunion repair. She understands this and is minimal to it will follow-up with me in the next week or so for extensor flexor tenotomy second digit of the left foot.

## 2016-12-31 ENCOUNTER — Ambulatory Visit (INDEPENDENT_AMBULATORY_CARE_PROVIDER_SITE_OTHER)
Admission: RE | Admit: 2016-12-31 | Discharge: 2016-12-31 | Disposition: A | Discharge status: Home / Self Care | Payer: Medicare Other | Source: Ambulatory Visit | Attending: Acute Care | Admitting: Acute Care

## 2016-12-31 ENCOUNTER — Encounter: Payer: Self-pay | Admitting: Acute Care

## 2016-12-31 ENCOUNTER — Ambulatory Visit (INDEPENDENT_AMBULATORY_CARE_PROVIDER_SITE_OTHER): Payer: Medicare Other | Admitting: Acute Care

## 2016-12-31 VITALS — BP 124/44 | HR 75 | Ht 67.0 in | Wt 160.2 lb

## 2016-12-31 DIAGNOSIS — R0600 Dyspnea, unspecified: Secondary | ICD-10-CM

## 2016-12-31 DIAGNOSIS — Z23 Encounter for immunization: Secondary | ICD-10-CM | POA: Diagnosis not present

## 2016-12-31 DIAGNOSIS — J449 Chronic obstructive pulmonary disease, unspecified: Secondary | ICD-10-CM

## 2016-12-31 DIAGNOSIS — J45909 Unspecified asthma, uncomplicated: Secondary | ICD-10-CM

## 2016-12-31 DIAGNOSIS — R0689 Other abnormalities of breathing: Secondary | ICD-10-CM | POA: Diagnosis not present

## 2016-12-31 MED ORDER — ALBUTEROL SULFATE (2.5 MG/3ML) 0.083% IN NEBU: 2.5000 mg | INHALATION_SOLUTION | Freq: Four times a day (QID) | RESPIRATORY_TRACT | 12 refills | Status: DC | PRN

## 2016-12-31 MED ORDER — LEVALBUTEROL HCL 0.63 MG/3ML IN NEBU
0.6300 mg | INHALATION_SOLUTION | Freq: Once | RESPIRATORY_TRACT | Status: AC
  Administered 2016-12-31: 0.63 mg via RESPIRATORY_TRACT

## 2016-12-31 MED ORDER — FLUTICASONE FUROATE-VILANTEROL 100-25 MCG/INH IN AEPB: 1.0000 | INHALATION_SPRAY | Freq: Every day | RESPIRATORY_TRACT | 3 refills | Status: DC

## 2016-12-31 NOTE — Patient Instructions (Addendum)
It is nice to meet you today. We will do a CXR today ( Please place order as a stat) We will call you with results. We will try a therapeutic trial of Breo 100. Use once daily instead of your Arnuity. This is a maintenance medication and should be used every day without fail. Remember to rinse mouth after use. If you like the [facility], I will give you a prescription to fill. Xopenex breathing treatment now.  Continue using your rescue inhaler for breakthrough shortness of breath. Flu shot today. We will order albuterol nebs for you. These are for breakthrough shortness of breath up to 4 times daily as needed. Follow up with Dr. Chase Caller November 13th.as is scheduled Please contact office for sooner follow up if symptoms do not improve or worsen or seek emergency care

## 2016-12-31 NOTE — Progress Notes (Signed)
History of Present Illness Anne [name] is a 83 y.o. 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 She is followed by Dr. Chase [name].   12/31/2016 Acute OV: Pt. presents today for 1 week of congestion . She was seen at an outside urgent care 12/24/2016 for shortness of breath. She was treated at that time with a breathing treatment and what she suspects was a depo medrol injection. They also checked an EKG which she told me was normal. She says she is a bit better, but not well. She states she is not coughing anything up. She has increased sinus drainage, some yellow secretions from her nose. She states she has not been wheezing.She denies any fevers.She is compliant with her Zyrtec, Flonase and protonix daily.She states her shortness of breath is worse with exertion.She has been using her rescue inhaler 4 x daily.She stated she was told not to take her maintenance inhaler when she is using her rescue inhaler by the urgent care MD.She has not used her maintenance inhaler for a week. We discussed that maintenance inhalers were to be used every day without fail. Increased need for rescue inhaler use is a red flag to come to the doctor. She was switched to Arnuity from [place]  last year by Dr. Chase [name] due to a period of stability. I suspect she needs the LABA component which she is not getting from her Arnuity on a regular basis. She is not compliant with blood pressure medications. She does not feel she needs them. She denies fever, chest pain, orthopnea or hemoptysis.     Test Results: 12/31/2016>> CXR: Mild hyperinflation consistent with known reactive airway disease. No pneumonia, CHF, nor other acute cardiopulmonary abnormality.  10/24/2016>> CPST: Exercise testing with gas exchange demonstrates excellent functional capacity when compared to matched sedentary norms. Pre-exercise spirometry indicates obstructive lung patterns which is  consistent with her previous existing asthma diagnosis. Although PVO2 healthy and above normal, patient had a significant hypertensive response to exercise which may be cause of dyspnea. Patient is appears limited due to significant diastolic dysfunction.    10/28/2014>> PFT's  Mild to moderate obstructive disease- Emphysematous type ( See specific values below)    CBC Latest Ref Rng & Units 03/17/2015 10/09/2014 09/05/2014  WBC 4.0 - 10.5 K/uL 8.3 5.7 6.6  Hemoglobin 12.0 - 15.0 g/dL 11.5(L) 11.5(L) 11.0(L)  Hematocrit 36.0 - 46.0 % 35.3(L) 35.5(L) 33.6(L)  Platelets 150 - 400 K/uL 283 307 263    BMP Latest Ref Rng & Units 03/17/2015 10/09/2014 09/05/2014  Glucose 65 - 99 mg/dL 121(H) 81 82  BUN 6 - 20 mg/dL 15 13 18   Creatinine 0.44 - 1.00 mg/dL 0.98 0.87 0.97  Sodium 135 - 145 mmol/L 140 138 139  Potassium 3.5 - 5.1 mmol/L 3.9 3.9 3.9  Chloride 101 - 111 mmol/L 105 107 106  CO2 22 - 32 mmol/L 27 25 24   Calcium 8.9 - 10.3 mg/dL 9.0 9.0 8.7(L)    BNP    Component Value Date/Time   BNP 103.9 (H) 03/17/2015 1120     PFT    Component Value Date/Time   FEV1PRE 1.61 10/28/2014 1013   FEV1POST 1.68 10/28/2014 1013   FVCPRE 2.83 10/28/2014 1013   FVCPOST 3.13 10/28/2014 1013   TLC 6.74 10/28/2014 1013   DLCOUNC 18.63 10/28/2014 1013   PREFEV1FVCRT 57 10/28/2014 1013   PSTFEV1FVCRT 54 10/28/2014 1013      Past medical hx Past Medical History:  Diagnosis Date  . Allergic rhinitis   . Arthritis   . Asthma   . Atypical chest pain   . Collapse of right lung   . Dyslipidemia   . Gastroesophageal reflux disease   . Glaucoma   . Hematuria   . Hyperlipidemia   . Lung nodules      Social History  Substance Use Topics  . Smoking status: Never Smoker  . Smokeless tobacco: Never Used  . Alcohol use No    [name] reports that she has never smoked. She has never used smokeless tobacco. She reports that she does not drink alcohol or use drugs.  Tobacco Cessation: Never  smoker  Past surgical hx, Family hx, Social hx all reviewed.  Current Outpatient Prescriptions on File Prior to Visit  Medication Sig  . ADVAIR DISKUS 100-50 MCG/DOSE AEPB INHALE 1 PUFF 2 TIMES DAILY  . albuterol (PROAIR HFA) 108 (90 Base) MCG/ACT inhaler Inhale 1-2 puffs into the lungs every 6 (six) hours as needed for wheezing or shortness of breath.  Marland Kitchen aspirin EC 81 MG tablet Take 1 tablet (81 mg total) by mouth daily.  . Calcium Carb-Cholecalciferol (CALCIUM 600 + D) 600-200 MG-UNIT TABS Take 2 tablets by mouth daily.   . cetirizine (ZYRTEC) 10 MG tablet Take 10 mg by mouth every morning.  . fluticasone (FLONASE) 50 MCG/ACT nasal spray Place 1 spray into both nostrils 2 (two) times daily as needed for allergies.   . Fluticasone Furoate (ARNUITY ELLIPTA) 100 MCG/ACT AEPB Inhale 1 puff into the lungs daily.  Marland Kitchen latanoprost (XALATAN) 0.005 % ophthalmic solution Place 1 drop into both eyes at bedtime.  . Multiple Vitamin (MULTIVITAMIN) tablet Take 1 tablet by mouth every morning.   . TURMERIC PO Take 1 capsule by mouth daily.   Current Facility-Administered Medications on File Prior to Visit  Medication  . regadenoson (LEXISCAN) injection SOLN 0.4 mg     Allergies  Allergen Reactions  . Shellfish Allergy Shortness Of Breath  . Iodine Other (See Comments)    REACTION: " UNSURE TOLD TO LIST AS ALLERGEN BY MD" REACTION: " UNSURE TOLD TO LIST AS ALLERGEN BY MD"  . Latex Itching  . Adhesive [Tape] Rash    Review Of Systems:  Constitutional:   No  weight loss, night sweats,  Fevers, chills, fatigue, or  lassitude.  HEENT:   No headaches,  Difficulty swallowing,  Tooth/dental problems, or  Sore throat,                No sneezing, itching, ear ache, nasal congestion, post nasal drip,   CV:  No chest pain,  Orthopnea, PND, swelling in lower extremities, anasarca, dizziness, palpitations, syncope.   GI  No heartburn, indigestion, abdominal pain, nausea, vomiting, diarrhea, change in  bowel habits, loss of appetite, bloody stools.   Resp: + shortness of breath with exertion less at rest.  No excess mucus, no productive cough,  No non-productive cough,  No coughing up of blood.  No change in color of mucus.  + wheezing.  No chest wall deformity  Skin: no rash or lesions.  GU: no dysuria, change in color of urine, no urgency or frequency.  No flank pain, no hematuria   MS:  No joint pain or swelling.  No decreased range of motion.  No back pain.  Psych:  No change in mood or affect. No depression or anxiety.  No memory loss.   Vital Signs BP (!) 124/44 (BP Location: Left Arm, Cuff Size:  Normal)   Pulse 75   Ht 5\' 7"  (1.702 m)   Wt 160 lb 3.2 oz (72.7 kg)   SpO2 91%   BMI 25.09 kg/m    Physical Exam:  General- No distress,  A&Ox3, very pleasant ENT: No sinus tenderness, TM clear, pale nasal mucosa, no oral exudate,no post nasal drip, no LAN Cardiac: S1, S2, regular rate and rhythm, no murmur Chest: + wheeze/ No rales/ dullness; no accessory muscle use, no nasal flaring, no sternal retractions Abd.: Soft Non-tender, non-distended, BS+ Ext: No clubbing cyanosis, edema Neuro:  normal strength, cranial nerves intact, alert and appropriate Skin: No rashes, warm and dry Psych: normal mood and behavior   Assessment/Plan  Asthma Flare Was seen by urgent care 12/24/2016  and told to stop maintenance inhaler and only use rescue 4 x day. She was treated with depo medrol and breathing treatment Plan: We will do a CXR today ( Please place order as a stat) We will call you with results. We will try a therapeutic trial of Breo 100. Use once daily instead of your Arnuity. This is a maintenance medication and should be used every day without fail. Remember to rinse mouth after use. If you like the [facility], I will give you a prescription to fill. Xopenex breathing treatment now.  Continue using your rescue inhaler for breakthrough shortness of breath. Flu shot  today. We will order albuterol nebs for you. These are for breakthrough shortness of breath up to 4 times daily as needed. This would be used as an alternate to your rescue inhaler Follow up with Dr. Chase [name] November 13th.as is scheduled Please contact office for sooner follow up if symptoms do not improve or worsen or seek emergency care         Magdalen Spatz, NP 12/31/2016  2:05 PM

## 2016-12-31 NOTE — Assessment & Plan Note (Addendum)
Flare Was seen by urgent care 12/24/2016  and told to stop maintenance inhaler and only use rescue 4 x day. She was treated with depo medrol and breathing treatment She was switched to Arnuity from [place]  last year,  But I suspect needs the LABA component as part of her maintenance.  Plan: We will do a CXR today ( Please place order as a stat) We will call you with results. We will try a therapeutic trial of Breo 100. Use once daily instead of your Arnuity. This is a maintenance medication and should be used every day without fail. Remember to rinse mouth after use. If you like the [facility], I will give you a prescription to fill. Xopenex breathing treatment now.  Continue using your rescue inhaler for breakthrough shortness of breath. Flu shot today. We will order albuterol nebs for you. These are for breakthrough shortness of breath up to 4 times daily as needed. This would be used as an alternate to your rescue inhaler Follow up with Dr. Chase Caller November 13th.as is scheduled Please contact office for sooner follow up if symptoms do not improve or worsen or seek emergency care

## 2017-01-01 ENCOUNTER — Telehealth: Payer: Self-pay | Admitting: Acute Care

## 2017-01-01 NOTE — Telephone Encounter (Signed)
Notes recorded by Magdalen Spatz, NP on 12/31/2016 at 3:11 PM EDT Please call patient and let her know her CXR did not show any acute process. No pneumonia. Have her follow the plan of care developed in the office today.Thanks so much.  Advised pt of results. Pt understood and nothing further is needed.

## 2017-01-02 ENCOUNTER — Telehealth: Payer: Self-pay | Admitting: Acute Care

## 2017-01-02 ENCOUNTER — Telehealth: Payer: Self-pay | Admitting: *Deleted

## 2017-01-02 ENCOUNTER — Telehealth: Payer: Self-pay | Admitting: Podiatry

## 2017-01-02 MED ORDER — PREDNISONE 10 MG PO TABS: ORAL_TABLET | ORAL | 0 refills | Status: DC

## 2017-01-02 NOTE — Telephone Encounter (Signed)
I have an appointment with Dr. Milinda Pointer tomorrow at 9:45 am. I am wanting to know if he is going to do the tenotomy procedure on my 4th toe or if its just for a consultation. If you could please call me back and let me know so I know if I need transportation. Please call me back at [phone]. Thank you.

## 2017-01-02 NOTE — Telephone Encounter (Signed)
"  I have an appointment with Dr. Milinda Pointer tomorrow at 9:45 am.  I want to find out if I'm correct.  Am I supposed to have surgery on my toe tomorrow or will it be just a consultation.  I need to know because I will need to make arrangements if the procedure will be tomorrow.  Please call me back."

## 2017-01-02 NOTE — Telephone Encounter (Signed)
Spoke with MR:  He states pt having a flare due to weather, ok to call in 5 day pred taper. Please call if s/s worsen or do not improve.    Called and spoke to pt. Informed her of the recs per MR. Rx sent to preferred pharmacy. Pt verbalized understanding and denied any further questions or concerns at this time.

## 2017-01-02 NOTE — Telephone Encounter (Signed)
Pt states she has an appt with Dr. Milinda Pointer tomorrow and if she is going to have the procedure at that time, would she need a driver. I reviewed LOV and pt is coming in for a surgery consultation with Dr. Milinda Pointer tomorrow. I left message informing pt she would not be having the surgery tomorrow, only getting information, instructions and signing paperwork.

## 2017-01-02 NOTE — Telephone Encounter (Signed)
Pt c/o stable chest tightness, sob since being seen by SG on Monday.  States she felt better after the neb treatment given in clinic, but started feeling worse today.  Pt states the chest tightness is worse only with exertion.  Denies fever, sharp pains, mucus production.  Requesting a prednisone taper.    Pt uses [facility] on [address].    MR please advise on recs.  Thanks.

## 2017-01-03 ENCOUNTER — Ambulatory Visit (INDEPENDENT_AMBULATORY_CARE_PROVIDER_SITE_OTHER): Payer: Medicare Other | Admitting: Podiatry

## 2017-01-03 ENCOUNTER — Encounter: Payer: Self-pay | Admitting: Podiatry

## 2017-01-03 DIAGNOSIS — M2042 Other hammer toe(s) (acquired), left foot: Secondary | ICD-10-CM | POA: Diagnosis not present

## 2017-01-04 ENCOUNTER — Ambulatory Visit: Payer: Medicare Other | Admitting: Internal Medicine

## 2017-01-04 NOTE — Progress Notes (Signed)
Presents today to discuss surgical intervention of her second toe left foot. She states the last time she was in we talked about a tenotomy to the second digit of the left foot. I expressed to her at that time that this may help.  Objective: Vital signs are stable she is alert and oriented 3. Hallux valgus deformity with cocked up hammertoe deformity second left resulting in tenderness overlying the PIPJ. The toe is relatively rigid in nature at the PIPJ and DIPJ.  Assessment: Hammertoe deformity contracted digit second left.  Plan: Extensor and flexor tenotomy with sutures this was performed today after local anesthesia was administered she tolerated the procedure well without complications she was dispensed a Darco shoe after dry sterile dressing was applied she is to leave this on and follow up with [place] in 1 week.

## 2017-01-10 ENCOUNTER — Encounter: Payer: Self-pay | Admitting: Podiatry

## 2017-01-10 ENCOUNTER — Ambulatory Visit (INDEPENDENT_AMBULATORY_CARE_PROVIDER_SITE_OTHER): Payer: Medicare Other | Admitting: Podiatry

## 2017-01-10 DIAGNOSIS — M2042 Other hammer toe(s) (acquired), left foot: Secondary | ICD-10-CM

## 2017-01-10 NOTE — Progress Notes (Signed)
She presents today for a follow-up of her tenotomy second digit of the left foot states that it seems to be doing very well explained out perfectly in the very happy with it.  Objective: Vital signs are stable alert and oriented 3 sutures were removed marginal coapted the tenotomy has allowed the toe to sit in rectus and she is very happy with the outcome.  Assessment: 1 tenotomy second digit left.  Plan: Follow up with me in 1 month or as needed.

## 2017-01-14 ENCOUNTER — Ambulatory Visit (INDEPENDENT_AMBULATORY_CARE_PROVIDER_SITE_OTHER): Payer: Medicare Other | Admitting: Adult Health

## 2017-01-14 ENCOUNTER — Encounter: Payer: Self-pay | Admitting: Adult Health

## 2017-01-14 ENCOUNTER — Other Ambulatory Visit (INDEPENDENT_AMBULATORY_CARE_PROVIDER_SITE_OTHER): Payer: Medicare Other

## 2017-01-14 VITALS — BP 118/64 | HR 80 | Ht 67.0 in | Wt 161.8 lb

## 2017-01-14 DIAGNOSIS — J45909 Unspecified asthma, uncomplicated: Secondary | ICD-10-CM | POA: Diagnosis not present

## 2017-01-14 DIAGNOSIS — I519 Heart disease, unspecified: Secondary | ICD-10-CM | POA: Diagnosis not present

## 2017-01-14 DIAGNOSIS — I5189 Other ill-defined heart diseases: Secondary | ICD-10-CM

## 2017-01-14 DIAGNOSIS — R0602 Shortness of breath: Secondary | ICD-10-CM

## 2017-01-14 LAB — BASIC METABOLIC PANEL
BUN: 25 mg/dL — AB (ref 6–23)
CALCIUM: 8.9 mg/dL (ref 8.4–10.5)
CHLORIDE: 103 meq/L (ref 96–112)
CO2: 25 mEq/L (ref 19–32)
Creatinine, Ser: 1.17 mg/dL (ref 0.40–1.20)
GFR: 56.66 mL/min — ABNORMAL LOW (ref >60.00)
Glucose, Bld: 101 mg/dL — ABNORMAL HIGH (ref 70–99)
Potassium: 4.4 mEq/L (ref 3.5–5.1)
Sodium: 137 mEq/L (ref 135–145)

## 2017-01-14 LAB — CBC WITH DIFFERENTIAL/PLATELET
BASOS PCT: 0.9 % (ref 0.0–3.0)
Basophils Absolute: 0.1 10*3/uL (ref 0.0–0.1)
EOS PCT: 2.8 % (ref 0.0–5.0)
Eosinophils Absolute: 0.2 10*3/uL (ref 0.0–0.7)
HCT: 36.9 % (ref 36.0–46.0)
Hemoglobin: 12.3 g/dL (ref 12.0–15.0)
LYMPHS ABS: 2.4 10*3/uL (ref 0.7–4.0)
Lymphocytes Relative: 34.8 % (ref 12.0–46.0)
MCHC: 33.3 g/dL (ref 30.0–36.0)
MCV: 93.2 fl (ref 78.0–100.0)
MONOS PCT: 9.5 % (ref 3.0–12.0)
Monocytes Absolute: 0.7 10*3/uL (ref 0.1–1.0)
NEUTROS ABS: 3.6 10*3/uL (ref 1.4–7.7)
NEUTROS PCT: 52 % (ref 43.0–77.0)
Platelets: 290 10*3/uL (ref 150.0–400.0)
RBC: 3.96 Mil/uL (ref 3.87–5.11)
RDW: 14.4 % (ref 11.5–15.5)
WBC: 7 10*3/uL (ref 4.0–10.5)

## 2017-01-14 LAB — BRAIN NATRIURETIC PEPTIDE: PRO B NATRI PEPTIDE: 65 pg/mL (ref 0.0–100.0)

## 2017-01-14 MED ORDER — FLUTICASONE FUROATE-VILANTEROL 100-25 MCG/INH IN AEPB: 1.0000 | INHALATION_SPRAY | Freq: Every day | RESPIRATORY_TRACT | 0 refills | Status: DC

## 2017-01-14 NOTE — Assessment & Plan Note (Signed)
Asthma - does not appear to have an active flare.  Can continue BREO for now . If improved on return , can change back to Arnuity .  Suspect symptoms are from anxiety .  Check labs today w/ cbc, bnp, bmet.   Plan  Patient Instructions  Continue on BREO 1 puff daily.  Discuss with PCP regarding counseling .  Labs today .  Follow up with Dr. Chase Caller next month as planned and As needed   Please contact office for sooner follow up if symptoms do not improve or worsen or seek emergency care

## 2017-01-14 NOTE — Assessment & Plan Note (Signed)
Appears compensated . No evidence of fluid overload.  Check bnp today .

## 2017-01-14 NOTE — Addendum Note (Signed)
Addended by: Parke Poisson E on: 01/14/2017 04:11 PM   Modules accepted: Orders

## 2017-01-14 NOTE — Progress Notes (Signed)
@Patient  ID: Anne [name], female    DOB: 12/10/1930, 83 y.o.   MRN: [id number]  Chief Complaint  Patient presents with  . Acute Visit    Referring provider: Merlene Laughter, MD  HPI: 83 year old female never smoker followed for mild persistent asthma History of diastolic dysfunction Known chronic right middle lobe collapse/ lymphadenopathy -s/p 07/2007 FOB +psuedomonas  Test Exhaled nitric oxide test May 2018: 17 ppb High resolution CT chest August 2016 showed no evidence of interstitial lung disease, chronic collapse of right middle lobe MEdiastinaoscopy 07/27/2009 - Benign node of 4R and 4L. ANthracotoic pigment +. No granuoma. Non malignant PFT 10/28/2014 FEV1 94, ratio 54, FVC 135, no significant bronchodilator response, mid flows reversibility, DLCO 65%   01/14/2017 Acute OV : Asthma  Patient presents for an acute office visit. She complains of  persistent sob  . Mainly at night time when she tries to go to sleep.  When she is moving around and staying busy she is not short of breath. She denies wheezing , cough , or drainage. . Was seen 2 weeks ago , changed from Arnuity to [facility] . CXR showed nad. Appetite is good. Denies n/v.d.   She complains of stress, found her friend dead at home (died from MI ) . Since then has been very anxious and tearful. Keeps thinking about it at night . Feels this is causes her symptoms.  Was seen at Urgent care and got EKG that was told was normal .  She follows with Cardiology Dr. Katrinka Blazing. Stress test in 2016 was low risk .  She denies chest pain, radiating pain, exertional chest pain or dyspnea.  Unclear if BREO is helping or not.       Allergies  Allergen Reactions  . Other Shortness Of Breath    Pt states she has asthma and seafood causes breathing problems.   . Shellfish Allergy Shortness Of Breath  . Iodine Other (See Comments)    REACTION: " UNSURE TOLD TO LIST AS ALLERGEN BY MD" REACTION: " UNSURE TOLD TO LIST AS ALLERGEN BY MD"  .  Latex Itching  . Adhesive [Tape] Rash    Immunization History  Administered Date(s) Administered  . Influenza Whole 04/04/2009  . Influenza, High Dose Seasonal PF 12/31/2016  . Influenza,inj,Quad PF,6+ Mos 01/03/2015, 12/02/2015  . Influenza-Unspecified 02/14/2014  . Pneumococcal Polysaccharide-23 06/01/2003  . Pneumococcal-Unspecified 01/03/2015    Past Medical History:  Diagnosis Date  . Allergic rhinitis   . Arthritis   . Asthma   . Atypical chest pain   . Collapse of right lung   . Dyslipidemia   . Gastroesophageal reflux disease   . Glaucoma   . Hematuria   . Hyperlipidemia   . Lung nodules     Tobacco History: History  Smoking Status  . Never Smoker  Smokeless Tobacco  . Never Used   Counseling given: Not Answered   Outpatient Encounter Prescriptions as of 01/14/2017  Medication Sig  . albuterol (PROAIR HFA) 108 (90 Base) MCG/ACT inhaler Inhale 1-2 puffs into the lungs every 6 (six) hours as needed for wheezing or shortness of breath.  Marland Kitchen albuterol (PROVENTIL) (2.5 MG/3ML) 0.083% nebulizer solution Take 3 mLs (2.5 mg total) by nebulization every 6 (six) hours as needed for wheezing or shortness of breath.  Marland Kitchen aspirin EC 81 MG tablet Take 1 tablet (81 mg total) by mouth daily.  . Calcium Carb-Cholecalciferol (CALCIUM 600 + D) 600-200 MG-UNIT TABS Take 2 tablets by mouth daily.   Marland Kitchen  cetirizine (ZYRTEC) 10 MG tablet Take 10 mg by mouth every morning.  . fluticasone (FLONASE) 50 MCG/ACT nasal spray Place 1 spray into both nostrils 2 (two) times daily as needed for allergies.   . Fluticasone Furoate (ARNUITY ELLIPTA) 100 MCG/ACT AEPB Inhale 1 puff into the lungs daily.  . fluticasone furoate-vilanterol (BREO ELLIPTA) 100-25 MCG/INH AEPB Inhale 1 puff into the lungs daily.  Marland Kitchen latanoprost (XALATAN) 0.005 % ophthalmic solution Place 1 drop into both eyes at bedtime.  . Multiple Vitamin (MULTIVITAMIN) tablet Take 1 tablet by mouth every morning.   . TURMERIC PO Take 1  capsule by mouth daily.  . [DISCONTINUED] ADVAIR DISKUS 100-50 MCG/DOSE AEPB INHALE 1 PUFF 2 TIMES DAILY (Patient not taking: Reported on 01/14/2017)  . [DISCONTINUED] predniSONE (DELTASONE) 10 MG tablet 40 mg x1 day, then 30 mg x1 day, then 20 mg x1 day, then 10 mg x1 day, and then 5 mg x1 day and stop (Patient not taking: Reported on 01/14/2017)   Facility-Administered Encounter Medications as of 01/14/2017  Medication  . regadenoson (LEXISCAN) injection SOLN 0.4 mg     Review of Systems  Constitutional:   No  weight loss, night sweats,  Fevers, chills, fatigue, or  lassitude.  HEENT:   No headaches,  Difficulty swallowing,  Tooth/dental problems, or  Sore throat,                No sneezing, itching, ear ache, nasal congestion, post nasal drip,   CV:  No chest pain,  Orthopnea, PND, swelling in lower extremities, anasarca, dizziness, palpitations, syncope.   GI  No heartburn, indigestion, abdominal pain, nausea, vomiting, diarrhea, change in bowel habits, loss of appetite, bloody stools.   Resp:  .  No excess mucus, no productive cough,  No non-productive cough,  No coughing up of blood.  No change in color of mucus.  No wheezing.  No chest wall deformity  Skin: no rash or lesions.  GU: no dysuria, change in color of urine, no urgency or frequency.  No flank pain, no hematuria   MS:  No joint pain or swelling.  No decreased range of motion.  No back pain.    Physical Exam  BP 118/64 (BP Location: Left Arm, Cuff Size: Normal)   Pulse 80   Ht 5\' 7"  (1.702 m)   Wt 161 lb 12.8 oz (73.4 kg)   SpO2 96%   BMI 25.34 kg/m   GEN: A/Ox3; pleasant , NAD , elderly    HEENT:  [place]/AT,  EACs-clear, TMs-wnl, NOSE-clear, THROAT-clear, no lesions, no postnasal drip or exudate noted.   NECK:  Supple w/ fair ROM; no JVD; normal carotid impulses w/o bruits; no thyromegaly or nodules palpated; no lymphadenopathy.    RESP  Clear  P & A; w/o, wheezes/ rales/ or rhonchi. no accessory muscle  use, no dullness to percussion  CARD:  RRR, no m/r/g, no peripheral edema, pulses intact, no cyanosis or clubbing.  GI:   Soft & nt; nml bowel sounds; no organomegaly or masses detected.   Musco: Warm bil, no deformities or joint swelling noted.   Neuro: alert, no focal deficits noted.    Skin: Warm, no lesions or rashes    Lab Results:   BMET   BNP  ProBNP No results found for: PROBNP  Imaging: Dg Chest 2 View  Result Date: 12/31/2016 CLINICAL DATA:  Dyspnea for the past week. History of asthma and right-sided pneumothorax. EXAM: CHEST  2 VIEW COMPARISON:  Chest x-ray of  March 16, 2017 FINDINGS: The lungs are mildly hyperinflated. There is no focal infiltrate. There is no pleural effusion. The heart and pulmonary vascularity are normal. The mediastinum is normal in width. There is calcification of the wall of the aortic arch. The bony thorax exhibits no acute abnormality. IMPRESSION: Mild hyperinflation consistent with known reactive airway disease. No pneumonia, CHF, nor other acute cardiopulmonary abnormality. Thoracic aortic atherosclerosis. Electronically Signed   By: David  [place] M.D.   On: 12/31/2016 12:48     Assessment & Plan:   Asthma Asthma - does not appear to have an active flare.  Can continue BREO for now . If improved on return , can change back to Arnuity .  Suspect symptoms are from anxiety .  Check labs today w/ cbc, bnp, bmet.   Plan  Patient Instructions  Continue on BREO 1 puff daily.  Discuss with PCP regarding counseling .  Labs today .  Follow up with Dr. Marchelle Gearing next month as planned and As needed   Please contact office for sooner follow up if symptoms do not improve or worsen or seek emergency care        Diastolic dysfunction Appears compensated . No evidence of fluid overload.  Check bnp today .    Dyspnea Dyspnea ? Etiology . O2 sats normal .  CXR okay . No apparent asthma or CHF flare .  Check labs today .   Plan    Patient Instructions  Continue on BREO 1 puff daily.  Discuss with PCP regarding counseling .  Labs today .  Follow up with Dr. Marchelle Gearing next month as planned and As needed   Please contact office for sooner follow up if symptoms do not improve or worsen or seek emergency care          Rubye Oaks, NP 01/14/2017

## 2017-01-14 NOTE — Assessment & Plan Note (Signed)
Dyspnea ? Etiology . O2 sats normal .  CXR okay . No apparent asthma or CHF flare .  Check labs today .   Plan  Patient Instructions  Continue on BREO 1 puff daily.  Discuss with PCP regarding counseling .  Labs today .  Follow up with Dr. Chase Caller next month as planned and As needed   Please contact office for sooner follow up if symptoms do not improve or worsen or seek emergency care

## 2017-01-14 NOTE — Patient Instructions (Addendum)
Continue on BREO 1 puff daily.  Discuss with PCP regarding counseling .  Labs today .  Follow up with Dr. Chase Caller next month as planned and As needed   Please contact office for sooner follow up if symptoms do not improve or worsen or seek emergency care

## 2017-01-16 ENCOUNTER — Telehealth: Payer: Self-pay | Admitting: Adult Health

## 2017-01-16 NOTE — Telephone Encounter (Signed)
CHF marker is good.  No sign of anemia .  Nothing to explain dyspnea.  Cont w/ ov recs Please contact office for sooner follow up if symptoms do not improve or worsen or seek emergency care   Called and spoke with pt and she is aware of results per TP.  Copy of these have been mailed to the pt per her request.

## 2017-02-01 ENCOUNTER — Telehealth: Payer: Self-pay | Admitting: Podiatry

## 2017-02-01 NOTE — Telephone Encounter (Signed)
I saw Dr. Milinda Pointer on 04 October and had a toe procedure then I came back the following week and he removed the stitches. For the past two weeks I've been having severe pain in my toe that is radiating up into my leg. I have questions as to what he thinks may be going on and what my next steps may be. Please call me back at [phone]. Thank you.

## 2017-02-04 ENCOUNTER — Telehealth: Payer: Self-pay | Admitting: Podiatry

## 2017-02-04 NOTE — Telephone Encounter (Signed)
I called Friday morning and left a detailed message about the surgery he did. I need some information and I have not heard from anyone. I don't know what to do at this point. I would appreciate would return my call and tell me what I should do next because I've been in excruciating pain for the past four days. Please call me back at [phone]. I called Friday and left a message around the same time.

## 2017-02-04 NOTE — Telephone Encounter (Signed)
Left message informing pt I had seen her message last week and lost it and apologized for the delay in calling. I told pt I saw that she was seeing Dr. Milinda Pointer tomorrow, and she would benefit from going in to a stiff bottom shoe, and rest.

## 2017-02-05 ENCOUNTER — Encounter: Payer: Self-pay | Admitting: Podiatry

## 2017-02-05 ENCOUNTER — Ambulatory Visit: Payer: Medicare Other | Admitting: Podiatry

## 2017-02-05 DIAGNOSIS — M778 Other enthesopathies, not elsewhere classified: Secondary | ICD-10-CM

## 2017-02-05 DIAGNOSIS — M779 Enthesopathy, unspecified: Secondary | ICD-10-CM

## 2017-02-05 DIAGNOSIS — M7752 Other enthesopathy of left foot: Secondary | ICD-10-CM

## 2017-02-05 DIAGNOSIS — M2042 Other hammer toe(s) (acquired), left foot: Secondary | ICD-10-CM

## 2017-02-06 ENCOUNTER — Telehealth: Payer: Self-pay | Admitting: *Deleted

## 2017-02-06 DIAGNOSIS — D3610 Benign neoplasm of peripheral nerves and autonomic nervous system, unspecified: Secondary | ICD-10-CM

## 2017-02-06 DIAGNOSIS — M779 Enthesopathy, unspecified: Secondary | ICD-10-CM

## 2017-02-06 NOTE — Progress Notes (Signed)
She presents today chief complaint of a painful toe second digit of the right foot. She still states that she still has sharp pain at nighttime in particular with numbness in her toes. She states that after the tenotomy the toe sits down but is no longer painful like it was but I have still have sharp shooting pains in the toe and is still very painful. She states that she is requesting an MRI for her foot.  Objective: Vital signs are stable she is alert and oriented 3 she does have pain on palpation of the toe itself but is much the toe as the second metatarsophalangeal joint as well. She also has tenderness on palpation to the second and third interdigital spaces with palpable Mulder's click's consistent with a neuroma.  Assessment: Hammertoe deformities with capsulitis of the second metatarsophalangeal joint. 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  Plan: I feel that an MRI of the forefoot left be beneficial in the diagnosis of this since it has been going for a long this could lead to surgical direction.

## 2017-02-06 NOTE — Telephone Encounter (Signed)
-----   Message from Rip Harbour, [facility] sent at 02/05/2017  2:20 PM EST ----- Regarding: MRI  MRI foot left - evaluate forefoot-capsulitis 2nd MPJ with tear vs neuroma left-surgical consideration

## 2017-02-07 ENCOUNTER — Other Ambulatory Visit: Payer: Self-pay | Admitting: Obstetrics and Gynecology

## 2017-02-07 DIAGNOSIS — N6489 Other specified disorders of breast: Secondary | ICD-10-CM

## 2017-02-12 ENCOUNTER — Telehealth: Payer: Self-pay | Admitting: Internal Medicine

## 2017-02-12 ENCOUNTER — Encounter: Payer: Self-pay | Admitting: Internal Medicine

## 2017-02-12 ENCOUNTER — Ambulatory Visit: Payer: Medicare Other | Admitting: Internal Medicine

## 2017-02-12 VITALS — BP 126/66 | HR 75 | Ht 67.0 in | Wt 165.0 lb

## 2017-02-12 DIAGNOSIS — J4530 Mild persistent asthma, uncomplicated: Secondary | ICD-10-CM | POA: Diagnosis not present

## 2017-02-12 DIAGNOSIS — J45909 Unspecified asthma, uncomplicated: Secondary | ICD-10-CM | POA: Diagnosis not present

## 2017-02-12 LAB — NITRIC OXIDE: Nitric Oxide: 18

## 2017-02-12 NOTE — Telephone Encounter (Signed)
Called pt and advised message from the provider. Pt understood and verbalized understanding. Nothing further is needed.    

## 2017-02-12 NOTE — Addendum Note (Signed)
Addended by: Mathis Bud on: 02/12/2017 11:48 AM   Modules accepted: Orders

## 2017-02-12 NOTE — Telephone Encounter (Signed)
Yes; sorry forgot to mention  Dr. Brand Males, M.D., [facility].C.P Pulmonary and Critical Care Medicine Staff Physician [place] [facility]monary and Critical Care Pager: [phone], If no answer or between  15:00h - 7:00h: call [phone]  02/12/2017 4:20 PM

## 2017-02-12 NOTE — Progress Notes (Signed)
Subjective:     Patient ID: Anne [name], female   DOB: 05-24-1930, 83 y.o.   MRN: [id number]  HPI   OV 12/23/2014  Chief Complaint  Patient presents with  . Follow-up    Pt here after the CT chest and myocardial perfusion study. Pt states her breathing is doing well. Pt states when she swallows she feels there is a bloackage.  Pt denies SOB, cough, and CP/tightness.    Follow-up dyspnea  - At last visit I had her do CT scan of the chest which did not show any change compared to the chronic changes she had several years ago. She also underwent nuclear medicine cardiac stress test in interim. I did a review the results of these are normal. In the interim her dyspnea has significantly improved. She is feeling better. She is on ICS/LABA Symbicort for few months. Prior to that she was on Advair for a few years. She is worried about the side effects of long-acting beta agonists and wants to reduce down to inhaled steroidal. She is not worried about side effects of ICS. The basis of her dyspnea is unknown. Asthma diagnosis was never established. The treatment with inhalers are completely empiric. I tried to rationalize about stopping inhalers altogether but she's not willing to undergo the strategy. However she only wants to give up long-acting beta agonist due to cardiac side effects. She will have a flu shot with her primary care physician next week   reports that she has never smoked. She has never used smokeless tobacco.   OV 03/07/2015  Chief Complaint  Patient presents with  . Follow-up    Pt states her breathing is unchanged since last OV. Pt is now taking Qvar and stopped Symbicort, pt states she has not noticed a difference in her breathing. Pt states her SOB is at baseline. Pt denies cough and CP/tightness.     Dyspnea follow up. Associated asthma diagnosis of presumptive one  Currently she reports she is better. She told my medical assistant that her dyspnea is baseline but when I  spoke to her she said that dyspnea is better. It is not noticeable much. She does not want to do pulmonary stress testing. She is now taking only Qvar. She is tolerating this fine. Dyspnea did not get worse when she scaled down from Symbicort to Qvar. We tried to do exhaled nitric oxide testing on her today but she could not follow through. At this point she is content continuing with Qvar    OV 10/18/2015  Chief Complaint  Patient presents with  . Follow-up    pt c/o sob with exertion, noted throat clearing in office.      Follow-up dyspnea in association with presumptive asthma. Last seen December 2016. At that time he gave her Qvar. Now on advair; did not like qvar. Overall she feels stable. She still is bothered by the are proportion dyspnea when she excercises. Last time she refused to have cardiopulmonary stress testing but this time she is inquiring about it and seems open to it.    OV 10/26/2015  Chief Complaint  Patient presents with  . Follow-up    review CPST.  Pt's breathing is unchanged since last visit.     Anne [name] is here to review CPST done yesterday. Key findings of the pulmonary stress test was that she did an adequate test with a respiratory exchange ratio 1.22. Her VO2 max was 15.6 mL per kilogram ideal body weight 110%  of predicted. Which is normal. Anaerobic threshold was 72% and again normal. There is no cardiac arrhythmias. Resting blood pressure was similar to her today 130s over 70. However she had a significantly hypertensive response to exercise with a systolic blood pressure 240 systolic at peak exercise. In association with this her stroke volume, was flat with the end of exercise. This is hypertensive response to exercise associated with diastolic dysfunction. She is not on any baseline antihypertensive drugs.  These results were shared with the patient.  OV 08/07/2016  Chief Complaint  Patient presents with  . Follow-up    Pt states her breathing is  unchanged since last OV. Pt denies cough, CP/tightness, f/c/s. Pt c/o seasonal allergies - rhinorrhea and runny eyes.     83 year old female for follow-up of dyspnea due to presumed asthma and also diastolic dysfunction  Last seen in summer 2017. After which I referred her to Dr. Verdis Prime in cardiology. I reviewed his chart and after trying different antihypertensive medications she is actually better. Currently she feels dyspnea significantly improved. Her last visit with Dr. Katrinka Blazing was in September 2017. She's been reassured. She tells me that other than current allergies bothering her sinuses and causing sneezing and postnasal drip she is doing well from a dyspnea standpoint. Currently she is not on any antihypertensives She has scaled her Advair down to 1 time per day. She wants to know if she can scaled is down even further.  Last cxr dec 2016: hyperinflation  Exhaled nitric oxide test today in the office May 2018: 17ppb and normal   01/14/2017 Acute OV : Asthma  Patient presents for an acute office visit. She complains of  persistent sob  . Mainly at night time when she tries to go to sleep.  When she is moving around and staying busy she is not short of breath. She denies wheezing , cough , or drainage. . Was seen 2 weeks ago , changed from [facility] to [facility] . CXR showed nad. Appetite is good. Denies n/v.d.   She complains of stress, found her friend dead at home (died from MI ) . Since then has been very anxious and tearful. Keeps thinking about it at night . Feels this is causes her symptoms.  Was seen at Urgent care and got EKG that was told was normal .  She follows with Cardiology Dr. Katrinka Blazing. Stress test in 2016 was low risk .  She denies chest pain, radiating pain, exertional chest pain or dyspnea.  Unclear if BREO is helping or not.    83 year old female never smoker followed for mild persistent asthma History of diastolic dysfunction Known chronic right middle lobe collapse/  lymphadenopathy -s/p 07/2007 FOB +psuedomonas  Test Exhaled nitric oxide test May 2018: 17 ppb High resolution CT chest August 2016 showed no evidence of interstitial lung disease, chronic collapse of right middle lobe MEdiastinaoscopy 07/27/2009 - Benign node of 4R and 4L. ANthracotoic pigment +. No granuoma. Non malignant PFT 10/28/2014 FEV1 94, ratio 54, FVC 135, no significant bronchodilator response, mid flows reversibility, DLCO 65%  OV 02/12/2017 Chief Complaint  Patient presents with  . Follow-up    Pt states that she is doing good currently; had a couple flare-ups in October where she came to see one of our NP's. Denies any current cough, SOB, or CP.     83 year old female with mild persistent asthma.  I personally saw her in 2017 then in October 2018 she had asthma episodes that required a step  up in her inhaler therapy from inhaled corticosteroid to Brio which contains inhaled corticosteroid and long-acting beta agonist.  She now presents for follow-up.  Previous notes reviewed.  She tells me that with the upsize and inhaler therapy she is actually better.  She also feels the weather in October 2018 cause problems.  Her asthma control questionnaire is 0.4 out of 5 point scale.  She is not waking up in the night because of asthma.  When she wakes up she does not have any symptoms.  She feels only very slightly limited because of asthma.  She expresses a very little short of breath because of asthma.  She has not wheezed to use albuterol for rescue.  We decided against that nitric oxide test but patient personally asked that it be done.  Her nitric oxide test today is- 18ppb on breo andnormal  She is also frustrated by the hammertoe that she has that is prevented her from doing long walks.  She is planning to join water aerobics.  She is frustrated by her weight gain.       has a past medical history of Allergic rhinitis, Arthritis, Asthma, Atypical chest pain, Collapse of right lung,  Dyslipidemia, Gastroesophageal reflux disease, Glaucoma, Hematuria, Hyperlipidemia, and Lung nodules.   reports that  has never smoked. she has never used smokeless tobacco.  Past Surgical History:  Procedure Laterality Date  . ANAL FISSURE REPAIR    . BREAST BIOPSY Right   . CATARACT EXTRACTION Bilateral   . MEDIASTINOSCOPY  4/11  . NASAL SINUS SURGERY    . TOTAL ABDOMINAL HYSTERECTOMY W/ BILATERAL SALPINGOOPHORECTOMY      Allergies  Allergen Reactions  . Other Shortness Of Breath    Pt states she has asthma and seafood causes breathing problems.   . Shellfish Allergy Shortness Of Breath  . Iodine Other (See Comments)    REACTION: " UNSURE TOLD TO LIST AS ALLERGEN BY MD" REACTION: " UNSURE TOLD TO LIST AS ALLERGEN BY MD"  . Latex Itching  . Adhesive [Tape] Rash    Immunization History  Administered Date(s) Administered  . Influenza Whole 04/04/2009  . Influenza, High Dose Seasonal PF 12/31/2016  . Influenza,inj,Quad PF,6+ Mos 01/03/2015, 12/02/2015  . Influenza-Unspecified 02/14/2014  . Pneumococcal Polysaccharide-23 06/01/2003  . Pneumococcal-Unspecified 01/03/2015    Family History  Problem Relation Age of Onset  . Cancer Mother   . Heart disease Father   . Breast cancer Cousin   . Heart attack Neg Hx      Current Outpatient Medications:  .  albuterol (PROAIR HFA) 108 (90 Base) MCG/ACT inhaler, Inhale 1-2 puffs into the lungs every 6 (six) hours as needed for wheezing or shortness of breath., Disp: 1 Inhaler, Rfl: 3 .  albuterol (PROVENTIL) (2.5 MG/3ML) 0.083% nebulizer solution, Take 3 mLs (2.5 mg total) by nebulization every 6 (six) hours as needed for wheezing or shortness of breath., Disp: 75 mL, Rfl: 12 .  aspirin EC 81 MG tablet, Take 1 tablet (81 mg total) by mouth daily., Disp: 90 tablet, Rfl: 3 .  Calcium Carb-Cholecalciferol (CALCIUM 600 + D) 600-200 MG-UNIT TABS, Take 2 tablets by mouth daily. , Disp: , Rfl:  .  cetirizine (ZYRTEC) 10 MG tablet, Take 10  mg by mouth every morning., Disp: , Rfl:  .  fluticasone (FLONASE) 50 MCG/ACT nasal spray, Place 1 spray into both nostrils 2 (two) times daily as needed for allergies. , Disp: , Rfl:  .  fluticasone furoate-vilanterol (BREO ELLIPTA) 100-25  MCG/INH AEPB, Inhale 1 puff into the lungs daily., Disp: 1 each, Rfl: 3 .  latanoprost (XALATAN) 0.005 % ophthalmic solution, Place 1 drop into both eyes at bedtime., Disp: , Rfl:  .  Multiple Vitamin (MULTIVITAMIN) tablet, Take 1 tablet by mouth every morning. , Disp: , Rfl:  .  TURMERIC PO, Take 1 capsule by mouth daily., Disp: , Rfl:  No current facility-administered medications for this visit.   Facility-Administered Medications Ordered in Other Visits:  .  regadenoson (LEXISCAN) injection SOLN 0.4 mg, 0.4 mg, Intravenous, Once, Turner, Cornelious Bryant, MD    Review of Systems     Objective:   Physical Exam  Constitutional: She is oriented to person, place, and time. She appears well-developed and well-nourished. No distress.  HENT:  Head: Normocephalic and atraumatic.  Right Ear: External ear normal.  Left Ear: External ear normal.  Mouth/Throat: Oropharynx is clear and moist. No oropharyngeal exudate.  Eyes: Conjunctivae and EOM are normal. Pupils are equal, round, and reactive to light. Right eye exhibits no discharge. Left eye exhibits no discharge. No scleral icterus.  Neck: Normal range of motion. Neck supple. No JVD present. No tracheal deviation present. No thyromegaly present.  Cardiovascular: Normal rate, regular rhythm, normal heart sounds and intact distal pulses. Exam reveals no gallop and no friction rub.  No murmur heard. Pulmonary/Chest: Effort normal and breath sounds normal. No respiratory distress. She has no wheezes. She has no rales. She exhibits no tenderness.  Abdominal: Soft. Bowel sounds are normal. She exhibits no distension and no mass. There is no tenderness. There is no rebound and no guarding.  Musculoskeletal: Normal range  of motion. She exhibits no edema or tenderness.  Lymphadenopathy:    She has no cervical adenopathy.  Neurological: She is alert and oriented to person, place, and time. She has normal reflexes. No cranial nerve deficit. She exhibits normal muscle tone. Coordination normal.  Skin: Skin is warm and dry. No rash noted. She is not diaphoretic. No erythema. No pallor.  Psychiatric: She has a normal mood and affect. Her behavior is normal. Judgment and thought content normal.  Vitals reviewed.   Vitals:   02/12/17 1104  BP: 126/66  Pulse: 75  SpO2: 99%  Weight: 165 lb (74.8 kg)  Height: 5\' 7"  (1.702 m)    Estimated body mass index is 25.84 kg/m as calculated from the following:   Height as of this encounter: 5\' 7"  (1.702 m).   Weight as of this encounter: 165 lb (74.8 kg).     Assessment:       ICD-10-CM   1. Mild persistent asthma without complication J45.30   2. Mild asthma without complication, unspecified whether persistent J45.909        Plan:      Asthma well controlled on history and nitiric oxide test  Plan Continue breo daily  Followup 3 months or sooner if needed (close followup due to recent flareup)   Dr. Kalman Shan, M.D., [facility].C.P Pulmonary and Critical Care Medicine Staff Physician [facility]monary and Critical Care Pager: [phone], If no answer or between  15:00h - 7:00h: call [phone]  02/12/2017 11:26 AM

## 2017-02-12 NOTE — Patient Instructions (Addendum)
ICD-10-CM   1. Mild persistent asthma without complication Q76.22   2. Mild asthma without complication, unspecified whether persistent J45.909    Asthma well controlled on history and nitiric oxide test  Plan Continue breo daily  Followup 3 months or sooner if needed (close followup due to recent flareup)

## 2017-02-12 NOTE — Telephone Encounter (Signed)
MR do you want pt to continue Singular?  1. Mild persistent asthma without complication V69.79   2. Mild asthma without complication, unspecified whether persistent J45.909    Asthma well controlled on history and nitiric oxide test  Plan Continue breo daily  Followup 3 months or sooner if needed (close followup due to recent flareup

## 2017-02-19 ENCOUNTER — Ambulatory Visit
Admission: RE | Admit: 2017-02-19 | Discharge: 2017-02-19 | Disposition: A | Discharge status: Home / Self Care | Payer: Medicare Other | Source: Ambulatory Visit | Attending: Podiatry | Admitting: Podiatry

## 2017-02-25 ENCOUNTER — Telehealth: Payer: Self-pay | Admitting: Podiatry

## 2017-02-25 ENCOUNTER — Telehealth: Payer: Self-pay | Admitting: *Deleted

## 2017-02-25 NOTE — Telephone Encounter (Signed)
Left message informing pt Dr. Milinda Pointer had reviewed her MRI and I would be able to give her the results if she would call, and that she would need to sign medical releases for copy of the records and disc.

## 2017-02-25 NOTE — Telephone Encounter (Signed)
-----   Message from Garrel Ridgel, [place] sent at 02/25/2017  6:56 AM EST ----- Arthritis and hammer toes

## 2017-02-25 NOTE — Telephone Encounter (Signed)
I had an MRI done last Tuesday and I was wanting to know if you have the results yet. If so, I would like to get the results as well as a copy. I suppose Dr. Milinda Pointer will have to go over them before he calls me. My number here is [phone]. Thank you.

## 2017-02-26 NOTE — Telephone Encounter (Signed)
Pt states she is returning my call for results. I informed pt of Dr. Stephenie Acres review of results and pt requested copy of the report. I reiterated she would need to sign a medical release for the MRI report. Pt states understanding and wanted to know what Dr. Milinda Pointer recommended after reviewing the MRI and I told her she would need an appt to discuss with him.

## 2017-03-06 ENCOUNTER — Ambulatory Visit
Admission: RE | Admit: 2017-03-06 | Discharge: 2017-03-06 | Disposition: A | Discharge status: Home / Self Care | Payer: Medicare Other | Source: Ambulatory Visit | Attending: Obstetrics and Gynecology | Admitting: Obstetrics and Gynecology

## 2017-03-06 DIAGNOSIS — N6489 Other specified disorders of breast: Secondary | ICD-10-CM

## 2017-03-12 ENCOUNTER — Ambulatory Visit: Payer: Medicare Other | Admitting: Podiatry

## 2017-03-14 ENCOUNTER — Ambulatory Visit: Payer: Medicare Other | Admitting: Podiatry

## 2017-03-14 ENCOUNTER — Encounter: Payer: Self-pay | Admitting: Podiatry

## 2017-03-14 DIAGNOSIS — G5792 Unspecified mononeuropathy of left lower limb: Secondary | ICD-10-CM | POA: Diagnosis not present

## 2017-03-14 NOTE — Progress Notes (Signed)
She presents today for follow-up of her MRI to her second digit of her left foot.  She states that it really feels the same and I still that there is electrical shocks I just do not get him quite as often and the pain in the toe is not nearly as bad and the swelling does not seem to be as bad either.  Objective: Vital signs are stable she is alert and oriented x3.  Pulses are palpable.  Neurologic sensorium is intact.  Deep tendon reflexes are intact.  Still has a bit of a hammertoe deformity though swelling is gone down considerably to the second digit of the left foot.  There is no longer any reproducible pain.  Assessment: Cannot rule out some type of neuropathy in her toe or in her foot in general.  Plan: Discussed etiology pathology conservative versus surgical therapies at this point I feel that the only other alternative other than an MRI which does not demonstrate anything would be a nerve conduction velocity exam and this will be ordered.

## 2017-03-21 ENCOUNTER — Telehealth: Payer: Self-pay | Admitting: Podiatry

## 2017-03-21 ENCOUNTER — Encounter: Payer: Self-pay | Admitting: Neurology

## 2017-03-21 DIAGNOSIS — M792 Neuralgia and neuritis, unspecified: Secondary | ICD-10-CM

## 2017-03-21 NOTE — Telephone Encounter (Signed)
I saw Dr. Milinda Pointer last Thursday and he was going to order a nerve conduction test for me. I have not heard anything from anyone about that. Also, I'm having more pain in my toes and foot with a tingling sensation. I wanted to know if there is a medication he would recommend and could send to my pharmacy. My call back number is [phone].

## 2017-03-21 NOTE — Addendum Note (Signed)
Addended by: Harriett Sine D on: 03/21/2017 12:06 PM   Modules accepted: Orders

## 2017-03-21 NOTE — Telephone Encounter (Signed)
Faxed orders to [facility]. 

## 2017-03-22 ENCOUNTER — Other Ambulatory Visit: Payer: Self-pay

## 2017-04-10 ENCOUNTER — Telehealth: Payer: Self-pay | Admitting: Podiatry

## 2017-04-10 ENCOUNTER — Other Ambulatory Visit: Payer: Self-pay | Admitting: *Deleted

## 2017-04-10 DIAGNOSIS — R200 Anesthesia of skin: Secondary | ICD-10-CM

## 2017-04-10 NOTE — Telephone Encounter (Signed)
Single extensor tenotymy.  Exactly what it says in the note.

## 2017-04-10 NOTE — Telephone Encounter (Signed)
I'm calling to get the exact name of the procedure Dr. Milinda Pointer performed on me in the office about 2 months ago. I need to know what exactly was done and what the procedure was called. If you would please give me a call back at [phone]. Thank you.

## 2017-04-11 ENCOUNTER — Ambulatory Visit (INDEPENDENT_AMBULATORY_CARE_PROVIDER_SITE_OTHER): Payer: Medicare Other | Admitting: Neurology

## 2017-04-11 DIAGNOSIS — R200 Anesthesia of skin: Secondary | ICD-10-CM

## 2017-04-11 NOTE — Telephone Encounter (Signed)
I informed pt of Dr. Stephenie Acres statement - single extensor tenotomy.

## 2017-04-11 NOTE — Procedures (Signed)
[facility]  [address]  [place], [place] [address] Tel: [phone] Fax:  [phone] Test Date:  04/11/2017  Patient: Anne [name] DOB: [birth year] Physician: Narda Amber, DO  Sex: Female Height: 5\' 7"  Ref Phys: Max T. [place], [place]  ID#: [id number] Temp: 35.8C Technician:    Patient Complaints: This is an 84 year-old female referred for evaluation of left foot pain.  NCV & EMG Findings: Extensive electrodiagnostic testing of the left lower extremity shows:  1. Left sural and superficial peroneal sensory responses are within normal limits. 2. Left peroneal and tibial motor responses are within normal limits. 3. Left tibial H reflex study is within normal limits. 4. There is no evidence of active or chronic motor axon loss changes affecting any of the tested muscles. Motor unit configuration and recruitment pattern is within normal limits.  Impression: This is a normal study of the left lower extremity. In particular, there is no evidence of a sensorimotor polyneuropathy or lumbosacral radiculopathy.   ___________________________ Narda Amber, DO    Nerve Conduction Studies Anti Sensory Summary Table   Stim Site NR Peak (ms) Norm Peak (ms) P-T Amp (V) Norm P-T Amp  Left Sup Peroneal Anti Sensory (Ant Lat Mall)  35.8C  12 cm    2.5 <4.6 5.2 >3  Left Sural Anti Sensory (Lat Mall)  35.8C  Calf    2.8 <4.6 7.9 >3   Motor Summary Table   Stim Site NR Onset (ms) Norm Onset (ms) O-P Amp (mV) Norm O-P Amp Site1 Site2 Delta-0 (ms) Dist (cm) Vel (m/s) Norm Vel (m/s)  Left Peroneal Motor (Ext Dig Brev)  35.8C  Ankle    2.7 <6.0 4.4 >2.5 B Fib Ankle 7.8 37.0 47 >40  B Fib    10.5  4.1  Poplt B Fib 1.6 9.0 56 >40  Poplt    12.1  4.0         Left Tibial Motor (Abd Hall Brev)  35.8C  Ankle    3.7 <6.0 10.1 >4 Knee Ankle 9.0 39.0 43 >40  Knee    12.7  7.8          H Reflex Studies   NR H-Lat (ms) Lat Norm (ms) L-R H-Lat (ms)  Left Tibial (Gastroc)   35.8C     31.02 <35    EMG   Side Muscle Ins Act Fibs Psw Fasc Number Recrt Dur Dur. Amp Amp. Poly Poly. Comment  Left AntTibialis Nml Nml Nml Nml Nml Nml Nml Nml Nml Nml Nml Nml N/A  Left Gastroc Nml Nml Nml Nml Nml Nml Nml Nml Nml Nml Nml Nml N/A  Left Flex Dig Long Nml Nml Nml Nml Nml Nml Nml Nml Nml Nml Nml Nml N/A  Left RectFemoris Nml Nml Nml Nml Nml Nml Nml Nml Nml Nml Nml Nml N/A  Left GluteusMed Nml Nml Nml Nml Nml Nml Nml Nml Nml Nml Nml Nml N/A  Left BicepsFemS Nml Nml Nml Nml Nml Nml Nml Nml Nml Nml Nml Nml N/A      Waveforms:

## 2017-04-15 ENCOUNTER — Telehealth: Payer: Self-pay | Admitting: *Deleted

## 2017-04-15 NOTE — Telephone Encounter (Signed)
I informed pt of Dr. Stephenie Acres review of results and recommendation for more sensitive testing with nerve biopsy. Pt states she will have to think about it and discuss with her PCP. Pt states her test where they took scrapings from her toenails came back normal too.

## 2017-04-15 NOTE — Telephone Encounter (Signed)
-----   Message from Garrel Ridgel, [place] sent at 04/13/2017  7:46 AM EST ----- Normal nerve conduction test.  She could come in for a nerve biopsy.  This is more sensitive than a nerve conduction velocity exam for localized peripheral neuropathy.

## 2017-04-18 ENCOUNTER — Telehealth: Payer: Self-pay | Admitting: Podiatry

## 2017-04-18 NOTE — Telephone Encounter (Signed)
Called pt to let her know that since we did not order or perform the test in our office she would have to get them from the office they were performed at. Pt stated she did not understand because she got records from [place] that was done at another facility about a month or so ago. After trying to explain to her the pt requested to speak to my supervisor so I transferred the pt to my supervisor Shelly.

## 2017-04-18 NOTE — Telephone Encounter (Signed)
I'm calling to request a copy of the nerve conduction test that I had done last week. I was told that the information was sent to Dr. Stephenie Acres office. I would like to pick up a copy and I can do that tomorrow. If there is a problem you have my number and you can give me a call. Thank you.

## 2017-04-24 ENCOUNTER — Telehealth: Payer: Self-pay | Admitting: Internal Medicine

## 2017-04-24 NOTE — Telephone Encounter (Signed)
Spoke with the pt  She states she has had a head cold for approx 2 wks  She was seen by her PCP and given doxy and pred  Her nasal symptoms improved, but now she is coughing and wheezing  OV with TP for tomorrow at 4:30 pm Advised her to seek emergent care sooner if needed

## 2017-04-25 ENCOUNTER — Encounter: Payer: Self-pay | Admitting: Adult Health

## 2017-04-25 ENCOUNTER — Ambulatory Visit: Payer: Medicare Other | Admitting: Adult Health

## 2017-04-25 ENCOUNTER — Ambulatory Visit (INDEPENDENT_AMBULATORY_CARE_PROVIDER_SITE_OTHER)
Admission: RE | Admit: 2017-04-25 | Discharge: 2017-04-25 | Disposition: A | Discharge status: Home / Self Care | Payer: Medicare Other | Source: Ambulatory Visit | Attending: Adult Health | Admitting: Adult Health

## 2017-04-25 VITALS — BP 138/60 | HR 69 | Ht 67.0 in | Wt 168.6 lb

## 2017-04-25 DIAGNOSIS — J449 Chronic obstructive pulmonary disease, unspecified: Secondary | ICD-10-CM

## 2017-04-25 MED ORDER — FLUTICASONE FUROATE-VILANTEROL 200-25 MCG/INH IN AEPB: 1.0000 | INHALATION_SPRAY | Freq: Every day | RESPIRATORY_TRACT | 0 refills | Status: DC

## 2017-04-25 MED ORDER — BENZONATATE 200 MG PO CAPS: 200.0000 mg | ORAL_CAPSULE | Freq: Three times a day (TID) | ORAL | 1 refills | Status: DC | PRN

## 2017-04-25 MED ORDER — LEVALBUTEROL HCL 0.63 MG/3ML IN NEBU
0.6300 mg | INHALATION_SOLUTION | Freq: Once | RESPIRATORY_TRACT | Status: AC
  Administered 2017-04-25: 0.63 mg via RESPIRATORY_TRACT

## 2017-04-25 NOTE — Patient Instructions (Signed)
Mucinex DM twice daily as needed for cough and congestion Tessalon 200 mg 3 times daily as needed for cough Saline nasal rinses as needed Increase BREO 200 1 puff daily until sample is complete then resume regular dose Chest x-ray today Follow up with Dr. Chase Caller in 2 months and As needed   Please contact office for sooner follow up if symptoms do not improve or worsen or seek emergency care

## 2017-04-25 NOTE — Assessment & Plan Note (Signed)
Mild flare with bronchitis.  Hold on additional antibiotics and steroids.  Check chest x-ray.  Xopenex nebulizer given in the office.  Increase BREO 200 until sample is completed and then resume regular dose.  Plan  Patient Instructions  Mucinex DM twice daily as needed for cough and congestion Tessalon 200 mg 3 times daily as needed for cough Saline nasal rinses as needed Increase BREO 200 1 puff daily until sample is complete then resume regular dose Chest x-ray today Follow up with Dr. Chase Caller in 2 months and As needed   Please contact office for sooner follow up if symptoms do not improve or worsen or seek emergency care

## 2017-04-25 NOTE — Progress Notes (Signed)
@Patient  ID: Anne [name], female    DOB: 28-Jan-1931, 84 y.o.   MRN: [id number]  Chief Complaint  Patient presents with  . Acute Visit    cough     Referring provider: Merlene Laughter, MD  HPI: 84 year old female never smoker followed for mild persistent asthma History of diastolic dysfunction Known chronic right middle lobe collapse/ lymphadenopathy -s/p 07/2007 FOB +psuedomonas  Test Exhaled nitric oxide test May 2018: 17 ppb High resolution CT chest August 2016 showed no evidence of interstitial lung disease, chronic collapse of right middle lobe MEdiastinaoscopy 07/27/2009 - Benign node of 4R and 4L. ANthracotoic pigment +. No granuoma. Non malignant PFT 10/28/2014 FEV1 94, ratio 54, FVC 135, no significant bronchodilator response, mid flows reversibility, DLCO 65%   04/25/2017 Acute OV : Asthma  Patient presents for an acute office visit.  She complains of 3 weeks of cough ,congestion sinus congestion, drainage , wheezing . Seen PCP dx w/ bronchitis , rx  , Doxycycline and Prednisone . Feels some better but still has chest congestion and tightness .  She remains on Breo daily.  Appetite is okay w/ no nv/d.  No chest pain, orthopnea or edema.     Allergies  Allergen Reactions  . Other Shortness Of Breath    Pt states she has asthma and seafood causes breathing problems.   . Shellfish Allergy Shortness Of Breath  . Iodine Other (See Comments)    REACTION: " UNSURE TOLD TO LIST AS ALLERGEN BY MD" REACTION: " UNSURE TOLD TO LIST AS ALLERGEN BY MD"  . Latex Itching  . Adhesive [Tape] Rash    Immunization History  Administered Date(s) Administered  . Influenza Whole 04/04/2009  . Influenza, High Dose Seasonal PF 12/31/2016  . Influenza,inj,Quad PF,6+ Mos 01/03/2015, 12/02/2015  . Influenza-Unspecified 02/14/2014  . Pneumococcal Polysaccharide-23 06/01/2003  . Pneumococcal-Unspecified 01/03/2015    Past Medical History:  Diagnosis Date  . Allergic rhinitis   .  Arthritis   . Asthma   . Atypical chest pain   . Collapse of right lung   . Dyslipidemia   . Gastroesophageal reflux disease   . Glaucoma   . Hematuria   . Hyperlipidemia   . Lung nodules     Tobacco History: Social History   Tobacco Use  Smoking Status Never Smoker  Smokeless Tobacco Never Used   Counseling given: Not Answered   Outpatient Encounter Medications as of 04/25/2017  Medication Sig  . albuterol (PROAIR HFA) 108 (90 Base) MCG/ACT inhaler Inhale 1-2 puffs into the lungs every 6 (six) hours as needed for wheezing or shortness of breath.  Marland Kitchen albuterol (PROVENTIL) (2.5 MG/3ML) 0.083% nebulizer solution Take 3 mLs (2.5 mg total) by nebulization every 6 (six) hours as needed for wheezing or shortness of breath.  Marland Kitchen aspirin EC 81 MG tablet Take 1 tablet (81 mg total) by mouth daily.  . Calcium Carb-Cholecalciferol (CALCIUM 600 + D) 600-200 MG-UNIT TABS Take 2 tablets by mouth daily.   . cetirizine (ZYRTEC) 10 MG tablet Take 10 mg by mouth every morning.  . fluticasone (FLONASE) 50 MCG/ACT nasal spray Place 1 spray into both nostrils 2 (two) times daily as needed for allergies.   . fluticasone furoate-vilanterol (BREO ELLIPTA) 100-25 MCG/INH AEPB Inhale 1 puff into the lungs daily.  Marland Kitchen latanoprost (XALATAN) 0.005 % ophthalmic solution Place 1 drop into both eyes at bedtime.  . Multiple Vitamin (MULTIVITAMIN) tablet Take 1 tablet by mouth every morning.   . TURMERIC PO Take 1  capsule by mouth daily.  . benzonatate (TESSALON) 200 MG capsule Take 1 capsule (200 mg total) by mouth 3 (three) times daily as needed for cough.  . fluticasone furoate-vilanterol (BREO ELLIPTA) 200-25 MCG/INH AEPB Inhale 1 puff into the lungs daily.   Facility-Administered Encounter Medications as of 04/25/2017  Medication  . [COMPLETED] levalbuterol (XOPENEX) nebulizer solution 0.63 mg  . regadenoson (LEXISCAN) injection SOLN 0.4 mg     Review of Systems  Constitutional:   No  weight loss, night  sweats,  Fevers, chills, fatigue, or  lassitude.  HEENT:   No headaches,  Difficulty swallowing,  Tooth/dental problems, or  Sore throat,                No sneezing, itching, ear ache +, nasal congestion, post nasal drip,   CV:  No chest pain,  Orthopnea, PND, swelling in lower extremities, anasarca, dizziness, palpitations, syncope.   GI  No heartburn, indigestion, abdominal pain, nausea, vomiting, diarrhea, change in bowel habits, loss of appetite, bloody stools.   Resp:    No chest wall deformity  Skin: no rash or lesions.  GU: no dysuria, change in color of urine, no urgency or frequency.  No flank pain, no hematuria   MS:  No joint pain or swelling.  No decreased range of motion.  No back pain.    Physical Exam  BP 138/60 (BP Location: Left Arm, Cuff Size: Normal)   Pulse 69   Ht 5\' 7"  (1.702 m)   Wt 168 lb 9.6 oz (76.5 kg)   SpO2 99%   BMI 26.41 kg/m   GEN: A/Ox3; pleasant , NAD, elderly    HEENT:  [place]/AT,  EACs-clear, TMs-wnl, NOSE-clear drainage , THROAT-clear, no lesions, no postnasal drip or exudate noted.   NECK:  Supple w/ fair ROM; no JVD; normal carotid impulses w/o bruits; no thyromegaly or nodules palpated; no lymphadenopathy.    RESP  Clear  P & A; w/o, wheezes/ rales/ or rhonchi. no accessory muscle use, no dullness to percussion  CARD:  RRR, no m/r/g, no peripheral edema, pulses intact, no cyanosis or clubbing.  GI:   Soft & nt; nml bowel sounds; no organomegaly or masses detected.   Musco: Warm bil, no deformities or joint swelling noted.   Neuro: alert, no focal deficits noted.    Skin: Warm, no lesions or rashes    Lab Results:  CBC    Component Value Date/Time   WBC 7.0 01/14/2017 1453   RBC 3.96 01/14/2017 1453   HGB 12.3 01/14/2017 1453   HCT 36.9 01/14/2017 1453   PLT 290.0 01/14/2017 1453   MCV 93.2 01/14/2017 1453   MCH 30.3 03/17/2015 1120   MCHC 33.3 01/14/2017 1453   RDW 14.4 01/14/2017 1453   LYMPHSABS 2.4 01/14/2017 1453     MONOABS 0.7 01/14/2017 1453   EOSABS 0.2 01/14/2017 1453   BASOSABS 0.1 01/14/2017 1453    BMET    Component Value Date/Time   NA 137 01/14/2017 1453   K 4.4 01/14/2017 1453   CL 103 01/14/2017 1453   CO2 25 01/14/2017 1453   GLUCOSE 101 (H) 01/14/2017 1453   BUN 25 (H) 01/14/2017 1453   CREATININE 1.17 01/14/2017 1453   CALCIUM 8.9 01/14/2017 1453   GFRNONAA 52 (L) 03/17/2015 1120   GFRAA >60 03/17/2015 1120    BNP    Component Value Date/Time   BNP 103.9 (H) 03/17/2015 1120    ProBNP    Component Value Date/Time  PROBNP 65.0 01/14/2017 1453    Imaging: No results found.   Assessment & Plan:   Asthma Mild flare with bronchitis.  Hold on additional antibiotics and steroids.  Check chest x-ray.  Xopenex nebulizer given in the office.  Increase BREO 200 until sample is completed and then resume regular dose.  Plan  Patient Instructions  Mucinex DM twice daily as needed for cough and congestion Tessalon 200 mg 3 times daily as needed for cough Saline nasal rinses as needed Increase BREO 200 1 puff daily until sample is complete then resume regular dose Chest x-ray today Follow up with Dr. Marchelle Gearing in 2 months and As needed   Please contact office for sooner follow up if symptoms do not improve or worsen or seek emergency care          Rubye Oaks, NP 04/25/2017

## 2017-04-26 ENCOUNTER — Telehealth: Payer: Self-pay | Admitting: Adult Health

## 2017-04-26 NOTE — Telephone Encounter (Signed)
Notes recorded by Melvenia Needles, NP on 04/26/2017 at 4:27 PM EST CXR ok , no sign of PNA  Cont w/ ov rec Please contact office for sooner follow up if symptoms do not improve or worsen or seek emergency care   Advised pt of results. Pt understood and nothing further is needed.

## 2017-05-08 ENCOUNTER — Telehealth: Payer: Self-pay | Admitting: Podiatry

## 2017-05-08 NOTE — Telephone Encounter (Signed)
Left message for pt to contact me concerning the information she was requesting on the MRI.

## 2017-05-08 NOTE — Telephone Encounter (Signed)
I'm a pt of Dr. Stephenie Acres. I was in for the results of the MRI I had done on my toe. I wanted him to know that I'm still having problems with pain in that particular toe. When I went in to talk about the MRI results it mentioned on there about a subchondral cyst formation in the head of the first metatarsal. We did not discuss that and I just noticed that in the report. Would you ask him to look at that and tell me what exactly that has reference to, wether I have a cyst at this referenced part or what is that really saying? My telephone number here is [phone]. Thank you.

## 2017-05-08 NOTE — Telephone Encounter (Signed)
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  This is not associated with the second toe.

## 2017-05-09 NOTE — Telephone Encounter (Signed)
I informed pt of Dr. Stephenie Acres explanation of the cyst on the 1st MPJ, being related to the arthritis and DJD. Pt states that maybe that is why her 2nd toe hurts and I told her if she needed to discuss with Dr. Milinda Pointer she should make an appt since her last appt was 03/14/2017. Pt states she she will have to think about it.

## 2017-05-15 ENCOUNTER — Other Ambulatory Visit: Payer: Self-pay | Admitting: Acute Care

## 2017-05-15 ENCOUNTER — Other Ambulatory Visit: Payer: Self-pay | Admitting: Adult Health

## 2017-05-15 MED ORDER — FLUTICASONE FUROATE-VILANTEROL 200-25 MCG/INH IN AEPB: 1.0000 | INHALATION_SPRAY | Freq: Every day | RESPIRATORY_TRACT | 2 refills | Status: DC

## 2017-05-28 DIAGNOSIS — B3781 Candidal esophagitis: Secondary | ICD-10-CM

## 2017-05-28 DIAGNOSIS — B370 Candidal stomatitis: Secondary | ICD-10-CM | POA: Insufficient documentation

## 2017-05-28 DIAGNOSIS — R070 Pain in throat: Secondary | ICD-10-CM | POA: Insufficient documentation

## 2017-05-29 DIAGNOSIS — R52 Pain, unspecified: Secondary | ICD-10-CM | POA: Insufficient documentation

## 2017-05-30 ENCOUNTER — Telehealth: Payer: Self-pay | Admitting: Internal Medicine

## 2017-05-30 NOTE — Telephone Encounter (Signed)
Spoke with MR. He advised [place] to check on patient to see if she had any symptoms and make sure that the mold removal process has been started ASAP.   Spoke with patient, she stated that she is feeling fine. She will not sleep in the area where the mold is. The mold removal was actually able to begin this afternoon so she is happy.   Nothing else needed at time of call.

## 2017-05-30 NOTE — Telephone Encounter (Signed)
Pt returned our call and stated the installer of her house found mold in her bathroom.  With pt having asthma, she is wanting to know if it will be safe for her to be in the area due to it being the bathroom that is attached to her bathroom.  Pt stated that there was mold discovered adjacent with a closet as well as in the bathroom and had been replacing the shower when the mold was discovered.  She said that it will be awhile before the mold will be able to be fully taken care of.  Due to pt having asthma, she is wanting to know what precautions she should take regarding this.  MR, please advise for pt. Thanks!

## 2017-05-30 NOTE — Telephone Encounter (Signed)
lmomtcb x1 

## 2017-06-04 ENCOUNTER — Other Ambulatory Visit: Payer: Self-pay | Admitting: Acute Care

## 2017-06-05 ENCOUNTER — Encounter: Payer: Self-pay | Admitting: Internal Medicine

## 2017-06-05 ENCOUNTER — Telehealth: Payer: Self-pay | Admitting: *Deleted

## 2017-06-05 ENCOUNTER — Ambulatory Visit: Payer: Medicare Other | Admitting: Internal Medicine

## 2017-06-05 VITALS — BP 120/64 | HR 64 | Ht 67.0 in | Wt 169.6 lb

## 2017-06-05 DIAGNOSIS — J4530 Mild persistent asthma, uncomplicated: Secondary | ICD-10-CM

## 2017-06-05 DIAGNOSIS — J449 Chronic obstructive pulmonary disease, unspecified: Secondary | ICD-10-CM

## 2017-06-05 DIAGNOSIS — R0602 Shortness of breath: Secondary | ICD-10-CM

## 2017-06-05 LAB — NITRIC OXIDE: NITRIC OXIDE: 20

## 2017-06-05 MED ORDER — FLUTICASONE FUROATE-VILANTEROL 100-25 MCG/INH IN AEPB: 1.0000 | INHALATION_SPRAY | Freq: Every day | RESPIRATORY_TRACT | 0 refills | Status: DC

## 2017-06-05 MED ORDER — GABAPENTIN 100 MG PO CAPS: 100.0000 mg | ORAL_CAPSULE | Freq: Three times a day (TID) | ORAL | 2 refills | Status: DC

## 2017-06-05 NOTE — Patient Instructions (Addendum)
ICD-10-CM     J44.9 Nitric oxide  2. Mild persistent asthma without complication R97.58   3. Shortness of breath R06.02     Asthma under control No overt damage from recent mold exposure Shortness of breath likely due to known mild stiffness in heart muscle  + loss of fitness with left hammer toe issues  Plan Continue breo Slowly aim for fitness  Followup 6 months or sooner if needed

## 2017-06-05 NOTE — Telephone Encounter (Signed)
Refill request for Gabapentin. Dr. Amalia Hailey states refill Gabapentin 100mg  #90 as ordered 08/06/2016.

## 2017-06-05 NOTE — Progress Notes (Signed)
Subjective:     Patient ID: Anne [name], female   DOB: 22-Mar-1931, 84 y.o.   MRN: [id number]  HPI Chief Complaint  Patient presents with  . Acute Visit    cough     Referring provider: Merlene Laughter, MD  HPI: 84 year old female never smoker followed for mild persistent asthma History of diastolic dysfunction Known chronic right middle lobe collapse/ lymphadenopathy -s/p 07/2007 FOB +psuedomonas  Test Exhaled nitric oxide test May 2018: 17 ppb High resolution CT chest August 2016 showed no evidence of interstitial lung disease, chronic collapse of right middle lobe MEdiastinaoscopy 07/27/2009 - Benign node of 4R and 4L. ANthracotoic pigment +. No granuoma. Non malignant PFT 10/28/2014 FEV1 94, ratio 54, FVC 135, no significant bronchodilator response, mid flows reversibility, DLCO 65%   04/25/2017 Acute OV : Asthma  Patient presents for an acute office visit.  She complains of 3 weeks of cough ,congestion sinus congestion, drainage , wheezing . Seen PCP dx w/ bronchitis , rx  , Doxycycline and Prednisone . Feels some better but still has chest congestion and tightness .  She remains on Breo daily.    Appetite is okay w/ no nv/d.  No chest pain, orthopnea or ede   OV 06/05/2017  Chief Complaint  Patient presents with  . Follow-up    Pt states she has been doing good since last visit. Pt states she does become SOB with exertion. Denies any cough or CP.    Fu Mild persiostent asthma with associated diastolic dysfunction  Contines on broe. Overall stable. But last  4 month has has had 2 surgeries for hammer toe on left food. So less active. Correlating with this more dyspnea on exertion than usual. Also, found to have mold in the bathroom and professionally removed . Has not affected her but she is asking for feno and walk test both of which aare normal. She wants sample breo    Results for Anne, [name] (MRN [id number]) as of 06/05/2017 14:14  Ref. Range 08/07/2016 12:01  02/12/2017 11:30 06/05/2017 13:04  Nitric Oxide Unknown 17 18 20       has a past medical history of Allergic rhinitis, Arthritis, Asthma, Atypical chest pain, Collapse of right lung, Dyslipidemia, Gastroesophageal reflux disease, Glaucoma, Hematuria, Hyperlipidemia, and Lung nodules.   reports that  has never smoked. she has never used smokeless tobacco.  Past Surgical History:  Procedure Laterality Date  . ANAL FISSURE REPAIR    . BREAST BIOPSY Right   . CATARACT EXTRACTION Bilateral   . MASS EXCISION  04/11/2012   Procedure: EXCISION MASS;  Surgeon: Nicki Reaper, MD;  Location: [facility];  Service: Orthopedics;  Laterality: Right;  Excision of mass right upper arm  . MEDIASTINOSCOPY  4/11  . NASAL SINUS SURGERY    . TOTAL ABDOMINAL HYSTERECTOMY W/ BILATERAL SALPINGOOPHORECTOMY      Allergies  Allergen Reactions  . Other Shortness Of Breath    Pt states she has asthma and seafood causes breathing problems.   . Shellfish Allergy Shortness Of Breath  . Iodine Other (See Comments)    REACTION: " UNSURE TOLD TO LIST AS ALLERGEN BY MD" REACTION: " UNSURE TOLD TO LIST AS ALLERGEN BY MD"  . Latex Itching  . Adhesive [Tape] Rash    Immunization History  Administered Date(s) Administered  . Influenza Whole 04/04/2009  . Influenza, High Dose Seasonal PF 12/31/2016  . Influenza,inj,Quad PF,6+ Mos 01/03/2015, 12/02/2015  . Influenza-Unspecified 02/14/2014  . Pneumococcal Polysaccharide-23  06/01/2003  . Pneumococcal-Unspecified 01/03/2015    Family History  Problem Relation Age of Onset  . Cancer Mother   . Heart disease Father   . Breast cancer Cousin   . Heart attack Neg Hx      Current Outpatient Medications:  .  albuterol (PROAIR HFA) 108 (90 Base) MCG/ACT inhaler, Inhale 1-2 puffs into the lungs every 6 (six) hours as needed for wheezing or shortness of breath., Disp: 1 Inhaler, Rfl: 3 .  albuterol (PROVENTIL) (2.5 MG/3ML) 0.083% nebulizer solution, Take 3  mLs (2.5 mg total) by nebulization every 6 (six) hours as needed for wheezing or shortness of breath., Disp: 75 mL, Rfl: 12 .  aspirin EC 81 MG tablet, Take 1 tablet (81 mg total) by mouth daily., Disp: 90 tablet, Rfl: 3 .  BREO ELLIPTA 100-25 MCG/INH AEPB, TAKE 1 PUFF BY MOUTH EVERY DAY, Disp: 60 each, Rfl: 3 .  Calcium Carb-Cholecalciferol (CALCIUM 600 + D) 600-200 MG-UNIT TABS, Take 2 tablets by mouth daily. , Disp: , Rfl:  .  cetirizine (ZYRTEC) 10 MG tablet, Take 10 mg by mouth every morning., Disp: , Rfl:  .  fluticasone (FLONASE) 50 MCG/ACT nasal spray, Place 1 spray into both nostrils 2 (two) times daily as needed for allergies. , Disp: , Rfl:  .  latanoprost (XALATAN) 0.005 % ophthalmic solution, Place 1 drop into both eyes at bedtime., Disp: , Rfl:  .  Multiple Vitamin (MULTIVITAMIN) tablet, Take 1 tablet by mouth every morning. , Disp: , Rfl:  .  POTASSIUM PO, Take 1 tablet by mouth daily., Disp: , Rfl:  .  TURMERIC PO, Take 1 capsule by mouth daily., Disp: , Rfl:  .  vitamin B-12 (CYANOCOBALAMIN) 1000 MCG tablet, Take by mouth., Disp: , Rfl:  .  benzonatate (TESSALON) 200 MG capsule, Take 1 capsule (200 mg total) by mouth 3 (three) times daily as needed for cough. (Patient not taking: Reported on 06/05/2017), Disp: 30 capsule, Rfl: 1 No current facility-administered medications for this visit.   Facility-Administered Medications Ordered in Other Visits:  .  regadenoson (LEXISCAN) injection SOLN 0.4 mg, 0.4 mg, Intravenous, Once, Turner, Cornelious Bryant, MD   Review of Systems     Objective:   Physical Exam  Constitutional: She is oriented to person, place, and time. She appears well-developed and well-nourished. No distress.  HENT:  Head: Normocephalic and atraumatic.  Right Ear: External ear normal.  Left Ear: External ear normal.  Mouth/Throat: Oropharynx is clear and moist. No oropharyngeal exudate.  Eyes: Conjunctivae and EOM are normal. Pupils are equal, round, and reactive to  light. Right eye exhibits no discharge. Left eye exhibits no discharge. No scleral icterus.  Neck: Normal range of motion. Neck supple. No JVD present. No tracheal deviation present. No thyromegaly present.  Cardiovascular: Normal rate, regular rhythm, normal heart sounds and intact distal pulses. Exam reveals no gallop and no friction rub.  No murmur heard. Pulmonary/Chest: Effort normal and breath sounds normal. No respiratory distress. She has no wheezes. She has no rales. She exhibits no tenderness.  Abdominal: Soft. Bowel sounds are normal. She exhibits no distension and no mass. There is no tenderness. There is no rebound and no guarding.  Musculoskeletal: Normal range of motion. She exhibits no edema or tenderness.  Lymphadenopathy:    She has no cervical adenopathy.  Neurological: She is alert and oriented to person, place, and time. She has normal reflexes. No cranial nerve deficit. She exhibits normal muscle tone. Coordination normal.  Skin:  Skin is warm and dry. No rash noted. She is not diaphoretic. No erythema. No pallor.  Psychiatric: She has a normal mood and affect. Her behavior is normal. Judgment and thought content normal.  Vitals reviewed.  Vitals:   06/05/17 1201  BP: 120/64  Pulse: 64  SpO2: 99%  Weight: 169 lb 9.6 oz (76.9 kg)  Height: 5\' 7"  (1.702 m)    Estimated body mass index is 26.56 kg/m as calculated from the following:   Height as of this encounter: 5\' 7"  (1.702 m).   Weight as of this encounter: 169 lb 9.6 oz (76.9 kg).     Assessment:               2. Mild persistent asthma without complication J45.30   3. Shortness of breath R06.02        Plan:       Asthma under control No overt damage from recent mold exposure Shortness of breath likely due to known mild stiffness in heart muscle  + loss of fitness with left hammer toe issues  Plan Continue breo Slowly aim for fitness  Followup 6 months or sooner if needed   Dr. Kalman Shan, M.D., [facility].C.P Pulmonary and Critical Care Medicine Staff Physician, [facility] Director - Interstitial Lung Disease  Program  Pulmonary Fibrosis [facility] [place], [place], [address]  Pager: [phone], If no answer or between  15:00h - 7:00h: call [phone] Telephone: [phone]

## 2017-06-05 NOTE — Addendum Note (Signed)
Addended by: Valerie Salts on: 06/05/2017 02:47 PM   Modules accepted: Orders

## 2017-06-18 ENCOUNTER — Other Ambulatory Visit: Payer: Self-pay | Admitting: Orthopedic Surgery

## 2017-06-18 DIAGNOSIS — R229 Localized swelling, mass and lump, unspecified: Principal | ICD-10-CM

## 2017-06-18 DIAGNOSIS — IMO0002 Reserved for concepts with insufficient information to code with codable children: Secondary | ICD-10-CM

## 2017-07-01 ENCOUNTER — Ambulatory Visit
Admission: RE | Admit: 2017-07-01 | Discharge: 2017-07-01 | Disposition: A | Discharge status: Home / Self Care | Payer: Medicare Other | Source: Ambulatory Visit | Attending: Orthopedic Surgery | Admitting: Orthopedic Surgery

## 2017-07-01 ENCOUNTER — Inpatient Hospital Stay
Admission: RE | Admit: 2017-07-01 | Discharge: 2017-07-01 | Disposition: A | Discharge status: Home / Self Care | Payer: Medicare Other | Source: Ambulatory Visit | Attending: Orthopedic Surgery | Admitting: Orthopedic Surgery

## 2017-07-01 DIAGNOSIS — R229 Localized swelling, mass and lump, unspecified: Principal | ICD-10-CM

## 2017-07-01 DIAGNOSIS — IMO0002 Reserved for concepts with insufficient information to code with codable children: Secondary | ICD-10-CM

## 2017-07-01 MED ORDER — GADOBENATE DIMEGLUMINE 529 MG/ML IV SOLN
14.0000 mL | Freq: Once | INTRAVENOUS | Status: AC | PRN
  Administered 2017-07-01: 14 mL via INTRAVENOUS

## 2017-07-15 ENCOUNTER — Ambulatory Visit: Payer: Medicare Other | Admitting: Neurology

## 2017-07-15 ENCOUNTER — Encounter: Payer: Self-pay | Admitting: Neurology

## 2017-07-15 VITALS — BP 112/56 | HR 82 | Ht 67.5 in | Wt 167.0 lb

## 2017-07-15 DIAGNOSIS — M79675 Pain in left toe(s): Secondary | ICD-10-CM

## 2017-07-15 DIAGNOSIS — G8929 Other chronic pain: Secondary | ICD-10-CM

## 2017-07-15 MED ORDER — GABAPENTIN 300 MG PO CAPS: 300.0000 mg | ORAL_CAPSULE | Freq: Every day | ORAL | 5 refills | Status: DC

## 2017-07-15 NOTE — Progress Notes (Signed)
[facility] Clinic Note - Initial Visit   Date: 07/15/17  Anne [name] MRN: [id number] DOB: 1931/02/15   Dear Dr. Pete Glatter:  Thank you for your kind referral of Anne [name] for consultation of left toe pain. Although her history is well known to you, please allow [place] to reiterate it for the purpose of our medical record. The patient was accompanied to the clinic by self.   History of Present Illness: Anne [name] is a 84 y.o. right-handed African American female with asthma, GERD, hyperlipidemia,  presenting for evaluation of left second toe pain.    Since early 02-27-17, she has intermittent stabbing pain over the tip of her left second toe, which occurs sporadically especially at night. Pain is worse with standing or wearing constrictive shoes.  She also complains of dull pain over the medial lower leg and ankle.  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  Unfortunately, she did not have any relief of her foot pain.  She has tried ibuprofen, Aleve, and gabapentin without any relief. MRI left foot which shows nonspecific soft tissue in the second toe with possible postsurgical changes in the flexor digitorum longus tendon. NCS/EMG was normal and did not show evidence of neuropathy or lumbosacral radiculopathy.   She does not have history of diabetes.  She denies any weakness or imbalance and continues to walk unassisted.  She lives alone in a one-level home; her husband passed away in 27-Feb-2017.   Out-side paper records, electronic medical record, and images have been reviewed where available and summarized as:  NCS/EMG of the left lower extremity 04/11/2017:  This is a normal study of the left lower extremity. In particular, there is no evidence of a sensorimotor polyneuropathy or lumbosacral radiculopathy.  MRI left foot 02/27/17: 1. Mild nonspecific soft tissue edema in  the second toe with possible postsurgical changes in the flexor digitorum longus tendon. 2. No significant arthropathic changes or acute osseous findings in the second toe. There are mild hammertoe deformities of the digits.  3. Moderate degenerative changes at the first metatarsal phalangeal joint.   Past Medical History:  Diagnosis Date  . Allergic rhinitis   . Arthritis   . Asthma   . Atypical chest pain   . Collapse of right lung   . Dyslipidemia   . Gastroesophageal reflux disease   . Glaucoma   . Hematuria   . Hyperlipidemia   . Lung nodules     Past Surgical History:  Procedure Laterality Date  . ANAL FISSURE REPAIR    . BREAST BIOPSY Right   . CATARACT EXTRACTION Bilateral   . MASS EXCISION  04/11/2012   Procedure: EXCISION MASS;  Surgeon: Nicki Reaper, MD;  Location: [facility];  Service: Orthopedics;  Laterality: Right;  Excision of mass right upper arm  . MEDIASTINOSCOPY  4/11  . NASAL SINUS SURGERY    . TOTAL ABDOMINAL HYSTERECTOMY W/ BILATERAL SALPINGOOPHORECTOMY       Medications:  Outpatient Encounter Medications as of 07/15/2017  Medication Sig  . albuterol (PROAIR HFA) 108 (90 Base) MCG/ACT inhaler Inhale 1-2 puffs into the lungs every 6 (six) hours as needed for wheezing or shortness of breath.  Marland Kitchen albuterol (PROVENTIL) (2.5 MG/3ML) 0.083% nebulizer solution Take 3 mLs (2.5 mg total) by nebulization every 6 (six) hours as needed for wheezing or shortness of breath.  Marland Kitchen aspirin EC  81 MG tablet Take 1 tablet (81 mg total) by mouth daily.  Marland Kitchen BREO ELLIPTA 100-25 MCG/INH AEPB TAKE 1 PUFF BY MOUTH EVERY DAY  . Calcium Carb-Cholecalciferol (CALCIUM 600 + D) 600-200 MG-UNIT TABS Take 2 tablets by mouth daily.   . cetirizine (ZYRTEC) 10 MG tablet Take 10 mg by mouth every morning.  . fluticasone (FLONASE) 50 MCG/ACT nasal spray Place 1 spray into both nostrils 2 (two) times daily as needed for allergies.   . fluticasone furoate-vilanterol (BREO ELLIPTA)  100-25 MCG/INH AEPB Inhale 1 puff into the lungs daily.  Marland Kitchen latanoprost (XALATAN) 0.005 % ophthalmic solution Place 1 drop into both eyes at bedtime.  . Multiple Vitamin (MULTIVITAMIN) tablet Take 1 tablet by mouth every morning.   . TURMERIC PO Take 1 capsule by mouth daily.  . vitamin B-12 (CYANOCOBALAMIN) 1000 MCG tablet Take by mouth.  . gabapentin (NEURONTIN) 300 MG capsule Take 1 capsule (300 mg total) by mouth at bedtime.  . [DISCONTINUED] benzonatate (TESSALON) 200 MG capsule Take 1 capsule (200 mg total) by mouth 3 (three) times daily as needed for cough. (Patient not taking: Reported on 06/05/2017)  . [DISCONTINUED] gabapentin (NEURONTIN) 100 MG capsule Take 1 capsule (100 mg total) by mouth 3 (three) times daily.  . [DISCONTINUED] POTASSIUM PO Take 1 tablet by mouth daily.   Facility-Administered Encounter Medications as of 07/15/2017  Medication  . regadenoson (LEXISCAN) injection SOLN 0.4 mg     Allergies:  Allergies  Allergen Reactions  . Other Shortness Of Breath    Pt states she has asthma and seafood causes breathing problems.   . Shellfish Allergy Shortness Of Breath  . Iodine Other (See Comments)    REACTION: " UNSURE TOLD TO LIST AS ALLERGEN BY MD" REACTION: " UNSURE TOLD TO LIST AS ALLERGEN BY MD"  . Latex Itching  . Adhesive [Tape] Rash    Family History: Family History  Problem Relation Age of Onset  . Cancer Mother   . Heart disease Father   . Breast cancer Cousin   . Heart attack Neg Hx     Social History: Social History   Tobacco Use  . Smoking status: Never Smoker  . Smokeless tobacco: Never Used  Substance Use Topics  . Alcohol use: No    Alcohol/week: 0.0 oz  . Drug use: No   Social History   Social History Narrative  . Not on file    Review of Systems:  CONSTITUTIONAL: No fevers, chills, night sweats, or weight loss.   EYES: No visual changes or eye pain ENT: No hearing changes.  No history of nose bleeds.   RESPIRATORY: No cough,  wheezing and shortness of breath.   CARDIOVASCULAR: Negative for chest pain, and palpitations.   GI: Negative for abdominal discomfort, blood in stools or black stools.  No recent change in bowel habits.   GU:  No history of incontinence.   MUSCLOSKELETAL: +history of joint pain or swelling.  No myalgias.   SKIN: Negative for lesions, rash, and itching.   HEMATOLOGY/ONCOLOGY: Negative for prolonged bleeding, bruising easily, and swollen nodes.  No history of cancer.   ENDOCRINE: Negative for cold or heat intolerance, polydipsia or goiter.   PSYCH:  No depression or anxiety symptoms.   NEURO: As Above.   Vital Signs:  BP (!) 112/56   Pulse 82   Ht 5' 7.5" (1.715 m)   Wt 167 lb (75.8 kg)   SpO2 96%   BMI 25.77 kg/m    General  Medical Exam:   General:  Well appearing, comfortable.   Eyes/ENT: see cranial nerve examination.   Neck: No masses appreciated.  Full range of motion without tenderness.  No carotid bruits. Respiratory:  Clear to auscultation, good air entry bilaterally.   Cardiac:  Regular rate and rhythm, no murmur.   Extremities:  No deformities, edema, or skin discoloration.  Skin:  No rashes or lesions.  Neurological Exam: MENTAL STATUS including orientation to time, place, person, recent and remote memory, attention span and concentration, language, and fund of knowledge is normal.  Speech is not dysarthric.  CRANIAL NERVES: II:  No visual field defects.  Unremarkable fundi.   III-IV-VI: Pupils equal round and reactive to light.  Normal conjugate, extra-ocular eye movements in all directions of gaze.  No nystagmus.  No ptosis.   V:  Normal facial sensation.     VII:  Normal facial symmetry and movements.  No pathologic facial reflexes.  VIII:  Normal hearing and vestibular function.   IX-X:  Normal palatal movement.   XI:  Normal shoulder shrug and head rotation.   XII:  Normal tongue strength and range of motion, no deviation or fasciculation.  MOTOR:  No  atrophy, fasciculations or abnormal movements.  No pronator drift.  Tone is normal.    Right Upper Extremity:    Left Upper Extremity:    Deltoid  5/5   Deltoid  5/5   Biceps  5/5   Biceps  5/5   Triceps  5/5   Triceps  5/5   Wrist extensors  5/5   Wrist extensors  5/5   Wrist flexors  5/5   Wrist flexors  5/5   Finger extensors  5/5   Finger extensors  5/5   Finger flexors  5/5   Finger flexors  5/5   Dorsal interossei  5/5   Dorsal interossei  5/5   Abductor pollicis  5/5   Abductor pollicis  5/5   Tone (Ashworth scale)  0  Tone (Ashworth scale)  0   Right Lower Extremity:    Left Lower Extremity:    Hip flexors  5/5   Hip flexors  5/5   Hip extensors  5/5   Hip extensors  5/5   Knee flexors  5/5   Knee flexors  5/5   Knee extensors  5/5   Knee extensors  5/5   Dorsiflexors  5/5   Dorsiflexors  5/5   Plantarflexors  5/5   Plantarflexors  5/5   Toe extensors  5/5   Toe extensors  5/5   Toe flexors  5/5   Toe flexors  5/5   Tone (Ashworth scale)  0  Tone (Ashworth scale)  0   MSRs:  Right                                                                 Left brachioradialis 2+  brachioradialis 2+  biceps 2+  biceps 2+  triceps 2+  triceps 2+  patellar 2+  patellar 2+  ankle jerk 2+  ankle jerk 2+  Hoffman no  Hoffman no  plantar response down  plantar response down   SENSORY:  Normal and symmetric perception of light touch, pinprick, vibration, and proprioception.  Romberg's sign absent.  COORDINATION/GAIT: Normal finger-to- nose-finger and heel-to-shin.  Intact rapid alternating movements bilaterally.  Able to rise from a chair without using arms.  Gait narrow based and stable. Tandem and stressed gait intact.    IMPRESSION: Chronic left second toe pain.  No evidence of neuropathy on exam or NCS/EMG.  She does have hammertoe and possible that she has orthopeadic changes causing her pain and may be developing chronic pain vs RSD-like pain.  I reviewed her NCS/EMG which was  normal.  At this juncture, recommend optimizing pain control and offered a higher dose of gabapentin.  She was previously of gabapentin 100mg  twice daily, which she took for a few days and stopped due to no relief.  She did not have side effects, so will offer gabapentin 300mg  at bedtime, which can be further titrated.    Return to clinic as needed  Thank you for allowing me to participate in patient's care.  If I can answer any additional questions, I would be pleased to do so.    Sincerely,    Imoni Kohen K. Allena Katz, DO

## 2017-07-15 NOTE — Patient Instructions (Addendum)
You can start gabapentin 300mg  at bedtime to see if that helps   Return to clinic as needed

## 2017-07-22 ENCOUNTER — Other Ambulatory Visit: Payer: Self-pay | Admitting: *Deleted

## 2017-07-22 MED ORDER — GABAPENTIN 300 MG PO CAPS: 300.0000 mg | ORAL_CAPSULE | Freq: Every day | ORAL | 3 refills | Status: DC

## 2017-08-13 ENCOUNTER — Other Ambulatory Visit: Payer: Self-pay | Admitting: Obstetrics and Gynecology

## 2017-08-13 DIAGNOSIS — Z1231 Encounter for screening mammogram for malignant neoplasm of breast: Secondary | ICD-10-CM

## 2017-09-13 ENCOUNTER — Ambulatory Visit
Admission: RE | Admit: 2017-09-13 | Discharge: 2017-09-13 | Disposition: A | Discharge status: Home / Self Care | Payer: Medicare Other | Source: Ambulatory Visit | Attending: Obstetrics and Gynecology | Admitting: Obstetrics and Gynecology

## 2017-09-13 DIAGNOSIS — Z1231 Encounter for screening mammogram for malignant neoplasm of breast: Secondary | ICD-10-CM

## 2017-10-08 ENCOUNTER — Telehealth: Payer: Self-pay | Admitting: Neurology

## 2017-10-08 NOTE — Telephone Encounter (Signed)
Spoke with Lattie Haw at [facility].  Pt has 3 refills on hold.

## 2017-10-08 NOTE — Telephone Encounter (Signed)
LMOM for pt advising of refills available.

## 2017-10-08 NOTE — Telephone Encounter (Signed)
Patient called and would like to a prescription for Gabapentin called in for her to [facility] on [address]. She said her condition is worse. Please Call. Thanks

## 2017-10-12 ENCOUNTER — Other Ambulatory Visit: Payer: Self-pay | Admitting: Internal Medicine

## 2017-11-07 ENCOUNTER — Telehealth (INDEPENDENT_AMBULATORY_CARE_PROVIDER_SITE_OTHER): Payer: Self-pay

## 2017-11-07 ENCOUNTER — Encounter (INDEPENDENT_AMBULATORY_CARE_PROVIDER_SITE_OTHER): Payer: Self-pay | Admitting: Orthopedic Surgery

## 2017-11-07 ENCOUNTER — Ambulatory Visit (INDEPENDENT_AMBULATORY_CARE_PROVIDER_SITE_OTHER): Payer: Medicare Other | Admitting: Orthopedic Surgery

## 2017-11-07 VITALS — Ht 67.0 in | Wt 167.0 lb

## 2017-11-07 DIAGNOSIS — M205X2 Other deformities of toe(s) (acquired), left foot: Secondary | ICD-10-CM

## 2017-11-07 DIAGNOSIS — M6702 Short Achilles tendon (acquired), left ankle: Secondary | ICD-10-CM | POA: Diagnosis not present

## 2017-11-14 ENCOUNTER — Telehealth (INDEPENDENT_AMBULATORY_CARE_PROVIDER_SITE_OTHER): Payer: Self-pay | Admitting: Orthopedic Surgery

## 2017-11-14 NOTE — Telephone Encounter (Signed)
Patient called asked for a call back concerning the exercises she was told to do. Patient is not sure she is doing the exercises correct. Patient said what she is doing is causing the bottom of her foot to hurt. The number to contact patient is [phone]

## 2017-11-15 ENCOUNTER — Telehealth (INDEPENDENT_AMBULATORY_CARE_PROVIDER_SITE_OTHER): Payer: Self-pay

## 2017-11-15 NOTE — Telephone Encounter (Signed)
Pt described heel chord stretching exercises and states that she has purchased the [place] shoes as instructed and has noticed a tenderness at the ball of her foot. I asked if the pt would like to come in the office next week for eval and she states that she would like to give it a little more time and will call if she is not making progress.

## 2017-11-15 NOTE — Telephone Encounter (Signed)
Pt was in the office 11/07/17. Dictation is not available to review. Called and lm on vm to advise to call the office back to discuss what exercises she is doing and concerns that she has.

## 2017-11-26 ENCOUNTER — Encounter (INDEPENDENT_AMBULATORY_CARE_PROVIDER_SITE_OTHER): Payer: Self-pay | Admitting: Orthopedic Surgery

## 2017-11-26 NOTE — Progress Notes (Addendum)
Office Visit Note   Patient: Anne [name]           Date of Birth: [birth year]           MRN: [id number] Visit Date: 11/07/2017              Requested by: Merlene Laughter, MD [address] [place], [place] [address] PCP: Merlene Laughter, MD  Chief Complaint  Patient presents with  . Left Foot - Pain    Claw toe      HPI: Patient is an 84 year old woman who was seen for initial evaluation for left forefoot pain.  Patient states she has had an MRI scan of her foot as well as nerve conduction studies which patient reports as normal.  Patient complains of painful clawing of the toes pain beneath the metatarsal heads.  Complains of a burning pain in the forefoot after ambulation.  Assessment & Plan: Visit Diagnoses:  1. Achilles tendon contracture, left   2. Claw toe, acquired, left     Plan: Patient was given instructions for Achilles stretching this was demonstrated also recommend a Trail running sneaker and orthotics.  Feel that with proper offloading of the forefoot her burning symptoms should resolve.  If conservative measures do not resolve her problems discussed that we could consider a gastrocnemius recession as well as a Weil osteotomy for the second and third metatarsals and PIP resection for the second toe.  Follow-Up Instructions: Return if symptoms worsen or fail to improve.   Ortho Exam  Patient is alert, oriented, no adenopathy, well-dressed, normal affect, normal respiratory effort. Examination patient has a good dorsalis pedis and posterior tibial pulse she does have heel cord contracture with dorsiflexion only to neutral with her knee extended.  She has prominent metatarsal heads primarily beneath the second and third metatarsal she has fixed clawing of the second toe with callus dorsally.  Review of the radiographs shows a long second and third metatarsal.  Imaging: No results found. No images are attached to the encounter.  Labs: Lab Results    Component Value Date   ESRSEDRATE 29 10/07/2012   REPTSTATUS 09/06/2009 FINAL 07/27/2009   REPTSTATUS 09/05/2009 FINAL 07/27/2009   REPTSTATUS 07/29/2009 FINAL 07/27/2009   GRAMSTAIN  07/27/2009    RARE WBC PRESENT, PREDOMINANTLY PMN RARE SQUAMOUS EPITHELIAL CELLS PRESENT NO ORGANISMS SEEN   CULT NO ACID FAST BACILLI ISOLATED IN 6 WEEKS 07/27/2009   CULT MYCELIS STERILIA 07/27/2009   CULT Non-Pathogenic Oropharyngeal-type Flora Isolated. 07/27/2009   LABORGA NO GROWTH 07/25/2011     Lab Results  Component Value Date   ALBUMIN 3.3 (L) 05/31/2012   ALBUMIN 3.7 07/25/2009   ALBUMIN 3.5 11/28/2006    Body mass index is 26.16 kg/m.  Orders:  No orders of the defined types were placed in this encounter.  No orders of the defined types were placed in this encounter.    Procedures: No procedures performed  Clinical Data: No additional findings.  ROS:  All other systems negative, except as noted in the HPI. Review of Systems  Objective: Vital Signs: Ht 5\' 7"  (1.702 m)   Wt 167 lb (75.8 kg)   BMI 26.16 kg/m   Specialty Comments:  No specialty comments available.  PMFS History: Patient Active Problem List   Diagnosis Date Noted  . Essential hypertension 12/19/2015  . Diastolic dysfunction 10/26/2015  . Dysphagia 01/04/2015  . Chest tightness 11/16/2014  . Dyspnea 11/15/2014  . COPD GOLD I 11/02/2014  .  GERD (gastroesophageal reflux disease) 11/02/2014  . Pain in limb 08/18/2012  . Routine gynecological examination 07/24/2011  . Environmental allergies 07/24/2011  . Osteoporosis 07/24/2011  . Nocturia 07/24/2011  . Asthma 07/04/2007  . PULMONARY COLLAPSE 07/04/2007  . PULMONARY NODULE 07/04/2007   Past Medical History:  Diagnosis Date  . Allergic rhinitis   . Arthritis   . Asthma   . Atypical chest pain   . Collapse of right lung   . Dyslipidemia   . Gastroesophageal reflux disease   . Glaucoma   . Hematuria   . Hyperlipidemia   . Lung nodules      Family History  Problem Relation Age of Onset  . Cancer Mother   . Heart disease Father   . Breast cancer Cousin   . Heart attack Neg Hx     Past Surgical History:  Procedure Laterality Date  . ANAL FISSURE REPAIR    . BREAST BIOPSY Right   . CATARACT EXTRACTION Bilateral   . MASS EXCISION  04/11/2012   Procedure: EXCISION MASS;  Surgeon: Nicki Reaper, MD;  Location: [facility];  Service: Orthopedics;  Laterality: Right;  Excision of mass right upper arm  . MEDIASTINOSCOPY  4/11  . NASAL SINUS SURGERY    . TOTAL ABDOMINAL HYSTERECTOMY W/ BILATERAL SALPINGOOPHORECTOMY     Social History   Occupational History  . Occupation: retired  Tobacco Use  . Smoking status: Never Smoker  . Smokeless tobacco: Never Used  Substance and Sexual Activity  . Alcohol use: No    Alcohol/week: 0.0 standard drinks  . Drug use: No  . Sexual activity: Never    Birth control/protection: Surgical

## 2017-12-12 ENCOUNTER — Encounter

## 2017-12-12 ENCOUNTER — Encounter: Payer: Self-pay | Admitting: Neurology

## 2017-12-12 ENCOUNTER — Ambulatory Visit: Payer: Medicare Other | Admitting: Neurology

## 2017-12-12 ENCOUNTER — Other Ambulatory Visit: Payer: Medicare Other

## 2017-12-12 VITALS — BP 120/64 | HR 77 | Ht 67.0 in | Wt 167.2 lb

## 2017-12-12 DIAGNOSIS — G8929 Other chronic pain: Secondary | ICD-10-CM | POA: Diagnosis not present

## 2017-12-12 DIAGNOSIS — R200 Anesthesia of skin: Secondary | ICD-10-CM

## 2017-12-12 DIAGNOSIS — M79675 Pain in left toe(s): Secondary | ICD-10-CM | POA: Diagnosis not present

## 2017-12-12 MED ORDER — GABAPENTIN 100 MG PO CAPS: ORAL_CAPSULE | ORAL | 5 refills | Status: DC

## 2017-12-12 NOTE — Patient Instructions (Addendum)
Week 1:  Take gabapentin 300mg + 100mg  tablets Week 2:  Increase to gabapentin 300mg  + 200mg    If it makes you too sleepy or groggy, stop the medication and call my office to start another medication  Call with an update in 1 months  Return to clinic in 3-4 months

## 2017-12-12 NOTE — Progress Notes (Signed)
Follow-up Visit   Date: 12-20-17    Anne [name] MRN: [id number] DOB: [birth year]   Interim History: Anne [name] is a 84 y.o. right-handed African American female with asthma, GERD, hyperlipidemia returning the clinic for follow-up of bilateral feet pain.  The patient was accompanied to the clinic by self.  History of present illness: Since early February 27, 2017, she has intermittent stabbing pain over the tip of her left second toe, which occurs sporadically especially at night. Pain is worse with standing or wearing constrictive shoes.  She also complains of dull pain over the medial lower leg and ankle.  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  Unfortunately, she did not have any relief of her foot pain.  She has tried ibuprofen, Aleve, and gabapentin without any relief. MRI left foot which shows nonspecific soft tissue in the second toe with possible postsurgical changes in the flexor digitorum longus tendon. NCS/EMG was normal and did not show evidence of neuropathy or lumbosacral radiculopathy.   She does not have history of diabetes.  She denies any weakness or imbalance and continues to walk unassisted.  She lives alone in a one-level home; her husband passed away in 02/27/17.   UPDATE Dec 20, 2017:  She is here for follow-up visit in your complaints of numbness of the toes.  Starting in August 2019, she began having tingling and burning pain involving left 2nd toe, but she has also noticed numbness involving all the toes.  Pain is worse in the evening and prevents her from sleeping.  She has taken gabapentin 300mg  at bedtime but make feel groggy and has not provided any benefit. She has found a CBD derivative ointment which she applies 2-3 times a day, which seems to provide some relief.She has no weakness or imbalance. She will be celebrating 87th birthday this weekend.  Medications:  Current  Outpatient Medications on File Prior to Visit  Medication Sig Dispense Refill  . albuterol (PROAIR HFA) 108 (90 Base) MCG/ACT inhaler Inhale 1-2 puffs into the lungs every 6 (six) hours as needed for wheezing or shortness of breath. 1 Inhaler 3  . albuterol (PROVENTIL) (2.5 MG/3ML) 0.083% nebulizer solution Take 3 mLs (2.5 mg total) by nebulization every 6 (six) hours as needed for wheezing or shortness of breath. 75 mL 12  . aspirin EC 81 MG tablet Take 1 tablet (81 mg total) by mouth daily. 90 tablet 3  . BREO ELLIPTA 100-25 MCG/INH AEPB TAKE 1 PUFF BY MOUTH EVERY DAY 60 each 3  . Calcium Carb-Cholecalciferol (CALCIUM 600 + D) 600-200 MG-UNIT TABS Take 2 tablets by mouth daily.     . cetirizine (ZYRTEC) 10 MG tablet Take 10 mg by mouth every morning.    . fluticasone (FLONASE) 50 MCG/ACT nasal spray Place 1 spray into both nostrils 2 (two) times daily as needed for allergies.     . fluticasone furoate-vilanterol (BREO ELLIPTA) 100-25 MCG/INH AEPB Inhale 1 puff into the lungs daily. 1 each 0  . gabapentin (NEURONTIN) 300 MG capsule Take 1 capsule (300 mg total) by mouth at bedtime. 90 capsule 3  . latanoprost (XALATAN) 0.005 % ophthalmic solution Place 1 drop into both eyes at bedtime.    . Multiple Vitamin (MULTIVITAMIN) tablet Take 1 tablet by mouth every morning.     . TURMERIC PO Take 1 capsule by mouth daily.    . vitamin B-12 (CYANOCOBALAMIN) 1000 MCG  tablet Take by mouth.     Current Facility-Administered Medications on File Prior to Visit  Medication Dose Route Frequency Provider Last Rate Last Dose  . regadenoson (LEXISCAN) injection SOLN 0.4 mg  0.4 mg Intravenous Once Quintella Reichert, MD        Allergies:  Allergies  Allergen Reactions  . Other Shortness Of Breath    Pt states she has asthma and seafood causes breathing problems.   . Shellfish Allergy Shortness Of Breath  . Iodine Other (See Comments)    REACTION: " UNSURE TOLD TO LIST AS ALLERGEN BY MD" REACTION: " UNSURE  TOLD TO LIST AS ALLERGEN BY MD"  . Latex Itching  . Adhesive [Tape] Rash    Review of Systems:  CONSTITUTIONAL: No fevers, chills, night sweats, or weight loss.  EYES: No visual changes or eye pain ENT: No hearing changes.  No history of nose bleeds.   RESPIRATORY: No cough, wheezing and shortness of breath.   CARDIOVASCULAR: Negative for chest pain, and palpitations.   GI: Negative for abdominal discomfort, blood in stools or black stools.  No recent change in bowel habits.   GU:  No history of incontinence.   MUSCLOSKELETAL: +history of joint pain or swelling.  No myalgias.   SKIN: Negative for lesions, rash, and itching.   ENDOCRINE: Negative for cold or heat intolerance, polydipsia or goiter.   PSYCH:  No depression or anxiety symptoms.   NEURO: As Above.   Vital Signs:  BP 120/64   Pulse 77   Ht 5\' 7"  (1.702 m)   Wt 167 lb 4 oz (75.9 kg)   SpO2 98%   BMI 26.20 kg/m    General Medical Exam:   General:  Well appearing, much younger than stated age, comfortable  Eyes/ENT: see cranial nerve examination.   Neck:   No carotid bruits. Respiratory:  Clear to auscultation, good air entry bilaterally.   Cardiac:  Regular rate and rhythm, no murmur.   Ext:  No edema  Neurological Exam: MENTAL STATUS including orientation to time, place, person, recent and remote memory, attention span and concentration, language, and fund of knowledge is normal.  Speech is not dysarthric.  CRANIAL NERVES: Face is symmetric.   MOTOR:  Motor strength is 5/5 in all extremities, including distally.  No atrophy, fasciculations or abnormal movements.  No pronator drift.  Tone is normal.    MSRs:  Reflexes are 2+/4 throughout, except absent bilateral Achilles.  SENSORY:  Vibration is absent at the great toe bilaterally, and intact above the ankles. Temperature and pinprick is also slightly dulled at the toes.Marland Kitchen  COORDINATION/GAIT:  Gait narrow based and stable.   Data: NCS/EMG of the left lower  extremity 04/11/2017:  This is a normal study of the left lower extremity. In particular, there is no evidence of a sensorimotor polyneuropathy or lumbosacral radiculopathy.  MRI left foot 02/19/2017: 1. Mild nonspecific soft tissue edema in the second toe with possible postsurgical changes in the flexor digitorum longus tendon. 2. No significant arthropathic changes or acute osseous findings in the second toe. There are mild hammertoe deformities of the digits.  3. Moderate degenerative changes at the first metatarsal phalangeal joint.   IMPRESSION/PLAN: 1.  Bilateral feet numbness, possible early signs of peripheral neuropathy. Her previous nerve conduction study performed in January 2019 was normal. I think it is too early to repeat this but will consider doing so if her symptoms continue to get worse.In the meantime, labs including vitamin B-12, folate,  and carpal will be checked.  2.  Left second toe pain,this most likely musculoskeletal versus RSD.  We discussed management options including slowly titrating gabapentin or switching to an alternative medication, such as nortriptyline. Side effects of Prozac were discussed, and it was opted to slowly titrate gabapentin.  She was instructed to increase to 400 mg at bedtime for one week and if tolerating further to 500 mg at bedtime. If she develops cognitive side effects, I will switch her to nortriptyline.  Return to clinic in 4 months   Thank you for allowing me to participate in patient's care.  If I can answer any additional questions, I would be pleased to do so.    Sincerely,    Cosmo Tetreault K. Allena Katz, DO

## 2017-12-13 ENCOUNTER — Other Ambulatory Visit (INDEPENDENT_AMBULATORY_CARE_PROVIDER_SITE_OTHER): Payer: Medicare Other

## 2017-12-13 DIAGNOSIS — M79675 Pain in left toe(s): Secondary | ICD-10-CM

## 2017-12-13 DIAGNOSIS — G8929 Other chronic pain: Secondary | ICD-10-CM

## 2017-12-13 DIAGNOSIS — R200 Anesthesia of skin: Secondary | ICD-10-CM | POA: Diagnosis not present

## 2017-12-13 LAB — VITAMIN B12: VITAMIN B 12: 1489 pg/mL — AB (ref 211–911)

## 2017-12-13 LAB — FOLATE: Folate: >24.1 ng/mL (ref >5.9)

## 2017-12-15 LAB — COPPER, SERUM: Copper: 131 ug/dL (ref 70–175)

## 2017-12-17 ENCOUNTER — Telehealth: Payer: Self-pay | Admitting: *Deleted

## 2017-12-17 ENCOUNTER — Ambulatory Visit: Payer: Medicare Other | Admitting: Internal Medicine

## 2017-12-17 ENCOUNTER — Encounter: Payer: Self-pay | Admitting: Internal Medicine

## 2017-12-17 VITALS — BP 130/68 | HR 71 | Ht 67.0 in | Wt 167.4 lb

## 2017-12-17 DIAGNOSIS — J4530 Mild persistent asthma, uncomplicated: Secondary | ICD-10-CM

## 2017-12-17 DIAGNOSIS — Z23 Encounter for immunization: Secondary | ICD-10-CM | POA: Diagnosis not present

## 2017-12-17 NOTE — Telephone Encounter (Signed)
Left message notifying patient.

## 2017-12-17 NOTE — Patient Instructions (Signed)
ICD-10-CM   1. Mild persistent asthma without complication R41.63     Doing well Asthma well controlled Looks like breo is better for you   Plan Continue breo daily Alb as needed High dose flu shot 12/17/2017  Followup 6 months or sooner if needed

## 2017-12-17 NOTE — Progress Notes (Signed)
HPI Chief Complaint  Patient presents with  . Acute Visit    cough     Referring provider: Merlene Laughter, MD  HPI: 84 year old female never smoker followed for mild persistent asthma History of diastolic dysfunction Known chronic right middle lobe collapse/ lymphadenopathy -s/p 07/2007 FOB +psuedomonas  Test Exhaled nitric oxide test May 2018: 17 ppb High resolution CT chest August 2016 showed no evidence of interstitial lung disease, chronic collapse of right middle lobe MEdiastinaoscopy 07/27/2009 - Benign node of 4R and 4L. ANthracotoic pigment +. No granuoma. Non malignant PFT 10/28/2014 FEV1 94, ratio 54, FVC 135, no significant bronchodilator response, mid flows reversibility, DLCO 65%   04/25/2017 Acute OV : Asthma  Patient presents for an acute office visit.  She complains of 3 weeks of cough ,congestion sinus congestion, drainage , wheezing . Seen PCP dx w/ bronchitis , rx  , Doxycycline and Prednisone . Feels some better but still has chest congestion and tightness .  She remains on Breo daily.    Appetite is okay w/ no nv/d.  No chest pain, orthopnea or ede   OV 06/05/2017  Chief Complaint  Patient presents with  . Follow-up    Pt states she has been doing good since last visit. Pt states she does become SOB with exertion. Denies any cough or CP.    Fu Mild persiostent asthma with associated diastolic dysfunction  Contines on broe. Overall stable. But last  4 month has has had 2 surgeries for hammer toe on left food. So less active. Correlating with this more dyspnea on exertion than usual. Also, found to have mold in the bathroom and professionally removed . Has not affected her but she is asking for feno and walk test both of which aare normal. She wants sample breo       OV 12/17/2017  Subjective:  Patient ID: Anne [name], female , DOB: 04/29/1930 , age 84 y.o. , MRN: [id number] , ADDRESS: [address] [place] [address]   12/17/2017 -    Chief Complaint  Patient presents with  . Follow-up    Pt states she has been doing well since last visit and denies any complaints.   Fu Mild persiostent asthma with associated diastolic dysfunction  HPI Anne [name] 84 y.o. -  Mild persistent asthma follow-up. Recently she was switched to Brio on Qvar. This is helped his symptoms. In addition she is in the mold in the house.She feels great. She is somewhat she has done well. No interim asthma exacerbations. She will have a high dose flu shot today. There are really no other issues other than some mild recurrence of costochondral chest pain in the last few days is a recurrent problem for her.     Results for Anne, [name] (MRN [id number]) as of 06/05/2017 14:14  Ref. Range 08/07/2016 12:01 02/12/2017 11:30 06/05/2017 13:04  Nitric Oxide Unknown 17 18 20     ROS - per HPI     has a past medical history of Allergic rhinitis, Arthritis, Asthma, Atypical chest pain, Collapse of right lung, Dyslipidemia, Gastroesophageal reflux disease, Glaucoma, Hematuria, Hyperlipidemia, and Lung nodules.   reports that she has never smoked. She has never used smokeless tobacco.  Past Surgical History:  Procedure Laterality Date  . ANAL FISSURE REPAIR    . BREAST BIOPSY Right   . CATARACT EXTRACTION Bilateral   . MASS EXCISION  04/11/2012   Procedure: EXCISION MASS;  Surgeon: Nicki Reaper, MD;  Location: [facility];  Service: Orthopedics;  Laterality: Right;  Excision of mass right upper arm  . MEDIASTINOSCOPY  4/11  . NASAL SINUS SURGERY    . TOTAL ABDOMINAL HYSTERECTOMY W/ BILATERAL SALPINGOOPHORECTOMY      Allergies  Allergen Reactions  . Other Shortness Of Breath    Pt states she has asthma and seafood causes breathing problems.   . Shellfish Allergy Shortness Of Breath  . Iodine Other (See Comments)    REACTION: " UNSURE TOLD TO LIST AS ALLERGEN BY MD" REACTION: " UNSURE TOLD TO LIST AS ALLERGEN BY MD"  . Latex Itching  .  Adhesive [Tape] Rash    Immunization History  Administered Date(s) Administered  . Influenza Whole 04/04/2009  . Influenza, High Dose Seasonal PF 12/31/2016  . Influenza,inj,Quad PF,6+ Mos 01/03/2015, 12/02/2015  . Influenza-Unspecified 02/14/2014  . Pneumococcal Polysaccharide-23 06/01/2003  . Pneumococcal-Unspecified 01/03/2015    Family History  Problem Relation Age of Onset  . Cancer Mother   . Heart disease Father   . Breast cancer Cousin   . Heart attack Neg Hx      Current Outpatient Medications:  .  albuterol (PROAIR HFA) 108 (90 Base) MCG/ACT inhaler, Inhale 1-2 puffs into the lungs every 6 (six) hours as needed for wheezing or shortness of breath., Disp: 1 Inhaler, Rfl: 3 .  albuterol (PROVENTIL) (2.5 MG/3ML) 0.083% nebulizer solution, Take 3 mLs (2.5 mg total) by nebulization every 6 (six) hours as needed for wheezing or shortness of breath., Disp: 75 mL, Rfl: 12 .  BREO ELLIPTA 100-25 MCG/INH AEPB, TAKE 1 PUFF BY MOUTH EVERY DAY, Disp: 60 each, Rfl: 3 .  Calcium Carb-Cholecalciferol (CALCIUM 600 + D) 600-200 MG-UNIT TABS, Take 2 tablets by mouth daily. , Disp: , Rfl:  .  cetirizine (ZYRTEC) 10 MG tablet, Take 10 mg by mouth every morning., Disp: , Rfl:  .  fluticasone (FLONASE) 50 MCG/ACT nasal spray, Place 1 spray into both nostrils 2 (two) times daily as needed for allergies. , Disp: , Rfl:  .  gabapentin (NEURONTIN) 100 MG capsule, Take 1-2 tablets at bedtime with your 300mg  tablets., Disp: 45 capsule, Rfl: 5 .  gabapentin (NEURONTIN) 300 MG capsule, Take 1 capsule (300 mg total) by mouth at bedtime., Disp: 90 capsule, Rfl: 3 .  latanoprost (XALATAN) 0.005 % ophthalmic solution, Place 1 drop into both eyes at bedtime., Disp: , Rfl:  .  Multiple Vitamin (MULTIVITAMIN) tablet, Take 1 tablet by mouth every morning. , Disp: , Rfl:  .  TURMERIC PO, Take 1 capsule by mouth daily., Disp: , Rfl:  .  vitamin B-12 (CYANOCOBALAMIN) 1000 MCG tablet, Take by mouth., Disp: , Rfl:    No current facility-administered medications for this visit.   Facility-Administered Medications Ordered in Other Visits:  .  regadenoson (LEXISCAN) injection SOLN 0.4 mg, 0.4 mg, Intravenous, Once, Turner, Traci R, MD      Objective:   Vitals:   12/17/17 1056  BP: 130/68  Pulse: 71  SpO2: 98%  Weight: 167 lb 6.4 oz (75.9 kg)  Height: 5\' 7"  (1.702 m)    Estimated body mass index is 26.22 kg/m as calculated from the following:   Height as of this encounter: 5\' 7"  (1.702 m).   Weight as of this encounter: 167 lb 6.4 oz (75.9 kg).  @WEIGHTCHANGE @  [employer]   12/17/17 1056  Weight: 167 lb 6.4 oz (75.9 kg)     Physical Exam  General Appearance:    Alert,  cooperative, no distress, appears stated age - younger than stated age + , sitting on - chair  Head:    Normocephalic, without obvious abnormality, atraumatic  Eyes:    PERRL, conjunctiva/corneas clear,  Ears:    Normal TM's and external ear canals, both ears  Nose:   Nares normal, septum midline, mucosa normal, no drainage    or sinus tenderness. OXYGEN ON  - no . Patient is @ room air   Throat:   Lips, mucosa, and tongue normal; teeth and gums normal. Cyanosis on lips - no  Neck:   Supple, symmetrical, trachea midline, no adenopathy;    thyroid:  no enlargement/tenderness/nodules; no carotid   bruit or JVD  Back:     Symmetric, no curvature, ROM normal, no CVA tenderness  Lungs:     Distress - no , Wheeze no, Barrell Chest - no, Purse lip breathing - no, Crackles - no   Chest Wall:    No tenderness or deformity. Scars in chest no   Heart:    Regular rate and rhythm, S1 and S2 normal, no rub   or gallop, Murmur - no  Breast Exam:    NOT DONE  Abdomen:     Soft, non-tender, bowel sounds active all four quadrants,    no masses, no organomegaly  Genitalia:   NOT DONE  Rectal:   NOT DONE  Extremities:   Extremities normal, atraumatic, Clubbing - no, Edema - no  Pulses:   2+ and symmetric all extremities  Skin:    Stigmata of Connective Tissue Disease - no  Lymph nodes:   Cervical, supraclavicular, and axillary nodes normal  Psychiatric:  Neurologic:   pleasant CNII-XII intact, normal strength, sensation  throughout           Assessment:       ICD-10-CM   1. Mild persistent asthma without complication J45.30        Plan:      Doing well Asthma well controlled Looks like breo is better for you   Plan Continue breo daily Alb as needed High dose flu shot 12/17/2017  Followup 6 months or sooner if needed           SIGNATURE    Dr. Kalman Shan, M.D., F.C.C.P,  Pulmonary and Critical Care Medicine Staff Physician, [facility] Director - Interstitial Lung Disease  Program  Pulmonary Fibrosis [facility] [place], [place], [address]  Pager: [phone], If no answer or between  15:00h - 7:00h: call [phone] Telephone: [phone]  11:05 AM 12/17/2017

## 2017-12-17 NOTE — Telephone Encounter (Signed)
-----   Message from [name], DO sent at 12/16/2017  8:39 AM EDT ----- Please notify patient lab are within normal limits.  Thank you.

## 2017-12-25 ENCOUNTER — Telehealth: Payer: Self-pay | Admitting: Neurology

## 2017-12-25 NOTE — Telephone Encounter (Signed)
Left message for patient to call me back. 

## 2017-12-25 NOTE — Telephone Encounter (Signed)
Patient is calling in stating that the gabapentin is causing her to have blurry vision, tired and coordination is not good. Please call her back at [phone]. Thanks!

## 2017-12-26 NOTE — Telephone Encounter (Signed)
Left message for patient to call me back for instructions.

## 2017-12-26 NOTE — Telephone Encounter (Signed)
Yes, please have her slowly taper gabapentin down to 300mg  at bedtime for one week, then stop.   Start nortriptyline 10mg  at bedtime x 2 weeks then increase to 20mg  at bedtime.  Side effects including sleepiness, dizziness, dry eyes/dry mouth, usually occur at higher dosage.  Donika K. Posey Pronto, DO

## 2017-12-26 NOTE — Telephone Encounter (Signed)
I spoke with patient and she is having some blurred vision with the gabapentin.  Can we switch to something else?

## 2017-12-26 NOTE — Telephone Encounter (Signed)
Patient Called and left a voicemail that she is calling back for [place]. Thanks!

## 2017-12-27 ENCOUNTER — Ambulatory Visit: Payer: Medicare Other | Admitting: Neurology

## 2017-12-27 ENCOUNTER — Other Ambulatory Visit: Payer: Self-pay | Admitting: *Deleted

## 2017-12-27 MED ORDER — NORTRIPTYLINE HCL 10 MG PO CAPS: ORAL_CAPSULE | ORAL | 3 refills | Status: DC

## 2017-12-27 NOTE — Telephone Encounter (Signed)
Patient called back and was given instructions.  Rx sent in to pharmacy.

## 2018-01-02 ENCOUNTER — Telehealth: Payer: Self-pay | Admitting: Neurology

## 2018-01-02 NOTE — Telephone Encounter (Signed)
Received prior authorization for Nortriptyline 10 mg valid through 04/02/2019.

## 2018-01-10 DIAGNOSIS — H66012 Acute suppurative otitis media with spontaneous rupture of ear drum, left ear: Secondary | ICD-10-CM | POA: Insufficient documentation

## 2018-01-16 ENCOUNTER — Telehealth: Payer: Self-pay | Admitting: *Deleted

## 2018-01-16 NOTE — Telephone Encounter (Signed)
Opened in error

## 2018-01-22 ENCOUNTER — Other Ambulatory Visit: Payer: Self-pay | Admitting: Neurology

## 2018-02-05 ENCOUNTER — Other Ambulatory Visit: Payer: Self-pay | Admitting: Internal Medicine

## 2018-03-15 ENCOUNTER — Other Ambulatory Visit: Payer: Self-pay | Admitting: Pulmonary Disease

## 2018-04-08 ENCOUNTER — Ambulatory Visit: Payer: Medicare Other | Admitting: Podiatry

## 2018-04-08 ENCOUNTER — Ambulatory Visit: Payer: Medicare Other | Admitting: Sports Medicine

## 2018-04-08 ENCOUNTER — Encounter: Payer: Self-pay | Admitting: Sports Medicine

## 2018-04-08 ENCOUNTER — Ambulatory Visit (INDEPENDENT_AMBULATORY_CARE_PROVIDER_SITE_OTHER): Payer: Medicare Other

## 2018-04-08 DIAGNOSIS — M1990 Unspecified osteoarthritis, unspecified site: Secondary | ICD-10-CM | POA: Insufficient documentation

## 2018-04-08 DIAGNOSIS — M21619 Bunion of unspecified foot: Secondary | ICD-10-CM | POA: Diagnosis not present

## 2018-04-08 DIAGNOSIS — M792 Neuralgia and neuritis, unspecified: Secondary | ICD-10-CM

## 2018-04-08 DIAGNOSIS — M2042 Other hammer toe(s) (acquired), left foot: Secondary | ICD-10-CM | POA: Diagnosis not present

## 2018-04-08 DIAGNOSIS — D259 Leiomyoma of uterus, unspecified: Secondary | ICD-10-CM | POA: Insufficient documentation

## 2018-04-08 DIAGNOSIS — B009 Herpesviral infection, unspecified: Secondary | ICD-10-CM | POA: Insufficient documentation

## 2018-04-08 DIAGNOSIS — M79672 Pain in left foot: Secondary | ICD-10-CM | POA: Diagnosis not present

## 2018-04-08 DIAGNOSIS — H409 Unspecified glaucoma: Secondary | ICD-10-CM | POA: Insufficient documentation

## 2018-04-08 NOTE — Progress Notes (Signed)
Subjective: Anne [name] is a 85 y.o. female patient who presents to office for evaluation of bilateral numbness and left foot pain especially at bedtime sometimes at the second toe. Patient complains of progressive pain especially over the last several years.  Reports that she has tried multiple treatments for the numbness and is even seen a neurologist with recommendation to take gabapentin but reports that she had some side effects and does not like how it makes her feel so was not able to tolerate this medication.  Patient states that she is going to a laser specialist was also doing treatments for her numbness but states that these treatments have not been successful and she has been doing this for about a month.  Patient admits to a previous history of surgery at her left second toe with a release the tendon to try to get the toe to sit in a better position but the toe has drifted back and sometimes it is painful.  Patient denies any other pedal complaints. Denies injury/trip/fall/sprain/any causative factors.   Review of Systems  Musculoskeletal: Positive for joint pain.  Neurological: Positive for sensory change.  All other systems reviewed and are negative.  Admits to a history of low back issues.  Patient Active Problem List   Diagnosis Date Noted  . Arthritis 04/08/2018  . Glaucoma 04/08/2018  . Herpes simplex type 2 infection 04/08/2018  . Uterine leiomyoma 04/08/2018  . Non-recurrent acute suppurative otitis media of left ear with spontaneous rupture of tympanic membrane 01/10/2018  . Pain 05/29/2017  . Throat pain in adult 05/28/2017  . Thrush of mouth and esophagus ([place]) 05/28/2017  . Acute bacterial sinusitis 10/10/2016  . De Quervain's syndrome (tenosynovitis) 01/25/2016  . Primary osteoarthritis of first carpometacarpal joint of right hand 01/25/2016  . Trigger finger of right thumb 01/25/2016  . Essential hypertension 12/19/2015  . Diastolic dysfunction 19/37/9024  .  Dysphagia 01/04/2015  . Chest tightness 11/16/2014  . Dyspnea 11/15/2014  . COPD GOLD I 11/02/2014  . GERD (gastroesophageal reflux disease) 11/02/2014  . Proptosis 05/31/2014  . Glaucoma, bilateral 05/31/2014  . Ptosis of both eyelids 05/31/2014  . Xanthelasma of left upper eyelid 05/31/2014  . Pain in limb 08/18/2012  . Routine gynecological examination 07/24/2011  . Environmental allergies 07/24/2011  . Osteoporosis 07/24/2011  . Nocturia 07/24/2011  . Asthma 07/04/2007  . PULMONARY COLLAPSE 07/04/2007  . PULMONARY NODULE 07/04/2007    Current Outpatient Medications on File Prior to Visit  Medication Sig Dispense Refill  . albuterol (PROVENTIL) (2.5 MG/3ML) 0.083% nebulizer solution Take 3 mLs (2.5 mg total) by nebulization every 6 (six) hours as needed for wheezing or shortness of breath. 75 mL 12  . BREO ELLIPTA 100-25 MCG/INH AEPB TAKE 1 PUFF BY MOUTH EVERY DAY 60 each 3  . Calcium Carb-Cholecalciferol (CALCIUM 600 + D) 600-200 MG-UNIT TABS Take 2 tablets by mouth daily.     . Calcium Carbonate-Vitamin D (CALCIUM HIGH POTENCY/VITAMIN D) 600-200 MG-UNIT TABS Take by mouth.    . cetirizine (ZYRTEC) 10 MG tablet Take 10 mg by mouth every morning.    . fluticasone (FLONASE) 50 MCG/ACT nasal spray Place 1 spray into both nostrils 2 (two) times daily as needed for allergies.     Marland Kitchen gabapentin (NEURONTIN) 100 MG capsule Take 1-2 tablets at bedtime with your 300mg  tablets. 45 capsule 5  . gabapentin (NEURONTIN) 300 MG capsule Take 1 capsule (300 mg total) by mouth at bedtime. 90 capsule 3  . latanoprost (XALATAN)  0.005 % ophthalmic solution Place 1 drop into both eyes at bedtime.    . Multiple Vitamin (MULTIVITAMIN) tablet Take 1 tablet by mouth every morning.     . nortriptyline (PAMELOR) 10 MG capsule TAKE 10 MG AT BEDTIME X 2 WEEKS THEN INCREASE TO 20 MG AT BEDTIME. 180 capsule 2  . PROAIR HFA 108 (90 Base) MCG/ACT inhaler INHALE 1-2 PUFFS INTO THE LUNGS EVERY 6 (SIX) HOURS AS NEEDED  FOR WHEEZING OR SHORTNESS OF BREATH. 8.5 Inhaler 2  . TURMERIC PO Take 1 capsule by mouth daily.    . vitamin B-12 (CYANOCOBALAMIN) 1000 MCG tablet Take by mouth.     Current Facility-Administered Medications on File Prior to Visit  Medication Dose Route Frequency Provider Last Rate Last Dose  . regadenoson (LEXISCAN) injection SOLN 0.4 mg  0.4 mg Intravenous Once Sueanne Margarita, MD        Allergies  Allergen Reactions  . Other Shortness Of Breath    Pt states she has asthma and seafood causes breathing problems.   . Shellfish Allergy Shortness Of Breath  . Iodine Other (See Comments)    REACTION: " UNSURE TOLD TO LIST AS ALLERGEN BY MD" REACTION: " UNSURE TOLD TO LIST AS ALLERGEN BY MD"  . Latex Itching  . Adhesive [Tape] Rash    Objective:  General: Alert and oriented x3 in no acute distress  Dermatology: No open lesions bilateral lower extremities, no webspace macerations, no ecchymosis bilateral, all nails x 10 are well manicured.  Mild corn overlying the dorsal aspect of the second toe at the interphalangeal joint on the left.  Vascular: Dorsalis Pedis and Posterior Tibial pedal pulses palpable, Capillary Fill Time 5 seconds,(+) state pedal hair growth bilateral, no edema bilateral lower extremities, Temperature gradient within normal limits.  Neurology: Johney Maine sensation intact via light touch bilateral, however protective sensation and vibratory sensation mildly diminished with subjective electrical shooting pain from the feet to the legs bilateral.  Musculoskeletal: Mild tenderness with palpation at left second metatarsophalangeal joint with structural hammertoe and bunion deformity with fat pad atrophy.  Pes planus foot type   Xrays  Left foot   Impression: Osseous mineralization within normal limits for patient status, there is increased intermetatarsal angle supportive of bunion deformity, there is increased digital contracture especially at the left second toe consistent  with hammertoe deformity, there is midtarsal breech supportive of pes planus and mild areas of arthritis.  No fracture.  Soft tissues within normal limits for patient status.  Assessment and Plan: Problem List Items Addressed This Visit      Other   Pain in limb (Chronic)    Other Visit Diagnoses    Hammer toe of left foot    -  Primary   Relevant Orders   DG Foot Complete Left (Completed)   Neuritis       Bunion           -Complete examination performed -Xrays reviewed -Discussed treatement options for neuropathy/neuritis and structural hammertoe with likely recurrent capsulitis secondary to overall foot structure -Advised patient that there are not many great treatment options for neuropathy and from her chart review and her history she has undergone extensive work-up and treatment and I advised patient that there is no guarantee that any of these treatments would ever proved to be successful.  Recommend to refer to neurology recommendations -For comfort measures may try topical pain cream or rub -Advised patient if pain at left second toe joint continues to be  a problem to return to office for steroid injections -Encourage good supportive shoes cushions pads and offloading to structurally decrease any excessive use or transfer low to the second metatarsophalangeal joint on the left -Patient to return to office as needed or sooner if condition worsens.  Landis Martins, DPM

## 2018-04-10 NOTE — Progress Notes (Signed)
Follow-up Visit   Date: 04/11/18    Anne [name] MRN: [id number] DOB: 01-06-1931   Interim History: Anne [name] is a 85 y.o. right-handed African American female with asthma, GERD, hyperlipidemia returning the clinic for follow-up of bilateral feet pain.  The patient was accompanied to the clinic by self.  History of present illness: Since early 03-15-2017, she has intermittent stabbing pain over the tip of her left second toe, which occurs sporadically especially at night. Pain is worse with standing or wearing constrictive shoes.  She also complains of dull pain over the medial lower leg and ankle.  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  Unfortunately, she did not have any relief of her foot pain.  She has tried ibuprofen, Aleve, and gabapentin without any relief. MRI left foot which shows nonspecific soft tissue in the second toe with possible postsurgical changes in the flexor digitorum longus tendon. NCS/EMG was normal and did not show evidence of neuropathy or lumbosacral radiculopathy.   She does not have history of diabetes.  She denies any weakness or imbalance and continues to walk unassisted.  She lives alone in a one-level home; her husband passed away in March 15, 2017.   UPDATE 2018-01-05:   Starting in August 2019, she began having tingling and burning pain involving left 2nd toe, but she has also noticed numbness involving all the toes.  Pain is worse in the evening and prevents her from sleeping.  She has taken gabapentin 300mg  at bedtime but make feel groggy and has not provided any benefit. She has found a CBD derivative ointment which she applies 2-3 times a day, which seems to provide some relief.She has no weakness or imbalance. She will be celebrating 87th birthday this weekend.  UPDATE 04/11/2018:  She is here for follow-up visit.  At her last visit I tapered her off gabapentin and  started nortriptyline.  She did not tolerate nortriptyline and currently on taking any medications.  She has been going to a chiropractic management for laser treatment.  She takes lidocaine ointment which can provide some relief.  She was very afraid to take nortriptyline due to concerns of side effects and that it was a antidepressant. Overall, no change in her pain.  She also complains of bilateral thumb numbness.  This is been going on for some time.   Medications:  Current Outpatient Medications on File Prior to Visit  Medication Sig Dispense Refill  . albuterol (PROVENTIL) (2.5 MG/3ML) 0.083% nebulizer solution Take 3 mLs (2.5 mg total) by nebulization every 6 (six) hours as needed for wheezing or shortness of breath. 75 mL 12  . BREO ELLIPTA 100-25 MCG/INH AEPB TAKE 1 PUFF BY MOUTH EVERY DAY 60 each 3  . Calcium Carb-Cholecalciferol (CALCIUM 600 + D) 600-200 MG-UNIT TABS Take 2 tablets by mouth daily.     . Calcium Carbonate-Vitamin D (CALCIUM HIGH POTENCY/VITAMIN D) 600-200 MG-UNIT TABS Take by mouth.    . cetirizine (ZYRTEC) 10 MG tablet Take 10 mg by mouth every morning.    . fluticasone (FLONASE) 50 MCG/ACT nasal spray Place 1 spray into both nostrils 2 (two) times daily as needed for allergies.     Marland Kitchen gabapentin (NEURONTIN) 100 MG capsule Take 1-2 tablets at bedtime with your 300mg  tablets. 45 capsule 5  . gabapentin (NEURONTIN) 300 MG capsule Take 1 capsule (300 mg total) by mouth at bedtime. (Patient not  taking: Reported on 04/11/2018) 90 capsule 3  . latanoprost (XALATAN) 0.005 % ophthalmic solution Place 1 drop into both eyes at bedtime.    . Multiple Vitamin (MULTIVITAMIN) tablet Take 1 tablet by mouth every morning.     . nortriptyline (PAMELOR) 10 MG capsule TAKE 10 MG AT BEDTIME X 2 WEEKS THEN INCREASE TO 20 MG AT BEDTIME. (Patient not taking: Reported on 04/11/2018) 180 capsule 2  . PROAIR HFA 108 (90 Base) MCG/ACT inhaler INHALE 1-2 PUFFS INTO THE LUNGS EVERY 6 (SIX) HOURS AS  NEEDED FOR WHEEZING OR SHORTNESS OF BREATH. 8.5 Inhaler 2  . TURMERIC PO Take 1 capsule by mouth daily.    . vitamin B-12 (CYANOCOBALAMIN) 1000 MCG tablet Take by mouth.     Current Facility-Administered Medications on File Prior to Visit  Medication Dose Route Frequency Provider Last Rate Last Dose  . regadenoson (LEXISCAN) injection SOLN 0.4 mg  0.4 mg Intravenous Once Quintella Reichert, MD        Allergies:  Allergies  Allergen Reactions  . Other Shortness Of Breath    Pt states she has asthma and seafood causes breathing problems.   . Shellfish Allergy Shortness Of Breath  . Iodine Other (See Comments)    REACTION: " UNSURE TOLD TO LIST AS ALLERGEN BY MD" REACTION: " UNSURE TOLD TO LIST AS ALLERGEN BY MD"  . Latex Itching  . Adhesive [Tape] Rash    Review of Systems:  CONSTITUTIONAL: No fevers, chills, night sweats, or weight loss.  EYES: No visual changes or eye pain ENT: No hearing changes.  No history of nose bleeds.   RESPIRATORY: No cough, wheezing and shortness of breath.   CARDIOVASCULAR: Negative for chest pain, and palpitations.   GI: Negative for abdominal discomfort, blood in stools or black stools.  No recent change in bowel habits.   GU:  No history of incontinence.   MUSCLOSKELETAL: +history of joint pain or swelling.  No myalgias.   SKIN: Negative for lesions, rash, and itching.   ENDOCRINE: Negative for cold or heat intolerance, polydipsia or goiter.   PSYCH:  No depression or anxiety symptoms.   NEURO: As Above.   Vital Signs:  BP (!) 140/54   Pulse 86   Ht 5\' 7"  (1.702 m)   Wt 163 lb (73.9 kg)   SpO2 97%   BMI 25.53 kg/m    General Medical Exam:   General:  Well appearing, comfortable  Eyes/ENT: see cranial nerve examination.   Neck: No masses appreciated.  Full range of motion without tenderness.  No carotid bruits. Respiratory:  Clear to auscultation, good air entry bilaterally.   Cardiac:  Regular rate and rhythm, no murmur.   Ext:  No  edema  Neurological Exam: MENTAL STATUS including orientation to time, place, person, recent and remote memory, attention span and concentration, language, and fund of knowledge is normal.  Speech is not dysarthric.  CRANIAL NERVES: Face is symmetric.   MOTOR:  Motor strength is 5/5 in all extremities, including distally.  No atrophy, fasciculations or abnormal movements.  No pronator drift.  Tone is normal.    MSRs:  Reflexes are 2+/4 throughout, except absent bilateral Achilles.  SENSORY:  Vibration is absent at the great toe bilaterally, and intact above the ankles. Temperature and pinprick is also slightly dulled at the toes.Marland Kitchen  COORDINATION/GAIT:  Gait narrow based and stable.   Data: NCS/EMG of the left lower extremity 04/11/2017:  This is a normal study of the left lower  extremity. In particular, there is no evidence of a sensorimotor polyneuropathy or lumbosacral radiculopathy.  MRI left foot 02/19/2017: 1. Mild nonspecific soft tissue edema in the second toe with possible postsurgical changes in the flexor digitorum longus tendon. 2. No significant arthropathic changes or acute osseous findings in the second toe. There are mild hammertoe deformities of the digits.  3. Moderate degenerative changes at the first metatarsal phalangeal joint.  Lab Results  Component Value Date   VITAMINB12 1,489 (H) 12/13/2017   Lab Results  Component Value Date   FOLATE >24.1 12/13/2017     IMPRESSION/PLAN: 1.  Bilateral feet numbness, possible early signs of peripheral neuropathy. Her previous nerve conduction study performed in January 2019 was normal. There has been no progression of symptoms, therefore repeat EDX is of low value.  2.  Left second toe pain this most likely due to RSD as this started following surgery.    She developed side effects on subtherapeutic dose of gabapentin and nortriptyline.  She has a lot of anxiety about taking pain medications, but also states that she has  very low threshold for pain.  I explained that finding this balance is very important, none of the medications will be without side effects.  Medications are offered to her to improve quality of life and it is up to her whether she would like to try something.  Referral to Pain Management was discussed.  She would like to think about this.  3.  Bilateral thumb numbness, negative Tinel sign.  She is interested in electrodiagnostic testing of the hands to evaluate this further.  Further recommendations pending results.   Thank you for allowing me to participate in patient's care.  If I can answer any additional questions, I would be pleased to do so.    Sincerely,     K. Allena Katz, DO

## 2018-04-11 ENCOUNTER — Encounter: Payer: Self-pay | Admitting: Neurology

## 2018-04-11 ENCOUNTER — Ambulatory Visit: Payer: Medicare Other | Admitting: Neurology

## 2018-04-11 VITALS — BP 140/54 | HR 86 | Ht 67.0 in | Wt 163.0 lb

## 2018-04-11 DIAGNOSIS — M79675 Pain in left toe(s): Secondary | ICD-10-CM

## 2018-04-11 DIAGNOSIS — R200 Anesthesia of skin: Secondary | ICD-10-CM

## 2018-04-11 DIAGNOSIS — G9050 Complex regional pain syndrome I, unspecified: Secondary | ICD-10-CM | POA: Diagnosis not present

## 2018-04-11 DIAGNOSIS — R202 Paresthesia of skin: Secondary | ICD-10-CM | POA: Diagnosis not present

## 2018-04-11 DIAGNOSIS — G8929 Other chronic pain: Secondary | ICD-10-CM

## 2018-04-11 NOTE — Addendum Note (Signed)
Addended by: Clois Comber on: 04/11/2018 10:00 AM   Modules accepted: Orders

## 2018-04-11 NOTE — Patient Instructions (Addendum)
Continue nortriptyline 10mg  at bedtime NCS/EMG of the arms.  No lotion on the day of test Please let me know if you would like to see pain management for your toe pain  ELECTROMYOGRAM AND NERVE CONDUCTION STUDIES (EMG/NCS) INSTRUCTIONS  How to Prepare The neurologist conducting the EMG will need to know if you have certain medical conditions. Tell the neurologist and other EMG lab personnel if you: . Have a pacemaker or any other electrical medical device . Take blood-thinning medications . Have hemophilia, a blood-clotting disorder that causes prolonged bleeding Bathing Take a shower or bath shortly before your exam in order to remove oils from your skin. Don't apply lotions or creams before the exam.  What to Expect You'll likely be asked to change into a hospital gown for the procedure and lie down on an examination table. The following explanations can help you understand what will happen during the exam.  . Electrodes. The neurologist or a technician places surface electrodes at various locations on your skin depending on where you're experiencing symptoms. Or the neurologist may insert needle electrodes at different sites depending on your symptoms.  . Sensations. The electrodes will at times transmit a tiny electrical current that you may feel as a twinge or spasm. The needle electrode may cause discomfort or pain that usually ends shortly after the needle is removed. If you are concerned about discomfort or pain, you may want to talk to the neurologist about taking a short break during the exam.  . Instructions. During the needle EMG, the neurologist will assess whether there is any spontaneous electrical activity when the muscle is at rest - activity that isn't present in healthy muscle tissue - and the degree of activity when you slightly contract the muscle.  He or she will give you instructions on resting and contracting a muscle at appropriate times. Depending on what muscles and  nerves the neurologist is examining, he or she may ask you to change positions during the exam.  After your EMG You may experience some temporary, minor bruising where the needle electrode was inserted into your muscle. This bruising should fade within several days. If it persists, contact your primary care doctor.

## 2018-04-14 ENCOUNTER — Encounter: Payer: Self-pay | Admitting: Primary Care

## 2018-04-14 ENCOUNTER — Ambulatory Visit: Payer: Medicare Other | Admitting: Primary Care

## 2018-04-14 VITALS — BP 112/54 | HR 72 | Temp 97.9°F | Ht 67.0 in | Wt 163.4 lb

## 2018-04-14 DIAGNOSIS — J4531 Mild persistent asthma with (acute) exacerbation: Secondary | ICD-10-CM | POA: Diagnosis not present

## 2018-04-14 MED ORDER — PREDNISONE 10 MG PO TABS: ORAL_TABLET | ORAL | 0 refills | Status: DC

## 2018-04-14 MED ORDER — FLUTICASONE FUROATE-VILANTEROL 100-25 MCG/INH IN AEPB: 1.0000 | INHALATION_SPRAY | Freq: Every day | RESPIRATORY_TRACT | 1 refills | Status: DC

## 2018-04-14 NOTE — Assessment & Plan Note (Addendum)
Mild asthma exacerbation d/t sinusitis Restart BREO 1 puff daily Continue Proair 2 puffs every 6 hours as needed for shortness of breath or wheezing  RX Prednisone 20mg  x 5 days Call/return if not better in 4 days, consider CXR

## 2018-04-14 NOTE — Patient Instructions (Addendum)
Restart BREO - take 1 puff daily Continue Proair 2 puffs every 6 hours as needed for shortness of breath or wheezing   RX: Prednisone 20mg  x 5 days  Recommendations: Complete Augmentin as prescribed per ENT Make sure to stay well hydrated, aim for eight 8oz glasses of water daily  If not better by Friday 1/17 please call and will order CXR

## 2018-04-14 NOTE — Progress Notes (Signed)
@Patient  ID: Anne [name], female    DOB: [birth year], 85 y.o.   MRN: [id number]  Chief Complaint  Patient presents with  . Acute Visit    cough with clear mucus (was green), chest congestion x2 weeks, SOB yesterday,    Referring provider: Merlene Laughter, MD  HPI: 85 year old female, never smoked. PMH significant for mild persistent asthma, pulmonary nodule, GERD, dysphagia, De Quervain's syndrome, hypertension, diastolic dysfunction. Patient of Dr. Marchelle Gearing, last seen 12/17/17.  04/14/2018 Patient presents today for acute visit with complaints of cough, chest congestion and shortness of breath. Currently on Augmentin course prescribed by ENT. Cough has cleared from green to white/clear mucus. Continues to have some head and ear congestion. Taking Flonase and delsym. She has been experiencing increased shortness of breath since yesterday. Using proair rescue inhaler every 6 hours which has helped some. She was told that she couldn't take her BREO and rescue inahler together. Has not taken maintenance inhaler for 2 days. Afebrile.   Allergies  Allergen Reactions  . Other Shortness Of Breath    Pt states she has asthma and seafood causes breathing problems.   . Shellfish Allergy Shortness Of Breath  . Iodine Other (See Comments)    REACTION: " UNSURE TOLD TO LIST AS ALLERGEN BY MD" REACTION: " UNSURE TOLD TO LIST AS ALLERGEN BY MD"  . Latex Itching  . Adhesive [Tape] Rash    Immunization History  Administered Date(s) Administered  . Influenza Whole 04/04/2009  . Influenza, High Dose Seasonal PF 12/31/2016, 12/17/2017  . Influenza,inj,Quad PF,6+ Mos 01/03/2015, 12/02/2015  . Influenza-Unspecified 02/14/2014  . Pneumococcal Polysaccharide-23 06/01/2003  . Pneumococcal-Unspecified 01/03/2015    Past Medical History:  Diagnosis Date  . Allergic rhinitis   . Arthritis   . Asthma   . Atypical chest pain   . Collapse of right lung   . Dyslipidemia   . Gastroesophageal reflux  disease   . Glaucoma   . Hematuria   . Hyperlipidemia   . Lung nodules     Tobacco History: Social History   Tobacco Use  Smoking Status Never Smoker  Smokeless Tobacco Never Used   Counseling given: Not Answered   Outpatient Medications Prior to Visit  Medication Sig Dispense Refill  . albuterol (PROVENTIL) (2.5 MG/3ML) 0.083% nebulizer solution Take 3 mLs (2.5 mg total) by nebulization every 6 (six) hours as needed for wheezing or shortness of breath. 75 mL 12  . BREO ELLIPTA 100-25 MCG/INH AEPB TAKE 1 PUFF BY MOUTH EVERY DAY 60 each 3  . Calcium Carb-Cholecalciferol (CALCIUM 600 + D) 600-200 MG-UNIT TABS Take 2 tablets by mouth daily.     . Calcium Carbonate-Vitamin D (CALCIUM HIGH POTENCY/VITAMIN D) 600-200 MG-UNIT TABS Take by mouth.    . cetirizine (ZYRTEC) 10 MG tablet Take 10 mg by mouth every morning.    . fluticasone (FLONASE) 50 MCG/ACT nasal spray Place 1 spray into both nostrils 2 (two) times daily as needed for allergies.     Marland Kitchen gabapentin (NEURONTIN) 100 MG capsule Take 1-2 tablets at bedtime with your 300mg  tablets. 45 capsule 5  . gabapentin (NEURONTIN) 300 MG capsule Take 1 capsule (300 mg total) by mouth at bedtime. 90 capsule 3  . latanoprost (XALATAN) 0.005 % ophthalmic solution Place 1 drop into both eyes at bedtime.    . Multiple Vitamin (MULTIVITAMIN) tablet Take 1 tablet by mouth every morning.     Marland Kitchen PROAIR HFA 108 (90 Base) MCG/ACT inhaler INHALE 1-2 PUFFS INTO  THE LUNGS EVERY 6 (SIX) HOURS AS NEEDED FOR WHEEZING OR SHORTNESS OF BREATH. 8.5 Inhaler 2  . TURMERIC PO Take 1 capsule by mouth daily.    . vitamin B-12 (CYANOCOBALAMIN) 1000 MCG tablet Take by mouth.    . nortriptyline (PAMELOR) 10 MG capsule TAKE 10 MG AT BEDTIME X 2 WEEKS THEN INCREASE TO 20 MG AT BEDTIME. (Patient not taking: Reported on 04/14/2018) 180 capsule 2   Facility-Administered Medications Prior to Visit  Medication Dose Route Frequency Provider Last Rate Last Dose  . regadenoson  (LEXISCAN) injection SOLN 0.4 mg  0.4 mg Intravenous Once Quintella Reichert, MD        Review of Systems  Review of Systems  Constitutional: Negative.   HENT: Positive for congestion.   Respiratory: Positive for cough, shortness of breath and wheezing.   Cardiovascular: Negative.     Physical Exam  BP (!) 112/54   Pulse 72   Temp 97.9 F (36.6 C)   Ht 5\' 7"  (1.702 m)   Wt 163 lb 6.4 oz (74.1 kg)   SpO2 99%   BMI 25.59 kg/m  Physical Exam Constitutional:      Appearance: Normal appearance. She is normal weight.  HENT:     Head: Normocephalic and atraumatic.     Right Ear: Tympanic membrane normal.     Left Ear: Tympanic membrane normal.     Mouth/Throat:     Mouth: Mucous membranes are moist.     Pharynx: Oropharynx is clear.  Neck:     Musculoskeletal: Normal range of motion and neck supple.  Cardiovascular:     Rate and Rhythm: Normal rate and regular rhythm.  Pulmonary:     Effort: Pulmonary effort is normal. No respiratory distress.     Breath sounds: No wheezing or rhonchi.  Musculoskeletal: Normal range of motion.  Skin:    General: Skin is warm and dry.  Neurological:     General: No focal deficit present.     Mental Status: She is alert and oriented to person, place, and time. Mental status is at baseline.  Psychiatric:        Mood and Affect: Mood normal.        Behavior: Behavior normal.        Thought Content: Thought content normal.        Judgment: Judgment normal.      Lab Results:  CBC    Component Value Date/Time   WBC 7.0 01/14/2017 1453   RBC 3.96 01/14/2017 1453   HGB 12.3 01/14/2017 1453   HCT 36.9 01/14/2017 1453   PLT 290.0 01/14/2017 1453   MCV 93.2 01/14/2017 1453   MCH 30.3 03/17/2015 1120   MCHC 33.3 01/14/2017 1453   RDW 14.4 01/14/2017 1453   LYMPHSABS 2.4 01/14/2017 1453   MONOABS 0.7 01/14/2017 1453   EOSABS 0.2 01/14/2017 1453   BASOSABS 0.1 01/14/2017 1453    BMET    Component Value Date/Time   NA 137 01/14/2017  1453   K 4.4 01/14/2017 1453   CL 103 01/14/2017 1453   CO2 25 01/14/2017 1453   GLUCOSE 101 (H) 01/14/2017 1453   BUN 25 (H) 01/14/2017 1453   CREATININE 1.17 01/14/2017 1453   CALCIUM 8.9 01/14/2017 1453   GFRNONAA 52 (L) 03/17/2015 1120   GFRAA >60 03/17/2015 1120    BNP    Component Value Date/Time   BNP 103.9 (H) 03/17/2015 1120    ProBNP    Component Value Date/Time  PROBNP 65.0 01/14/2017 1453    Imaging: Dg Foot Complete Left  Result Date: 04/08/2018 Please see detailed radiograph report in office note.    Assessment & Plan:   Asthma Mild asthma exacerbation d/t sinusitis Restart BREO 1 puff daily Continue Proair 2 puffs every 6 hours as needed for shortness of breath or wheezing  RX Prednisone 20mg  x 5 days Call/return if not better in 4 days, consider CXR    Glenford Bayley, NP 04/14/2018

## 2018-04-15 ENCOUNTER — Ambulatory Visit: Payer: Medicare Other | Admitting: Podiatry

## 2018-04-24 ENCOUNTER — Encounter: Payer: Medicare Other | Admitting: Neurology

## 2018-04-29 ENCOUNTER — Ambulatory Visit (INDEPENDENT_AMBULATORY_CARE_PROVIDER_SITE_OTHER): Payer: Medicare Other | Admitting: Neurology

## 2018-04-29 DIAGNOSIS — G5603 Carpal tunnel syndrome, bilateral upper limbs: Secondary | ICD-10-CM

## 2018-04-29 DIAGNOSIS — R202 Paresthesia of skin: Secondary | ICD-10-CM | POA: Diagnosis not present

## 2018-04-29 NOTE — Procedures (Signed)
[facility]  [address], [address]  [place], [place] [address] Tel: [phone] Fax:  [phone] Test Date:  04/29/2018  Patient: Anne [name] DOB: 01/15/1931 Physician: Nita Sickle, DO  Sex: Female Height: 5\' 7"  Ref Phys: Nita Sickle, DO  ID#: [id number] Temp: 32.0C Technician:    Patient Complaints: This is an 85 year old female referred for evaluation of bilateral thumb numbness  NCV & EMG Findings: Extensive electrodiagnostic testing of the right upper extremity and additional studies of the left shows:  1. Right mixed palmar sensory responses show prolonged latency.  Left median sensory response shows prolonged latency (3.9 ms).  Right ulnar sensory response shows prolonged latency (3.8 ms).  Right median sensory response is within normal limits. 2. Bilateral median and left ulnar motor responses are within normal limits.  Right ulnar motor response shows mildly slowed conduction velocity across the elbow (A Elbow-B Elbow, 48 m/s).   3. There is no evidence of active or chronic motor axonal loss changes affecting any of the tested muscles.  Motor unit configuration and recruitment pattern is within normal limits.  Impression: 1. Bilateral median neuropathy at or distal to the wrist, consistent with a clinical diagnosis of carpal tunnel syndrome.  Overall, these findings are mild in degree electrically. 2. Right ulnar neuropathy with slowing across the elbow, purely demyelinating type and mild in degree electrically.   ___________________________ Nita Sickle, DO    Nerve Conduction Studies Anti Sensory Summary Table   Site NR Peak (ms) Norm Peak (ms) P-T Amp (V) Norm P-T Amp  Left Median Anti Sensory (2nd Digit)  32C  Wrist    3.9 <3.8 21.5 >10  Right Median Anti Sensory (2nd Digit)  32C  Wrist    3.8 <3.8 22.4 >10  Left Ulnar Anti Sensory (5th Digit)  32C  Wrist    3.1 <3.2 17.6 >5  Right Ulnar Anti Sensory (5th Digit)  32C  Wrist    3.8 <3.2  23.7 >5   Motor Summary Table   Site NR Onset (ms) Norm Onset (ms) O-P Amp (mV) Norm O-P Amp Site1 Site2 Delta-0 (ms) Dist (cm) Vel (m/s) Norm Vel (m/s)  Left Median Motor (Abd Poll Brev)  32C  Wrist    3.5 <4.0 9.7 >5 Elbow Wrist 5.4 32.0 59 >50  Elbow    8.9  7.9         Right Median Motor (Abd Poll Brev)  32C  Wrist    3.1 <4.0 9.2 >5 Elbow Wrist 5.2 30.0 58 >50  Elbow    8.3  8.0         Left Ulnar Motor (Abd Dig Minimi)  32C  Wrist    2.3 <3.1 7.1 >7 B Elbow Wrist 4.6 23.0 50 >50  B Elbow    6.9  6.1  A Elbow B Elbow 1.9 10.0 53 >50  A Elbow    8.8  5.8         Right Ulnar Motor (Abd Dig Minimi)  32C  Wrist    2.7 <3.1 7.3 >7 B Elbow Wrist 4.1 23.0 56 >50  B Elbow    6.8  6.3  A Elbow B Elbow 2.1 10.0 48 >50  A Elbow    8.9  6.1          Comparison Summary Table   Site NR Peak (ms) Norm Peak (ms) P-T Amp (V) Site1 Site2 Delta-P (ms) Norm Delta (ms)  Right Median/Ulnar Palm Comparison (Wrist - 8cm)  32C  Median Palm    2.3 <2.2 22.6 Median Palm Ulnar Palm 0.2   Ulnar Palm    2.1 <2.2 12.4       EMG   Side Muscle Ins Act Fibs Psw Fasc Number Recrt Dur Dur. Amp Amp. Poly Poly. Comment  Right 1stDorInt Nml Nml Nml Nml Nml Nml Nml Nml Nml Nml Nml Nml N/A  Right Abd Poll Brev Nml Nml Nml Nml Nml Nml Nml Nml Nml Nml Nml Nml N/A  Right PronatorTeres Nml Nml Nml Nml Nml Nml Nml Nml Nml Nml Nml Nml N/A  Right Biceps Nml Nml Nml Nml Nml Nml Nml Nml Nml Nml Nml Nml N/A  Right Triceps Nml Nml Nml Nml Nml Nml Nml Nml Nml Nml Nml Nml N/A  Right Deltoid Nml Nml Nml Nml Nml Nml Nml Nml Nml Nml Nml Nml N/A  Right FlexCarpiUln Nml Nml Nml Nml Nml Nml Nml Nml Nml Nml Nml Nml N/A  Left 1stDorInt Nml Nml Nml Nml Nml Nml Nml Nml Nml Nml Nml Nml N/A  Left Abd Poll Brev Nml Nml Nml Nml Nml Nml Nml Nml Nml Nml Nml Nml N/A  Left PronatorTeres Nml Nml Nml Nml Nml Nml Nml Nml Nml Nml Nml Nml N/A  Left Biceps Nml Nml Nml Nml Nml Nml Nml Nml Nml Nml Nml Nml N/A  Left Triceps Nml Nml Nml Nml Nml  Nml Nml Nml Nml Nml Nml Nml N/A  Left Deltoid Nml Nml Nml Nml Nml Nml Nml Nml Nml Nml Nml Nml N/A      Waveforms:

## 2018-04-30 ENCOUNTER — Telehealth: Payer: Self-pay | Admitting: *Deleted

## 2018-04-30 ENCOUNTER — Encounter: Payer: Self-pay | Admitting: *Deleted

## 2018-04-30 NOTE — Telephone Encounter (Signed)
Letter sent giving results and instructions.  Referral to [facility] also sent.  Requested for patient to call and schedule her 3 month follow up.

## 2018-04-30 NOTE — Telephone Encounter (Signed)
-----   Message from [name], DO sent at 04/30/2018  8:13 AM EST ----- Please inform patient that she has very mild carpal tunnel syndrome, causing her thumb numbness.  It's important to avoid over flexing at the wrist, such as when sleeping.  Start using a wrist splint at night time to keeps wrists straight - ok to refer to biotech.  Thanks.  She can follow-up in 3 months.

## 2018-06-02 ENCOUNTER — Telehealth: Payer: Self-pay | Admitting: Neurology

## 2018-06-02 NOTE — Telephone Encounter (Signed)
Patient needs to talk to someone about her left toe pain and wants to know if she can have surgery on it. She also would like to talk about her medication not working,( nortriptyline )

## 2018-06-03 NOTE — Telephone Encounter (Signed)
Left message for patient to call me back. 

## 2018-06-04 NOTE — Telephone Encounter (Signed)
Patient called and left a voicemail late yesterday for me to call her back.  Called patient again and left another message for her to call me back.

## 2018-06-04 NOTE — Telephone Encounter (Signed)
Patient has been on the 20 mg of nortriptyline for about 3 weeks now and has not seen a difference.  Can we increase?

## 2018-06-04 NOTE — Telephone Encounter (Signed)
yall are playing phone tag. Michell left vm that she was calling you back.

## 2018-06-05 NOTE — Telephone Encounter (Signed)
Please explain these medication can take some time to see effect, would recommend staying on nortriptyline for total of 6-8 weeks, stay nortriptyline 20mg  for now. We can increase in 1 month to 30mg  as needed.

## 2018-06-06 NOTE — Telephone Encounter (Signed)
Patient given instructions and agreed with plan.  

## 2018-06-12 ENCOUNTER — Ambulatory Visit: Payer: Medicare Other | Admitting: Podiatry

## 2018-06-18 ENCOUNTER — Telehealth: Payer: Self-pay | Admitting: Podiatry

## 2018-06-18 NOTE — Telephone Encounter (Signed)
Patient was scheduled for appt 06/19/18 to see Dr Milinda Pointer to discuss pain in her left 2nd toe. Pt cancelled the appt due to [facility] Virus and wanted to relay a message to the doctor. She is interested in surgery but wanted to see what the doctor thought since she has neuropathy. Pt was told that she would need to come in for consult with the doctor to discuss and still insisted that the message be given to the doctor before she reschedules.

## 2018-06-18 NOTE — Telephone Encounter (Signed)
Its ok to do surgery with neuropathy.

## 2018-06-19 ENCOUNTER — Ambulatory Visit: Payer: Medicare Other | Admitting: Podiatry

## 2018-06-23 ENCOUNTER — Telehealth: Payer: Self-pay | Admitting: Internal Medicine

## 2018-06-23 ENCOUNTER — Other Ambulatory Visit: Payer: Self-pay | Admitting: Internal Medicine

## 2018-06-23 MED ORDER — FLUTICASONE FUROATE-VILANTEROL 100-25 MCG/INH IN AEPB: 1.0000 | INHALATION_SPRAY | Freq: Every day | RESPIRATORY_TRACT | 2 refills | Status: DC

## 2018-06-23 NOTE — Telephone Encounter (Signed)
Left message requested refill of Breo 1000 sent to patient local [facility] pharmacy. Nothing further needed.

## 2018-06-24 NOTE — Telephone Encounter (Signed)
Pt called and stated she had some questions about surgery.

## 2018-06-24 NOTE — Telephone Encounter (Signed)
I informed pt Dr. Milinda Pointer had said she could have surgery with neuropathy, and that we were scheduling consultation visits on mid-May, due to CDC restrictions. Pt states she will check her schedule and call again.

## 2018-07-14 ENCOUNTER — Telehealth: Payer: Self-pay | Admitting: Internal Medicine

## 2018-07-14 NOTE — Telephone Encounter (Signed)
Returned call regarding Breo.  Patient states she normally get 5 or 6 refills when she see Dr. Chase Caller and last time she only got 3 refills.  Informed patient that since she did not have visit with Ramaswamy due to the COVID cancellation that she would still be due for a visit soon with him and we lowered the refills most likely based on her having an upcoming appointment.  Reassured patient that when she needed refills she could call or ask her pharmacy to request a refill and it would not be a problem.  Also reminded patient that we would send her a reminder in June to make an appointment with Dr. Chase Caller once he has returned to the office. She acknowledged understanding of information and a recall has been placed for a July appt.  Nothing further needed.

## 2018-07-30 ENCOUNTER — Telehealth: Payer: Self-pay | Admitting: Neurology

## 2018-07-30 NOTE — Telephone Encounter (Signed)
Patient called regarding her medication Nortriptyline. She picked up her medication and she said she was reading the instructions and they are the same as it was 3 months ago? Please Call. She has some questions. Thanks

## 2018-07-30 NOTE — Telephone Encounter (Signed)
Called patient and left message for her to call me back.

## 2018-08-01 NOTE — Telephone Encounter (Signed)
I spoke with patient and she was confused because her Rx still said to start at 10 mg and move up to 20 mg.  Instructed her to stay at the 20 mg.

## 2018-09-24 ENCOUNTER — Telehealth: Payer: Self-pay | Admitting: Neurology

## 2018-09-24 NOTE — Telephone Encounter (Signed)
Left message for pt to return call.

## 2018-09-24 NOTE — Telephone Encounter (Signed)
Taking Nortriptyline 20mg  qhs  C/O numbness in right hand and left foot  Wants to know if she should increase medication.  She has been taking 20mg  for one month or more.  Feels like the numbness is getting worse in her toes and thumbs.  Never seen pain management- doesn't think she needs. More concerned about numbness.  Does not need a refill of Nortriptyline.

## 2018-09-24 NOTE — Telephone Encounter (Signed)
Her nerve testing from earlier this year shows that she has mild carpal tunnel syndrome in both hands, which is most likely causing her numbness in thumbs.  Please encourage to start using a wrist splint to minimize wrist flexion, which will make carpal tunnel syndrome worse.  If she has been using this and it is not helping, then the next step is Hand PT.   Unfortunately, none of the medication is effective for numbness, so I do not recommend increasing nortriptyline.  Medications will only help with burning, stabbing, and tingling pain.  If she is experiencing this, we can increase nortriptyline to 25mg  at bedtime.  She was last seen in January 2020 and does not have f/u appointment.  She is welcome to schedule a e-visit to discuss further.  Donika K. Posey Pronto, DO

## 2018-09-24 NOTE — Telephone Encounter (Signed)
She is not going to increase Nortriptyline because she does not have that much pain.  Pt does not believe she has carpal tunnel. States that she has seen a hand specialist since last visit and was told that she did not have it.  Pt will continue what she is doing for now and if symptoms get worse she will call back. She did not want to schedule a evisit or follow up at this time.

## 2018-09-24 NOTE — Telephone Encounter (Signed)
QUESTIONS concerning medications and "some other questions for Patel's assistant" Please return call [phone].

## 2018-09-30 ENCOUNTER — Other Ambulatory Visit: Payer: Self-pay | Admitting: Primary Care

## 2018-10-07 ENCOUNTER — Telehealth: Payer: Self-pay | Admitting: Neurology

## 2018-10-07 NOTE — Telephone Encounter (Signed)
Patient wants to know if she should do the surgery for her hammer toe since she has neuropathy please call

## 2018-10-08 NOTE — Telephone Encounter (Signed)
Informed patient that Dr Posey Pronto was ok with her having surgery.

## 2018-10-08 NOTE — Telephone Encounter (Signed)
OK for her to have surgery for hammertoe.

## 2018-10-17 ENCOUNTER — Other Ambulatory Visit: Payer: Self-pay | Admitting: Geriatric Medicine

## 2018-10-17 DIAGNOSIS — Z1231 Encounter for screening mammogram for malignant neoplasm of breast: Secondary | ICD-10-CM

## 2018-11-04 ENCOUNTER — Telehealth: Payer: Self-pay | Admitting: Neurology

## 2018-11-04 NOTE — Telephone Encounter (Signed)
Patient has some questions about how to take her new Nortriptyline medication. She just got it from the pharm. Thanks!

## 2018-11-04 NOTE — Telephone Encounter (Addendum)
Called patient back about Nortriptyline medication she called about this am. Looks like was ordered 01/22/18. Patient's message stated she picked up this am and had a question about the dosage. In 6/24 telephone note per Dr. Posey Pronto if she is experiencing burning, stabbing, or tingling pain she could increase the Nortriptyline to 25mg  at bedtime. At that time per note patient did not want to increase dose so should still be at 20mg  QHS.   Left message on patient's machine that when she returns my call r/t meds that she also needs to make 6 mos follow up appt with MD as she was last seen in January.

## 2018-11-04 NOTE — Telephone Encounter (Signed)
Patient returned call and stated on her new Nortriptyline bottle she picked up today it had the instructions to start with 10mg  and increase to 20mg  QHS. These instructions were from the original prescription 01/22/18 (patient was aware of that she just wanted to confirm). She has been taking 20mg  nightly. I asked patient if she is having burning, stabbing, or tingling pain (would increase medication if so per 6/24 MD telephone note). She is not having pain but numbness in both feet (toes) and the thumb on her right hand. Informed her if any medication changes are recommended by MD I will call her back otherwise she should make follow up appt with Dr. Posey Pronto. Patient verbalized understanding and will make appt.

## 2018-11-06 NOTE — Telephone Encounter (Signed)
Called and left message with patient confirming to take the 20mg  Nortriptyline at bedtime.

## 2018-11-06 NOTE — Telephone Encounter (Signed)
Please inform patient to take nortriptyline 20mg  at bedtime.  Thanks.

## 2018-11-11 ENCOUNTER — Other Ambulatory Visit: Payer: Self-pay

## 2018-11-11 ENCOUNTER — Ambulatory Visit: Payer: Medicare Other

## 2018-11-11 ENCOUNTER — Ambulatory Visit (INDEPENDENT_AMBULATORY_CARE_PROVIDER_SITE_OTHER): Payer: Medicare Other | Admitting: Podiatry

## 2018-11-11 ENCOUNTER — Encounter: Payer: Self-pay | Admitting: Podiatry

## 2018-11-11 DIAGNOSIS — M19079 Primary osteoarthritis, unspecified ankle and foot: Secondary | ICD-10-CM

## 2018-11-11 DIAGNOSIS — M2012 Hallux valgus (acquired), left foot: Secondary | ICD-10-CM

## 2018-11-11 DIAGNOSIS — M2042 Other hammer toe(s) (acquired), left foot: Secondary | ICD-10-CM

## 2018-11-12 ENCOUNTER — Encounter: Payer: Self-pay | Admitting: Podiatry

## 2018-11-12 NOTE — Progress Notes (Signed)
She presents today for follow-up of a hammertoe second toe left states that he did a tenotomy on this toe back a while back and this does not seem to be right eyes no exit needs to know exactly what needs to be done he gives me some trouble occasionally.  Objective: Vital signs are stable she is alert and oriented x3.  Pulses are palpable.  Neurologic sensorium is intact deep tendon reflexes are intact muscle strength is normal symmetrical.  She has flexible hammertoe deformities 3 through 5 bilateral with some rigidity at the level of the PIPJ to the second digits bilaterally.  Unfortunately she does have hallux valgus to the left foot which is resulting in some dorsiflexion of the second metatarsal phalangeal joint of the left foot which is the one we tenotomized 2 years ago.  Assessment: Hallux valgus deformity resulting in hammertoe deformity second left.  Plan: Discussed etiology pathology conservative versus surgical therapies expressed to her that it would be necessary to perform a bunion repair however at her age we cannot guarantee that the bone would heal appropriately and that she would tolerate anesthesia well I asked asked her to follow-up with her primary doctor for these questions and will notify us with future questions or concerns.

## 2018-12-03 ENCOUNTER — Other Ambulatory Visit: Payer: Self-pay

## 2018-12-03 ENCOUNTER — Ambulatory Visit
Admission: RE | Admit: 2018-12-03 | Discharge: 2018-12-03 | Disposition: A | Discharge status: Home / Self Care | Payer: Medicare Other | Source: Ambulatory Visit | Attending: Geriatric Medicine | Admitting: Geriatric Medicine

## 2018-12-03 DIAGNOSIS — Z1231 Encounter for screening mammogram for malignant neoplasm of breast: Secondary | ICD-10-CM

## 2018-12-22 ENCOUNTER — Other Ambulatory Visit: Payer: Self-pay

## 2018-12-22 DIAGNOSIS — I872 Venous insufficiency (chronic) (peripheral): Secondary | ICD-10-CM

## 2018-12-23 ENCOUNTER — Ambulatory Visit (INDEPENDENT_AMBULATORY_CARE_PROVIDER_SITE_OTHER): Payer: Medicare Other | Admitting: Vascular Surgery

## 2018-12-23 ENCOUNTER — Ambulatory Visit (HOSPITAL_COMMUNITY): Payer: Medicare Other

## 2018-12-23 ENCOUNTER — Ambulatory Visit (HOSPITAL_COMMUNITY)
Admission: RE | Admit: 2018-12-23 | Discharge: 2018-12-23 | Disposition: A | Discharge status: Home / Self Care | Payer: Medicare Other | Source: Ambulatory Visit | Attending: Family | Admitting: Family

## 2018-12-23 ENCOUNTER — Encounter: Payer: Self-pay | Admitting: Vascular Surgery

## 2018-12-23 ENCOUNTER — Other Ambulatory Visit: Payer: Self-pay

## 2018-12-23 VITALS — BP 152/73 | HR 78 | Temp 97.5°F | Resp 18 | Ht 67.0 in | Wt 164.0 lb

## 2018-12-23 DIAGNOSIS — I872 Venous insufficiency (chronic) (peripheral): Secondary | ICD-10-CM | POA: Insufficient documentation

## 2018-12-23 NOTE — Progress Notes (Signed)
Vascular and Vein Specialist of [facility]  Patient name: Anne [name] MRN: [id number] DOB: 06-Mar-1931 Sex: female  REASON FOR CONSULT: Evaluate lower extremity discomfort and venous insufficiency  HPI: Anne [name] is a 85 y.o. female, who is here today for evaluation of lower extremity symptoms.  She has multiple issues.  She does have some mild peripheral neuropathy.  She remains quite active and appears younger than the stated age of 57.  She does report that she had some discomfort over the lateral aspect of her left ankle and this is improved with topical analgesic.  She reports that she has been walking on her treadmill and this is improved her symptoms as well.  She does not have any specific lower extremity swelling.  She does have some issues regarding her left foot.  She has had a hammertoe repair on her left second toe in the past and is contemplating bunion removal on her great toe and recurrent operation on her left second toe.  She is concerned regarding healing of this.  She does not have any significant varicosities but does have some changes in the skin on her lateral left ankle.  Past Medical History:  Diagnosis Date  . Allergic rhinitis   . Arthritis   . Asthma   . Atypical chest pain   . Collapse of right lung   . Dyslipidemia   . Gastroesophageal reflux disease   . Glaucoma   . Hematuria   . Hyperlipidemia   . Lung nodules     Family History  Problem Relation Age of Onset  . Cancer Mother   . Heart disease Father   . Breast cancer Cousin   . Heart attack Neg Hx     SOCIAL HISTORY: Social History   Socioeconomic History  . Marital status: Married    Spouse name: Not on file  . Number of children: Not on file  . Years of education: Not on file  . Highest education level: Not on file  Occupational History  . Occupation: retired  Engineer, production  . Financial resource strain: Not on file  . Food insecurity    Worry:  Not on file    Inability: Not on file  . Transportation needs    Medical: Not on file    Non-medical: Not on file  Tobacco Use  . Smoking status: Never Smoker  . Smokeless tobacco: Never Used  Substance and Sexual Activity  . Alcohol use: No    Alcohol/week: 0.0 standard drinks  . Drug use: No  . Sexual activity: Never    Birth control/protection: Surgical  Lifestyle  . Physical activity    Days per week: Not on file    Minutes per session: Not on file  . Stress: Not on file  Relationships  . Social Musician on phone: Not on file    Gets together: Not on file    Attends religious service: Not on file    Active member of club or organization: Not on file    Attends meetings of clubs or organizations: Not on file    Relationship status: Not on file  . Intimate partner violence    Fear of current or ex partner: Not on file    Emotionally abused: Not on file    Physically abused: Not on file    Forced sexual activity: Not on file  Other Topics Concern  . Not on file  Social History Narrative   Retired  guidance counselor.  Lives alone, husband passed away in 02-27-17.  No children.    Allergies  Allergen Reactions  . Other Shortness Of Breath    Pt states she has asthma and seafood causes breathing problems.   . Shellfish Allergy Shortness Of Breath  . Iodine Other (See Comments)    REACTION: " UNSURE TOLD TO LIST AS ALLERGEN BY MD" REACTION: " UNSURE TOLD TO LIST AS ALLERGEN BY MD"  . Latex Itching  . Adhesive [Tape] Rash    Current Outpatient Medications  Medication Sig Dispense Refill  . albuterol (PROVENTIL) (2.5 MG/3ML) 0.083% nebulizer solution Take 3 mLs (2.5 mg total) by nebulization every 6 (six) hours as needed for wheezing or shortness of breath. 75 mL 12  . BREO ELLIPTA 100-25 MCG/INH AEPB TAKE 1 PUFF BY MOUTH EVERY DAY 60 each 3  . Calcium Carb-Cholecalciferol (CALCIUM 600 + D) 600-200 MG-UNIT TABS Take 2 tablets by mouth daily.     . Calcium  Carbonate-Vitamin D (CALCIUM HIGH POTENCY/VITAMIN D) 600-200 MG-UNIT TABS Take by mouth.    . cetirizine (ZYRTEC) 10 MG tablet Take 10 mg by mouth every morning.    . fluticasone (FLONASE) 50 MCG/ACT nasal spray Place 1 spray into both nostrils 2 (two) times daily as needed for allergies.     Marland Kitchen latanoprost (XALATAN) 0.005 % ophthalmic solution Place 1 drop into both eyes at bedtime.    . Multiple Vitamin (MULTIVITAMIN) tablet Take 1 tablet by mouth every morning.     . nortriptyline (PAMELOR) 10 MG capsule TAKE 10 MG AT BEDTIME X 2 WEEKS THEN INCREASE TO 20 MG AT BEDTIME. 180 capsule 2  . TURMERIC PO Take 1 capsule by mouth daily.    . vitamin B-12 (CYANOCOBALAMIN) 1000 MCG tablet Take by mouth.    Marland Kitchen PROAIR HFA 108 (90 Base) MCG/ACT inhaler INHALE 1-2 PUFFS INTO THE LUNGS EVERY 6 (SIX) HOURS AS NEEDED FOR WHEEZING OR SHORTNESS OF BREATH. (Patient not taking: Reported on 12/23/2018) 8.5 Inhaler 2   No current facility-administered medications for this visit.    Facility-Administered Medications Ordered in Other Visits  Medication Dose Route Frequency Provider Last Rate Last Dose  . regadenoson (LEXISCAN) injection SOLN 0.4 mg  0.4 mg Intravenous Once Turner, Cornelious Bryant, MD        REVIEW OF SYSTEMS:  [X]  denotes positive finding, [ ]  denotes negative finding Cardiac  Comments:  Chest pain or chest pressure:    Shortness of breath upon exertion:    Short of breath when lying flat:    Irregular heart rhythm:        Vascular    Pain in calf, thigh, or hip brought on by ambulation: x   Pain in feet at night that wakes you up from your sleep:  x   Blood clot in your veins:    Leg swelling:         Pulmonary    Oxygen at home:    Productive cough:     Wheezing:         Neurologic    Sudden weakness in arms or legs:     Sudden numbness in arms or legs:     Sudden onset of difficulty speaking or slurred speech:    Temporary loss of vision in one eye:     Problems with dizziness:          Gastrointestinal    Blood in stool:     Vomited blood:  Genitourinary    Burning when urinating:     Blood in urine:        Psychiatric    Major depression:         Hematologic    Bleeding problems:    Problems with blood clotting too easily:        Skin    Rashes or ulcers:        Constitutional    Fever or chills:      PHYSICAL EXAM: Vitals:   12/23/18 1353  BP: (!) 152/73  Pulse: 78  Resp: 18  Temp: (!) 97.5 F (36.4 C)  SpO2: 98%  Weight: 164 lb (74.4 kg)  Height: 5\' 7"  (1.702 m)    GENERAL: The patient is a well-nourished female, in no acute distress. The vital signs are documented above. CARDIOVASCULAR: 2+ dorsalis pedis and posterior tibial pulses bilaterally.  Mild scattered small varicosities on her medial left calf. PULMONARY: There is good air exchange  ABDOMEN: Soft and non-tender  MUSCULOSKELETAL: There are no major deformities or cyanosis. NEUROLOGIC: No focal weakness or paresthesias are detected. SKIN: There are no ulcers or rashes noted.  Some thickening and mild discoloration on the lateral left ankle above her lateral malleolus no ulceration PSYCHIATRIC: The patient has a normal affect.  DATA:  No evidence of DVT on her venous duplex.  She does have some reflux in her great saphenous vein and also in her left popliteal vein.  Her saphenous vein is not enlarged  MEDICAL ISSUES: Had a long discussion with the patient.  I do not feel that she has any significant arterial disease with normal arterial pulse exam.  She is concerned regarding healing from her possible left foot surgery and I explained that this should not be an issue regarding her arterial flow.  She is consenting to consider whether she wants to proceed or not.  She does have mild venous insufficiency.  We did discuss elevation and compression stockings.  Without a great deal of swelling I am not sure that she would benefit enough from stockings that it would be worth the  aggravation to wearing them.  She will continue to monitor this and wear her stockings if she is having increased difficulty.  She was reassured with this discussion will see [place] again on an as-needed basis   Larina Earthly, MD [facility] Vascular and Vein Specialists of [facility] Tel [phone] Pager [phone]

## 2019-01-01 ENCOUNTER — Other Ambulatory Visit: Payer: Self-pay | Admitting: Internal Medicine

## 2019-01-05 ENCOUNTER — Telehealth: Payer: Self-pay | Admitting: Internal Medicine

## 2019-01-05 MED ORDER — BREO ELLIPTA 100-25 MCG/INH IN AEPB: 1.0000 | INHALATION_SPRAY | Freq: Every day | RESPIRATORY_TRACT | 6 refills | Status: AC

## 2019-01-05 NOTE — Telephone Encounter (Signed)
Call returned to patient, confirmed DOB, medication, and pharmacy. Refill sent. F/U appt made. Nothing further needed at this time.

## 2019-01-12 ENCOUNTER — Other Ambulatory Visit: Payer: Self-pay

## 2019-01-12 ENCOUNTER — Telehealth: Payer: Self-pay | Admitting: Neurology

## 2019-01-12 MED ORDER — NORTRIPTYLINE HCL 10 MG PO CAPS: ORAL_CAPSULE | ORAL | 3 refills | Status: DC

## 2019-01-12 NOTE — Telephone Encounter (Signed)
Patient is needing a refill on the nortriptyline medication to the pharm on file -90 day supply.

## 2019-01-15 ENCOUNTER — Ambulatory Visit: Payer: Medicare Other | Admitting: Internal Medicine

## 2019-01-15 ENCOUNTER — Other Ambulatory Visit: Payer: Self-pay

## 2019-01-15 ENCOUNTER — Encounter: Payer: Self-pay | Admitting: Internal Medicine

## 2019-01-15 VITALS — BP 130/62 | HR 79 | Temp 97.0°F | Ht 67.0 in | Wt 164.6 lb

## 2019-01-15 DIAGNOSIS — J4531 Mild persistent asthma with (acute) exacerbation: Secondary | ICD-10-CM

## 2019-01-15 NOTE — Progress Notes (Signed)
HPI Chief Complaint  Patient presents with  . Acute Visit    cough     Referring provider: Merlene Laughter, MD  HPI: 85 year old female never smoker followed for mild persistent asthma History of diastolic dysfunction Known chronic right middle lobe collapse/ lymphadenopathy -s/p 07/2007 FOB +psuedomonas  Test Exhaled nitric oxide test May 2018: 17 ppb High resolution CT chest August 2016 showed no evidence of interstitial lung disease, chronic collapse of right middle lobe MEdiastinaoscopy 07/27/2009 - Benign node of 4R and 4L. ANthracotoic pigment +. No granuoma. Non malignant PFT 10/28/2014 FEV1 94, ratio 54, FVC 135, no significant bronchodilator response, mid flows reversibility, DLCO 65%   04/25/2017 Acute OV : Asthma  Patient presents for an acute office visit.  She complains of 3 weeks of cough ,congestion sinus congestion, drainage , wheezing . Seen PCP dx w/ bronchitis , rx  , Doxycycline and Prednisone . Feels some better but still has chest congestion and tightness .  She remains on Breo daily.    Appetite is okay w/ no nv/d.  No chest pain, orthopnea or ede   OV 06/05/2017  Chief Complaint  Patient presents with  . Follow-up    Pt states she has been doing good since last visit. Pt states she does become SOB with exertion. Denies any cough or CP.    Fu Mild persiostent asthma with associated diastolic dysfunction  Contines on broe. Overall stable. But last  4 month has has had 2 surgeries for hammer toe on left food. So less active. Correlating with this more dyspnea on exertion than usual. Also, found to have mold in the bathroom and professionally removed . Has not affected her but she is asking for feno and walk test both of which aare normal. She wants sample breo       OV 12/17/2017  Subjective:  Patient ID: Anne [name], female , DOB: 07-Nov-1930 , age 89 y.o. , MRN: [id number] , ADDRESS: [address] [place] [address]   12/17/2017 -    Chief Complaint  Patient presents with  . Follow-up    Pt states she has been doing well since last visit and denies any complaints.   Fu Mild persiostent asthma with associated diastolic dysfunction  HPI Anne [name] 85 y.o. -  Mild persistent asthma follow-up. Recently she was switched to Brio on Qvar. This is helped his symptoms. In addition she is in the mold in the house.She feels great. She is somewhat she has done well. No interim asthma exacerbations. She will have a high dose flu shot today. There are really no other issues other than some mild recurrence of costochondral chest pain in the last few days is a recurrent problem for her.     Results for KELIN, BOZEK (MRN [id number]) as of 06/05/2017 14:14  Ref. Range 08/07/2016 12:01 02/12/2017 11:30 06/05/2017 13:04  Nitric Oxide Unknown 17 18 20    OV 01/15/2019  Subjective:  Patient ID: Anne [name], female , DOB: 1930/10/08 , age 50 y.o. , MRN: [id number] , ADDRESS: [address] [place] [address]   01/15/2019 -   Chief Complaint  Patient presents with  . Follow-up    Pt states her breathing is doing well. Pt denies any increase in SOB, cough, CP/tightness, f/c/s, body aches.    85 year old female never smoker followed for mild persistent asthma History of diastolic dysfunction Known chronic right middle lobe collapse/ lymphadenopathy -s/p 07/2007 FOB +psuedomonas - MEdiastinaoscopy 07/27/2009 -  Benign node of 4R and 4L. ANthracotoic pigment +. No granuoma. Non malignant   HPI Anne [name] 85 y.o. -presents for follow-up.  Last seen September 2019 by myself.  In the interim visit with the nurse practitioner.  She says the Brio works well for her.  She says asthma is really well controlled.  No new symptoms.  She is up-to-date with the flu shot.  She is extremely active.  She is social distancing and isolating quite well and only doing low risk COVID-19 activities.  There are no new complaints or issues.     ROS -  per HPI     has a past medical history of Allergic rhinitis, Arthritis, Asthma, Atypical chest pain, Collapse of right lung, Dyslipidemia, Gastroesophageal reflux disease, Glaucoma, Hematuria, Hyperlipidemia, and Lung nodules.   reports that she has never smoked. She has never used smokeless tobacco.  Past Surgical History:  Procedure Laterality Date  . ANAL FISSURE REPAIR    . BREAST BIOPSY Right   . CATARACT EXTRACTION Bilateral   . MASS EXCISION  04/11/2012   Procedure: EXCISION MASS;  Surgeon: Nicki Reaper, MD;  Location: [facility];  Service: Orthopedics;  Laterality: Right;  Excision of mass right upper arm  . MEDIASTINOSCOPY  4/11  . NASAL SINUS SURGERY    . TOTAL ABDOMINAL HYSTERECTOMY W/ BILATERAL SALPINGOOPHORECTOMY      Allergies  Allergen Reactions  . Other Shortness Of Breath    Pt states she has asthma and seafood causes breathing problems.   . Shellfish Allergy Shortness Of Breath  . Iodine Other (See Comments)    REACTION: " UNSURE TOLD TO LIST AS ALLERGEN BY MD" REACTION: " UNSURE TOLD TO LIST AS ALLERGEN BY MD"  . Latex Itching  . Adhesive [Tape] Rash    Immunization History  Administered Date(s) Administered  . Influenza Whole 04/04/2009  . Influenza, High Dose Seasonal PF 12/31/2016, 12/17/2017, 12/02/2018  . Influenza,inj,Quad PF,6+ Mos 01/03/2015, 12/02/2015  . Influenza-Unspecified 02/14/2014  . Pneumococcal Polysaccharide-23 06/01/2003  . Pneumococcal-Unspecified 01/03/2015    Family History  Problem Relation Age of Onset  . Cancer Mother   . Heart disease Father   . Breast cancer Cousin   . Heart attack Neg Hx      Current Outpatient Medications:  .  albuterol (PROVENTIL) (2.5 MG/3ML) 0.083% nebulizer solution, Take 3 mLs (2.5 mg total) by nebulization every 6 (six) hours as needed for wheezing or shortness of breath., Disp: 75 mL, Rfl: 12 .  Calcium Carb-Cholecalciferol (CALCIUM 600 + D) 600-200 MG-UNIT TABS, Take 2 tablets  by mouth daily. , Disp: , Rfl:  .  Calcium Carbonate-Vitamin D (CALCIUM HIGH POTENCY/VITAMIN D) 600-200 MG-UNIT TABS, Take by mouth., Disp: , Rfl:  .  cetirizine (ZYRTEC) 10 MG tablet, Take 10 mg by mouth every morning., Disp: , Rfl:  .  fluticasone (FLONASE) 50 MCG/ACT nasal spray, Place 1 spray into both nostrils 2 (two) times daily as needed for allergies. , Disp: , Rfl:  .  fluticasone furoate-vilanterol (BREO ELLIPTA) 100-25 MCG/INH AEPB, Inhale 1 puff into the lungs daily., Disp: 30 each, Rfl: 6 .  latanoprost (XALATAN) 0.005 % ophthalmic solution, Place 1 drop into both eyes at bedtime., Disp: , Rfl:  .  Multiple Vitamin (MULTIVITAMIN) tablet, Take 1 tablet by mouth every morning. , Disp: , Rfl:  .  nortriptyline (PAMELOR) 10 MG capsule, Take 2 tablets at bedtime, Disp: 180 capsule, Rfl: 3 .  PROAIR HFA 108 (90 Base)  MCG/ACT inhaler, INHALE 1-2 PUFFS INTO THE LUNGS EVERY 6 (SIX) HOURS AS NEEDED FOR WHEEZING OR SHORTNESS OF BREATH., Disp: 8.5 Inhaler, Rfl: 2 .  TURMERIC PO, Take 1 capsule by mouth daily., Disp: , Rfl:  .  vitamin B-12 (CYANOCOBALAMIN) 1000 MCG tablet, Take by mouth., Disp: , Rfl:  No current facility-administered medications for this visit.   Facility-Administered Medications Ordered in Other Visits:  .  regadenoson (LEXISCAN) injection SOLN 0.4 mg, 0.4 mg, Intravenous, Once, Turner, Traci R, MD      Objective:   Vitals:   01/15/19 1100  BP: 130/62  Pulse: 79  Temp: (!) 97 F (36.1 C)  TempSrc: Skin  SpO2: 98%  Weight: 164 lb 9.6 oz (74.7 kg)  Height: 5\' 7"  (1.702 m)    Estimated body mass index is 25.78 kg/m as calculated from the following:   Height as of this encounter: 5\' 7"  (1.702 m).   Weight as of this encounter: 164 lb 9.6 oz (74.7 kg).  @WEIGHTCHANGE @  [employer]   01/15/19 1100  Weight: 164 lb 9.6 oz (74.7 kg)     Physical Exam  General Appearance:    Alert, cooperative, no distress, appears stated age - younger lookin , Deconditioned  looking - no , OBESE  - no, Sitting on Wheelchair -  no  Head:    Normocephalic, without obvious abnormality, atraumatic  Eyes:    PERRL, conjunctiva/corneas clear,  Ears:    Normal TM's and external ear canals, both ears  Nose:   Nares normal, septum midline, mucosa normal, no drainage    or sinus tenderness. OXYGEN ON  - no . Patient is @ ra   Throat:   Lips, mucosa, and tongue normal; teeth and gums normal. Cyanosis on lips - no  Neck:   Supple, symmetrical, trachea midline, no adenopathy;    thyroid:  no enlargement/tenderness/nodules; no carotid   bruit or JVD  Back:     Symmetric, no curvature, ROM normal, no CVA tenderness  Lungs:     Distress - no , Wheeze no, Barrell Chest - no, Purse lip breathing - no, Crackles - no   Chest Wall:    No tenderness or deformity.    Heart:    Regular rate and rhythm, S1 and S2 normal, no rub   or gallop, Murmur - no  Breast Exam:    NOT DONE  Abdomen:     Soft, non-tender, bowel sounds active all four quadrants,    no masses, no organomegaly. Visceral obesity - no  Genitalia:   NOT DONE  Rectal:   NOT DONE  Extremities:   Extremities - normal, Has Cane - no, Clubbing - no, Edema - no  Pulses:   2+ and symmetric all extremities  Skin:   Stigmata of Connective Tissue Disease - no  Lymph nodes:   Cervical, supraclavicular, and axillary nodes normal  Psychiatric:  Neurologic:   Pleasant - yes, Anxious - no, Flat affect - no  CAm-ICU - neg, Alert and Oriented x 3 - yes, Moves all 4s - yes, Speech - normal, Cognition - intact           Assessment:       ICD-10-CM   1. Mild persistent asthma with exacerbation  J45.31        Plan:     Patient Instructions     ICD-10-CM   1. Mild persistent asthma with exacerbation  J45.31    ASthma well controlled Glad you had flu  shot  Plan '- continue breo daily  - use albuterol as needed  Followup  6-9 months or sooner if needed     SIGNATURE    Dr. Kalman Shan, M.D., F.C.C.P,   Pulmonary and Critical Care Medicine Staff Physician, [facility] Center Director - Interstitial Lung Disease  Program  Pulmonary Fibrosis [facility] at [facility] [place], [place], [address]  Pager: [phone], If no answer or between  15:00h - 7:00h: call [phone] Telephone: [phone]  11:37 AM 01/15/2019

## 2019-01-15 NOTE — Patient Instructions (Signed)
ICD-10-CM   1. Mild persistent asthma with exacerbation  J45.31    ASthma well controlled Glad you had flu shot  Plan '- continue breo daily  - use albuterol as needed  Followup  6-9 months or sooner if needed

## 2019-01-16 ENCOUNTER — Ambulatory Visit: Payer: Medicare Other | Admitting: Internal Medicine

## 2019-02-13 ENCOUNTER — Ambulatory Visit: Payer: Medicare Other | Admitting: Neurology

## 2019-03-09 ENCOUNTER — Encounter: Payer: Self-pay | Admitting: Neurology

## 2019-03-09 ENCOUNTER — Other Ambulatory Visit: Payer: Self-pay

## 2019-03-09 ENCOUNTER — Telehealth (INDEPENDENT_AMBULATORY_CARE_PROVIDER_SITE_OTHER): Payer: Medicare Other | Admitting: Neurology

## 2019-03-09 VITALS — Ht 67.0 in | Wt 158.0 lb

## 2019-03-09 DIAGNOSIS — G5603 Carpal tunnel syndrome, bilateral upper limbs: Secondary | ICD-10-CM | POA: Diagnosis not present

## 2019-03-09 DIAGNOSIS — M79675 Pain in left toe(s): Secondary | ICD-10-CM

## 2019-03-09 DIAGNOSIS — G8929 Other chronic pain: Secondary | ICD-10-CM | POA: Diagnosis not present

## 2019-03-09 DIAGNOSIS — R269 Unspecified abnormalities of gait and mobility: Secondary | ICD-10-CM

## 2019-03-09 DIAGNOSIS — R2689 Other abnormalities of gait and mobility: Secondary | ICD-10-CM

## 2019-03-09 NOTE — Progress Notes (Signed)
Virtual Visit via Telephone Note The purpose of this virtual visit is to provide medical care while limiting exposure to the novel coronavirus.    Consent was obtained for phone visit:  Yes.   Answered questions that patient had about telehealth interaction:  Yes.   I discussed the limitations, risks, security and privacy concerns of performing an evaluation and management service by telephone. I also discussed with the patient that there may be a patient responsible charge related to this service. The patient expressed understanding and agreed to proceed.  Pt location: Home Physician Location: office Name of referring provider:  Merlene Laughter, MD I connected with .Anne [name] at patients initiation/request on 03/09/2019 at  3:30 PM EST by telephone and verified that I am speaking with the correct person using two identifiers.  Pt MRN:  [id number] Pt DOB:  Sep 29, 1930   History of Present Illness: This is a 85 year-old female returning for follow-up for feet numbness.  She reports mild progression of neuropathy past the toes.  No tingling or pain.  She has many questions about nortriptyline and gabapentin which was previously tried.  She also complain of mild imbalance.  She continues to walk unassisted and has not suffered any falls.    Assessment and Plan:   1.  Bilateral feet numbness and imbalance, likely due to neuropathy.  Prior tested has included NCS from Jan 2019 was normal.  Patient had many questions about prognosis and management, including alternative therapies.  I address this at length and discouraged alternative therapies, as they are not FDA approved therapies.  I suspect that she will continue to function independently, even with mild neuropathy.  If her balance progresses, PT can be offered.    2. Left second toe pain, most likely due to RSD as it started following surgery.  Today, she denies pain and is considering discontinuing nortriptyline. She was informed that  discontinuing medication would be okay, but to monitor for worsening pain as her pain may very well be well-controlled on medication.  3. Bilateral carpal tunnel syndrome, mild.  Clinically, asymptomatic  - She did not tolerate wrist splints  Follow Up Instructions:    -I discussed the assessment and treatment plan with the patient. The patient was provided an opportunity to ask questions and all were answered. The patient agreed with the plan and demonstrated an understanding of the instructions.   The patient was advised to call back or seek an in-person evaluation if the symptoms worsen or if the condition fails to improve as anticipated.    Total Time spent in visit with the patient was:  24 min, of which 100% of the time was spent in counseling and/or coordinating care.   Pt understands and agrees with the plan of care outlined.     Glendale Chard, DO

## 2019-05-07 ENCOUNTER — Encounter: Payer: Self-pay | Admitting: Physical Therapy

## 2019-05-07 ENCOUNTER — Other Ambulatory Visit: Payer: Self-pay

## 2019-05-07 ENCOUNTER — Ambulatory Visit: Payer: Medicare PPO | Attending: Geriatric Medicine | Admitting: Physical Therapy

## 2019-05-07 ENCOUNTER — Ambulatory Visit: Payer: Medicare PPO | Admitting: Physical Therapy

## 2019-05-07 DIAGNOSIS — H8111 Benign paroxysmal vertigo, right ear: Secondary | ICD-10-CM | POA: Diagnosis not present

## 2019-05-07 DIAGNOSIS — R2681 Unsteadiness on feet: Secondary | ICD-10-CM | POA: Diagnosis present

## 2019-05-07 NOTE — Therapy (Signed)
Stable/Uncomplicated    Clinical Decision Making  Low    Rehab Potential  Good    PT Frequency  1x / week    PT Duration  4 weeks    PT Treatment/Interventions  ADLs/Self Care Home  Management;Vestibular;Therapeutic activities;Therapeutic exercise;Balance training;Neuromuscular re-education;Canalith Repostioning;Gait training    PT Next Visit Plan  recheck Rt Dix-Hallpike - give balance exercises prn    PT Home Exercise Plan  sit to Rt sidelying and info on Epley    Consulted and Agree with Plan of Care  Patient       Patient will benefit from skilled therapeutic intervention in order to improve the following deficits and impairments:  Dizziness, Decreased balance  Visit Diagnosis: BPPV (benign paroxysmal positional vertigo), right - Plan: PT plan of care cert/re-cert  Unsteadiness on feet - Plan: PT plan of care cert/re-cert     Problem List Patient Active Problem List   Diagnosis Date Noted  . Arthritis 04/08/2018  . Glaucoma 04/08/2018  . Herpes simplex type 2 infection 04/08/2018  . Uterine leiomyoma 04/08/2018  . Non-recurrent acute suppurative otitis media of left ear with spontaneous rupture of tympanic membrane 01/10/2018  . Pain 05/29/2017  . Throat pain in adult 05/28/2017  . Thrush of mouth and esophagus ([place]) 05/28/2017  . Acute bacterial sinusitis 10/10/2016  . De Quervain's syndrome (tenosynovitis) 01/25/2016  . Primary osteoarthritis of first carpometacarpal joint of right hand 01/25/2016  . Trigger finger of right thumb 01/25/2016  . Essential hypertension 12/19/2015  . Diastolic dysfunction [id number]  . Dysphagia 01/04/2015  . Chest tightness 11/16/2014  . Dyspnea 11/15/2014  . COPD GOLD I 11/02/2014  . GERD (gastroesophageal reflux disease) 11/02/2014  . Proptosis 05/31/2014  . Glaucoma, bilateral 05/31/2014  . Ptosis of both eyelids 05/31/2014  . Xanthelasma of left upper eyelid 05/31/2014  . Pain in limb 08/18/2012  . Routine gynecological examination 07/24/2011  . Environmental allergies 07/24/2011  . Osteoporosis 07/24/2011  . Nocturia 07/24/2011  . Asthma 07/04/2007  . PULMONARY COLLAPSE 07/04/2007  . PULMONARY NODULE  07/04/2007    DildayJenness Corner, PT 05/07/2019, 4:08 PM  [facility] [address] [place], [place], [address] Phone: [phone]   Fax:  [phone]  Name: CHALEE KILLEEN MRN: [id number] Date of Birth: 11-Dec-1930  [facility] [address] [place], [place], [address] Phone: [phone]   Fax:  [phone]  Physical Therapy Evaluation  Patient Details  Name: Anne [name] MRN: [id number] Date of Birth: [birth year] Referring Provider (PT): Dr. [name]   Encounter Date: 05/07/2019  PT End of Session - 05/07/19 1556    Visit Number  1    Number of Visits  4    Date for PT Re-Evaluation  06/04/19    Authorization Type  [employer]    PT Start Time  [id number]    PT Stop Time  1445    PT Time Calculation (min)  47 min    Activity Tolerance  Patient tolerated treatment well       Past Medical History:  Diagnosis Date  . Allergic rhinitis   . Arthritis   . Asthma   . Atypical chest pain   . Collapse of right lung   . Dyslipidemia   . Gastroesophageal reflux disease   . Glaucoma   . Hematuria   . Hyperlipidemia   . Lung nodules     Past Surgical History:  Procedure Laterality Date  . ANAL FISSURE REPAIR    . BREAST BIOPSY Right   . CATARACT EXTRACTION Bilateral   . MASS EXCISION  04/11/2012   Procedure: EXCISION MASS;  Surgeon: Wynonia Sours, MD;  Location: [facility];  Service: Orthopedics;  Laterality: Right;  Excision of mass right upper arm  . MEDIASTINOSCOPY  4/11  . NASAL SINUS SURGERY    . TOTAL ABDOMINAL HYSTERECTOMY W/ BILATERAL SALPINGOOPHORECTOMY      There were no vitals filed for this visit.   Subjective Assessment - 05/07/19 1356    Subjective  Pt states she had sudden onset of vertigo upon getting out of bed on 04-19-19; experienced nausea with it but no vomiting; felt bad until the following Thursday, 04-23-19: felt good enough to drive on Friday, [id number] to get her vaccine. States she did get Dramamine but did not take it alot because she didn't feel as if it was helping.  States her eyes felt very heavy yesterday but feels better today.    Pertinent History  asthma, glaucoma    Patient  Stated Goals  resolve the vertigo and improve balance    Currently in Pain?  No/denies         [facility] PT Assessment - 05/07/19 1402      Assessment   Medical Diagnosis  BPPV    Referring Provider (PT)  Dr. [name]    Onset Date/Surgical Date  04/19/19      Precautions   Precautions  None      Balance Screen   Has the patient fallen in the past 6 months  No    Has the patient had a decrease in activity level because of a fear of falling?   No    Is the patient reluctant to leave their home because of a fear of falling?   No      Prior Function   Level of Independence  Independent           Vestibular Assessment - 05/07/19 0001      Vestibular Assessment   General Observation  pt is an 86 yr old lady with c/o vertigo of sudden onset on morning of 04-19-19; pt reports she had nausea with the episode but no vomiting; states the vertigo has improved since onset but still occurs with  [facility] [address] [place], [place], [address] Phone: [phone]   Fax:  [phone]  Physical Therapy Evaluation  Patient Details  Name: Anne [name] MRN: [id number] Date of Birth: [birth year] Referring Provider (PT): Dr. [name]   Encounter Date: 05/07/2019  PT End of Session - 05/07/19 1556    Visit Number  1    Number of Visits  4    Date for PT Re-Evaluation  06/04/19    Authorization Type  [employer]    PT Start Time  [id number]    PT Stop Time  1445    PT Time Calculation (min)  47 min    Activity Tolerance  Patient tolerated treatment well       Past Medical History:  Diagnosis Date  . Allergic rhinitis   . Arthritis   . Asthma   . Atypical chest pain   . Collapse of right lung   . Dyslipidemia   . Gastroesophageal reflux disease   . Glaucoma   . Hematuria   . Hyperlipidemia   . Lung nodules     Past Surgical History:  Procedure Laterality Date  . ANAL FISSURE REPAIR    . BREAST BIOPSY Right   . CATARACT EXTRACTION Bilateral   . MASS EXCISION  04/11/2012   Procedure: EXCISION MASS;  Surgeon: Wynonia Sours, MD;  Location: [facility];  Service: Orthopedics;  Laterality: Right;  Excision of mass right upper arm  . MEDIASTINOSCOPY  4/11  . NASAL SINUS SURGERY    . TOTAL ABDOMINAL HYSTERECTOMY W/ BILATERAL SALPINGOOPHORECTOMY      There were no vitals filed for this visit.   Subjective Assessment - 05/07/19 1356    Subjective  Pt states she had sudden onset of vertigo upon getting out of bed on 04-19-19; experienced nausea with it but no vomiting; felt bad until the following Thursday, 04-23-19: felt good enough to drive on Friday, [id number] to get her vaccine. States she did get Dramamine but did not take it alot because she didn't feel as if it was helping.  States her eyes felt very heavy yesterday but feels better today.    Pertinent History  asthma, glaucoma    Patient  Stated Goals  resolve the vertigo and improve balance    Currently in Pain?  No/denies         [facility] PT Assessment - 05/07/19 1402      Assessment   Medical Diagnosis  BPPV    Referring Provider (PT)  Dr. [name]    Onset Date/Surgical Date  04/19/19      Precautions   Precautions  None      Balance Screen   Has the patient fallen in the past 6 months  No    Has the patient had a decrease in activity level because of a fear of falling?   No    Is the patient reluctant to leave their home because of a fear of falling?   No      Prior Function   Level of Independence  Independent           Vestibular Assessment - 05/07/19 0001      Vestibular Assessment   General Observation  pt is an 86 yr old lady with c/o vertigo of sudden onset on morning of 04-19-19; pt reports she had nausea with the episode but no vomiting; states the vertigo has improved since onset but still occurs with

## 2019-05-07 NOTE — Patient Instructions (Addendum)
Sit to Side-Lying    Sit on edge of bed. 1. Turn head 45 to right. 2. Maintain head position and lie down slowly on left side. Hold until symptoms subside. 3. Sit up slowly. Hold until symptoms subside. 4. Turn head 45 to left. 5. Maintain head position and lie down slowly on right side. Hold until symptoms subside. 6. Sit up slowly. Repeat sequence _5___ times per session. Do _3__ sessions per day. How to Perform the Epley Maneuver The Epley maneuver is an exercise that relieves symptoms of vertigo. Vertigo is the feeling that you or your surroundings are moving when they are not. When you feel vertigo, you may feel like the room is spinning and have trouble walking. Dizziness is a little different than vertigo. When you are dizzy, you may feel unsteady or light-headed. You can do this maneuver at home whenever you have symptoms of vertigo. You can do it up to 3 times a day until your symptoms go away. Even though the Epley maneuver may relieve your vertigo for a few weeks, it is possible that your symptoms will return. This maneuver relieves vertigo, but it does not relieve dizziness. What are the risks? If it is done correctly, the Epley maneuver is considered safe. Sometimes it can lead to dizziness or nausea that goes away after a short time. If you develop other symptoms, such as changes in vision, weakness, or numbness, stop doing the maneuver and call your health care provider. How to perform the Epley maneuver 1. Sit on the edge of a bed or table with your back straight and your legs extended or hanging over the edge of the bed or table. 2. Turn your head halfway toward the affected ear or side. 3. Lie backward quickly with your head turned until you are lying flat on your back. You may want to position a pillow under your shoulders. 4. Hold this position for 30 seconds. You may experience an attack of vertigo. This is normal. 5. Turn your head to the opposite direction until your  unaffected ear is facing the floor. 6. Hold this position for 30 seconds. You may experience an attack of vertigo. This is normal. Hold this position until the vertigo stops. 7. Turn your whole body to the same side as your head. Hold for another 30 seconds. 8. Sit back up. You can repeat this exercise up to 3 times a day. Follow these instructions at home:  After doing the Epley maneuver, you can return to your normal activities.  Ask your health care provider if there is anything you should do at home to prevent vertigo. He or she may recommend that you: ? Keep your head raised (elevated) with two or more pillows while you sleep. ? Do not sleep on the side of your affected ear. ? Get up slowly from bed. ? Avoid sudden movements during the day. ? Avoid extreme head movement, like looking up or bending over. Contact a health care provider if:  Your vertigo gets worse.  You have other symptoms, including: ? Nausea. ? Vomiting. ? Headache. Get help right away if:  You have vision changes.  You have a severe or worsening headache or neck pain.  You cannot stop vomiting.  You have new numbness or weakness in any part of your body. Summary  Vertigo is the feeling that you or your surroundings are moving when they are not.  The Epley maneuver is an exercise that relieves symptoms of vertigo.  If the  Epley maneuver is done correctly, it is considered safe. You can do it up to 3 times a day. This information is not intended to replace advice given to you by your health care provider. Make sure you discuss any questions you have with your health care provider. Document Revised: 03/01/2017 Document Reviewed: 02/07/2016 Elsevier Patient Education  [place] for Right Posterior / Anterior Canalithiasis    Sitting on bed: 1. Turn head 45 right. (a) Lie back slowly, shoulders on pillow, head on bed. (b) Hold 20-30___ seconds. 2. Keeping head on bed, turn  head 90 left. Hold _20-30___ seconds. 3. Roll to left, head on 45 angle down toward bed. Hold _20-30___ seconds. 4. Sit up on left side of bed. Repeat __2-3__ times per session. Do __1-2__ sessions per day.  Copyright  VHI. All rights reserved.

## 2019-05-11 ENCOUNTER — Ambulatory Visit: Payer: Medicare PPO | Admitting: Physical Therapy

## 2019-05-25 ENCOUNTER — Other Ambulatory Visit: Payer: Self-pay | Admitting: Geriatric Medicine

## 2019-05-25 DIAGNOSIS — G4452 New daily persistent headache (NDPH): Secondary | ICD-10-CM

## 2019-05-31 ENCOUNTER — Other Ambulatory Visit: Payer: Self-pay

## 2019-05-31 ENCOUNTER — Emergency Department (HOSPITAL_COMMUNITY)
Admission: EM | Admit: 2019-05-31 | Discharge: 2019-05-31 | Disposition: A | Discharge status: Home / Self Care | Payer: Medicare PPO | Attending: Emergency Medicine | Admitting: Emergency Medicine

## 2019-05-31 ENCOUNTER — Emergency Department (HOSPITAL_COMMUNITY): Payer: Medicare PPO

## 2019-05-31 DIAGNOSIS — I10 Essential (primary) hypertension: Secondary | ICD-10-CM | POA: Diagnosis not present

## 2019-05-31 DIAGNOSIS — Z79899 Other long term (current) drug therapy: Secondary | ICD-10-CM | POA: Diagnosis not present

## 2019-05-31 DIAGNOSIS — Z91040 Latex allergy status: Secondary | ICD-10-CM | POA: Diagnosis not present

## 2019-05-31 DIAGNOSIS — R42 Dizziness and giddiness: Secondary | ICD-10-CM | POA: Diagnosis not present

## 2019-05-31 DIAGNOSIS — J449 Chronic obstructive pulmonary disease, unspecified: Secondary | ICD-10-CM | POA: Insufficient documentation

## 2019-05-31 DIAGNOSIS — J45909 Unspecified asthma, uncomplicated: Secondary | ICD-10-CM | POA: Diagnosis not present

## 2019-05-31 DIAGNOSIS — R519 Headache, unspecified: Secondary | ICD-10-CM | POA: Diagnosis present

## 2019-05-31 DIAGNOSIS — G8929 Other chronic pain: Secondary | ICD-10-CM

## 2019-05-31 LAB — CBC WITH DIFFERENTIAL/PLATELET
Abs Immature Granulocytes: 0.02 10*3/uL (ref 0.00–0.07)
Basophils Absolute: 0 10*3/uL (ref 0.0–0.1)
Basophils Relative: 1 %
Eosinophils Absolute: 0.2 10*3/uL (ref 0.0–0.5)
Eosinophils Relative: 4 %
HCT: 35.9 % — ABNORMAL LOW (ref 36.0–46.0)
Hemoglobin: 11.5 g/dL — ABNORMAL LOW (ref 12.0–15.0)
Immature Granulocytes: 0 %
Lymphocytes Relative: 41 %
Lymphs Abs: 2.3 10*3/uL (ref 0.7–4.0)
MCH: 30.4 pg (ref 26.0–34.0)
MCHC: 32 g/dL (ref 30.0–36.0)
MCV: 95 fL (ref 80.0–100.0)
Monocytes Absolute: 0.5 10*3/uL (ref 0.1–1.0)
Monocytes Relative: 9 %
Neutro Abs: 2.6 10*3/uL (ref 1.7–7.7)
Neutrophils Relative %: 45 %
Platelets: 286 10*3/uL (ref 150–400)
RBC: 3.78 MIL/uL — ABNORMAL LOW (ref 3.87–5.11)
RDW: 13.5 % (ref 11.5–15.5)
WBC: 5.7 10*3/uL (ref 4.0–10.5)
nRBC: 0 % (ref 0.0–0.2)

## 2019-05-31 LAB — BASIC METABOLIC PANEL
Anion gap: 7 (ref 5–15)
BUN: 25 mg/dL — ABNORMAL HIGH (ref 8–23)
CO2: 27 mmol/L (ref 22–32)
Calcium: 9.1 mg/dL (ref 8.9–10.3)
Chloride: 106 mmol/L (ref 98–111)
Creatinine, Ser: 1.07 mg/dL — ABNORMAL HIGH (ref 0.44–1.00)
GFR calc Af Amer: 54 mL/min — ABNORMAL LOW (ref >60)
GFR calc non Af Amer: 47 mL/min — ABNORMAL LOW (ref >60)
Glucose, Bld: 114 mg/dL — ABNORMAL HIGH (ref 70–99)
Potassium: 4.6 mmol/L (ref 3.5–5.1)
Sodium: 140 mmol/L (ref 135–145)

## 2019-05-31 LAB — TROPONIN I (HIGH SENSITIVITY): Troponin I (High Sensitivity): 4 ng/L (ref <18)

## 2019-05-31 MED ORDER — HYDRALAZINE HCL 20 MG/ML IJ SOLN
10.0000 mg | Freq: Once | INTRAMUSCULAR | Status: AC
  Administered 2019-05-31: 22:00:00 10 mg via INTRAVENOUS
  Filled 2019-05-31: qty 1

## 2019-05-31 MED ORDER — AMLODIPINE BESYLATE 5 MG PO TABS: 5.0000 mg | ORAL_TABLET | Freq: Every day | ORAL | 0 refills | Status: DC

## 2019-05-31 MED ORDER — SODIUM CHLORIDE 0.9 % IV BOLUS
500.0000 mL | Freq: Once | INTRAVENOUS | Status: AC
  Administered 2019-05-31: 500 mL via INTRAVENOUS

## 2019-05-31 MED ORDER — METOCLOPRAMIDE HCL 5 MG/ML IJ SOLN
10.0000 mg | Freq: Once | INTRAMUSCULAR | Status: AC
  Administered 2019-05-31: 22:00:00 10 mg via INTRAVENOUS
  Filled 2019-05-31: qty 2

## 2019-05-31 MED ORDER — DIPHENHYDRAMINE HCL 50 MG/ML IJ SOLN
12.5000 mg | Freq: Once | INTRAMUSCULAR | Status: AC
  Administered 2019-05-31: 12.5 mg via INTRAVENOUS
  Filled 2019-05-31: qty 1

## 2019-05-31 NOTE — ED Provider Notes (Signed)
[facility] DEPT Provider Note   CSN: [id number] Arrival date & time: 05/31/19  2033     History Chief Complaint  Patient presents with  . Headache    Anne [name] is a 86 y.o. female presented emergency department with vertigo and headache.  The patient reports that she had sudden onset of vertigo approximately 1 month ago in January, when turning over in her bed.  She is never had vertigo prior to that.  She went to see her doctor put her on meclizine, she also saw physical therapist.  She reports intensity of her vertigo is since improved continues to have episodes with head movement of vertigo.  She said 2 weeks ago she began having headaches.  These are sharp pains are located all throughout her head.  Her PCP has her scheduled for an MRI this coming Wednesday in 3 days.  However she came to the ED because she feels her headache is intensifying.  She now feels it concentrated in the back of her head.  She has taken Tylenol at home and received little relief.  She denies any numbness or weakness.  She reports her gait has been off for a while, thinks is related to vertigo.  She denies any chest pain or shortness of breath.  She denies any history of stroke or TIA.  She denies any smoking history.  She denies any vision changes.  HPI     Past Medical History:  Diagnosis Date  . Allergic rhinitis   . Arthritis   . Asthma   . Atypical chest pain   . Collapse of right lung   . Dyslipidemia   . Gastroesophageal reflux disease   . Glaucoma   . Hematuria   . Hyperlipidemia   . Lung nodules     Patient Active Problem List   Diagnosis Date Noted  . Arthritis 04/08/2018  . Glaucoma 04/08/2018  . Herpes simplex type 2 infection 04/08/2018  . Uterine leiomyoma 04/08/2018  . Non-recurrent acute suppurative otitis media of left ear with spontaneous rupture of tympanic membrane 01/10/2018  . Pain 05/29/2017  . Throat pain in adult 05/28/2017  .  Thrush of mouth and esophagus ([place]) 05/28/2017  . Acute bacterial sinusitis 10/10/2016  . De Quervain's syndrome (tenosynovitis) 01/25/2016  . Primary osteoarthritis of first carpometacarpal joint of right hand 01/25/2016  . Trigger finger of right thumb 01/25/2016  . Essential hypertension 12/19/2015  . Diastolic dysfunction [id number]  . Dysphagia 01/04/2015  . Chest tightness 11/16/2014  . Dyspnea 11/15/2014  . COPD GOLD I 11/02/2014  . GERD (gastroesophageal reflux disease) 11/02/2014  . Proptosis 05/31/2014  . Glaucoma, bilateral 05/31/2014  . Ptosis of both eyelids 05/31/2014  . Xanthelasma of left upper eyelid 05/31/2014  . Pain in limb 08/18/2012  . Routine gynecological examination 07/24/2011  . Environmental allergies 07/24/2011  . Osteoporosis 07/24/2011  . Nocturia 07/24/2011  . Asthma 07/04/2007  . PULMONARY COLLAPSE 07/04/2007  . PULMONARY NODULE 07/04/2007    Past Surgical History:  Procedure Laterality Date  . ANAL FISSURE REPAIR    . BREAST BIOPSY Right   . CATARACT EXTRACTION Bilateral   . MASS EXCISION  04/11/2012   Procedure: EXCISION MASS;  Surgeon: Wynonia Sours, MD;  Location: [facility];  Service: Orthopedics;  Laterality: Right;  Excision of mass right upper arm  . MEDIASTINOSCOPY  4/11  . NASAL SINUS SURGERY    . TOTAL ABDOMINAL HYSTERECTOMY W/ BILATERAL SALPINGOOPHORECTOMY  OB History   No obstetric history on file.     Family History  Problem Relation Age of Onset  . Cancer Mother   . Heart disease Father   . Breast cancer Cousin   . Heart attack Neg Hx     Social History   Tobacco Use  . Smoking status: Never Smoker  . Smokeless tobacco: Never Used  Substance Use Topics  . Alcohol use: No    Alcohol/week: 0.0 standard drinks  . Drug use: No    Home Medications Prior to Admission medications   Medication Sig Start Date End Date Taking? Authorizing Provider  albuterol (PROVENTIL) (2.5 MG/3ML) 0.083%  nebulizer solution Take 3 mLs (2.5 mg total) by nebulization every 6 (six) hours as needed for wheezing or shortness of breath. 12/31/16   Magdalen Spatz, NP  amLODipine (NORVASC) 5 MG tablet Take 1 tablet (5 mg total) by mouth daily for 90 doses. 05/31/19 08/29/19  Wyvonnia Dusky, MD  Calcium Carb-Cholecalciferol (CALCIUM 600 + D) 600-200 MG-UNIT TABS Take 2 tablets by mouth daily.     [provider]  Calcium Carbonate-Vitamin D (CALCIUM HIGH POTENCY/VITAMIN D) 600-200 MG-UNIT TABS Take by mouth.    [provider]  cetirizine (ZYRTEC) 10 MG tablet Take 10 mg by mouth every morning.    [provider]  ferrous sulfate 325 (65 FE) MG tablet Take 325 mg by mouth daily with breakfast. Three times a week    [provider]  fluticasone (FLONASE) 50 MCG/ACT nasal spray Place 1 spray into both nostrils 2 (two) times daily as needed for allergies.     [provider]  latanoprost (XALATAN) 0.005 % ophthalmic solution Place 1 drop into both eyes at bedtime.    [provider]  meloxicam (MOBIC) 7.5 MG tablet Take 7.5 mg by mouth daily.    [provider]  Multiple Vitamin (MULTIVITAMIN) tablet Take 1 tablet by mouth every morning.     [provider]  nortriptyline (PAMELOR) 10 MG capsule Take 2 tablets at bedtime 01/12/19   Patel, Donika K, DO  PROAIR HFA 108 (90 Base) MCG/ACT inhaler INHALE 1-2 PUFFS INTO THE LUNGS EVERY 6 (SIX) HOURS AS NEEDED FOR WHEEZING OR SHORTNESS OF BREATH. 03/17/18   Brand Males, MD  TURMERIC PO Take 1 capsule by mouth daily.    [provider]  vitamin B-12 (CYANOCOBALAMIN) 1000 MCG tablet Take by mouth.    [provider]    Allergies    Other, Shellfish allergy, Iodine, Latex, and Adhesive [tape]  Review of Systems   Review of Systems  Constitutional: Negative for chills and fever.  Eyes: Positive for visual disturbance. Negative for photophobia.  Respiratory: Negative for  cough and shortness of breath.   Cardiovascular: Negative for chest pain and palpitations.  Gastrointestinal: Negative for abdominal pain and vomiting.  Musculoskeletal: Negative for neck pain and neck stiffness.  Neurological: Positive for dizziness, light-headedness and headaches. Negative for seizures, syncope, facial asymmetry, speech difficulty and numbness.  Psychiatric/Behavioral: Negative for agitation and confusion.  All other systems reviewed and are negative.   Physical Exam Updated Vital Signs BP (!) 175/60   Pulse (!) 101   Temp 98.3 F (36.8 C) (Oral)   Resp 20   SpO2 100%   Physical Exam Vitals and nursing note reviewed.  Constitutional:      General: She is not in acute distress.    Appearance: She is well-developed.  HENT:     Head:  Normocephalic and atraumatic.  Eyes:     Conjunctiva/sclera: Conjunctivae normal.  Cardiovascular:     Rate and Rhythm: Normal rate and regular rhythm.  Pulmonary:     Effort: Pulmonary effort is normal. No respiratory distress.     Breath sounds: Normal breath sounds.  Abdominal:     Palpations: Abdomen is soft.     Tenderness: There is no abdominal tenderness.  Musculoskeletal:     Cervical back: Neck supple.  Skin:    General: Skin is warm and dry.  Neurological:     Mental Status: She is alert.     GCS: GCS eye subscore is 4. GCS verbal subscore is 5. GCS motor subscore is 6.     Cranial Nerves: Cranial nerves are intact. No cranial nerve deficit, dysarthria or facial asymmetry.     Sensory: Sensation is intact.     Motor: Motor function is intact.     Coordination: Coordination is intact. Finger-Nose-Finger Test normal.     Gait: Gait is intact. Gait normal.  Psychiatric:        Mood and Affect: Mood normal.        Behavior: Behavior normal.     ED Results / Procedures / Treatments   Labs (all labs ordered are listed, but only abnormal results are displayed) Labs Reviewed  BASIC METABOLIC PANEL - Abnormal;  Notable for the following components:      Result Value   Glucose, Bld 114 (*)    BUN 25 (*)    Creatinine, Ser 1.07 (*)    GFR calc non Af Amer 47 (*)    GFR calc Af Amer 54 (*)    All other components within normal limits  CBC WITH DIFFERENTIAL/PLATELET - Abnormal; Notable for the following components:   RBC 3.78 (*)    Hemoglobin 11.5 (*)    HCT 35.9 (*)    All other components within normal limits  TROPONIN I (HIGH SENSITIVITY)    EKG EKG Interpretation  Date/Time:  Sunday May 31 2019 21:53:05 EST Ventricular Rate:  80 PR Interval:    QRS Duration: 82 QT Interval:  361 QTC Calculation: 417 R Axis:   68 Text Interpretation: Sinus or ectopic atrial rhythm Anteroseptal infarct, age indeterminate No STEMI Confirmed by Octaviano Glow [phone]) on 05/31/2019 10:28:57 PM   Radiology CT Head Wo Contrast  Result Date: 05/31/2019 CLINICAL DATA:  Headache EXAM: CT HEAD WITHOUT CONTRAST TECHNIQUE: Contiguous axial images were obtained from the base of the skull through the vertex without intravenous contrast. COMPARISON:  November 03, 2007 FINDINGS: Brain: No evidence of acute territorial infarction, hemorrhage, hydrocephalus,extra-axial collection or mass lesion/mass effect. There is mild dilatation the ventricles and sulci consistent with age-related atrophy. Low-attenuation changes in the deep white matter consistent with small vessel ischemia. Vascular: No hyperdense vessel or unexpected calcification. Skull: The skull is intact. No fracture or focal lesion identified. Sinuses/Orbits: Mild mucosal thickening seen within the maxillary sinuses and ethmoid air cells. The orbits and globes intact. Other: None IMPRESSION: No acute intracranial abnormality. Findings consistent with mild age related atrophy and chronic small vessel ischemia Mild sinusitis of the maxillary and ethmoid air cells Electronically Signed   By: Prudencio Pair M.D.   On: 05/31/2019 22:22    Procedures Procedures  (including critical care time)  Medications Ordered in ED Medications  hydrALAZINE (APRESOLINE) injection 10 mg (10 mg Intravenous Given 05/31/19 2226)  metoCLOPramide (REGLAN) injection 10 mg (10 mg Intravenous Given 05/31/19 2229)  sodium chloride 0.9 %  bolus 500 mL (0 mLs Intravenous Stopped 05/31/19 2308)  diphenhydrAMINE (BENADRYL) injection 12.5 mg (12.5 mg Intravenous Given 05/31/19 2229)    ED Course  I have reviewed the triage vital signs and the nursing notes.  Pertinent labs & imaging results that were available during my care of the patient were reviewed by me and considered in my medical decision making (see chart for details).  86 yo female presenting to ED with headaches and HTN, ongoing vertigo for several weeks.  No confusion issues or falls.  She has a benign neurological exam here.  She is hypertensive, and tells me her blood pressure is usually 0000000 mm hg systolic.  I felt it was reasonable to perform a hypertensive urgency evaluation.  Her ecg and troponin were reassuring (no chest pain or cardiac symptoms).  Her Cr was at baseline.  Her CTH was benign, as was her neurological exam.  I was able to lower her blood pressure with IV medications for migraine and BP, and she was feeling better afterwards.  I told her I'd start her on amlodipine 5mg  (can titrate to 10 mg if needed) and have her f/u for her MRI in 3 days.  She was in agreement with this plan.  Clinical Course as of May 31 105  Sun May 31, 2019  2229 Other: None  IMPRESSION: No acute intracranial abnormality.  Findings consistent with mild age related atrophy and chronic small vessel ischemia  Mild sinusitis of the maxillary and ethmoid air cells   [MT]  2315 Headache improved with lowering of BP.  I will start her on amlodipine 5 mg, advised monitoring BP at home and she can increase to 10 mg if needed, and to f/u for her MRI on wednesday   [MT]    Clinical Course User Index [MT] Wyvonnia Dusky,  MD    Final Clinical Impression(s) / ED Diagnoses Final diagnoses:  Hypertension, unspecified type  Chronic nonintractable headache, unspecified headache type    Rx / DC Orders ED Discharge Orders         Ordered    amLODipine (NORVASC) 5 MG tablet  Daily     05/31/19 2325           Wyvonnia Dusky, MD 06/01/19 [phone]

## 2019-05-31 NOTE — ED Triage Notes (Signed)
Pt arrived via [facility] from home CC headache X 2 weeks. Pt visited PCP last week and has MRI scheduled this Wednesday. Pt reports taking 1000 mg tylenol today with no relief. Pt rep[orts "getting the Covid vaccine and that's when her HA started".  Per EMA A/OX4, ambulatory  Hx Vertigo, asthma

## 2019-05-31 NOTE — ED Notes (Signed)
Patient ambulated to restroom unassisted.  Gait steady.

## 2019-05-31 NOTE — Discharge Instructions (Addendum)
Your bloodwork was reassuring, and your CT scan of the brain did not show signs of a tumor or stroke or brain bleed.  I suspect your headache may be related to your high blood pressure.  I started you on a medicine called amlodipine (norvasc) at 5 mg per day.    Check your blood pressure twice a day.  If your systolic blood pressure (top number) is over 170 mm hg, or your diastolic blood pressure (bottom number) is over 100 mmhg, you should double your dose of norvasc to 10 mg daily.  You can also take benadryl 25 mg at night before bedtime if you are having a headache when you sleep.  It's very important that you get your MRI done on Wednesday as scheduled.  Call your primary care doctor's office tomorrow to check in with them about your ER visit today.   Your primary care provider should be managing your blood pressure chronically.

## 2019-06-03 ENCOUNTER — Other Ambulatory Visit: Payer: Self-pay

## 2019-06-03 ENCOUNTER — Ambulatory Visit
Admission: RE | Admit: 2019-06-03 | Discharge: 2019-06-03 | Disposition: A | Discharge status: Home / Self Care | Payer: Medicare PPO | Source: Ambulatory Visit | Attending: Geriatric Medicine | Admitting: Geriatric Medicine

## 2019-06-03 DIAGNOSIS — I6789 Other cerebrovascular disease: Secondary | ICD-10-CM | POA: Diagnosis not present

## 2019-06-03 DIAGNOSIS — G4452 New daily persistent headache (NDPH): Secondary | ICD-10-CM

## 2019-06-03 MED ORDER — GADOBENATE DIMEGLUMINE 529 MG/ML IV SOLN
15.0000 mL | Freq: Once | INTRAVENOUS | Status: AC | PRN
  Administered 2019-06-03: 15:00:00 14 mL via INTRAVENOUS

## 2019-06-12 DIAGNOSIS — R519 Headache, unspecified: Secondary | ICD-10-CM | POA: Diagnosis not present

## 2019-06-12 DIAGNOSIS — H8120 Vestibular neuronitis, unspecified ear: Secondary | ICD-10-CM | POA: Diagnosis not present

## 2019-06-19 DIAGNOSIS — R351 Nocturia: Secondary | ICD-10-CM | POA: Diagnosis not present

## 2019-06-19 DIAGNOSIS — N3281 Overactive bladder: Secondary | ICD-10-CM | POA: Diagnosis not present

## 2019-06-19 DIAGNOSIS — R3129 Other microscopic hematuria: Secondary | ICD-10-CM | POA: Diagnosis not present

## 2019-06-23 ENCOUNTER — Telehealth: Payer: Self-pay | Admitting: Neurology

## 2019-06-23 NOTE — Telephone Encounter (Signed)
Patient was referred to see Dr. Posey Pronto, who she is already established with, for headaches. When the patient returned our call for scheduling, she said she is not having problems with headaches anymore.  Instead, patient wants to see Dr. Posey Pronto for neuropathy and foot pain. She said she has seen Dr. Posey Pronto before for this. However, there are not any available slots to offer with Dr. Posey Pronto.  Sending to clinical staff to advise patient.

## 2019-06-24 NOTE — Telephone Encounter (Signed)
Called patient and left a message to call back re: VV at 3 PM today with Dr. Posey Pronto.

## 2019-06-24 NOTE — Telephone Encounter (Signed)
OK to offer VV today at 3pm.

## 2019-07-02 ENCOUNTER — Telehealth: Payer: Self-pay | Admitting: Internal Medicine

## 2019-07-02 MED ORDER — BREO ELLIPTA 100-25 MCG/INH IN AEPB: 1.0000 | INHALATION_SPRAY | Freq: Every day | RESPIRATORY_TRACT | 1 refills | Status: DC

## 2019-07-02 NOTE — Telephone Encounter (Signed)
Spoke with pt. She is needing a refill on Breo. Rx has been sent in to last until her ROV in May. Nothing further needed.

## 2019-07-14 DIAGNOSIS — Z049 Encounter for examination and observation for unspecified reason: Secondary | ICD-10-CM | POA: Diagnosis not present

## 2019-07-14 DIAGNOSIS — Z79899 Other long term (current) drug therapy: Secondary | ICD-10-CM | POA: Diagnosis not present

## 2019-07-14 DIAGNOSIS — R42 Dizziness and giddiness: Secondary | ICD-10-CM | POA: Diagnosis not present

## 2019-07-14 DIAGNOSIS — G4452 New daily persistent headache (NDPH): Secondary | ICD-10-CM | POA: Diagnosis not present

## 2019-07-14 DIAGNOSIS — R519 Headache, unspecified: Secondary | ICD-10-CM | POA: Diagnosis not present

## 2019-07-27 DIAGNOSIS — R42 Dizziness and giddiness: Secondary | ICD-10-CM | POA: Diagnosis not present

## 2019-07-27 DIAGNOSIS — G4452 New daily persistent headache (NDPH): Secondary | ICD-10-CM | POA: Diagnosis not present

## 2019-07-28 DIAGNOSIS — M7122 Synovial cyst of popliteal space [Baker], left knee: Secondary | ICD-10-CM | POA: Diagnosis not present

## 2019-07-30 DIAGNOSIS — H8111 Benign paroxysmal vertigo, right ear: Secondary | ICD-10-CM | POA: Diagnosis not present

## 2019-08-04 DIAGNOSIS — R3121 Asymptomatic microscopic hematuria: Secondary | ICD-10-CM | POA: Diagnosis not present

## 2019-08-11 DIAGNOSIS — H401121 Primary open-angle glaucoma, left eye, mild stage: Secondary | ICD-10-CM | POA: Diagnosis not present

## 2019-08-11 DIAGNOSIS — H04123 Dry eye syndrome of bilateral lacrimal glands: Secondary | ICD-10-CM | POA: Diagnosis not present

## 2019-08-11 DIAGNOSIS — Z961 Presence of intraocular lens: Secondary | ICD-10-CM | POA: Diagnosis not present

## 2019-08-11 DIAGNOSIS — H401112 Primary open-angle glaucoma, right eye, moderate stage: Secondary | ICD-10-CM | POA: Diagnosis not present

## 2019-08-17 ENCOUNTER — Ambulatory Visit: Payer: Medicare PPO | Admitting: Internal Medicine

## 2019-08-17 ENCOUNTER — Ambulatory Visit (INDEPENDENT_AMBULATORY_CARE_PROVIDER_SITE_OTHER): Payer: Medicare PPO

## 2019-08-17 ENCOUNTER — Other Ambulatory Visit: Payer: Self-pay

## 2019-08-17 ENCOUNTER — Encounter: Payer: Self-pay | Admitting: Internal Medicine

## 2019-08-17 VITALS — BP 122/58 | HR 72 | Temp 97.4°F | Ht 67.0 in | Wt 163.8 lb

## 2019-08-17 DIAGNOSIS — J4530 Mild persistent asthma, uncomplicated: Secondary | ICD-10-CM | POA: Diagnosis not present

## 2019-08-17 DIAGNOSIS — J45909 Unspecified asthma, uncomplicated: Secondary | ICD-10-CM | POA: Diagnosis not present

## 2019-08-17 NOTE — Patient Instructions (Signed)
ICD-10-CM   1. Mild persistent asthma without complication  [id number]     Stable and well controlled  Plan  - cotninue breo daily - next visit we can discuss downsizing to steroid only inhaler  - continue albuterol as needed  - do CXR 2 view  - glad you are uptodate with your vaccines  Followup  6-9 months or sooner if needed

## 2019-08-17 NOTE — Addendum Note (Signed)
Addended by: Lorretta Harp on: 08/17/2019 11:24 AM   Modules accepted: Orders

## 2019-08-17 NOTE — Progress Notes (Signed)
HPI Chief Complaint  Patient presents with  . Acute Visit    cough     Referring provider: [name], MD  HPI: 86 year old female never smoker followed for mild persistent asthma History of diastolic dysfunction Known chronic right middle lobe collapse/ lymphadenopathy -s/p 07/2007 FOB +psuedomonas  Test Exhaled nitric oxide test May 2018: 17 ppb High resolution CT chest August 2016 showed no evidence of interstitial lung disease, chronic collapse of right middle lobe MEdiastinaoscopy 07/27/2009 - Benign node of 4R and 4L. ANthracotoic pigment +. No granuoma. Non malignant PFT 10/28/2014 FEV1 94, ratio 54, FVC 135, no significant bronchodilator response, mid flows reversibility, DLCO 65%   04/25/2017 Acute OV : Asthma  Patient presents for an acute office visit.  She complains of 3 weeks of cough ,congestion sinus congestion, drainage , wheezing . Seen PCP dx w/ bronchitis , rx  , Doxycycline and Prednisone . Feels some better but still has chest congestion and tightness .  She remains on Breo daily.    Appetite is okay w/ no nv/d.  No chest pain, orthopnea or ede   OV 06/05/2017  Chief Complaint  Patient presents with  . Follow-up    Pt states she has been doing good since last visit. Pt states she does become SOB with exertion. Denies any cough or CP.    Fu Mild persiostent asthma with associated diastolic dysfunction  Contines on broe. Overall stable. But last  4 month has has had 2 surgeries for hammer toe on left food. So less active. Correlating with this more dyspnea on exertion than usual. Also, found to have mold in the bathroom and professionally removed . Has not affected her but she is asking for feno and walk test both of which aare normal. She wants sample breo       OV 12/17/2017  Subjective:  Patient ID: Anne [name], female , DOB: 03/11/1931 , age 84 y.o. , MRN: [id number] , ADDRESS: [place] [address]   12/17/2017 -    Chief Complaint  Patient presents with  . Follow-up    Pt states she has been doing well since last visit and denies any complaints.   Fu Mild persiostent asthma with associated diastolic dysfunction  HPI Anne [name] 86 y.o. -  Mild persistent asthma follow-up. Recently she was switched to Brio on Qvar. This is helped his symptoms. In addition she is in the mold in the house.She feels great. She is somewhat she has done well. No interim asthma exacerbations. She will have a high dose flu shot today. There are really no other issues other than some mild recurrence of costochondral chest pain in the last few days is a recurrent problem for her.     Results for HARVEY, KAMER (MRN [id number]) as of 06/05/2017 14:14  Ref. Range 08/07/2016 12:01 02/12/2017 11:30 06/05/2017 13:04  Nitric Oxide Unknown 17 18 20    OV 01/15/2019  Subjective:  Patient ID: Anne [name], female , DOB: [birth year] , age 74 y.o. , MRN: [id number] , ADDRESS: [place] [address]   01/15/2019 -   Chief Complaint  Patient presents with  . Follow-up    Pt states her breathing is doing well. Pt denies any increase in SOB, cough, CP/tightness, f/c/s, body aches.    86 year old female never smoker followed for mild persistent asthma History of diastolic dysfunction Known chronic right middle lobe collapse/ lymphadenopathy -s/p 07/2007 FOB +psuedomonas - MEdiastinaoscopy 07/27/2009 -  Benign node of 4R and 4L. ANthracotoic pigment +. No granuoma. Non malignant   HPI Anne [name] 86 y.o. -presents for follow-up.  Last seen September 2019 by myself.  In the interim visit with the nurse practitioner.  She says the Banning works well for her.  She says asthma is really well controlled.  No new symptoms.  She is up-to-date with the flu shot.  She is extremely active.  She is social distancing and isolating quite well and only doing low risk COVID-19 activities.  There are no new complaints or issues.  OV  08/17/2019  Subjective:  Patient ID: Anne [name], female , DOB: 02/19/1931 , age 70 y.o. , MRN: [id number] , ADDRESS: [address] [place] [address]   08/17/2019 -   Chief Complaint  Patient presents with  . Follow-up    Pt states she has been doing well since last visit and denies any complaints.   Female never smoker followed for mild persistent asthma  History of diastolic dysfunction  Known chronic right middle lobe collapse/ lymphadenopathy -s/p 07/2007 FOB +psuedomonas - MEdiastinaoscopy 07/27/2009 - Benign node of 4R and 4L. ANthracotoic pigment +. No granuoma. Non malignant  - last cxr 2019 and clear  HPI Anne [name] 86 y.o. -presents for follow-up.  Since last visit she has been doing well.  She has not had a Covid vaccine.  She continues social distancing.  She continues Breo.  She does not want to downsize the inhaler because of Covid.  She feels she needs extra protection.  She wants to maintain her health.  She is glad she has done well so far overall.  Her last chest x-ray was in 2019 she is willing to have another chest x-ray.  In terms of asthma asthma control question is 0 out of 5.  She not waking up in the middle of the night when she wakes up she has no problems she not having any activity limitation because of asthma.  She had wheezing because of asthma she not having any shortness of breath because of asthma she not using albuterol for rescue.     ROS - per HPI     has a past medical history of Allergic rhinitis, Arthritis, Asthma, Atypical chest pain, Collapse of right lung, Dyslipidemia, Gastroesophageal reflux disease, Glaucoma, Hematuria, Hyperlipidemia, and Lung nodules.   reports that she has never smoked. She has never used smokeless tobacco.  Past Surgical History:  Procedure Laterality Date  . ANAL FISSURE REPAIR    . BREAST BIOPSY Right   . CATARACT EXTRACTION Bilateral   . MASS EXCISION  04/11/2012   Procedure: EXCISION MASS;  Surgeon:  Wynonia Sours, MD;  Location: [facility];  Service: Orthopedics;  Laterality: Right;  Excision of mass right upper arm  . MEDIASTINOSCOPY  4/11  . NASAL SINUS SURGERY    . TOTAL ABDOMINAL HYSTERECTOMY W/ BILATERAL SALPINGOOPHORECTOMY      Allergies  Allergen Reactions  . Other Shortness Of Breath    Pt states she has asthma and seafood causes breathing problems.   . Shellfish Allergy Shortness Of Breath  . Iodine Other (See Comments)    REACTION: " UNSURE TOLD TO LIST AS ALLERGEN BY MD" REACTION: " UNSURE TOLD TO LIST AS ALLERGEN BY MD"  . Latex Itching  . Adhesive [Tape] Rash    Immunization History  Administered Date(s) Administered  . Influenza Whole 04/04/2009  . Influenza, High Dose Seasonal PF 12/31/2016, 12/17/2017, 12/02/2018  .  HPI Chief Complaint  Patient presents with  . Acute Visit    cough     Referring provider: [name], MD  HPI: 86 year old female never smoker followed for mild persistent asthma History of diastolic dysfunction Known chronic right middle lobe collapse/ lymphadenopathy -s/p 07/2007 FOB +psuedomonas  Test Exhaled nitric oxide test May 2018: 17 ppb High resolution CT chest August 2016 showed no evidence of interstitial lung disease, chronic collapse of right middle lobe MEdiastinaoscopy 07/27/2009 - Benign node of 4R and 4L. ANthracotoic pigment +. No granuoma. Non malignant PFT 10/28/2014 FEV1 94, ratio 54, FVC 135, no significant bronchodilator response, mid flows reversibility, DLCO 65%   04/25/2017 Acute OV : Asthma  Patient presents for an acute office visit.  She complains of 3 weeks of cough ,congestion sinus congestion, drainage , wheezing . Seen PCP dx w/ bronchitis , rx  , Doxycycline and Prednisone . Feels some better but still has chest congestion and tightness .  She remains on Breo daily.    Appetite is okay w/ no nv/d.  No chest pain, orthopnea or ede   OV 06/05/2017  Chief Complaint  Patient presents with  . Follow-up    Pt states she has been doing good since last visit. Pt states she does become SOB with exertion. Denies any cough or CP.    Fu Mild persiostent asthma with associated diastolic dysfunction  Contines on broe. Overall stable. But last  4 month has has had 2 surgeries for hammer toe on left food. So less active. Correlating with this more dyspnea on exertion than usual. Also, found to have mold in the bathroom and professionally removed . Has not affected her but she is asking for feno and walk test both of which aare normal. She wants sample breo       OV 12/17/2017  Subjective:  Patient ID: Anne [name], female , DOB: 03/11/1931 , age 84 y.o. , MRN: [id number] , ADDRESS: [place] [address]   12/17/2017 -    Chief Complaint  Patient presents with  . Follow-up    Pt states she has been doing well since last visit and denies any complaints.   Fu Mild persiostent asthma with associated diastolic dysfunction  HPI Anne [name] 86 y.o. -  Mild persistent asthma follow-up. Recently she was switched to Brio on Qvar. This is helped his symptoms. In addition she is in the mold in the house.She feels great. She is somewhat she has done well. No interim asthma exacerbations. She will have a high dose flu shot today. There are really no other issues other than some mild recurrence of costochondral chest pain in the last few days is a recurrent problem for her.     Results for HARVEY, KAMER (MRN [id number]) as of 06/05/2017 14:14  Ref. Range 08/07/2016 12:01 02/12/2017 11:30 06/05/2017 13:04  Nitric Oxide Unknown 17 18 20    OV 01/15/2019  Subjective:  Patient ID: Anne [name], female , DOB: [birth year] , age 74 y.o. , MRN: [id number] , ADDRESS: [place] [address]   01/15/2019 -   Chief Complaint  Patient presents with  . Follow-up    Pt states her breathing is doing well. Pt denies any increase in SOB, cough, CP/tightness, f/c/s, body aches.    86 year old female never smoker followed for mild persistent asthma History of diastolic dysfunction Known chronic right middle lobe collapse/ lymphadenopathy -s/p 07/2007 FOB +psuedomonas - MEdiastinaoscopy 07/27/2009 -  HPI Chief Complaint  Patient presents with  . Acute Visit    cough     Referring provider: [name], MD  HPI: 86 year old female never smoker followed for mild persistent asthma History of diastolic dysfunction Known chronic right middle lobe collapse/ lymphadenopathy -s/p 07/2007 FOB +psuedomonas  Test Exhaled nitric oxide test May 2018: 17 ppb High resolution CT chest August 2016 showed no evidence of interstitial lung disease, chronic collapse of right middle lobe MEdiastinaoscopy 07/27/2009 - Benign node of 4R and 4L. ANthracotoic pigment +. No granuoma. Non malignant PFT 10/28/2014 FEV1 94, ratio 54, FVC 135, no significant bronchodilator response, mid flows reversibility, DLCO 65%   04/25/2017 Acute OV : Asthma  Patient presents for an acute office visit.  She complains of 3 weeks of cough ,congestion sinus congestion, drainage , wheezing . Seen PCP dx w/ bronchitis , rx  , Doxycycline and Prednisone . Feels some better but still has chest congestion and tightness .  She remains on Breo daily.    Appetite is okay w/ no nv/d.  No chest pain, orthopnea or ede   OV 06/05/2017  Chief Complaint  Patient presents with  . Follow-up    Pt states she has been doing good since last visit. Pt states she does become SOB with exertion. Denies any cough or CP.    Fu Mild persiostent asthma with associated diastolic dysfunction  Contines on broe. Overall stable. But last  4 month has has had 2 surgeries for hammer toe on left food. So less active. Correlating with this more dyspnea on exertion than usual. Also, found to have mold in the bathroom and professionally removed . Has not affected her but she is asking for feno and walk test both of which aare normal. She wants sample breo       OV 12/17/2017  Subjective:  Patient ID: Anne [name], female , DOB: 03/11/1931 , age 84 y.o. , MRN: [id number] , ADDRESS: [place] [address]   12/17/2017 -    Chief Complaint  Patient presents with  . Follow-up    Pt states she has been doing well since last visit and denies any complaints.   Fu Mild persiostent asthma with associated diastolic dysfunction  HPI Anne [name] 86 y.o. -  Mild persistent asthma follow-up. Recently she was switched to Brio on Qvar. This is helped his symptoms. In addition she is in the mold in the house.She feels great. She is somewhat she has done well. No interim asthma exacerbations. She will have a high dose flu shot today. There are really no other issues other than some mild recurrence of costochondral chest pain in the last few days is a recurrent problem for her.     Results for HARVEY, KAMER (MRN [id number]) as of 06/05/2017 14:14  Ref. Range 08/07/2016 12:01 02/12/2017 11:30 06/05/2017 13:04  Nitric Oxide Unknown 17 18 20    OV 01/15/2019  Subjective:  Patient ID: Anne [name], female , DOB: [birth year] , age 74 y.o. , MRN: [id number] , ADDRESS: [place] [address]   01/15/2019 -   Chief Complaint  Patient presents with  . Follow-up    Pt states her breathing is doing well. Pt denies any increase in SOB, cough, CP/tightness, f/c/s, body aches.    86 year old female never smoker followed for mild persistent asthma History of diastolic dysfunction Known chronic right middle lobe collapse/ lymphadenopathy -s/p 07/2007 FOB +psuedomonas - MEdiastinaoscopy 07/27/2009 -

## 2019-08-19 DIAGNOSIS — G609 Hereditary and idiopathic neuropathy, unspecified: Secondary | ICD-10-CM | POA: Diagnosis not present

## 2019-08-19 NOTE — Progress Notes (Signed)
Cxr clear

## 2019-08-20 ENCOUNTER — Telehealth: Payer: Self-pay | Admitting: Internal Medicine

## 2019-08-20 NOTE — Telephone Encounter (Signed)
Brand Males, MD  08/19/2019 8:22 AM EDT    Cxr clear  ---------------------------------------- Spoke with pt. She is aware of results. Nothing further was needed.

## 2019-08-28 DIAGNOSIS — M549 Dorsalgia, unspecified: Secondary | ICD-10-CM | POA: Diagnosis not present

## 2019-09-03 DIAGNOSIS — R42 Dizziness and giddiness: Secondary | ICD-10-CM | POA: Diagnosis not present

## 2019-09-03 DIAGNOSIS — G4452 New daily persistent headache (NDPH): Secondary | ICD-10-CM | POA: Diagnosis not present

## 2019-09-08 DIAGNOSIS — L720 Epidermal cyst: Secondary | ICD-10-CM | POA: Diagnosis not present

## 2019-09-08 DIAGNOSIS — H0260 Xanthelasma of unspecified eye, unspecified eyelid: Secondary | ICD-10-CM | POA: Diagnosis not present

## 2019-09-10 DIAGNOSIS — M7061 Trochanteric bursitis, right hip: Secondary | ICD-10-CM | POA: Diagnosis not present

## 2019-09-15 ENCOUNTER — Other Ambulatory Visit: Payer: Self-pay

## 2019-09-15 MED ORDER — ALBUTEROL SULFATE (2.5 MG/3ML) 0.083% IN NEBU: 2.5000 mg | INHALATION_SOLUTION | Freq: Four times a day (QID) | RESPIRATORY_TRACT | 1 refills | Status: DC | PRN

## 2019-09-18 ENCOUNTER — Other Ambulatory Visit: Payer: Self-pay | Admitting: *Deleted

## 2019-09-18 ENCOUNTER — Telehealth: Payer: Self-pay | Admitting: Internal Medicine

## 2019-09-18 DIAGNOSIS — M7061 Trochanteric bursitis, right hip: Secondary | ICD-10-CM | POA: Diagnosis not present

## 2019-09-18 DIAGNOSIS — M545 Low back pain: Secondary | ICD-10-CM | POA: Diagnosis not present

## 2019-09-18 MED ORDER — ALBUTEROL SULFATE HFA 108 (90 BASE) MCG/ACT IN AERS: INHALATION_SPRAY | RESPIRATORY_TRACT | 3 refills | Status: DC

## 2019-09-18 MED ORDER — ALBUTEROL SULFATE (2.5 MG/3ML) 0.083% IN NEBU: 2.5000 mg | INHALATION_SOLUTION | Freq: Four times a day (QID) | RESPIRATORY_TRACT | 3 refills | Status: DC | PRN

## 2019-09-18 NOTE — Telephone Encounter (Signed)
Rx for pt's neb sol has been sent to preferred pharmacy for pt. Called and spoke with pt letting her know this had been done and she verbalized understanding. Nothing further needed.

## 2019-10-06 ENCOUNTER — Other Ambulatory Visit: Payer: Self-pay | Admitting: Internal Medicine

## 2019-10-12 ENCOUNTER — Other Ambulatory Visit (HOSPITAL_COMMUNITY): Payer: Self-pay | Admitting: Orthopedic Surgery

## 2019-10-12 ENCOUNTER — Encounter (HOSPITAL_COMMUNITY): Payer: Medicare PPO

## 2019-10-12 DIAGNOSIS — M545 Low back pain: Secondary | ICD-10-CM | POA: Diagnosis not present

## 2019-10-12 DIAGNOSIS — M79605 Pain in left leg: Secondary | ICD-10-CM

## 2019-10-13 ENCOUNTER — Ambulatory Visit (HOSPITAL_COMMUNITY): Admission: RE | Admit: 2019-10-13 | Payer: Medicare PPO | Source: Ambulatory Visit

## 2019-10-15 ENCOUNTER — Other Ambulatory Visit: Payer: Self-pay

## 2019-10-15 ENCOUNTER — Ambulatory Visit (HOSPITAL_COMMUNITY)
Admission: RE | Admit: 2019-10-15 | Discharge: 2019-10-15 | Disposition: A | Discharge status: Home / Self Care | Payer: Medicare PPO | Source: Ambulatory Visit | Attending: Orthopedic Surgery | Admitting: Orthopedic Surgery

## 2019-10-15 DIAGNOSIS — M79605 Pain in left leg: Secondary | ICD-10-CM

## 2019-10-15 NOTE — Progress Notes (Signed)
Left lower extremity venous duplex completed. Refer to "CV Proc" under chart review to view preliminary results.  10/15/2019 10:49 AM Kelby Aline., MHA, RVT, RDCS, RDMS

## 2019-10-30 ENCOUNTER — Other Ambulatory Visit: Payer: Self-pay | Admitting: Geriatric Medicine

## 2019-10-30 DIAGNOSIS — Z1231 Encounter for screening mammogram for malignant neoplasm of breast: Secondary | ICD-10-CM

## 2019-11-17 DIAGNOSIS — M545 Low back pain: Secondary | ICD-10-CM | POA: Diagnosis not present

## 2019-11-17 DIAGNOSIS — M4126 Other idiopathic scoliosis, lumbar region: Secondary | ICD-10-CM | POA: Diagnosis not present

## 2019-11-25 DIAGNOSIS — S93491A Sprain of other ligament of right ankle, initial encounter: Secondary | ICD-10-CM | POA: Diagnosis not present

## 2019-11-26 DIAGNOSIS — M545 Low back pain: Secondary | ICD-10-CM | POA: Diagnosis not present

## 2019-11-26 DIAGNOSIS — M4126 Other idiopathic scoliosis, lumbar region: Secondary | ICD-10-CM | POA: Diagnosis not present

## 2019-12-02 DIAGNOSIS — Z6826 Body mass index (BMI) 26.0-26.9, adult: Secondary | ICD-10-CM | POA: Diagnosis not present

## 2019-12-02 DIAGNOSIS — M8580 Other specified disorders of bone density and structure, unspecified site: Secondary | ICD-10-CM | POA: Diagnosis not present

## 2019-12-02 DIAGNOSIS — Z01411 Encounter for gynecological examination (general) (routine) with abnormal findings: Secondary | ICD-10-CM | POA: Diagnosis not present

## 2019-12-02 DIAGNOSIS — Z1231 Encounter for screening mammogram for malignant neoplasm of breast: Secondary | ICD-10-CM | POA: Diagnosis not present

## 2019-12-02 DIAGNOSIS — Z1211 Encounter for screening for malignant neoplasm of colon: Secondary | ICD-10-CM | POA: Diagnosis not present

## 2019-12-08 ENCOUNTER — Ambulatory Visit
Admission: RE | Admit: 2019-12-08 | Discharge: 2019-12-08 | Disposition: A | Discharge status: Home / Self Care | Payer: Medicare PPO | Source: Ambulatory Visit | Attending: Geriatric Medicine | Admitting: Geriatric Medicine

## 2019-12-08 ENCOUNTER — Other Ambulatory Visit: Payer: Self-pay

## 2019-12-08 DIAGNOSIS — J301 Allergic rhinitis due to pollen: Secondary | ICD-10-CM | POA: Diagnosis not present

## 2019-12-08 DIAGNOSIS — Z23 Encounter for immunization: Secondary | ICD-10-CM | POA: Diagnosis not present

## 2019-12-08 DIAGNOSIS — Z1231 Encounter for screening mammogram for malignant neoplasm of breast: Secondary | ICD-10-CM | POA: Diagnosis not present

## 2019-12-08 DIAGNOSIS — R1319 Other dysphagia: Secondary | ICD-10-CM | POA: Diagnosis not present

## 2019-12-08 DIAGNOSIS — K219 Gastro-esophageal reflux disease without esophagitis: Secondary | ICD-10-CM | POA: Diagnosis not present

## 2019-12-17 ENCOUNTER — Other Ambulatory Visit: Payer: Self-pay

## 2019-12-17 ENCOUNTER — Ambulatory Visit (INDEPENDENT_AMBULATORY_CARE_PROVIDER_SITE_OTHER): Payer: Medicare PPO | Admitting: Plastic Surgery

## 2019-12-17 ENCOUNTER — Encounter: Payer: Self-pay | Admitting: Plastic Surgery

## 2019-12-17 VITALS — BP 149/70 | HR 72 | Temp 98.2°F | Ht 67.0 in | Wt 162.4 lb

## 2019-12-17 DIAGNOSIS — H0266 Xanthelasma of left eye, unspecified eyelid: Secondary | ICD-10-CM | POA: Diagnosis not present

## 2019-12-17 DIAGNOSIS — H0263 Xanthelasma of right eye, unspecified eyelid: Secondary | ICD-10-CM | POA: Diagnosis not present

## 2019-12-17 NOTE — Progress Notes (Signed)
Referring Provider [name], MD [address] [place],  [name] [address]   CC:  Chief Complaint  Patient presents with  . Advice Only      Anne [name] is an 86 y.o. female.  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 Is been present for a number of years and she did have it excised at one point previously. It has recurred and looks a bit worse on the left side according to her than it did before. She is bothered by the appearance and wants to have it excised.  Allergies  Allergen Reactions  . Other Shortness Of Breath    Pt states she has asthma and seafood causes breathing problems.   . Shellfish Allergy Shortness Of Breath  . Iodine Other (See Comments)    REACTION: " UNSURE TOLD TO LIST AS ALLERGEN BY MD" REACTION: " UNSURE TOLD TO LIST AS ALLERGEN BY MD"  . Latex Itching  . Adhesive [Tape] Rash    Outpatient Encounter Medications as of 12/17/2019  Medication Sig  . albuterol (PROAIR HFA) 108 (90 Base) MCG/ACT inhaler INHALE 1-2 PUFFS INTO THE LUNGS EVERY 6 (SIX) HOURS AS NEEDED FOR WHEEZING OR SHORTNESS OF BREATH.  Marland Kitchen albuterol (PROVENTIL) (2.5 MG/3ML) 0.083% nebulizer solution Take 3 mLs (2.5 mg total) by nebulization every 6 (six) hours as needed for wheezing or shortness of breath.  Marland Kitchen amLODipine (NORVASC) 5 MG tablet Take 1 tablet (5 mg total) by mouth daily for 90 doses.  Marland Kitchen BREO ELLIPTA 100-25 MCG/INH AEPB TAKE 1 PUFF BY MOUTH EVERY DAY  . Calcium Carb-Cholecalciferol (CALCIUM 600 + D) 600-200 MG-UNIT TABS Take 2 tablets by mouth daily.   . cetirizine (ZYRTEC) 10 MG tablet Take 10 mg by mouth every morning.  . ferrous sulfate 325 (65 FE) MG tablet Take 325 mg by mouth daily with breakfast. Three times a week  . fluticasone (FLONASE) 50 MCG/ACT nasal spray Place 1 spray into both nostrils 2 (two) times daily as needed for allergies.   Marland Kitchen gabapentin (NEURONTIN) 100 MG capsule   . latanoprost (XALATAN)  0.005 % ophthalmic solution Place 1 drop into both eyes at bedtime.  . meloxicam (MOBIC) 7.5 MG tablet Take 7.5 mg by mouth daily.  . Multiple Vitamin (MULTIVITAMIN) tablet Take 1 tablet by mouth every morning.   . TURMERIC PO Take 1 capsule by mouth daily.  . vitamin B-12 (CYANOCOBALAMIN) 1000 MCG tablet Take by mouth.   Facility-Administered Encounter Medications as of 12/17/2019  Medication  . regadenoson (LEXISCAN) injection SOLN 0.4 mg     Past Medical History:  Diagnosis Date  . Allergic rhinitis   . Arthritis   . Asthma   . Atypical chest pain   . Collapse of right lung   . Dyslipidemia   . Gastroesophageal reflux disease   . Glaucoma   . Hematuria   . Hyperlipidemia   . Lung nodules     Past Surgical History:  Procedure Laterality Date  . ANAL FISSURE REPAIR    . BREAST BIOPSY Right   . CATARACT EXTRACTION Bilateral   . MASS EXCISION  04/11/2012   Procedure: EXCISION MASS;  Surgeon: Wynonia Sours, MD;  Location: [facility];  Service: Orthopedics;  Laterality: Right;  Excision of mass right upper arm  . MEDIASTINOSCOPY  4/11  . NASAL SINUS SURGERY    . TOTAL ABDOMINAL HYSTERECTOMY W/ BILATERAL SALPINGOOPHORECTOMY      Family History  Problem  Relation Age of Onset  . Cancer Mother   . Heart disease Father   . Breast cancer Cousin   . Heart attack Neg Hx     Social History   Social History Narrative   Retired Product manager.  Lives alone, husband passed away in 06/26/2016.  No children.   Right handed   One level home     Review of Systems General: Denies fevers, chills, weight loss CV: Denies chest pain, shortness of breath, palpitations  Physical Exam Vitals with BMI 12/17/2019 08/17/2019 05/31/2019  Height 5\' 7"  5\' 7"  -  Weight 162 lbs 6 oz 163 lbs 13 oz -  BMI 27.03 50.09 -  Systolic 381 829 937  Diastolic 70 58 60  Pulse 72 72 101    General:  No acute distress,  Alert and oriented, Non-Toxic, Normal speech and affect Examination  shows approximately 1 cm in greatest diameter xanthelasma on the inner aspect of bilateral upper eyelids. She has a prominent nasal fat pad underneath it. She has quite a bit of dermatochalasis as well.  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 Offered excision under local. Discussed the risks include bleeding, infection, damage to surrounding structures need for additional procedures. I also discussed the potential for recurrence again. She wants to think about this and will reach back out to [place] when she is made a decision.  [name] 12/17/2019, 5:56 PM

## 2019-12-29 ENCOUNTER — Ambulatory Visit: Payer: Medicare PPO | Attending: Internal Medicine

## 2019-12-29 DIAGNOSIS — Z23 Encounter for immunization: Secondary | ICD-10-CM

## 2019-12-29 NOTE — Progress Notes (Signed)
Covid-19 Vaccination Clinic  Name:  Anne [name]    MRN: [id number] DOB: 1931/02/27  12/29/2019  [name] was observed post Covid-19 immunization for 15 minutes without incident. She was provided with Vaccine Information Sheet and instruction to access the V-Safe system.   [name] was instructed to call 911 with any severe reactions post vaccine:  Difficulty breathing   Swelling of face and throat   A fast heartbeat   A bad rash all over body   Dizziness and weakness

## 2020-01-11 DIAGNOSIS — I83813 Varicose veins of bilateral lower extremities with pain: Secondary | ICD-10-CM | POA: Diagnosis not present

## 2020-01-11 DIAGNOSIS — E7800 Pure hypercholesterolemia, unspecified: Secondary | ICD-10-CM | POA: Diagnosis not present

## 2020-01-11 DIAGNOSIS — Z0000 Encounter for general adult medical examination without abnormal findings: Secondary | ICD-10-CM | POA: Diagnosis not present

## 2020-01-11 DIAGNOSIS — G609 Hereditary and idiopathic neuropathy, unspecified: Secondary | ICD-10-CM | POA: Diagnosis not present

## 2020-01-11 DIAGNOSIS — Z79899 Other long term (current) drug therapy: Secondary | ICD-10-CM | POA: Diagnosis not present

## 2020-01-11 DIAGNOSIS — R42 Dizziness and giddiness: Secondary | ICD-10-CM | POA: Diagnosis not present

## 2020-01-11 DIAGNOSIS — Z1389 Encounter for screening for other disorder: Secondary | ICD-10-CM | POA: Diagnosis not present

## 2020-01-11 DIAGNOSIS — J4540 Moderate persistent asthma, uncomplicated: Secondary | ICD-10-CM | POA: Diagnosis not present

## 2020-01-11 DIAGNOSIS — R2681 Unsteadiness on feet: Secondary | ICD-10-CM | POA: Diagnosis not present

## 2020-01-14 ENCOUNTER — Ambulatory Visit: Payer: Medicare PPO | Admitting: Neurology

## 2020-01-14 ENCOUNTER — Ambulatory Visit (HOSPITAL_COMMUNITY)
Admission: EM | Admit: 2020-01-14 | Discharge: 2020-01-14 | Disposition: A | Discharge status: Home / Self Care | Payer: Medicare PPO

## 2020-01-14 ENCOUNTER — Encounter: Payer: Self-pay | Admitting: Neurology

## 2020-01-14 ENCOUNTER — Other Ambulatory Visit: Payer: Self-pay

## 2020-01-14 VITALS — BP 154/72 | HR 75 | Ht 67.0 in | Wt 161.0 lb

## 2020-01-14 DIAGNOSIS — G5603 Carpal tunnel syndrome, bilateral upper limbs: Secondary | ICD-10-CM

## 2020-01-14 DIAGNOSIS — T881XXA Other complications following immunization, not elsewhere classified, initial encounter: Secondary | ICD-10-CM | POA: Diagnosis not present

## 2020-01-14 DIAGNOSIS — G629 Polyneuropathy, unspecified: Secondary | ICD-10-CM | POA: Diagnosis not present

## 2020-01-14 MED ORDER — GABAPENTIN 100 MG PO CAPS: 200.0000 mg | ORAL_CAPSULE | Freq: Every day | ORAL | 3 refills | Status: DC

## 2020-01-14 NOTE — Progress Notes (Signed)
Follow-up Visit   Date: 01/14/20    Anne [name] MRN: [id number] DOB: Dec 13, 1930   Interim History: Rececca [name] is a 86 y.o. right-handed African American female with asthma, GERD, hyperlipidemia returning the clinic for follow-up of bilateral feet pain.  The patient was accompanied to the clinic by self.  History of present illness: Since early 2017/02/21, she has intermittent stabbing pain over the tip of her left second toe, which occurs sporadically especially at night. Pain is worse with standing or wearing constrictive shoes.  She also complains of dull pain over the medial lower leg and ankle.  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  Unfortunately, she did not have any relief of her foot pain.  She has tried ibuprofen, Aleve, and gabapentin without any relief. MRI left foot which shows nonspecific soft tissue in the second toe with possible postsurgical changes in the flexor digitorum longus tendon. NCS/EMG was normal and did not show evidence of neuropathy or lumbosacral radiculopathy.   She does not have history of diabetes.  She denies any weakness or imbalance and continues to walk unassisted.  She lives alone in a one-level home; her husband passed away in February 21, 2017.   UPDATE 2017-12-14:   Starting in August 2019, she began having tingling and burning pain involving left 2nd toe, but she has also noticed numbness involving all the toes.  Pain is worse in the evening and prevents her from sleeping.  She has taken gabapentin 300mg  at bedtime but make feel groggy and has not provided any benefit. She has found a CBD derivative ointment which she applies 2-3 times a day, which seems to provide some relief.She has no weakness or imbalance. She will be celebrating 87th birthday this weekend.  UPDATE 01/14/2020:  She is here for follow-up visit.  She is taking gabapentin 300mg  at  bedtime for headaches, written by Dr. Neale Burly.  She tells me that her PCP has requested her to reduce the dose because of concerns of imbalance.  She has questions about this.  Her neuropathy has been stable and she denies severe or worsening pain. She no longer has headaches. She has been researching neuropathy online and has questions about her prognosis, specifically chance of amputation or requiring a wheelchair.    Medications:  Current Outpatient Medications on File Prior to Visit  Medication Sig Dispense Refill  . albuterol (PROAIR HFA) 108 (90 Base) MCG/ACT inhaler INHALE 1-2 PUFFS INTO THE LUNGS EVERY 6 (SIX) HOURS AS NEEDED FOR WHEEZING OR SHORTNESS OF BREATH. 8.5 g 3  . albuterol (PROVENTIL) (2.5 MG/3ML) 0.083% nebulizer solution Take 3 mLs (2.5 mg total) by nebulization every 6 (six) hours as needed for wheezing or shortness of breath. 1080 mL 3  . amLODipine (NORVASC) 5 MG tablet Take 1 tablet (5 mg total) by mouth daily for 90 doses. 90 tablet 0  . BREO ELLIPTA 100-25 MCG/INH AEPB TAKE 1 PUFF BY MOUTH EVERY DAY 60 each 4  . Calcium Carb-Cholecalciferol (CALCIUM 600 + D) 600-200 MG-UNIT TABS Take 2 tablets by mouth daily.     . cetirizine (ZYRTEC) 10 MG tablet Take 10 mg by mouth every morning.    . ferrous sulfate 325 (65 FE) MG tablet Take 325 mg by mouth daily with breakfast. Three times a week    . fluticasone (FLONASE) 50 MCG/ACT nasal spray Place 1 spray into both nostrils 2 (two)  times daily as needed for allergies.     Marland Kitchen gabapentin (NEURONTIN) 100 MG capsule     . latanoprost (XALATAN) 0.005 % ophthalmic solution Place 1 drop into both eyes at bedtime.    . Multiple Vitamin (MULTIVITAMIN) tablet Take 1 tablet by mouth every morning.     . TURMERIC PO Take 1 capsule by mouth daily.    . vitamin B-12 (CYANOCOBALAMIN) 1000 MCG tablet Take by mouth.    . meloxicam (MOBIC) 7.5 MG tablet Take 7.5 mg by mouth daily. (Patient not taking: Reported on 01/14/2020)     Current  Facility-Administered Medications on File Prior to Visit  Medication Dose Route Frequency Provider Last Rate Last Admin  . regadenoson (LEXISCAN) injection SOLN 0.4 mg  0.4 mg Intravenous Once Quintella Reichert, MD        Allergies:  Allergies  Allergen Reactions  . Other Shortness Of Breath    Pt states she has asthma and seafood causes breathing problems.   . Shellfish Allergy Shortness Of Breath  . Iodine Other (See Comments)    REACTION: " UNSURE TOLD TO LIST AS ALLERGEN BY MD" REACTION: " UNSURE TOLD TO LIST AS ALLERGEN BY MD"  . Latex Itching  . Adhesive [Tape] Rash     Vital Signs:  BP (!) 154/72   Pulse 75   Ht 5\' 7"  (1.702 m)   Wt 161 lb (73 kg)   SpO2 100%   BMI 25.22 kg/m     Neurological Exam: MENTAL STATUS including orientation to time, place, person, recent and remote memory, attention span and concentration, language, and fund of knowledge is normal.  Speech is not dysarthric.  CRANIAL NERVES: Extraocular muscle intact.  No ptosis   MOTOR:  Motor strength is 5/5 in all extremities, including distally.  No atrophy, fasciculations or abnormal movements.  No pronator drift.  Tone is normal.    MSRs:  Reflexes are 2+/4 throughout, except absent bilateral Achilles.  SENSORY:  Vibration is absent at the great toe bilaterally, and intact above the ankles.   COORDINATION/GAIT:  Gait narrow based and stable. Tandem gait intact.   Data: NCS/EMG of the left lower extremity 04/11/2017:  This is a normal study of the left lower extremity. In particular, there is no evidence of a sensorimotor polyneuropathy or lumbosacral radiculopathy.  MRI left foot 02/19/2017: 1. Mild nonspecific soft tissue edema in the second toe with possible postsurgical changes in the flexor digitorum longus tendon. 2. No significant arthropathic changes or acute osseous findings in the second toe. There are mild hammertoe deformities of the digits.  3. Moderate degenerative changes at the first  metatarsal phalangeal joint.  Lab Results  Component Value Date   VITAMINB12 1,489 (H) 12/13/2017   Lab Results  Component Value Date   FOLATE >24.1 12/13/2017     IMPRESSION/PLAN: 1.  Idiopathic peripheral neuropathy, mild sensory symptoms.  No sensory ataxia or weakness.   - Reduce gabapentin to 200mg  at bedtime  - Continue physical therapy for balance training  - She had many questions which I addressed and strongly advised patient to avoid googling symptoms online  2. Bilateral carpal tunnel syndrome, mild  - Stable  Return to clinic in 6 months  Total time spent reviewing records, interview, history/exam, documentation, and coordination of care on day of encounter:  30 min      Thank you for allowing me to participate in patient's care.  If I can answer any additional questions, I would be pleased  to do so.    Sincerely,    Ranisha Allaire K. Allena Katz, DO

## 2020-01-18 ENCOUNTER — Other Ambulatory Visit: Payer: Self-pay | Admitting: Geriatric Medicine

## 2020-01-18 DIAGNOSIS — Z780 Asymptomatic menopausal state: Secondary | ICD-10-CM

## 2020-01-18 DIAGNOSIS — M8580 Other specified disorders of bone density and structure, unspecified site: Secondary | ICD-10-CM

## 2020-02-01 DIAGNOSIS — J310 Chronic rhinitis: Secondary | ICD-10-CM | POA: Diagnosis not present

## 2020-02-01 DIAGNOSIS — J329 Chronic sinusitis, unspecified: Secondary | ICD-10-CM | POA: Diagnosis not present

## 2020-02-08 DIAGNOSIS — R202 Paresthesia of skin: Secondary | ICD-10-CM | POA: Diagnosis not present

## 2020-02-08 DIAGNOSIS — R079 Chest pain, unspecified: Secondary | ICD-10-CM | POA: Diagnosis not present

## 2020-02-09 ENCOUNTER — Encounter: Payer: Self-pay | Admitting: Physical Therapy

## 2020-02-09 ENCOUNTER — Ambulatory Visit: Payer: Medicare PPO | Attending: Geriatric Medicine | Admitting: Physical Therapy

## 2020-02-09 ENCOUNTER — Other Ambulatory Visit: Payer: Self-pay

## 2020-02-09 DIAGNOSIS — R42 Dizziness and giddiness: Secondary | ICD-10-CM | POA: Diagnosis not present

## 2020-02-09 DIAGNOSIS — R262 Difficulty in walking, not elsewhere classified: Secondary | ICD-10-CM | POA: Insufficient documentation

## 2020-02-09 DIAGNOSIS — R2681 Unsteadiness on feet: Secondary | ICD-10-CM

## 2020-02-09 NOTE — Therapy (Addendum)
[facility] [address] [place], [place], [address] Phone: [phone]   Fax:  [phone]  Physical Therapy Evaluation  Patient Details  Name: Anne [name] MRN: [id number] Date of Birth: 04-27-1930 Referring Provider (PT): Dr. [name]   Encounter Date: 02/09/2020   PT End of Session - 02/09/20 1649    Visit Number 1    Number of Visits 13    Date for PT Re-Evaluation 04/09/20    Authorization Type [employer]    Authorization Time Period 10th visit PN    PT Start Time 1411    PT Stop Time 1500    PT Time Calculation (min) 49 min    Activity Tolerance Patient tolerated treatment well    Behavior During Therapy [facility] for tasks assessed/performed           Past Medical History:  Diagnosis Date  . Allergic rhinitis   . Arthritis   . Asthma   . Atypical chest pain   . Collapse of right lung   . Dyslipidemia   . Gastroesophageal reflux disease   . Glaucoma   . Hematuria   . Hyperlipidemia   . Lung nodules     Past Surgical History:  Procedure Laterality Date  . ANAL FISSURE REPAIR    . BREAST BIOPSY Right   . CATARACT EXTRACTION Bilateral   . MASS EXCISION  04/11/2012   Procedure: EXCISION MASS;  Surgeon: Wynonia Sours, MD;  Location: [facility];  Service: Orthopedics;  Laterality: Right;  Excision of mass right upper arm  . MEDIASTINOSCOPY  4/11  . NASAL SINUS SURGERY    . TOTAL ABDOMINAL HYSTERECTOMY W/ BILATERAL SALPINGOOPHORECTOMY      There were no vitals filed for this visit.    Subjective Assessment - 02/09/20 1416    Subjective Pt reports first attack of dizziness was at the beginning of the year; she was leaning over to turn off the light beside her bed and she felt spinning.  Pt participated in vestibular rehab at [facility] for treatment of R BPPV and then participated in assessment at dizziness and balance clinic in [place]. and they diagnosed pt with vestibular  neuronitis.  Symptoms resolved in May.  Second episode began a week ago.  Pt reports this episode is different and is more of a dizziness with quick movements and supine >sit.  Reports nausea but no vomiting, heavy eyes and head sensation, has decreased hearing and now has hearing aides, reports greater difficulty with reading up close but no diplopia, reports intermittent tinnitus and slight stiffness in neck but no pain.    Pertinent History allergic rhinitis, OA, asthma, atypical chest pain, collapse of right lung, dyslipidemia, GERD, glaucoma, hematuria, HLD, lung nodules, R breast biopsy, bilat cataract extraction, nasal sinus surgery    Patient Stated Goals resolve the vertigo and improve balance    Currently in Pain? No/denies    Pain Score 6    Dizziness             [facility] PT Assessment - 02/09/20 1425      Assessment   Medical Diagnosis BPPV    Referring Provider (PT) Dr. [name]    Onset Date/Surgical Date 02/04/20    Prior Therapy yes [facility] for BPPV      Precautions   Precautions Other (comment)    Precaution Comments allergic rhinitis, OA, asthma, atypical chest pain, collapse of right lung, dyslipidemia, GERD, glaucoma, hematuria, HLD, lung nodules, R  Stability/Clinical Decision Making Stable/Uncomplicated    Clinical Decision Making Low    Rehab Potential Good    PT Frequency 2x / week    PT Duration 6 weeks    PT Treatment/Interventions ADLs/Self Care Home Management;Vestibular;Therapeutic activities;Therapeutic exercise;Balance training;Neuromuscular re-education;Canalith Repostioning;Gait training;Patient/family education    PT Next Visit Plan Assess DVA, FGA, MSQ and orthostatics. Begin VORx1 and balance training.    PT Home Exercise Plan --    Consulted and Agree with Plan of Care Patient           Patient will benefit from skilled therapeutic intervention in order to improve the following deficits and impairments:  Dizziness, Decreased balance  Visit Diagnosis: Dizziness and giddiness  Unsteadiness on feet  Difficulty in walking, not elsewhere classified     Problem List Patient Active Problem List   Diagnosis Date Noted  . Arthritis 04/08/2018  . Glaucoma 04/08/2018  .  Herpes simplex type 2 infection 04/08/2018  . Uterine leiomyoma 04/08/2018  . Non-recurrent acute suppurative otitis media of left ear with spontaneous rupture of tympanic membrane 01/10/2018  . Pain 05/29/2017  . Throat pain in adult 05/28/2017  . Thrush of mouth and esophagus ([place]) 05/28/2017  . Acute bacterial sinusitis 10/10/2016  . De Quervain's syndrome (tenosynovitis) 01/25/2016  . Primary osteoarthritis of first carpometacarpal joint of right hand 01/25/2016  . Trigger finger of right thumb 01/25/2016  . Essential hypertension 12/19/2015  . Diastolic dysfunction 05/25/3610  . Dysphagia 01/04/2015  . Chest tightness 11/16/2014  . Dyspnea 11/15/2014  . COPD GOLD I 11/02/2014  . GERD (gastroesophageal reflux disease) 11/02/2014  . Proptosis 05/31/2014  . Glaucoma, bilateral 05/31/2014  . Ptosis of both eyelids 05/31/2014  . Xanthelasma of left upper eyelid 05/31/2014  . Pain in limb 08/18/2012  . Routine gynecological examination 07/24/2011  . Environmental allergies 07/24/2011  . Osteoporosis 07/24/2011  . Nocturia 07/24/2011  . Asthma 07/04/2007  . PULMONARY COLLAPSE 07/04/2007  . PULMONARY NODULE 07/04/2007    Rosalita Levan, SPT 02/09/2020, [facility] [address] [place], [place], [address] Phone: [phone]   Fax:  [phone]  Name: Anne [name] MRN: [id number] Date of Birth: 1930/05/06  Stability/Clinical Decision Making Stable/Uncomplicated    Clinical Decision Making Low    Rehab Potential Good    PT Frequency 2x / week    PT Duration 6 weeks    PT Treatment/Interventions ADLs/Self Care Home Management;Vestibular;Therapeutic activities;Therapeutic exercise;Balance training;Neuromuscular re-education;Canalith Repostioning;Gait training;Patient/family education    PT Next Visit Plan Assess DVA, FGA, MSQ and orthostatics. Begin VORx1 and balance training.    PT Home Exercise Plan --    Consulted and Agree with Plan of Care Patient           Patient will benefit from skilled therapeutic intervention in order to improve the following deficits and impairments:  Dizziness, Decreased balance  Visit Diagnosis: Dizziness and giddiness  Unsteadiness on feet  Difficulty in walking, not elsewhere classified     Problem List Patient Active Problem List   Diagnosis Date Noted  . Arthritis 04/08/2018  . Glaucoma 04/08/2018  .  Herpes simplex type 2 infection 04/08/2018  . Uterine leiomyoma 04/08/2018  . Non-recurrent acute suppurative otitis media of left ear with spontaneous rupture of tympanic membrane 01/10/2018  . Pain 05/29/2017  . Throat pain in adult 05/28/2017  . Thrush of mouth and esophagus ([place]) 05/28/2017  . Acute bacterial sinusitis 10/10/2016  . De Quervain's syndrome (tenosynovitis) 01/25/2016  . Primary osteoarthritis of first carpometacarpal joint of right hand 01/25/2016  . Trigger finger of right thumb 01/25/2016  . Essential hypertension 12/19/2015  . Diastolic dysfunction 05/25/3610  . Dysphagia 01/04/2015  . Chest tightness 11/16/2014  . Dyspnea 11/15/2014  . COPD GOLD I 11/02/2014  . GERD (gastroesophageal reflux disease) 11/02/2014  . Proptosis 05/31/2014  . Glaucoma, bilateral 05/31/2014  . Ptosis of both eyelids 05/31/2014  . Xanthelasma of left upper eyelid 05/31/2014  . Pain in limb 08/18/2012  . Routine gynecological examination 07/24/2011  . Environmental allergies 07/24/2011  . Osteoporosis 07/24/2011  . Nocturia 07/24/2011  . Asthma 07/04/2007  . PULMONARY COLLAPSE 07/04/2007  . PULMONARY NODULE 07/04/2007    Rosalita Levan, SPT 02/09/2020, [facility] [address] [place], [place], [address] Phone: [phone]   Fax:  [phone]  Name: Anne [name] MRN: [id number] Date of Birth: 1930/05/06  Stability/Clinical Decision Making Stable/Uncomplicated    Clinical Decision Making Low    Rehab Potential Good    PT Frequency 2x / week    PT Duration 6 weeks    PT Treatment/Interventions ADLs/Self Care Home Management;Vestibular;Therapeutic activities;Therapeutic exercise;Balance training;Neuromuscular re-education;Canalith Repostioning;Gait training;Patient/family education    PT Next Visit Plan Assess DVA, FGA, MSQ and orthostatics. Begin VORx1 and balance training.    PT Home Exercise Plan --    Consulted and Agree with Plan of Care Patient           Patient will benefit from skilled therapeutic intervention in order to improve the following deficits and impairments:  Dizziness, Decreased balance  Visit Diagnosis: Dizziness and giddiness  Unsteadiness on feet  Difficulty in walking, not elsewhere classified     Problem List Patient Active Problem List   Diagnosis Date Noted  . Arthritis 04/08/2018  . Glaucoma 04/08/2018  .  Herpes simplex type 2 infection 04/08/2018  . Uterine leiomyoma 04/08/2018  . Non-recurrent acute suppurative otitis media of left ear with spontaneous rupture of tympanic membrane 01/10/2018  . Pain 05/29/2017  . Throat pain in adult 05/28/2017  . Thrush of mouth and esophagus ([place]) 05/28/2017  . Acute bacterial sinusitis 10/10/2016  . De Quervain's syndrome (tenosynovitis) 01/25/2016  . Primary osteoarthritis of first carpometacarpal joint of right hand 01/25/2016  . Trigger finger of right thumb 01/25/2016  . Essential hypertension 12/19/2015  . Diastolic dysfunction 05/25/3610  . Dysphagia 01/04/2015  . Chest tightness 11/16/2014  . Dyspnea 11/15/2014  . COPD GOLD I 11/02/2014  . GERD (gastroesophageal reflux disease) 11/02/2014  . Proptosis 05/31/2014  . Glaucoma, bilateral 05/31/2014  . Ptosis of both eyelids 05/31/2014  . Xanthelasma of left upper eyelid 05/31/2014  . Pain in limb 08/18/2012  . Routine gynecological examination 07/24/2011  . Environmental allergies 07/24/2011  . Osteoporosis 07/24/2011  . Nocturia 07/24/2011  . Asthma 07/04/2007  . PULMONARY COLLAPSE 07/04/2007  . PULMONARY NODULE 07/04/2007    Rosalita Levan, SPT 02/09/2020, [facility] [address] [place], [place], [address] Phone: [phone]   Fax:  [phone]  Name: Anne [name] MRN: [id number] Date of Birth: 1930/05/06

## 2020-02-10 ENCOUNTER — Other Ambulatory Visit: Payer: Self-pay | Admitting: Geriatric Medicine

## 2020-02-10 DIAGNOSIS — Z780 Asymptomatic menopausal state: Secondary | ICD-10-CM

## 2020-02-11 ENCOUNTER — Ambulatory Visit: Payer: Medicare PPO

## 2020-02-15 ENCOUNTER — Other Ambulatory Visit: Payer: Self-pay

## 2020-02-15 ENCOUNTER — Ambulatory Visit: Payer: Medicare PPO | Admitting: Physical Therapy

## 2020-02-15 ENCOUNTER — Encounter: Payer: Self-pay | Admitting: Physical Therapy

## 2020-02-15 VITALS — BP 170/80

## 2020-02-15 DIAGNOSIS — R2681 Unsteadiness on feet: Secondary | ICD-10-CM | POA: Diagnosis not present

## 2020-02-15 DIAGNOSIS — R42 Dizziness and giddiness: Secondary | ICD-10-CM | POA: Diagnosis not present

## 2020-02-15 DIAGNOSIS — R262 Difficulty in walking, not elsewhere classified: Secondary | ICD-10-CM | POA: Diagnosis not present

## 2020-02-15 NOTE — Patient Instructions (Signed)
Gaze Stabilization - Tip Card  1.Target must remain in focus, not blurry, and appear stationary while head is in motion. 2.Perform exercises with small head movements (45 to either side of midline). 3.Increase speed of head motion so long as target is in focus. 4.If you wear eyeglasses, be sure you can see target through lens (therapist will give specific instructions for bifocal / progressive lenses). 5.These exercises may provoke dizziness or nausea. Work through these symptoms. If too dizzy, slow head movement slightly. Rest between each exercise. 6.Exercises demand concentration; avoid distractions. 7.For safety, perform standing exercises close to a counter, wall, corner, or next to someone.  Copyright  [employer]. All rights reserved.   Gaze Stabilization - Standing Feet Apart   Feet shoulder width apart, keeping eyes on target on wall 3 feet away, tilt head down slightly and move head side to side for 30 seconds.  Do 2-3 sessions per day.

## 2020-02-15 NOTE — Therapy (Signed)
[facility] [address] [place], [place], [address] Phone: [phone]   Fax:  [phone]  Physical Therapy Treatment  Patient Details  Name: Anne [name] MRN: [id number] Date of Birth: 08-07-1930 Referring Provider (PT): Dr. [name]   Encounter Date: 02/15/2020   PT End of Session - 02/15/20 1531    Visit Number 2    Number of Visits 13    Date for PT Re-Evaluation 04/09/20    Authorization Type [employer]    Authorization Time Period 10th visit PN    PT Start Time 0246    PT Stop Time 0332    PT Time Calculation (min) 46 min    Equipment Utilized During Treatment Gait belt    Activity Tolerance Patient tolerated treatment well    Behavior During Therapy [facility] for tasks assessed/performed           Past Medical History:  Diagnosis Date  . Allergic rhinitis   . Arthritis   . Asthma   . Atypical chest pain   . Collapse of right lung   . Dyslipidemia   . Gastroesophageal reflux disease   . Glaucoma   . Hematuria   . Hyperlipidemia   . Lung nodules     Past Surgical History:  Procedure Laterality Date  . ANAL FISSURE REPAIR    . BREAST BIOPSY Right   . CATARACT EXTRACTION Bilateral   . MASS EXCISION  04/11/2012   Procedure: EXCISION MASS;  Surgeon: Wynonia Sours, MD;  Location: [facility];  Service: Orthopedics;  Laterality: Right;  Excision of mass right upper arm  . MEDIASTINOSCOPY  4/11  . NASAL SINUS SURGERY    . TOTAL ABDOMINAL HYSTERECTOMY W/ BILATERAL SALPINGOOPHORECTOMY      Vitals:   02/15/20 1523  BP: (!) 170/80     Subjective Assessment - 02/15/20 1448    Subjective Pt reports dizziness is a little bit better. Pt reports eyes feeling heavy. Pt reports ringing in L ear.    Pertinent History allergic rhinitis, OA, asthma, atypical chest pain, collapse of right lung, dyslipidemia, GERD, glaucoma, hematuria, HLD, lung nodules, R breast biopsy, bilat  cataract extraction, nasal sinus surgery    Patient Stated Goals resolve the vertigo and improve balance    Currently in Pain? No/denies              [facility] PT Assessment - 02/15/20 1450      Functional Gait  Assessment   Gait assessed  Yes    Gait Level Surface Walks 20 ft in less than 7 sec but greater than 5.5 sec, uses assistive device, slower speed, mild gait deviations, or deviates 6-10 in outside of the 12 in walkway width.    Change in Gait Speed Makes only minor adjustments to walking speed, or accomplishes a change in speed with significant gait deviations, deviates 10-15 in outside the 12 in walkway width, or changes speed but loses balance but is able to recover and continue walking.    Gait with Horizontal Head Turns Performs head turns with moderate changes in gait velocity, slows down, deviates 10-15 in outside 12 in walkway width but recovers, can continue to walk.    Gait with Vertical Head Turns Performs task with moderate change in gait velocity, slows down, deviates 10-15 in outside 12 in walkway width but recovers, can continue to walk.    Gait and Pivot Turn Turns slowly, requires verbal cueing, or requires several small steps  [facility] [address] [place], [place], [address] Phone: [phone]   Fax:  [phone]  Physical Therapy Treatment  Patient Details  Name: Anne [name] MRN: [id number] Date of Birth: 08-07-1930 Referring Provider (PT): Dr. [name]   Encounter Date: 02/15/2020   PT End of Session - 02/15/20 1531    Visit Number 2    Number of Visits 13    Date for PT Re-Evaluation 04/09/20    Authorization Type [employer]    Authorization Time Period 10th visit PN    PT Start Time 0246    PT Stop Time 0332    PT Time Calculation (min) 46 min    Equipment Utilized During Treatment Gait belt    Activity Tolerance Patient tolerated treatment well    Behavior During Therapy [facility] for tasks assessed/performed           Past Medical History:  Diagnosis Date  . Allergic rhinitis   . Arthritis   . Asthma   . Atypical chest pain   . Collapse of right lung   . Dyslipidemia   . Gastroesophageal reflux disease   . Glaucoma   . Hematuria   . Hyperlipidemia   . Lung nodules     Past Surgical History:  Procedure Laterality Date  . ANAL FISSURE REPAIR    . BREAST BIOPSY Right   . CATARACT EXTRACTION Bilateral   . MASS EXCISION  04/11/2012   Procedure: EXCISION MASS;  Surgeon: Wynonia Sours, MD;  Location: [facility];  Service: Orthopedics;  Laterality: Right;  Excision of mass right upper arm  . MEDIASTINOSCOPY  4/11  . NASAL SINUS SURGERY    . TOTAL ABDOMINAL HYSTERECTOMY W/ BILATERAL SALPINGOOPHORECTOMY      Vitals:   02/15/20 1523  BP: (!) 170/80     Subjective Assessment - 02/15/20 1448    Subjective Pt reports dizziness is a little bit better. Pt reports eyes feeling heavy. Pt reports ringing in L ear.    Pertinent History allergic rhinitis, OA, asthma, atypical chest pain, collapse of right lung, dyslipidemia, GERD, glaucoma, hematuria, HLD, lung nodules, R breast biopsy, bilat  cataract extraction, nasal sinus surgery    Patient Stated Goals resolve the vertigo and improve balance    Currently in Pain? No/denies              [facility] PT Assessment - 02/15/20 1450      Functional Gait  Assessment   Gait assessed  Yes    Gait Level Surface Walks 20 ft in less than 7 sec but greater than 5.5 sec, uses assistive device, slower speed, mild gait deviations, or deviates 6-10 in outside of the 12 in walkway width.    Change in Gait Speed Makes only minor adjustments to walking speed, or accomplishes a change in speed with significant gait deviations, deviates 10-15 in outside the 12 in walkway width, or changes speed but loses balance but is able to recover and continue walking.    Gait with Horizontal Head Turns Performs head turns with moderate changes in gait velocity, slows down, deviates 10-15 in outside 12 in walkway width but recovers, can continue to walk.    Gait with Vertical Head Turns Performs task with moderate change in gait velocity, slows down, deviates 10-15 in outside 12 in walkway width but recovers, can continue to walk.    Gait and Pivot Turn Turns slowly, requires verbal cueing, or requires several small steps  Overhead;Bed Mobility;Bend    [employer] Activity;Shop;Laundry;Meal Prep;Cleaning    Stability/Clinical Decision Making Stable/Uncomplicated    Rehab Potential Good    PT Frequency 2x / week    PT Duration 6 weeks    PT Treatment/Interventions ADLs/Self Care Home Management;Vestibular;Therapeutic activities;Therapeutic exercise;Balance training;Neuromuscular re-education;Canalith Repostioning;Gait training;Patient/family education    PT Next Visit Plan Continue VORx1 (trouble with vertical during first session) and begin balance training.  If pt continues to report dizziness sit > supine, investigate further (downbeating) with frenzels.    Consulted and Agree with Plan of Care Patient           Patient will benefit from skilled therapeutic intervention in order to improve the following deficits and impairments:  Dizziness, Decreased balance, Difficulty walking  Visit Diagnosis: Dizziness and giddiness  Unsteadiness on feet     Problem List Patient Active Problem List   Diagnosis Date Noted  . Arthritis 04/08/2018  . Glaucoma 04/08/2018  . Herpes simplex type 2 infection 04/08/2018  . Uterine leiomyoma 04/08/2018  .  Non-recurrent acute suppurative otitis media of left ear with spontaneous rupture of tympanic membrane 01/10/2018  . Pain 05/29/2017  . Throat pain in adult 05/28/2017  . Thrush of mouth and esophagus ([place]) 05/28/2017  . Acute bacterial sinusitis 10/10/2016  . De Quervain's syndrome (tenosynovitis) 01/25/2016  . Primary osteoarthritis of first carpometacarpal joint of right hand 01/25/2016  . Trigger finger of right thumb 01/25/2016  . Essential hypertension 12/19/2015  . Diastolic dysfunction 63/81/7711  . Dysphagia 01/04/2015  . Chest tightness 11/16/2014  . Dyspnea 11/15/2014  . COPD GOLD I 11/02/2014  . GERD (gastroesophageal reflux disease) 11/02/2014  . Proptosis 05/31/2014  . Glaucoma, bilateral 05/31/2014  . Ptosis of both eyelids 05/31/2014  . Xanthelasma of left upper eyelid 05/31/2014  . Pain in limb 08/18/2012  . Routine gynecological examination 07/24/2011  . Environmental allergies 07/24/2011  . Osteoporosis 07/24/2011  . Nocturia 07/24/2011  . Asthma 07/04/2007  . PULMONARY COLLAPSE 07/04/2007  . PULMONARY NODULE 07/04/2007    Rosalita Levan, SPT 02/15/2020, 4:27 PM  [facility] [address] [place], [place], [address] Phone: [phone]   Fax:  [phone]  Name: Stachia Slutsky MRN: [id number] Date of Birth: [birth year]  [facility] [address] [place], [place], [address] Phone: [phone]   Fax:  [phone]  Physical Therapy Treatment  Patient Details  Name: Anne [name] MRN: [id number] Date of Birth: 08-07-1930 Referring Provider (PT): Dr. [name]   Encounter Date: 02/15/2020   PT End of Session - 02/15/20 1531    Visit Number 2    Number of Visits 13    Date for PT Re-Evaluation 04/09/20    Authorization Type [employer]    Authorization Time Period 10th visit PN    PT Start Time 0246    PT Stop Time 0332    PT Time Calculation (min) 46 min    Equipment Utilized During Treatment Gait belt    Activity Tolerance Patient tolerated treatment well    Behavior During Therapy [facility] for tasks assessed/performed           Past Medical History:  Diagnosis Date  . Allergic rhinitis   . Arthritis   . Asthma   . Atypical chest pain   . Collapse of right lung   . Dyslipidemia   . Gastroesophageal reflux disease   . Glaucoma   . Hematuria   . Hyperlipidemia   . Lung nodules     Past Surgical History:  Procedure Laterality Date  . ANAL FISSURE REPAIR    . BREAST BIOPSY Right   . CATARACT EXTRACTION Bilateral   . MASS EXCISION  04/11/2012   Procedure: EXCISION MASS;  Surgeon: Wynonia Sours, MD;  Location: [facility];  Service: Orthopedics;  Laterality: Right;  Excision of mass right upper arm  . MEDIASTINOSCOPY  4/11  . NASAL SINUS SURGERY    . TOTAL ABDOMINAL HYSTERECTOMY W/ BILATERAL SALPINGOOPHORECTOMY      Vitals:   02/15/20 1523  BP: (!) 170/80     Subjective Assessment - 02/15/20 1448    Subjective Pt reports dizziness is a little bit better. Pt reports eyes feeling heavy. Pt reports ringing in L ear.    Pertinent History allergic rhinitis, OA, asthma, atypical chest pain, collapse of right lung, dyslipidemia, GERD, glaucoma, hematuria, HLD, lung nodules, R breast biopsy, bilat  cataract extraction, nasal sinus surgery    Patient Stated Goals resolve the vertigo and improve balance    Currently in Pain? No/denies              [facility] PT Assessment - 02/15/20 1450      Functional Gait  Assessment   Gait assessed  Yes    Gait Level Surface Walks 20 ft in less than 7 sec but greater than 5.5 sec, uses assistive device, slower speed, mild gait deviations, or deviates 6-10 in outside of the 12 in walkway width.    Change in Gait Speed Makes only minor adjustments to walking speed, or accomplishes a change in speed with significant gait deviations, deviates 10-15 in outside the 12 in walkway width, or changes speed but loses balance but is able to recover and continue walking.    Gait with Horizontal Head Turns Performs head turns with moderate changes in gait velocity, slows down, deviates 10-15 in outside 12 in walkway width but recovers, can continue to walk.    Gait with Vertical Head Turns Performs task with moderate change in gait velocity, slows down, deviates 10-15 in outside 12 in walkway width but recovers, can continue to walk.    Gait and Pivot Turn Turns slowly, requires verbal cueing, or requires several small steps

## 2020-02-17 DIAGNOSIS — S93491A Sprain of other ligament of right ankle, initial encounter: Secondary | ICD-10-CM | POA: Diagnosis not present

## 2020-02-19 ENCOUNTER — Other Ambulatory Visit: Payer: Self-pay

## 2020-02-19 ENCOUNTER — Encounter: Payer: Self-pay | Admitting: Physical Therapy

## 2020-02-19 ENCOUNTER — Ambulatory Visit: Payer: Medicare PPO | Admitting: Physical Therapy

## 2020-02-19 DIAGNOSIS — R2681 Unsteadiness on feet: Secondary | ICD-10-CM

## 2020-02-19 DIAGNOSIS — R42 Dizziness and giddiness: Secondary | ICD-10-CM | POA: Diagnosis not present

## 2020-02-19 DIAGNOSIS — R262 Difficulty in walking, not elsewhere classified: Secondary | ICD-10-CM

## 2020-02-19 NOTE — Therapy (Signed)
[facility] [address] [place], [place], [address] Phone: [phone]   Fax:  [phone]  Physical Therapy Treatment  Patient Details  Name: Anne [name] MRN: [id number] Date of Birth: [birth year] Referring Provider (PT): Dr. Merlene Laughter   Encounter Date: 02/19/2020   PT End of Session - 02/19/20 1114    Visit Number 3    Number of Visits 13    Date for PT Re-Evaluation 04/09/20    Authorization Type [employer]    Authorization Time Period 10th visit PN    PT Start Time 0852    PT Stop Time 0933    PT Time Calculation (min) 41 min    Activity Tolerance Patient tolerated treatment well    Behavior During Therapy WFL for tasks assessed/performed           Past Medical History:  Diagnosis Date   Allergic rhinitis    Arthritis    Asthma    Atypical chest pain    Collapse of right lung    Dyslipidemia    Gastroesophageal reflux disease    Glaucoma    Hematuria    Hyperlipidemia    Lung nodules     Past Surgical History:  Procedure Laterality Date   ANAL FISSURE REPAIR     BREAST BIOPSY Right    CATARACT EXTRACTION Bilateral    MASS EXCISION  04/11/2012   Procedure: EXCISION MASS;  Surgeon: Nicki Reaper, MD;  Location: [facility];  Service: Orthopedics;  Laterality: Right;  Excision of mass right upper arm   MEDIASTINOSCOPY  4/11   NASAL SINUS SURGERY     TOTAL ABDOMINAL HYSTERECTOMY W/ BILATERAL SALPINGOOPHORECTOMY      There were no vitals filed for this visit.   Subjective Assessment - 02/19/20 0855    Subjective Went to the [facility] yesterday, developed a HA afterwards.  Has been trying the letter exercise but feels like she is doing it incorrectly. Eye heaviness is better.    Pertinent History allergic rhinitis, OA, asthma, atypical chest pain, collapse of right lung, dyslipidemia, GERD, glaucoma, hematuria, HLD, lung nodules, R  breast biopsy, bilat cataract extraction, nasal sinus surgery    Patient Stated Goals resolve the vertigo and improve balance    Currently in Pain? No/denies                             [facility] Adult PT Treatment/Exercise - 02/19/20 1112      Exercises   Exercises Neck      Neck Exercises: Stretches   Upper Trapezius Stretch Right;Left;1 rep;30 seconds    Levator Stretch Right;Left;1 rep;30 seconds    Other Neck Stretches SCM stretch on L and R; 1 rep, 30 seconds.  Seated posterior shoulder rolls x 5 reps; advised pt to perform neck stretches prior to gaze stabilization at home           Vestibular Treatment/Exercise - 02/19/20 0923      Vestibular Treatment/Exercise   Vestibular Treatment Provided Habituation    Habituation Exercises Standing Vertical Head Turns;Standing Diagonal Head Turns;Seated Vertical Head Turns    Gaze Exercises X1 Viewing Horizontal;X1 Viewing Vertical      Seated Vertical Head Turns   Number of Reps  3    Symptom Description  3 reps to each side; seated reaching down and retrieving or placing cone on ground.  Retrieved and placed on L  and R side; no symptoms      Standing Vertical Head Turns   Number of Reps  3    Symptom Description  Standing reaching to floor to R and L for cone (pick up and place on floor on other side), no symptoms.  Transitioned to cabinet and performed reaching floor <> high cabinet (placing and retrieving cone) x 3-5 reps with no symptoms      Standing Diagonal Head Turns   Number of Reps  3    Symptiom Description  Standing with cabinet to R side and then L side performing 3-5 reps diagonal reaching floor <> high cabinet (placing and retrieving cone).  No symptoms of dizziness      X1 Viewing Horizontal   Foot Position standing feet apart on solid surface    Reps 2    Comments 30 seconds > 45 seconds, improved technique, mild symptoms      X1 Viewing Vertical   Foot Position standing feet apart, solid  surface    Reps 2    Comments 30 seconds with cues to perform slow and smooth to minimize extra body movement              Balance Exercises - 02/19/20 0924      Balance Exercises: Standing   Standing Eyes Opened Narrow base of support (BOS);Head turns;Solid surface;Other reps (comment);Limitations    Standing Eyes Opened Limitations 10 reps diagonal head turns in each direction; mild dizziness/heaviness             PT Education - 02/19/20 1114    Education Details updated and reviewed HEP    Person(s) Educated Patient    Methods Explanation;Demonstration;Handout    Comprehension Verbalized understanding;Returned demonstration            PT Short Term Goals - 02/15/20 1614      PT SHORT TERM GOAL #1   Title Pt will increased FGA score to 19/30 for clinical detectable change in dynamic balance.    Baseline 15/30 02/15/20    Time 3    Period Weeks    Status New    Target Date 03/01/20      PT SHORT TERM GOAL #2   Title Pt will amb 250' independently without veering.    Baseline NT    Time 3    Period Weeks    Status New    Target Date 03/01/20      PT SHORT TERM GOAL #3   Title Pt will demonstrate independence with initial vestibular/balance HEP    Time 3    Period Weeks    Status New    Target Date 03/01/20      PT SHORT TERM GOAL #4   Title Pt will be able to obtain </= 2 line difference on DVA.    Baseline 3 line difference 02/15/20    Time 3    Period Weeks    Status New    Target Date 03/07/20             PT Long Term Goals - 02/15/20 1616      PT LONG TERM GOAL #1   Title Pt will increase FGA score to >/= 23/30 to indicate minimal detectable change in dynamic balance. (ALL LTGs due 03/25/20)    Baseline 15/30 02/15/20    Time 6    Period Weeks    Status Revised    Target Date 03/25/20      PT LONG TERM GOAL #2  Title Independent in HEP for balance and vestibular exercises.    Baseline NA    Time 6    Period Weeks    Status New     Target Date 03/25/20      PT LONG TERM GOAL #3   Title Pt will be WNL with DVA testing.    Baseline 3 line difference 02/15/20    Time 6    Period Weeks    Status New    Target Date 03/25/20      PT LONG TERM GOAL #4   Title Pt will have a (-) bilateral head impulse test.    Baseline (+) HIT bil 02/09/20    Time 6    Period Weeks    Status New      PT LONG TERM GOAL #5   Title Pt will increase FOTO score to 77% and will decrease DHI to </= 16    Baseline 50%; DHI: 26    Time 6    Period Weeks    Status New                 Plan - 02/19/20 1114    Clinical Impression Statement Reviewed HEP due to pt reporting confusion about how to perform x1 viewing; pt demonstrated progress with horizontal head movements but continues to require cues for vertical.  Continued to address motion sensitivity with bending down to the floor and reaching up high; when performing various habituation activities pt did not report any dizziness; when performing during static standing with narrow BOS pt reported mild symptoms-added to HEP along with neck stretches to prepare for habituation and adaptation training.  Pt continues to report symptoms with bed mobility; will re-assess next session.    Personal Factors and Comorbidities Age;Comorbidity 3+;Past/Current Experience;Social Background    Comorbidities allergic rhinitis, OA, asthma, atypical chest pain, collapse of right lung, dyslipidemia, GERD, glaucoma, hematuria, HLD, lung nodules, R breast biopsy, bilat cataract extraction, nasal sinus surgery    Examination-Activity Limitations Locomotion Level;Reach Overhead;Bed Mobility;Bend    [employer] Activity;Shop;Laundry;Meal Prep;Cleaning    Stability/Clinical Decision Making Stable/Uncomplicated    Rehab Potential Good    PT Frequency 2x / week    PT Duration 6 weeks    PT Treatment/Interventions ADLs/Self Care Home Management;Vestibular;Therapeutic  activities;Therapeutic exercise;Balance training;Neuromuscular re-education;Canalith Repostioning;Gait training;Patient/family education    PT Next Visit Plan How is HEP?  If pt continues to report dizziness sit > supine, investigate further (downbeating) with frenzels.    Consulted and Agree with Plan of Care Patient           Patient will benefit from skilled therapeutic intervention in order to improve the following deficits and impairments:  Dizziness, Decreased balance, Difficulty walking  Visit Diagnosis: Dizziness and giddiness  Unsteadiness on feet  Difficulty in walking, not elsewhere classified     Problem List Patient Active Problem List   Diagnosis Date Noted   Arthritis 04/08/2018   Glaucoma 04/08/2018   Herpes simplex type 2 infection 04/08/2018   Uterine leiomyoma 04/08/2018   Non-recurrent acute suppurative otitis media of left ear with spontaneous rupture of tympanic membrane 01/10/2018   Pain 05/29/2017   Throat pain in adult 05/28/2017   Thrush of mouth and esophagus ([facility]) 05/28/2017   Acute bacterial sinusitis 10/10/2016   De Quervain's syndrome (tenosynovitis) 01/25/2016   Primary osteoarthritis of first carpometacarpal joint of right hand 01/25/2016   Trigger finger of right thumb 01/25/2016   Essential hypertension 12/19/2015   Diastolic  dysfunction 10/26/2015   Dysphagia 01/04/2015   Chest tightness 11/16/2014   Dyspnea 11/15/2014   COPD GOLD I 11/02/2014   GERD (gastroesophageal reflux disease) 11/02/2014   Proptosis 05/31/2014   Glaucoma, bilateral 05/31/2014   Ptosis of both eyelids 05/31/2014   Xanthelasma of left upper eyelid 05/31/2014   Pain in limb 08/18/2012   Routine gynecological examination 07/24/2011   Environmental allergies 07/24/2011   Osteoporosis 07/24/2011   Nocturia 07/24/2011   Asthma 07/04/2007   PULMONARY COLLAPSE 07/04/2007   PULMONARY NODULE 07/04/2007   Dierdre Highman, PT,  DPT 02/19/20    11:19 AM    [facility] [address] [place], [place], [address] Phone: [phone]   Fax:  [phone]  Name: Tahje Walkowski MRN: [id number] Date of Birth: 1930-04-24

## 2020-02-19 NOTE — Patient Instructions (Addendum)
Flexibility: Upper Trapezius Stretch    Gently Tilt left ear over towards left shoulder until a gentle stretch is felt on the right side of the neck.  Try to keep your body from tilting.  Try to keep the right shoulder down. Hold __30__ seconds. Repeat _2___ times per set.    Levator Stretch    Turn head toward the LEFT side and look down at your arm pit until you feel a stretch on the right side. Hold __30__ seconds.  Repeat ___2_ times.     Shoulder Rolls   Movement Move your shoulders upward, backward, down and then forwards in a smooth circular motion. Repeat 5 times, continuing to move your shoulders in a circular motion.   Gaze Stabilization - Tip Card  1.Target must remain in focus, not blurry, and appear stationary while head is in motion. 2.Perform exercises with small head movements (45 to either side of midline). 3.Increase speed of head motion so long as target is in focus. 4.If you wear eyeglasses, be sure you can see target through lens (therapist will give specific instructions for bifocal / progressive lenses). 5.These exercises may provoke dizziness or nausea. Work through these symptoms. If too dizzy, slow head movement slightly. Rest between each exercise. 6.Exercises demand concentration; avoid distractions. 7.For safety, perform standing exercises close to a counter, wall, corner, or next to someone.  Copyright  [employer]. All rights reserved.   Gaze Stabilization - Standing Feet Apart   Feet shoulder width apart, keeping eyes on target on wall 3 feet away, tilt head down slightly and move head side to side for 45 seconds, try to keep the shoulders still. Then perform head nods up and down, SLOW AND SMOOTH, for 30 seconds.  Do 2-3 sessions per day.   Feet Together, Head Motion - Eyes Open    Standing in a Corner.  With eyes open, feet together, move head slowly in diagonal patterns.  Up/Left to Down/Right 10 times.  Up/Right to Down/Left 10 times.

## 2020-02-22 ENCOUNTER — Encounter: Payer: Self-pay | Admitting: Physical Therapy

## 2020-02-22 ENCOUNTER — Other Ambulatory Visit: Payer: Self-pay

## 2020-02-22 ENCOUNTER — Ambulatory Visit: Payer: Medicare PPO | Admitting: Physical Therapy

## 2020-02-22 DIAGNOSIS — R262 Difficulty in walking, not elsewhere classified: Secondary | ICD-10-CM | POA: Diagnosis not present

## 2020-02-22 DIAGNOSIS — R2681 Unsteadiness on feet: Secondary | ICD-10-CM | POA: Diagnosis not present

## 2020-02-22 DIAGNOSIS — R42 Dizziness and giddiness: Secondary | ICD-10-CM | POA: Diagnosis not present

## 2020-02-22 NOTE — Therapy (Signed)
Environmental allergies 07/24/2011  . Osteoporosis 07/24/2011  . Nocturia 07/24/2011  . Asthma 07/04/2007  . PULMONARY COLLAPSE 07/04/2007  . PULMONARY NODULE 07/04/2007    Rosalita Levan, SPT 02/22/2020, 4:07 PM  [facility] [address] [place], [place], [address] Phone: [phone]   Fax:  [phone]  Name: Anne [name] MRN: [id number] Date of Birth: June 24, 1930  [facility] [address] [place], [place], [address] Phone: [phone]   Fax:  [phone]  Physical Therapy Treatment  Patient Details  Name: Anne [name] MRN: [id number] Date of Birth: Aug 02, 1930 Referring Provider (PT): Dr. [name]   Encounter Date: 02/22/2020   PT End of Session - 02/22/20 1557    Visit Number 4    Number of Visits 13    Date for PT Re-Evaluation 03/25/20    Authorization Type [employer] - Approved for 12 visits from 02/09/20 > 03/25/20    Authorization Time Period 10th visit PN    PT Start Time 1448    PT Stop Time 1530    PT Time Calculation (min) 42 min    Activity Tolerance Patient tolerated treatment well    Behavior During Therapy [facility] for tasks assessed/performed           Past Medical History:  Diagnosis Date  . Allergic rhinitis   . Arthritis   . Asthma   . Atypical chest pain   . Collapse of right lung   . Dyslipidemia   . Gastroesophageal reflux disease   . Glaucoma   . Hematuria   . Hyperlipidemia   . Lung nodules     Past Surgical History:  Procedure Laterality Date  . ANAL FISSURE REPAIR    . BREAST BIOPSY Right   . CATARACT EXTRACTION Bilateral   . MASS EXCISION  04/11/2012   Procedure: EXCISION MASS;  Surgeon: Wynonia Sours, MD;  Location: [facility];  Service: Orthopedics;  Laterality: Right;  Excision of mass right upper arm  . MEDIASTINOSCOPY  4/11  . NASAL SINUS SURGERY    . TOTAL ABDOMINAL HYSTERECTOMY W/ BILATERAL SALPINGOOPHORECTOMY      There were no vitals filed for this visit.   Subjective Assessment - 02/22/20 1451    Subjective Pt reports that when she went to put her head back in the sink to be washed at the hair salon, she became very dizzy with the room spinning. Dizziness lingered all weekend.    Pertinent History allergic rhinitis, OA, asthma, atypical chest pain, collapse of right lung, dyslipidemia, GERD,  glaucoma, hematuria, HLD, lung nodules, R breast biopsy, bilat cataract extraction, nasal sinus surgery    Patient Stated Goals resolve the vertigo and improve balance    Currently in Pain? No/denies   head feels heavy                  Vestibular Assessment - 02/22/20 1452      Oculomotor Exam-Fixation Suppressed    Spontaneous Nystagmus absent    Gaze evoked nystagmus absent    Head Shaking Nystagmus-Horizontal negative      Positional Testing   Dix-Hallpike Dix-Hallpike Left;Dix-Hallpike Right    Horizontal Canal Testing Horizontal Canal Right;Horizontal Canal Left      Dix-Hallpike Right   Dix-Hallpike Right Symptoms No nystagmus   with frenzel lenses     Dix-Hallpike Left   Dix-Hallpike Left Symptoms No nystagmus   with frenzel lenses     Sidelying Right   Sidelying Right Symptoms No nystagmus   with frenzel lenses     Sidelying Left   Sidelying Left Symptoms No nystagmus   with frenzel lenses     Positional Sensitivities   Sit to Supine Mild dizziness    Supine to Sitting Lightheadedness          Performed positional testing, gaze holding nystagmus and head shaking  Environmental allergies 07/24/2011  . Osteoporosis 07/24/2011  . Nocturia 07/24/2011  . Asthma 07/04/2007  . PULMONARY COLLAPSE 07/04/2007  . PULMONARY NODULE 07/04/2007    Rosalita Levan, SPT 02/22/2020, 4:07 PM  [facility] [address] [place], [place], [address] Phone: [phone]   Fax:  [phone]  Name: Anne [name] MRN: [id number] Date of Birth: June 24, 1930  Environmental allergies 07/24/2011  . Osteoporosis 07/24/2011  . Nocturia 07/24/2011  . Asthma 07/04/2007  . PULMONARY COLLAPSE 07/04/2007  . PULMONARY NODULE 07/04/2007    Rosalita Levan, SPT 02/22/2020, 4:07 PM  [facility] [address] [place], [place], [address] Phone: [phone]   Fax:  [phone]  Name: Anne [name] MRN: [id number] Date of Birth: June 24, 1930

## 2020-02-22 NOTE — Patient Instructions (Addendum)
Flexibility: Upper Trapezius Stretch    Gently Tilt left ear over towards left shoulder until a gentle stretch is felt on the right side of the neck.  Try to keep your body from tilting.  Try to keep the right shoulder down. Hold __30__ seconds. Repeat _2___ times per set.    Levator Stretch    Turn head toward the LEFT side and look down at your arm pit until you feel a stretch on the right side. Hold __30__ seconds.  Repeat ___2_ times.     Shoulder Rolls   Movement Move your shoulders upward, backward, down and then forwards in a smooth circular motion. Repeat 5 times, continuing to move your shoulders in a circular motion.   Gaze Stabilization - Tip Card  1.Target must remain in focus, not blurry, and appear stationary while head is in motion. 2.Perform exercises with small head movements (45 to either side of midline). 3.Increase speed of head motion so long as target is in focus. 4.If you wear eyeglasses, be sure you can see target through lens (therapist will give specific instructions for bifocal / progressive lenses). 5.These exercises may provoke dizziness or nausea. Work through these symptoms. If too dizzy, slow head movement slightly. Rest between each exercise. 6.Exercises demand concentration; avoid distractions. 7.For safety, perform standing exercises close to a counter, wall, corner, or next to someone.  Copyright  VHI. All rights reserved.   Gaze Stabilization - Standing Feet Apart   Feet shoulder width apart, keeping eyes on target on wall 3 feet away, tilt head down slightly and move head side to side for 45 seconds, try to keep the shoulders still. Then perform head nods up and down, SLOW AND SMOOTH, for 30 seconds.  Do 2-3 sessions per day.   Feet Together, Head Motion - Eyes Open    Standing in a Corner.  With eyes open, feet together, move head slowly in diagonal patterns.  Up/Left to Down/Right 10 times.  Up/Right to Down/Left 10  times.  Added:  Repeated sit<>supine on 2 pillows x5 reps with 20 second hold in each position.

## 2020-02-29 ENCOUNTER — Other Ambulatory Visit: Payer: Self-pay

## 2020-02-29 ENCOUNTER — Ambulatory Visit: Payer: Medicare PPO | Admitting: Physical Therapy

## 2020-02-29 ENCOUNTER — Encounter: Payer: Self-pay | Admitting: Physical Therapy

## 2020-02-29 DIAGNOSIS — R262 Difficulty in walking, not elsewhere classified: Secondary | ICD-10-CM

## 2020-02-29 DIAGNOSIS — R2681 Unsteadiness on feet: Secondary | ICD-10-CM

## 2020-02-29 DIAGNOSIS — R42 Dizziness and giddiness: Secondary | ICD-10-CM | POA: Diagnosis not present

## 2020-02-29 NOTE — Therapy (Signed)
Name: Anne [name] MRN: [id number] Date of Birth: 08/08/1930  [facility] [address] [place], [place], [address] Phone: [phone]   Fax:  [phone]  Physical Therapy Treatment  Patient Details  Name: [name] MRN: [id number] Date of Birth: 31-Oct-1930 Referring Provider (PT): Dr. [name]   Encounter Date: 02/29/2020   PT End of Session - 02/29/20 1530    Visit Number 5    Number of Visits 13    Date for PT Re-Evaluation 03/25/20    Authorization Type [employer] - Approved for 12 visits from 02/09/20 > 03/25/20    Authorization Time Period 10th visit PN    PT Start Time 1448    PT Stop Time 1532    PT Time Calculation (min) 44 min    Equipment Utilized During Treatment Gait belt    Activity Tolerance Patient tolerated treatment well    Behavior During Therapy [facility] for tasks assessed/performed           Past Medical History:  Diagnosis Date  . Allergic rhinitis   . Arthritis   . Asthma   . Atypical chest pain   . Collapse of right lung   . Dyslipidemia   . Gastroesophageal reflux disease   . Glaucoma   . Hematuria   . Hyperlipidemia   . Lung nodules     Past Surgical History:  Procedure Laterality Date  . ANAL FISSURE REPAIR    . BREAST BIOPSY Right   . CATARACT EXTRACTION Bilateral   . MASS EXCISION  04/11/2012   Procedure: EXCISION MASS;  Surgeon: Wynonia Sours, MD;  Location: [facility];  Service: Orthopedics;  Laterality: Right;  Excision of mass right upper arm  . MEDIASTINOSCOPY  4/11  . NASAL SINUS SURGERY    . TOTAL ABDOMINAL HYSTERECTOMY W/ BILATERAL SALPINGOOPHORECTOMY      There were no vitals filed for this visit.   Subjective Assessment - 02/29/20 1451    Subjective Pt had a wonderful thanksgiving. Pt woke up feeling great and started moving too fast and got a headache. Pt has an appt with ENT on Dec 6th.    Pertinent History allergic rhinitis, OA, asthma, atypical chest pain, collapse of right lung,  dyslipidemia, GERD, glaucoma, hematuria, HLD, lung nodules, R breast biopsy, bilat cataract extraction, nasal sinus surgery    Patient Stated Goals resolve the vertigo and improve balance    Currently in Pain? No/denies               02/29/20 1458  Functional Gait  Assessment  Gait assessed  Yes  Gait Level Surface 3  Change in Gait Speed 2  Gait with Horizontal Head Turns 1  Gait with Vertical Head Turns 1  Gait and Pivot Turn 2  Step Over Obstacle 3  Gait with Narrow Base of Support 2  Gait with Eyes Closed 2  Ambulating Backwards 1  Steps 3  Total Score 20  FGA comment: 20/30 indicating moderate fall risk          Vestibular Assessment - 02/29/20 1515      Visual Acuity   Static line 7    Dynamic line 4                    [facility] Adult PT Treatment/Exercise - 02/29/20 1453      Ambulation/Gait   Ambulation/Gait Yes    Ambulation/Gait Assistance 7: Independent    Ambulation Distance (Feet) 345 Feet    Assistive device None  Name: Anne [name] MRN: [id number] Date of Birth: 08/08/1930  Name: Anne [name] MRN: [id number] Date of Birth: 08/08/1930

## 2020-02-29 NOTE — Patient Instructions (Addendum)
Flexibility: Upper Trapezius Stretch    Gently Tilt left ear over towards left shoulder until a gentle stretch is felt on the right side of the neck.  Try to keep your body from tilting.  Try to keep the right shoulder down. Hold __30__ seconds. Repeat _2___ times per set.    Levator Stretch    Turn head toward the LEFT side and look down at your arm pit until you feel a stretch on the right side. Hold __30__ seconds.  Repeat ___2_ times.     Shoulder Rolls   Movement Move your shoulders upward, backward, down and then forwards in a smooth circular motion. Repeat 5 times, continuing to move your shoulders in a circular motion.   Gaze Stabilization - Tip Card  1.Target must remain in focus, not blurry, and appear stationary while head is in motion. 2.Perform exercises with small head movements (45 to either side of midline). 3.Increase speed of head motion so long as target is in focus. 4.If you wear eyeglasses, be sure you can see target through lens (therapist will give specific instructions for bifocal / progressive lenses). 5.These exercises may provoke dizziness or nausea. Work through these symptoms. If too dizzy, slow head movement slightly. Rest between each exercise. 6.Exercises demand concentration; avoid distractions. 7.For safety, perform standing exercises close to a counter, wall, corner, or next to someone.  Copyright  VHI. All rights reserved.   Gaze Stabilization - Standing Feet Apart   Feet shoulder width apart, keeping eyes on target on wall 3 feet away, tilt head down slightly and move head side to side for 45 seconds, try to keep the shoulders still. Then perform head nods up and down, SLOW AND SMOOTH, for 30 seconds.  Do 2-3 sessions per day.    Feet Together (Compliant Surface) Head Motion - Eyes Open    With eyes open, standing on compliant surface: Pillow, feet together, move head slowly: left and right/side to side for 30 seconds. Repeat 2  times per session. Do 1 sessions per day.     Repeated sit<>supine on 2 pillows x5 reps with 20 second hold in each position.

## 2020-03-03 ENCOUNTER — Ambulatory Visit: Payer: Medicare PPO | Admitting: Physical Therapy

## 2020-03-04 ENCOUNTER — Ambulatory Visit: Payer: Medicare PPO | Admitting: Physical Therapy

## 2020-03-06 ENCOUNTER — Other Ambulatory Visit: Payer: Self-pay | Admitting: Internal Medicine

## 2020-03-08 DIAGNOSIS — H8111 Benign paroxysmal vertigo, right ear: Secondary | ICD-10-CM | POA: Diagnosis not present

## 2020-03-08 DIAGNOSIS — R519 Headache, unspecified: Secondary | ICD-10-CM | POA: Diagnosis not present

## 2020-03-08 DIAGNOSIS — H9113 Presbycusis, bilateral: Secondary | ICD-10-CM | POA: Diagnosis not present

## 2020-03-10 ENCOUNTER — Ambulatory Visit: Payer: Medicare PPO | Attending: Geriatric Medicine | Admitting: Physical Therapy

## 2020-03-10 ENCOUNTER — Other Ambulatory Visit: Payer: Self-pay

## 2020-03-10 ENCOUNTER — Encounter: Payer: Self-pay | Admitting: Physical Therapy

## 2020-03-10 ENCOUNTER — Encounter: Payer: Medicare PPO | Admitting: Physical Therapy

## 2020-03-10 DIAGNOSIS — R262 Difficulty in walking, not elsewhere classified: Secondary | ICD-10-CM | POA: Insufficient documentation

## 2020-03-10 DIAGNOSIS — R42 Dizziness and giddiness: Secondary | ICD-10-CM | POA: Insufficient documentation

## 2020-03-10 DIAGNOSIS — R2681 Unsteadiness on feet: Secondary | ICD-10-CM | POA: Insufficient documentation

## 2020-03-10 DIAGNOSIS — H8111 Benign paroxysmal vertigo, right ear: Secondary | ICD-10-CM | POA: Diagnosis not present

## 2020-03-10 NOTE — Patient Instructions (Signed)
Rolling    With pillow under head, start on back. Roll slowly to right. Hold position until symptoms subside. Roll slowly onto left side. Hold position until symptoms subside. Repeat sequence _5___ times per session. Do _3-5___ sessions per day.  Copyright  VHI. All rights reserved.

## 2020-03-10 NOTE — Therapy (Signed)
Epley maneuver for Rt BPPV was performed 2 reps with some slight Rt horizontal nystagmus noted in initial position of 2nd rep of Epley maneuver.   Pt requested to demonstrate the exercises she was doing at home for self-treatment/habituation and when pt lied supine from long-sitting position she had severe Rt horizontal nystagmus.   Nystagmus was noted to be Rt horizontal geotropic, indicative of Rt horizontal canalithiasis.  Pt was treated with 1 rep of Bar-B-Que for Rt horizontal canal BPPV.  Pt was instructed in repetitive rolling for HEP.  Will cont. to assess at next session.    Personal Factors and Comorbidities Age;Comorbidity 3+;Past/Current Experience;Social Background    Comorbidities allergic rhinitis, OA, asthma, atypical chest pain, collapse of right lung, dyslipidemia, GERD, glaucoma, hematuria, HLD, lung nodules, R breast biopsy, bilat cataract extraction, nasal sinus surgery    Examination-Activity Limitations Locomotion Level;Reach Overhead;Bed Mobility;Bend    [employer] Activity;Shop;Laundry;Meal Prep;Cleaning    Stability/Clinical Decision Making Stable/Uncomplicated    Rehab Potential Good    PT Frequency 2x / week    PT Duration 6 weeks    PT Treatment/Interventions ADLs/Self Care Home Management;Vestibular;Therapeutic activities;Therapeutic exercise;Balance training;Neuromuscular re-education;Canalith Repostioning;Gait training;Patient/family education    PT Next Visit Plan Recheck Rt BPPV - (horizontal canal > posterior canal) Progress VOR and diagonal head turns to make it more challenging. Add compliant surfaces and tandem. Incorporate more gait training with head turns.    Consulted and Agree with Plan of Care Patient           Patient will benefit from skilled therapeutic intervention in order to improve the following deficits and impairments:  Dizziness,Decreased balance,Difficulty walking  Visit Diagnosis: BPPV (benign paroxysmal positional vertigo), right     Problem List Patient Active Problem List   Diagnosis Date Noted  . Arthritis 04/08/2018  . Glaucoma 04/08/2018  . Herpes simplex type 2 infection 04/08/2018  . Uterine leiomyoma 04/08/2018  . Non-recurrent acute suppurative otitis media of left ear with spontaneous rupture of tympanic  membrane 01/10/2018  . Pain 05/29/2017  . Throat pain in adult 05/28/2017  . Thrush of mouth and esophagus ([place]) 05/28/2017  . Acute bacterial sinusitis 10/10/2016  . De Quervain's syndrome (tenosynovitis) 01/25/2016  . Primary osteoarthritis of first carpometacarpal joint of right hand 01/25/2016  . Trigger finger of right thumb 01/25/2016  . Essential hypertension 12/19/2015  . Diastolic dysfunction 96/07/5407  . Dysphagia 01/04/2015  . Chest tightness 11/16/2014  . Dyspnea 11/15/2014  . COPD GOLD I 11/02/2014  . GERD (gastroesophageal reflux disease) 11/02/2014  . Proptosis 05/31/2014  . Glaucoma, bilateral 05/31/2014  . Ptosis of both eyelids 05/31/2014  . Xanthelasma of left upper eyelid 05/31/2014  . Pain in limb 08/18/2012  . Routine gynecological examination 07/24/2011  . Environmental allergies 07/24/2011  . Osteoporosis 07/24/2011  . Nocturia 07/24/2011  . Asthma 07/04/2007  . PULMONARY COLLAPSE 07/04/2007  . PULMONARY NODULE 07/04/2007    DildayJenness Corner, PT 03/10/2020, 5:12 PM  [facility] [address] [place], [place], [address] Phone: [phone]   Fax:  [phone]  Name: Anne [name] MRN: [id number] Date of Birth: 1931/03/24  [facility] [address] [place], [place], [address] Phone: [phone]   Fax:  [phone]  Physical Therapy Treatment  Patient Details  Name: Anne [name] MRN: [id number] Date of Birth: [birth year] Referring Provider (PT): Dr. [name]   Encounter Date: 03/10/2020   PT End of Session - 03/10/20 1703    Visit Number 6    Number of Visits 13    Date for PT Re-Evaluation 03/25/20    Authorization Type [employer] - Approved for 12 visits from 02/09/20 > 03/25/20    Authorization Time Period 10th visit PN    PT Start Time 1400    PT Stop Time 1445    PT Time Calculation (min) 45 min    Activity Tolerance Patient tolerated treatment well    Behavior During Therapy [facility] for tasks assessed/performed           Past Medical History:  Diagnosis Date  . Allergic rhinitis   . Arthritis   . Asthma   . Atypical chest pain   . Collapse of right lung   . Dyslipidemia   . Gastroesophageal reflux disease   . Glaucoma   . Hematuria   . Hyperlipidemia   . Lung nodules     Past Surgical History:  Procedure Laterality Date  . ANAL FISSURE REPAIR    . BREAST BIOPSY Right   . CATARACT EXTRACTION Bilateral   . MASS EXCISION  04/11/2012   Procedure: EXCISION MASS;  Surgeon: Wynonia Sours, MD;  Location: [facility];  Service: Orthopedics;  Laterality: Right;  Excision of mass right upper arm  . MEDIASTINOSCOPY  4/11  . NASAL SINUS SURGERY    . TOTAL ABDOMINAL HYSTERECTOMY W/ BILATERAL SALPINGOOPHORECTOMY      There were no vitals filed for this visit.   Subjective Assessment - 03/10/20 1405    Subjective Pt states she saw Dr. Constance Holster on Tuesday - said he suggested to do the Epley as he states she may still have some BPPV; reports she has occasional headaches, but states the headaches are improved at end of day    Pertinent History allergic rhinitis, OA, asthma, atypical chest pain, collapse of  right lung, dyslipidemia, GERD, glaucoma, hematuria, HLD, lung nodules, R breast biopsy, bilat cataract extraction, nasal sinus surgery    Patient Stated Goals resolve the vertigo and improve balance    Currently in Pain? No/denies                              Vestibular Treatment/Exercise - 03/10/20 0001      Vestibular Treatment/Exercise   Vestibular Treatment Provided Canalith Repositioning    Canalith Repositioning Epley Manuever Right;Canal Roll Right    Habituation Exercises Horizontal Roll       EPLEY MANUEVER RIGHT   Number of Reps  2    Overall Response Symptoms Worsened    Response Details  Rt upbeating nystagmus on 1st rep of Epley; noted mild Rt beating horizontal nystagmus on 2nd rep of Epley in initial position      Canal Roll Right   Number of Reps  1    Overall Response  No change    Response Details  severe Rt horizontal nystagmus lasting approx. 20 secs with head rotated to Rt side      Horizontal Roll   Number of Reps  3    Symptom Description  pt continued to have  Epley maneuver for Rt BPPV was performed 2 reps with some slight Rt horizontal nystagmus noted in initial position of 2nd rep of Epley maneuver.   Pt requested to demonstrate the exercises she was doing at home for self-treatment/habituation and when pt lied supine from long-sitting position she had severe Rt horizontal nystagmus.   Nystagmus was noted to be Rt horizontal geotropic, indicative of Rt horizontal canalithiasis.  Pt was treated with 1 rep of Bar-B-Que for Rt horizontal canal BPPV.  Pt was instructed in repetitive rolling for HEP.  Will cont. to assess at next session.    Personal Factors and Comorbidities Age;Comorbidity 3+;Past/Current Experience;Social Background    Comorbidities allergic rhinitis, OA, asthma, atypical chest pain, collapse of right lung, dyslipidemia, GERD, glaucoma, hematuria, HLD, lung nodules, R breast biopsy, bilat cataract extraction, nasal sinus surgery    Examination-Activity Limitations Locomotion Level;Reach Overhead;Bed Mobility;Bend    [employer] Activity;Shop;Laundry;Meal Prep;Cleaning    Stability/Clinical Decision Making Stable/Uncomplicated    Rehab Potential Good    PT Frequency 2x / week    PT Duration 6 weeks    PT Treatment/Interventions ADLs/Self Care Home Management;Vestibular;Therapeutic activities;Therapeutic exercise;Balance training;Neuromuscular re-education;Canalith Repostioning;Gait training;Patient/family education    PT Next Visit Plan Recheck Rt BPPV - (horizontal canal > posterior canal) Progress VOR and diagonal head turns to make it more challenging. Add compliant surfaces and tandem. Incorporate more gait training with head turns.    Consulted and Agree with Plan of Care Patient           Patient will benefit from skilled therapeutic intervention in order to improve the following deficits and impairments:  Dizziness,Decreased balance,Difficulty walking  Visit Diagnosis: BPPV (benign paroxysmal positional vertigo), right     Problem List Patient Active Problem List   Diagnosis Date Noted  . Arthritis 04/08/2018  . Glaucoma 04/08/2018  . Herpes simplex type 2 infection 04/08/2018  . Uterine leiomyoma 04/08/2018  . Non-recurrent acute suppurative otitis media of left ear with spontaneous rupture of tympanic  membrane 01/10/2018  . Pain 05/29/2017  . Throat pain in adult 05/28/2017  . Thrush of mouth and esophagus ([place]) 05/28/2017  . Acute bacterial sinusitis 10/10/2016  . De Quervain's syndrome (tenosynovitis) 01/25/2016  . Primary osteoarthritis of first carpometacarpal joint of right hand 01/25/2016  . Trigger finger of right thumb 01/25/2016  . Essential hypertension 12/19/2015  . Diastolic dysfunction 96/07/5407  . Dysphagia 01/04/2015  . Chest tightness 11/16/2014  . Dyspnea 11/15/2014  . COPD GOLD I 11/02/2014  . GERD (gastroesophageal reflux disease) 11/02/2014  . Proptosis 05/31/2014  . Glaucoma, bilateral 05/31/2014  . Ptosis of both eyelids 05/31/2014  . Xanthelasma of left upper eyelid 05/31/2014  . Pain in limb 08/18/2012  . Routine gynecological examination 07/24/2011  . Environmental allergies 07/24/2011  . Osteoporosis 07/24/2011  . Nocturia 07/24/2011  . Asthma 07/04/2007  . PULMONARY COLLAPSE 07/04/2007  . PULMONARY NODULE 07/04/2007    DildayJenness Corner, PT 03/10/2020, 5:12 PM  [facility] [address] [place], [place], [address] Phone: [phone]   Fax:  [phone]  Name: Anne [name] MRN: [id number] Date of Birth: 1931/03/24

## 2020-03-11 ENCOUNTER — Ambulatory Visit: Payer: Medicare PPO | Admitting: Physical Therapy

## 2020-03-14 ENCOUNTER — Ambulatory Visit: Payer: Medicare PPO | Admitting: Neurology

## 2020-03-14 ENCOUNTER — Ambulatory Visit: Payer: Medicare PPO | Admitting: Internal Medicine

## 2020-03-15 ENCOUNTER — Ambulatory Visit: Payer: Medicare PPO | Admitting: Physical Therapy

## 2020-03-17 ENCOUNTER — Other Ambulatory Visit: Payer: Self-pay

## 2020-03-17 ENCOUNTER — Ambulatory Visit: Payer: Medicare PPO | Admitting: Physical Therapy

## 2020-03-17 ENCOUNTER — Encounter: Payer: Self-pay | Admitting: Physical Therapy

## 2020-03-17 DIAGNOSIS — R2681 Unsteadiness on feet: Secondary | ICD-10-CM

## 2020-03-17 DIAGNOSIS — R262 Difficulty in walking, not elsewhere classified: Secondary | ICD-10-CM

## 2020-03-17 DIAGNOSIS — H8111 Benign paroxysmal vertigo, right ear: Secondary | ICD-10-CM

## 2020-03-17 DIAGNOSIS — R42 Dizziness and giddiness: Secondary | ICD-10-CM

## 2020-03-17 NOTE — Therapy (Signed)
[facility] [address] [place], [place], [address] Phone: [phone]   Fax:  [phone]  Physical Therapy Treatment  Patient Details  Name: Anne [name] MRN: [id number] Date of Birth: Apr 26, 1930 Referring Provider (PT): Dr. [name]   Encounter Date: 03/17/2020   PT End of Session - 03/17/20 1625    Visit Number 7    Number of Visits 13    Date for PT Re-Evaluation 03/25/20    Authorization Type [employer] - Approved for 12 visits from 02/09/20 > 03/25/20    Authorization Time Period 10th visit PN    PT Start Time 1450    PT Stop Time 1540    PT Time Calculation (min) 50 min    Activity Tolerance Patient tolerated treatment well    Behavior During Therapy [facility] for tasks assessed/performed           Past Medical History:  Diagnosis Date  . Allergic rhinitis   . Arthritis   . Asthma   . Atypical chest pain   . Collapse of right lung   . Dyslipidemia   . Gastroesophageal reflux disease   . Glaucoma   . Hematuria   . Hyperlipidemia   . Lung nodules     Past Surgical History:  Procedure Laterality Date  . ANAL FISSURE REPAIR    . BREAST BIOPSY Right   . CATARACT EXTRACTION Bilateral   . MASS EXCISION  04/11/2012   Procedure: EXCISION MASS;  Surgeon: Wynonia Sours, MD;  Location: [facility];  Service: Orthopedics;  Laterality: Right;  Excision of mass right upper arm  . MEDIASTINOSCOPY  4/11  . NASAL SINUS SURGERY    . TOTAL ABDOMINAL HYSTERECTOMY W/ BILATERAL SALPINGOOPHORECTOMY      There were no vitals filed for this visit.   Subjective Assessment - 03/17/20 1458    Subjective Has been having significant dizziness since last week.  Feels the worst and nauseous in the morning and better in the afternoon but today she feels pretty bad.  Repeated rolling has made her feel worse.    Pertinent History allergic rhinitis, OA, asthma, atypical chest pain, collapse of right  lung, dyslipidemia, GERD, glaucoma, hematuria, HLD, lung nodules, R breast biopsy, bilat cataract extraction, nasal sinus surgery    Patient Stated Goals resolve the vertigo and improve balance    Currently in Pain? Yes    Pain Score 5     Pain Location Neck    Pain Orientation Right    Pain Type Acute pain                   Vestibular Assessment - 03/17/20 1524      Positional Testing   Dix-Hallpike Dix-Hallpike Left;Dix-Hallpike Right    Horizontal Canal Testing Horizontal Canal Right;Horizontal Canal Left      Dix-Hallpike Right   Dix-Hallpike Right Duration 0    Dix-Hallpike Right Symptoms No nystagmus      Dix-Hallpike Left   Dix-Hallpike Left Duration 10 seconds    Dix-Hallpike Left Symptoms Left nystagmus      Horizontal Canal Right   Horizontal Canal Right Duration 15    Horizontal Canal Right Symptoms Geotrophic   more symptomatic to the R side, changed to apogeotrophic     Horizontal Canal Left   Horizontal Canal Left Duration 10    Horizontal Canal Left Symptoms Geotrophic  [facility] [address] [place], [place], [address] Phone: [phone]   Fax:  [phone]  Physical Therapy Treatment  Patient Details  Name: Anne [name] MRN: [id number] Date of Birth: Apr 26, 1930 Referring Provider (PT): Dr. [name]   Encounter Date: 03/17/2020   PT End of Session - 03/17/20 1625    Visit Number 7    Number of Visits 13    Date for PT Re-Evaluation 03/25/20    Authorization Type [employer] - Approved for 12 visits from 02/09/20 > 03/25/20    Authorization Time Period 10th visit PN    PT Start Time 1450    PT Stop Time 1540    PT Time Calculation (min) 50 min    Activity Tolerance Patient tolerated treatment well    Behavior During Therapy [facility] for tasks assessed/performed           Past Medical History:  Diagnosis Date  . Allergic rhinitis   . Arthritis   . Asthma   . Atypical chest pain   . Collapse of right lung   . Dyslipidemia   . Gastroesophageal reflux disease   . Glaucoma   . Hematuria   . Hyperlipidemia   . Lung nodules     Past Surgical History:  Procedure Laterality Date  . ANAL FISSURE REPAIR    . BREAST BIOPSY Right   . CATARACT EXTRACTION Bilateral   . MASS EXCISION  04/11/2012   Procedure: EXCISION MASS;  Surgeon: Wynonia Sours, MD;  Location: [facility];  Service: Orthopedics;  Laterality: Right;  Excision of mass right upper arm  . MEDIASTINOSCOPY  4/11  . NASAL SINUS SURGERY    . TOTAL ABDOMINAL HYSTERECTOMY W/ BILATERAL SALPINGOOPHORECTOMY      There were no vitals filed for this visit.   Subjective Assessment - 03/17/20 1458    Subjective Has been having significant dizziness since last week.  Feels the worst and nauseous in the morning and better in the afternoon but today she feels pretty bad.  Repeated rolling has made her feel worse.    Pertinent History allergic rhinitis, OA, asthma, atypical chest pain, collapse of right  lung, dyslipidemia, GERD, glaucoma, hematuria, HLD, lung nodules, R breast biopsy, bilat cataract extraction, nasal sinus surgery    Patient Stated Goals resolve the vertigo and improve balance    Currently in Pain? Yes    Pain Score 5     Pain Location Neck    Pain Orientation Right    Pain Type Acute pain                   Vestibular Assessment - 03/17/20 1524      Positional Testing   Dix-Hallpike Dix-Hallpike Left;Dix-Hallpike Right    Horizontal Canal Testing Horizontal Canal Right;Horizontal Canal Left      Dix-Hallpike Right   Dix-Hallpike Right Duration 0    Dix-Hallpike Right Symptoms No nystagmus      Dix-Hallpike Left   Dix-Hallpike Left Duration 10 seconds    Dix-Hallpike Left Symptoms Left nystagmus      Horizontal Canal Right   Horizontal Canal Right Duration 15    Horizontal Canal Right Symptoms Geotrophic   more symptomatic to the R side, changed to apogeotrophic     Horizontal Canal Left   Horizontal Canal Left Duration 10    Horizontal Canal Left Symptoms Geotrophic  Status New                 Plan - 03/17/20 1626    Clinical Impression Statement Pt continued to experience significant vertigo since last session.  Performed re-assessment of all peripheral canals with pt demonstrating ongoing R horizontal canal canalithiasis, treated x1 with Bar-b-que roll with full resolution of symptoms.   Removed repetitive rolling from HEP.  Will see pt for one more session to re-assess BPPV and assess progress towards goals.    Personal Factors and Comorbidities Age;Comorbidity 3+;Past/Current Experience;Social Background    Comorbidities allergic rhinitis, OA, asthma, atypical chest pain, collapse of right lung, dyslipidemia, GERD, glaucoma, hematuria, HLD, lung nodules, R breast biopsy, bilat cataract extraction, nasal sinus surgery    Examination-Activity Limitations Locomotion Level;Reach Overhead;Bed Mobility;Bend    [employer] Activity;Shop;Laundry;Meal Prep;Cleaning    Stability/Clinical Decision Making Stable/Uncomplicated    Rehab Potential Good    PT Frequency 2x / week    PT Duration 6 weeks    PT Treatment/Interventions ADLs/Self Care Home Management;Vestibular;Therapeutic activities;Therapeutic exercise;Balance training;Neuromuscular re-education;Canalith Repostioning;Gait training;Patient/family education    PT Next Visit Plan recheck BPPV, check LTG/FOTO, D/C?    Consulted and Agree with Plan of Care Patient           Patient will benefit from skilled therapeutic intervention in order to improve the following deficits and impairments:  Dizziness,Decreased balance,Difficulty walking  Visit Diagnosis: BPPV (benign paroxysmal positional vertigo), right  Dizziness and giddiness  Unsteadiness on feet  Difficulty in walking, not elsewhere classified     Problem List Patient Active Problem List   Diagnosis Date Noted  . Arthritis 04/08/2018  . Glaucoma 04/08/2018  . Herpes simplex type 2 infection 04/08/2018  . Uterine leiomyoma 04/08/2018  . Non-recurrent acute suppurative otitis media of left ear with spontaneous rupture of tympanic membrane 01/10/2018  . Pain 05/29/2017  . Throat pain in adult 05/28/2017  . Thrush of mouth and esophagus ([place]) 05/28/2017  . Acute bacterial sinusitis 10/10/2016  . De Quervain's syndrome  (tenosynovitis) 01/25/2016  . Primary osteoarthritis of first carpometacarpal joint of right hand 01/25/2016  . Trigger finger of right thumb 01/25/2016  . Essential hypertension 12/19/2015  . Diastolic dysfunction 04/10/3233  . Dysphagia 01/04/2015  . Chest tightness 11/16/2014  . Dyspnea 11/15/2014  . COPD GOLD I 11/02/2014  . GERD (gastroesophageal reflux disease) 11/02/2014  . Proptosis 05/31/2014  . Glaucoma, bilateral 05/31/2014  . Ptosis of both eyelids 05/31/2014  . Xanthelasma of left upper eyelid 05/31/2014  . Pain in limb 08/18/2012  . Routine gynecological examination 07/24/2011  . Environmental allergies 07/24/2011  . Osteoporosis 07/24/2011  . Nocturia 07/24/2011  . Asthma 07/04/2007  . PULMONARY COLLAPSE 07/04/2007  . PULMONARY NODULE 07/04/2007    Rico Junker, PT, DPT 03/17/20    4:38 PM    [facility] [address] [place], [place], [address] Phone: [phone]   Fax:  [phone]  Name: Anne [name] MRN: [id number] Date of Birth: Mar 27, 1931

## 2020-03-17 NOTE — Patient Instructions (Signed)
Flexibility: Upper Trapezius Stretch    Gently Tilt left ear over towards left shoulder until a gentle stretch is felt on the right side of the neck.  Try to keep your body from tilting.  Try to keep the right shoulder down. Hold __30__ seconds. Repeat _2___ times per set.    Levator Stretch    Turn head toward the LEFT side and look down at your arm pit until you feel a stretch on the right side. Hold __30__ seconds.  Repeat ___2_ times.     Shoulder Rolls   Movement Move your shoulders upward, backward, down and then forwards in a smooth circular motion. Repeat 5 times, continuing to move your shoulders in a circular motion.   Gaze Stabilization - Tip Card  1.Target must remain in focus, not blurry, and appear stationary while head is in motion. 2.Perform exercises with small head movements (45 to either side of midline). 3.Increase speed of head motion so long as target is in focus. 4.If you wear eyeglasses, be sure you can see target through lens (therapist will give specific instructions for bifocal / progressive lenses). 5.These exercises may provoke dizziness or nausea. Work through these symptoms. If too dizzy, slow head movement slightly. Rest between each exercise. 6.Exercises demand concentration; avoid distractions. 7.For safety, perform standing exercises close to a counter, wall, corner, or next to someone.  Copyright  VHI. All rights reserved.   Gaze Stabilization - Standing Feet Apart   Feet shoulder width apart, keeping eyes on target on wall 3 feet away, tilt head down slightly and move head side to side for 45 seconds, try to keep the shoulders still. Then perform head nods up and down, SLOW AND SMOOTH, for 30 seconds.  Do 2-3 sessions per day.    Feet Together (Compliant Surface) Head Motion - Eyes Open    With eyes open, standing on compliant surface: Pillow, feet together, move head slowly: left and right/side to side for 30 seconds. Repeat 2  times per session. Do 1 sessions per day.

## 2020-03-21 ENCOUNTER — Ambulatory Visit: Payer: Medicare PPO | Admitting: Physical Therapy

## 2020-03-21 ENCOUNTER — Encounter: Payer: Self-pay | Admitting: Physical Therapy

## 2020-03-21 ENCOUNTER — Other Ambulatory Visit: Payer: Self-pay

## 2020-03-21 DIAGNOSIS — R2681 Unsteadiness on feet: Secondary | ICD-10-CM

## 2020-03-21 DIAGNOSIS — R262 Difficulty in walking, not elsewhere classified: Secondary | ICD-10-CM

## 2020-03-21 DIAGNOSIS — R42 Dizziness and giddiness: Secondary | ICD-10-CM | POA: Diagnosis not present

## 2020-03-21 DIAGNOSIS — H8111 Benign paroxysmal vertigo, right ear: Secondary | ICD-10-CM

## 2020-03-21 NOTE — Therapy (Signed)
Pain in limb 08/18/2012  . Routine gynecological examination 07/24/2011  . Environmental allergies 07/24/2011  . Osteoporosis 07/24/2011  . Nocturia 07/24/2011  . Asthma 07/04/2007  . PULMONARY COLLAPSE 07/04/2007  . PULMONARY NODULE 07/04/2007    Rico Junker, PT, DPT 03/21/20    4:59 PM    [facility] [address] [place], [place], [address] Phone: [phone]   Fax:  [phone]  Name: Anne [name] MRN: [id number] Date of Birth: 1930-10-03  Pain in limb 08/18/2012  . Routine gynecological examination 07/24/2011  . Environmental allergies 07/24/2011  . Osteoporosis 07/24/2011  . Nocturia 07/24/2011  . Asthma 07/04/2007  . PULMONARY COLLAPSE 07/04/2007  . PULMONARY NODULE 07/04/2007    Rico Junker, PT, DPT 03/21/20    4:59 PM    [facility] [address] [place], [place], [address] Phone: [phone]   Fax:  [phone]  Name: Anne [name] MRN: [id number] Date of Birth: 1930-10-03  [facility] [address] [place], [place], [address] Phone: [phone]   Fax:  [phone]  Physical Therapy Treatment  Patient Details  Name: Anne [name] MRN: [id number] Date of Birth: 1931-03-12 Referring Provider (PT): Dr. [name]   Encounter Date: 03/21/2020   PT End of Session - 03/21/20 1653    Visit Number 8    Number of Visits 13    Date for PT Re-Evaluation 03/25/20    Authorization Type [employer] - Approved for 12 visits from 02/09/20 > 03/25/20    Authorization Time Period 10th visit PN    PT Start Time 1453    PT Stop Time 1531    PT Time Calculation (min) 38 min    Activity Tolerance Patient tolerated treatment well    Behavior During Therapy [facility] for tasks assessed/performed           Past Medical History:  Diagnosis Date  . Allergic rhinitis   . Arthritis   . Asthma   . Atypical chest pain   . Collapse of right lung   . Dyslipidemia   . Gastroesophageal reflux disease   . Glaucoma   . Hematuria   . Hyperlipidemia   . Lung nodules     Past Surgical History:  Procedure Laterality Date  . ANAL FISSURE REPAIR    . BREAST BIOPSY Right   . CATARACT EXTRACTION Bilateral   . MASS EXCISION  04/11/2012   Procedure: EXCISION MASS;  Surgeon: Wynonia Sours, MD;  Location: [facility];  Service: Orthopedics;  Laterality: Right;  Excision of mass right upper arm  . MEDIASTINOSCOPY  4/11  . NASAL SINUS SURGERY    . TOTAL ABDOMINAL HYSTERECTOMY W/ BILATERAL SALPINGOOPHORECTOMY      There were no vitals filed for this visit.   Subjective Assessment - 03/21/20 1456    Subjective Was very excited when she left last week but her neck started to hurt; she thought her pillow was too thick so she changed to a flat pillow but that brought on the dizziness.  Dizziness came back this morning.    Pertinent History allergic rhinitis, OA, asthma, atypical chest pain, collapse of  right lung, dyslipidemia, GERD, glaucoma, hematuria, HLD, lung nodules, R breast biopsy, bilat cataract extraction, nasal sinus surgery    Patient Stated Goals resolve the vertigo and improve balance    Currently in Pain? No/denies                   Vestibular Assessment - 03/21/20 1458      Oculomotor Exam-Fixation Suppressed    Left Head Impulse positive    Right Head Impulse positive      Positional Testing   Dix-Hallpike Dix-Hallpike Left;Dix-Hallpike Right    Horizontal Canal Testing Horizontal Canal Right;Horizontal Canal Left      Dix-Hallpike Right   Dix-Hallpike Right Duration 0    Dix-Hallpike Right Symptoms No nystagmus      Dix-Hallpike Left   Dix-Hallpike Left Duration 10    Dix-Hallpike Left Symptoms Left nystagmus   L beating, no vertigo     Horizontal Canal Right   Horizontal Canal Right Duration 0    Horizontal Canal Right Symptoms Normal      Horizontal Canal Left   Horizontal Canal Left Duration 10    Horizontal Canal Left Symptoms Geotrophic   no vertigo  Pain in limb 08/18/2012  . Routine gynecological examination 07/24/2011  . Environmental allergies 07/24/2011  . Osteoporosis 07/24/2011  . Nocturia 07/24/2011  . Asthma 07/04/2007  . PULMONARY COLLAPSE 07/04/2007  . PULMONARY NODULE 07/04/2007    Rico Junker, PT, DPT 03/21/20    4:59 PM    [facility] [address] [place], [place], [address] Phone: [phone]   Fax:  [phone]  Name: Anne [name] MRN: [id number] Date of Birth: 1930-10-03

## 2020-03-21 NOTE — Patient Instructions (Signed)
Flexibility: Upper Trapezius Stretch    Gently Tilt left ear over towards left shoulder until a gentle stretch is felt on the right side of the neck.  Try to keep your body from tilting.  Try to keep the right shoulder down. Hold __30__ seconds. Repeat _2___ times per set.    Levator Stretch    Turn head toward the LEFT side and look down at your arm pit until you feel a stretch on the right side. Hold __30__ seconds.  Repeat ___2_ times.     Shoulder Rolls   Movement Move your shoulders upward, backward, down and then forwards in a smooth circular motion. Repeat 5 times, continuing to move your shoulders in a circular motion.   Gaze Stabilization - Tip Card  1.Target must remain in focus, not blurry, and appear stationary while head is in motion. 2.Perform exercises with small head movements (45 to either side of midline). 3.Increase speed of head motion so long as target is in focus. 4.If you wear eyeglasses, be sure you can see target through lens (therapist will give specific instructions for bifocal / progressive lenses). 5.These exercises may provoke dizziness or nausea. Work through these symptoms. If too dizzy, slow head movement slightly. Rest between each exercise. 6.Exercises demand concentration; avoid distractions. 7.For safety, perform standing exercises close to a counter, wall, corner, or next to someone.  Copyright  VHI. All rights reserved.   Gaze Stabilization - Standing Feet Apart   Feet shoulder width apart, keeping eyes on target on wall 3 feet away, tilt head down slightly and move head side to side for 60 seconds. Repeat while moving head up and down for 60 seconds.

## 2020-03-24 ENCOUNTER — Ambulatory Visit: Payer: Medicare PPO | Admitting: Physical Therapy

## 2020-04-04 ENCOUNTER — Ambulatory Visit: Payer: Medicare PPO | Admitting: Physical Therapy

## 2020-04-08 ENCOUNTER — Other Ambulatory Visit: Payer: Self-pay

## 2020-04-08 ENCOUNTER — Ambulatory Visit: Payer: Medicare PPO | Attending: Geriatric Medicine | Admitting: Physical Therapy

## 2020-04-08 ENCOUNTER — Encounter: Payer: Self-pay | Admitting: Physical Therapy

## 2020-04-08 DIAGNOSIS — H8111 Benign paroxysmal vertigo, right ear: Secondary | ICD-10-CM | POA: Diagnosis not present

## 2020-04-08 DIAGNOSIS — R262 Difficulty in walking, not elsewhere classified: Secondary | ICD-10-CM | POA: Insufficient documentation

## 2020-04-08 DIAGNOSIS — R2681 Unsteadiness on feet: Secondary | ICD-10-CM | POA: Insufficient documentation

## 2020-04-08 DIAGNOSIS — R42 Dizziness and giddiness: Secondary | ICD-10-CM | POA: Insufficient documentation

## 2020-04-08 NOTE — Therapy (Addendum)
[facility] [address] [place], [place], [address] Phone: [phone]   Fax:  [phone]  Physical Therapy Treatment and Discharge Summary  Patient Details  Name: Anne [name] MRN: [id number] Date of Birth: 15-Feb-1931 Referring Provider (PT): Dr. [name]   Encounter Date: 04/08/2020   PT End of Session - 04/08/20 1612     Visit Number 9    Number of Visits 13    Date for PT Re-Evaluation 04/08/20    Authorization Type [employer] - Approved for 12 visits from 02/09/20 > 03/25/20    Authorization Time Period 10th visit PN    PT Start Time 1315    PT Stop Time 1400    PT Time Calculation (min) 45 min    Activity Tolerance Patient tolerated treatment well    Behavior During Therapy WFL for tasks assessed/performed             Past Medical History:  Diagnosis Date   Allergic rhinitis    Arthritis    Asthma    Atypical chest pain    Collapse of right lung    Dyslipidemia    Gastroesophageal reflux disease    Glaucoma    Hematuria    Hyperlipidemia    Lung nodules     Past Surgical History:  Procedure Laterality Date   ANAL FISSURE REPAIR     BREAST BIOPSY Right    CATARACT EXTRACTION Bilateral    MASS EXCISION  04/11/2012   Procedure: EXCISION MASS;  Surgeon: Wynonia Sours, MD;  Location: [facility];  Service: Orthopedics;  Laterality: Right;  Excision of mass right upper arm   MEDIASTINOSCOPY  4/11   NASAL SINUS SURGERY     TOTAL ABDOMINAL HYSTERECTOMY W/ BILATERAL SALPINGOOPHORECTOMY      There were no vitals filed for this visit.   Subjective Assessment - 04/08/20 1320     Subjective Pt reports feeling better after last session but began starting having other symptoms: eye heaviness, dull headache.  Physician recommended that pt come back in for another assessment for vertigo. Pt feels it may be related to sinus issues; has been feeling better the past couple of  days.    Pertinent History allergic rhinitis, OA, asthma, atypical chest pain, collapse of right lung, dyslipidemia, GERD, glaucoma, hematuria, HLD, lung nodules, R breast biopsy, bilat cataract extraction, nasal sinus surgery    Patient Stated Goals resolve the vertigo and improve balance    Currently in Pain? No/denies                [facility] PT Assessment - 04/08/20 1351       Observation/Other Assessments   Focus on Therapeutic Outcomes (FOTO)  85%    Other Surveys  Dizziness Handicap Inventory (DHI)    Dizziness Handicap Inventory [facility])  6      Functional Gait  Assessment   Gait assessed  Yes    Gait Level Surface Walks 20 ft in less than 5.5 sec, no assistive devices, good speed, no evidence for imbalance, normal gait pattern, deviates no more than 6 in outside of the 12 in walkway width.    Change in Gait Speed Able to change speed, demonstrates mild gait deviations, deviates 6-10 in outside of the 12 in walkway width, or no gait deviations, unable to achieve a major change in velocity, or uses a change in velocity, or uses an assistive device.    Gait with Horizontal Head Turns Performs  Visit Diagnosis: BPPV (benign paroxysmal positional vertigo), right  Dizziness and giddiness  Unsteadiness on feet  Difficulty in walking, not elsewhere classified     Problem List Patient Active Problem List   Diagnosis Date Noted   Arthritis 04/08/2018   Glaucoma 04/08/2018   Herpes simplex type 2 infection 04/08/2018   Uterine leiomyoma 04/08/2018   Non-recurrent acute suppurative otitis media of left ear with spontaneous rupture of tympanic membrane 01/10/2018   Pain 05/29/2017   Throat pain in adult 05/28/2017   Thrush of mouth and esophagus ([facility]) 05/28/2017   Acute bacterial sinusitis 10/10/2016   De Quervain's syndrome (tenosynovitis) 01/25/2016   Primary osteoarthritis of first carpometacarpal joint of right hand 01/25/2016   Trigger finger of right thumb 01/25/2016   Essential hypertension 46/56/8127   Diastolic dysfunction 51/70/0174   Dysphagia 01/04/2015   Chest tightness 11/16/2014   Dyspnea 11/15/2014   COPD GOLD I  11/02/2014   GERD (gastroesophageal reflux disease) 11/02/2014   Proptosis 05/31/2014   Glaucoma, bilateral 05/31/2014   Ptosis of both eyelids 05/31/2014   Xanthelasma of left upper eyelid 05/31/2014   Pain in limb 08/18/2012   Routine gynecological examination 07/24/2011   Environmental allergies 07/24/2011   Osteoporosis 07/24/2011   Nocturia 07/24/2011   Asthma 07/04/2007   PULMONARY COLLAPSE 07/04/2007   PULMONARY NODULE 07/04/2007    Rico Junker, PT, DPT 04/08/20    4:20 PM   PHYSICAL THERAPY DISCHARGE SUMMARY  Visits from Start of Care: 9  Current functional level related to goals / functional outcomes: See impression statement and LTG achievement above   Remaining deficits: Mild imbalance; vertigo resolved   Education / Equipment: HEP  Plan: Patient agrees to discharge.  Patient goals were partially met. Patient is being discharged due to being pleased with the current functional level.       Rico Junker, PT, DPT 04/08/20    4:20 PM    [facility] [address] [place], [place], [address] Phone: [phone]   Fax:  [phone]  Name: Anne [name] MRN: [id number] Date of Birth: 06-May-1930  [facility] [address] [place], [place], [address] Phone: [phone]   Fax:  [phone]  Physical Therapy Treatment and Discharge Summary  Patient Details  Name: Anne [name] MRN: [id number] Date of Birth: 15-Feb-1931 Referring Provider (PT): Dr. [name]   Encounter Date: 04/08/2020   PT End of Session - 04/08/20 1612     Visit Number 9    Number of Visits 13    Date for PT Re-Evaluation 04/08/20    Authorization Type [employer] - Approved for 12 visits from 02/09/20 > 03/25/20    Authorization Time Period 10th visit PN    PT Start Time 1315    PT Stop Time 1400    PT Time Calculation (min) 45 min    Activity Tolerance Patient tolerated treatment well    Behavior During Therapy WFL for tasks assessed/performed             Past Medical History:  Diagnosis Date   Allergic rhinitis    Arthritis    Asthma    Atypical chest pain    Collapse of right lung    Dyslipidemia    Gastroesophageal reflux disease    Glaucoma    Hematuria    Hyperlipidemia    Lung nodules     Past Surgical History:  Procedure Laterality Date   ANAL FISSURE REPAIR     BREAST BIOPSY Right    CATARACT EXTRACTION Bilateral    MASS EXCISION  04/11/2012   Procedure: EXCISION MASS;  Surgeon: Wynonia Sours, MD;  Location: [facility];  Service: Orthopedics;  Laterality: Right;  Excision of mass right upper arm   MEDIASTINOSCOPY  4/11   NASAL SINUS SURGERY     TOTAL ABDOMINAL HYSTERECTOMY W/ BILATERAL SALPINGOOPHORECTOMY      There were no vitals filed for this visit.   Subjective Assessment - 04/08/20 1320     Subjective Pt reports feeling better after last session but began starting having other symptoms: eye heaviness, dull headache.  Physician recommended that pt come back in for another assessment for vertigo. Pt feels it may be related to sinus issues; has been feeling better the past couple of  days.    Pertinent History allergic rhinitis, OA, asthma, atypical chest pain, collapse of right lung, dyslipidemia, GERD, glaucoma, hematuria, HLD, lung nodules, R breast biopsy, bilat cataract extraction, nasal sinus surgery    Patient Stated Goals resolve the vertigo and improve balance    Currently in Pain? No/denies                [facility] PT Assessment - 04/08/20 1351       Observation/Other Assessments   Focus on Therapeutic Outcomes (FOTO)  85%    Other Surveys  Dizziness Handicap Inventory (DHI)    Dizziness Handicap Inventory [facility])  6      Functional Gait  Assessment   Gait assessed  Yes    Gait Level Surface Walks 20 ft in less than 5.5 sec, no assistive devices, good speed, no evidence for imbalance, normal gait pattern, deviates no more than 6 in outside of the 12 in walkway width.    Change in Gait Speed Able to change speed, demonstrates mild gait deviations, deviates 6-10 in outside of the 12 in walkway width, or no gait deviations, unable to achieve a major change in velocity, or uses a change in velocity, or uses an assistive device.    Gait with Horizontal Head Turns Performs  [facility] [address] [place], [place], [address] Phone: [phone]   Fax:  [phone]  Physical Therapy Treatment and Discharge Summary  Patient Details  Name: Anne [name] MRN: [id number] Date of Birth: 15-Feb-1931 Referring Provider (PT): Dr. [name]   Encounter Date: 04/08/2020   PT End of Session - 04/08/20 1612     Visit Number 9    Number of Visits 13    Date for PT Re-Evaluation 04/08/20    Authorization Type [employer] - Approved for 12 visits from 02/09/20 > 03/25/20    Authorization Time Period 10th visit PN    PT Start Time 1315    PT Stop Time 1400    PT Time Calculation (min) 45 min    Activity Tolerance Patient tolerated treatment well    Behavior During Therapy WFL for tasks assessed/performed             Past Medical History:  Diagnosis Date   Allergic rhinitis    Arthritis    Asthma    Atypical chest pain    Collapse of right lung    Dyslipidemia    Gastroesophageal reflux disease    Glaucoma    Hematuria    Hyperlipidemia    Lung nodules     Past Surgical History:  Procedure Laterality Date   ANAL FISSURE REPAIR     BREAST BIOPSY Right    CATARACT EXTRACTION Bilateral    MASS EXCISION  04/11/2012   Procedure: EXCISION MASS;  Surgeon: Wynonia Sours, MD;  Location: [facility];  Service: Orthopedics;  Laterality: Right;  Excision of mass right upper arm   MEDIASTINOSCOPY  4/11   NASAL SINUS SURGERY     TOTAL ABDOMINAL HYSTERECTOMY W/ BILATERAL SALPINGOOPHORECTOMY      There were no vitals filed for this visit.   Subjective Assessment - 04/08/20 1320     Subjective Pt reports feeling better after last session but began starting having other symptoms: eye heaviness, dull headache.  Physician recommended that pt come back in for another assessment for vertigo. Pt feels it may be related to sinus issues; has been feeling better the past couple of  days.    Pertinent History allergic rhinitis, OA, asthma, atypical chest pain, collapse of right lung, dyslipidemia, GERD, glaucoma, hematuria, HLD, lung nodules, R breast biopsy, bilat cataract extraction, nasal sinus surgery    Patient Stated Goals resolve the vertigo and improve balance    Currently in Pain? No/denies                [facility] PT Assessment - 04/08/20 1351       Observation/Other Assessments   Focus on Therapeutic Outcomes (FOTO)  85%    Other Surveys  Dizziness Handicap Inventory (DHI)    Dizziness Handicap Inventory [facility])  6      Functional Gait  Assessment   Gait assessed  Yes    Gait Level Surface Walks 20 ft in less than 5.5 sec, no assistive devices, good speed, no evidence for imbalance, normal gait pattern, deviates no more than 6 in outside of the 12 in walkway width.    Change in Gait Speed Able to change speed, demonstrates mild gait deviations, deviates 6-10 in outside of the 12 in walkway width, or no gait deviations, unable to achieve a major change in velocity, or uses a change in velocity, or uses an assistive device.    Gait with Horizontal Head Turns Performs

## 2020-04-11 DIAGNOSIS — H401112 Primary open-angle glaucoma, right eye, moderate stage: Secondary | ICD-10-CM | POA: Diagnosis not present

## 2020-04-11 DIAGNOSIS — H401121 Primary open-angle glaucoma, left eye, mild stage: Secondary | ICD-10-CM | POA: Diagnosis not present

## 2020-04-11 DIAGNOSIS — H04123 Dry eye syndrome of bilateral lacrimal glands: Secondary | ICD-10-CM | POA: Diagnosis not present

## 2020-04-11 DIAGNOSIS — H0263 Xanthelasma of right eye, unspecified eyelid: Secondary | ICD-10-CM | POA: Diagnosis not present

## 2020-04-21 ENCOUNTER — Other Ambulatory Visit: Payer: Self-pay | Admitting: Geriatric Medicine

## 2020-04-21 DIAGNOSIS — M8580 Other specified disorders of bone density and structure, unspecified site: Secondary | ICD-10-CM

## 2020-04-21 DIAGNOSIS — Z780 Asymptomatic menopausal state: Secondary | ICD-10-CM

## 2020-05-04 DIAGNOSIS — R519 Headache, unspecified: Secondary | ICD-10-CM | POA: Diagnosis not present

## 2020-05-04 DIAGNOSIS — H8110 Benign paroxysmal vertigo, unspecified ear: Secondary | ICD-10-CM | POA: Diagnosis not present

## 2020-05-04 DIAGNOSIS — R0981 Nasal congestion: Secondary | ICD-10-CM | POA: Diagnosis not present

## 2020-05-06 ENCOUNTER — Other Ambulatory Visit: Payer: Self-pay

## 2020-05-06 ENCOUNTER — Ambulatory Visit: Payer: Medicare PPO | Admitting: Internal Medicine

## 2020-05-06 ENCOUNTER — Encounter: Payer: Self-pay | Admitting: Internal Medicine

## 2020-05-06 VITALS — BP 122/78 | HR 91 | Temp 97.0°F | Ht 67.0 in | Wt 164.0 lb

## 2020-05-06 DIAGNOSIS — J4530 Mild persistent asthma, uncomplicated: Secondary | ICD-10-CM | POA: Diagnosis not present

## 2020-05-06 MED ORDER — BREO ELLIPTA 100-25 MCG/INH IN AEPB: INHALATION_SPRAY | RESPIRATORY_TRACT | 4 refills | Status: DC

## 2020-05-06 MED ORDER — ALBUTEROL SULFATE HFA 108 (90 BASE) MCG/ACT IN AERS: INHALATION_SPRAY | RESPIRATORY_TRACT | 3 refills | Status: DC

## 2020-05-06 NOTE — Patient Instructions (Addendum)
ICD-10-CM   1. Mild persistent asthma without complication  C58.52     Stable and well controlled  Plan  - cotninue breo daily -   - at some point in future can discuss dose reduction  - continue albuterol as needed -CMA to ensure refills on her inhalers   - glad you are uptodate with your vaccines  Followup  6-9 months or sooner if needed

## 2020-05-06 NOTE — Progress Notes (Signed)
HPI Chief Complaint  Patient presents with  . Acute Visit    cough     Referring provider: [name], MD  HPI: 87 year old female never smoker followed for mild persistent asthma History of diastolic dysfunction Known chronic right middle lobe collapse/ lymphadenopathy -s/p 07/2007 FOB +psuedomonas  Test Exhaled nitric oxide test May 2018: 17 ppb High resolution CT chest August 2016 showed no evidence of interstitial lung disease, chronic collapse of right middle lobe MEdiastinaoscopy 07/27/2009 - Benign node of 4R and 4L. ANthracotoic pigment +. No granuoma. Non malignant PFT 10/28/2014 FEV1 94, ratio 54, FVC 135, no significant bronchodilator response, mid flows reversibility, DLCO 65%   04/25/2017 Acute OV : Asthma  Patient presents for an acute office visit.  She complains of 3 weeks of cough ,congestion sinus congestion, drainage , wheezing . Seen PCP dx w/ bronchitis , rx  , Doxycycline and Prednisone . Feels some better but still has chest congestion and tightness .  She remains on Breo daily.    Appetite is okay w/ no nv/d.  No chest pain, orthopnea or ede   OV 06/05/2017  Chief Complaint  Patient presents with  . Follow-up    Pt states she has been doing good since last visit. Pt states she does become SOB with exertion. Denies any cough or CP.    Fu Mild persiostent asthma with associated diastolic dysfunction  Contines on broe. Overall stable. But last  4 month has has had 2 surgeries for hammer toe on left food. So less active. Correlating with this more dyspnea on exertion than usual. Also, found to have mold in the bathroom and professionally removed . Has not affected her but she is asking for feno and walk test both of which aare normal. She wants sample breo       OV 12/17/2017  Subjective:  Patient ID: Darrold Span, female , DOB: 03/11/1931 , age 84 y.o. , MRN: [id number] , ADDRESS: [place] [address]   12/17/2017 -    Chief Complaint  Patient presents with  . Follow-up    Pt states she has been doing well since last visit and denies any complaints.   Fu Mild persiostent asthma with associated diastolic dysfunction  HPI [name] 87 y.o. -  Mild persistent asthma follow-up. Recently she was switched to Brio on Qvar. This is helped his symptoms. In addition she is in the mold in the house.She feels great. She is somewhat she has done well. No interim asthma exacerbations. She will have a high dose flu shot today. There are really no other issues other than some mild recurrence of costochondral chest pain in the last few days is a recurrent problem for her.     Results for HARVEY, KAMER (MRN [id number]) as of 06/05/2017 14:14  Ref. Range 08/07/2016 12:01 02/12/2017 11:30 06/05/2017 13:04  Nitric Oxide Unknown 17 18 20    OV 01/15/2019  Subjective:  Patient ID: Darrold Span, female , DOB: [birth year] , age 74 y.o. , MRN: [id number] , ADDRESS: [place] [address]   01/15/2019 -   Chief Complaint  Patient presents with  . Follow-up    Pt states her breathing is doing well. Pt denies any increase in SOB, cough, CP/tightness, f/c/s, body aches.    87 year old female never smoker followed for mild persistent asthma History of diastolic dysfunction Known chronic right middle lobe collapse/ lymphadenopathy -s/p 07/2007 FOB +psuedomonas - MEdiastinaoscopy 07/27/2009 -  HPI Chief Complaint  Patient presents with  . Acute Visit    cough     Referring provider: [name], MD  HPI: 87 year old female never smoker followed for mild persistent asthma History of diastolic dysfunction Known chronic right middle lobe collapse/ lymphadenopathy -s/p 07/2007 FOB +psuedomonas  Test Exhaled nitric oxide test May 2018: 17 ppb High resolution CT chest August 2016 showed no evidence of interstitial lung disease, chronic collapse of right middle lobe MEdiastinaoscopy 07/27/2009 - Benign node of 4R and 4L. ANthracotoic pigment +. No granuoma. Non malignant PFT 10/28/2014 FEV1 94, ratio 54, FVC 135, no significant bronchodilator response, mid flows reversibility, DLCO 65%   04/25/2017 Acute OV : Asthma  Patient presents for an acute office visit.  She complains of 3 weeks of cough ,congestion sinus congestion, drainage , wheezing . Seen PCP dx w/ bronchitis , rx  , Doxycycline and Prednisone . Feels some better but still has chest congestion and tightness .  She remains on Breo daily.    Appetite is okay w/ no nv/d.  No chest pain, orthopnea or ede   OV 06/05/2017  Chief Complaint  Patient presents with  . Follow-up    Pt states she has been doing good since last visit. Pt states she does become SOB with exertion. Denies any cough or CP.    Fu Mild persiostent asthma with associated diastolic dysfunction  Contines on broe. Overall stable. But last  4 month has has had 2 surgeries for hammer toe on left food. So less active. Correlating with this more dyspnea on exertion than usual. Also, found to have mold in the bathroom and professionally removed . Has not affected her but she is asking for feno and walk test both of which aare normal. She wants sample breo       OV 12/17/2017  Subjective:  Patient ID: Darrold Span, female , DOB: 03/11/1931 , age 84 y.o. , MRN: [id number] , ADDRESS: [place] [address]   12/17/2017 -    Chief Complaint  Patient presents with  . Follow-up    Pt states she has been doing well since last visit and denies any complaints.   Fu Mild persiostent asthma with associated diastolic dysfunction  HPI [name] 87 y.o. -  Mild persistent asthma follow-up. Recently she was switched to Brio on Qvar. This is helped his symptoms. In addition she is in the mold in the house.She feels great. She is somewhat she has done well. No interim asthma exacerbations. She will have a high dose flu shot today. There are really no other issues other than some mild recurrence of costochondral chest pain in the last few days is a recurrent problem for her.     Results for HARVEY, KAMER (MRN [id number]) as of 06/05/2017 14:14  Ref. Range 08/07/2016 12:01 02/12/2017 11:30 06/05/2017 13:04  Nitric Oxide Unknown 17 18 20    OV 01/15/2019  Subjective:  Patient ID: Darrold Span, female , DOB: [birth year] , age 74 y.o. , MRN: [id number] , ADDRESS: [place] [address]   01/15/2019 -   Chief Complaint  Patient presents with  . Follow-up    Pt states her breathing is doing well. Pt denies any increase in SOB, cough, CP/tightness, f/c/s, body aches.    87 year old female never smoker followed for mild persistent asthma History of diastolic dysfunction Known chronic right middle lobe collapse/ lymphadenopathy -s/p 07/2007 FOB +psuedomonas - MEdiastinaoscopy 07/27/2009 -  Benign node of 4R and 4L. ANthracotoic pigment +. No granuoma. Non malignant   HPI THERSEA MANFREDONIA 87 y.o. -presents for follow-up.  Last seen September 2019 by myself.  In the interim visit with the nurse practitioner.  She says the [place] works well for her.  She says asthma is really well controlled.  No new symptoms.  She is up-to-date with the flu shot.  She is extremely active.  She is social distancing and isolating quite well and only doing low risk COVID-19 activities.  There are no new complaints or issues.  OV  08/17/2019  Subjective:  Patient ID: Darrold Span, female , DOB: 1930/04/10 , age 43 y.o. , MRN: [id number] , ADDRESS: [id number] [place] [address]   08/17/2019 -   Chief Complaint  Patient presents with  . Follow-up    Pt states she has been doing well since last visit and denies any complaints.   Female never smoker followed for mild persistent asthma  History of diastolic dysfunction  Known chronic right middle lobe collapse/ lymphadenopathy -s/p 07/2007 FOB +psuedomonas - MEdiastinaoscopy 07/27/2009 - Benign node of 4R and 4L. ANthracotoic pigment +. No granuoma. Non malignant  - last cxr 2019 and clear  HPI RASHEIDA BRODEN 87 y.o. -presents for follow-up.  Since last visit she has been doing well.  She has not had a Covid vaccine.  She continues social distancing.  She continues Breo.  She does not want to downsize the inhaler because of Covid.  She feels she needs extra protection.  She wants to maintain her health.  She is glad she has done well so far overall.  Her last chest x-ray was in 2019 she is willing to have another chest x-ray.  In terms of asthma asthma control question is 0 out of 5.  She not waking up in the middle of the night when she wakes up she has no problems she not having any activity limitation because of asthma.  She had wheezing because of asthma she not having any shortness of breath because of asthma she not using albuterol for rescue.     ROS - per HPI  OV 05/06/2020  Subjective:  Patient ID: Chyrel Masson, female , DOB: 07-18-1930 , age 22 y.o. , MRN: [id number] , ADDRESS: [address] [place] [address] PCP [name], MD Patient Care Team: [name], MD as PCP - General (Internal Medicine) Daryll Brod, MD as Consulting Physician (Orthopedic Surgery) [name], DO as Consulting Physician (Neurology)  This Provider for this visit: Treatment Team:  Attending Provider: Brand Males, MD    05/06/2020 -    Chief Complaint  Patient presents with  . Follow-up    No compaints   Female never smoker followed for mild persistent asthma  History of diastolic dysfunction  Known chronic right middle lobe collapse/ lymphadenopathy -s/p 07/2007 FOB +psuedomonas - MEdiastinaoscopy 07/27/2009 - Benign node of 4R and 4L. ANthracotoic pigment +. No granuoma. Non malignant  - last cxr 2019 and clear   HPI Zeenat Jeanbaptiste 87 y.o. -returns for follow-up for her mild persistent asthma.  She is doing well.  ACT control score is 25.  She was asking about a chest x-ray but I reminded her that in May 2021 she had a chest x-ray that was clear.  I showed her the film.  She was satisfied with that.  There are no new issues she wants refills on her inhalers.  She is  Benign node of 4R and 4L. ANthracotoic pigment +. No granuoma. Non malignant   HPI THERSEA MANFREDONIA 87 y.o. -presents for follow-up.  Last seen September 2019 by myself.  In the interim visit with the nurse practitioner.  She says the [place] works well for her.  She says asthma is really well controlled.  No new symptoms.  She is up-to-date with the flu shot.  She is extremely active.  She is social distancing and isolating quite well and only doing low risk COVID-19 activities.  There are no new complaints or issues.  OV  08/17/2019  Subjective:  Patient ID: Darrold Span, female , DOB: 1930/04/10 , age 43 y.o. , MRN: [id number] , ADDRESS: [address] [place] [address]   08/17/2019 -   Chief Complaint  Patient presents with  . Follow-up    Pt states she has been doing well since last visit and denies any complaints.   Female never smoker followed for mild persistent asthma  History of diastolic dysfunction  Known chronic right middle lobe collapse/ lymphadenopathy -s/p 07/2007 FOB +psuedomonas - MEdiastinaoscopy 07/27/2009 - Benign node of 4R and 4L. ANthracotoic pigment +. No granuoma. Non malignant  - last cxr 2019 and clear  HPI RASHEIDA BRODEN 87 y.o. -presents for follow-up.  Since last visit she has been doing well.  She has not had a Covid vaccine.  She continues social distancing.  She continues Breo.  She does not want to downsize the inhaler because of Covid.  She feels she needs extra protection.  She wants to maintain her health.  She is glad she has done well so far overall.  Her last chest x-ray was in 2019 she is willing to have another chest x-ray.  In terms of asthma asthma control question is 0 out of 5.  She not waking up in the middle of the night when she wakes up she has no problems she not having any activity limitation because of asthma.  She had wheezing because of asthma she not having any shortness of breath because of asthma she not using albuterol for rescue.     ROS - per HPI  OV 05/06/2020  Subjective:  Patient ID: Chyrel Masson, female , DOB: 07-18-1930 , age 22 y.o. , MRN: [id number] , ADDRESS: [address] [place] [address] PCP [name], MD Patient Care Team: [name], MD as PCP - General (Internal Medicine) Daryll Brod, MD as Consulting Physician (Orthopedic Surgery) [name], DO as Consulting Physician (Neurology)  This Provider for this visit: Treatment Team:  Attending Provider: Brand Males, MD    05/06/2020 -    Chief Complaint  Patient presents with  . Follow-up    No compaints   Female never smoker followed for mild persistent asthma  History of diastolic dysfunction  Known chronic right middle lobe collapse/ lymphadenopathy -s/p 07/2007 FOB +psuedomonas - MEdiastinaoscopy 07/27/2009 - Benign node of 4R and 4L. ANthracotoic pigment +. No granuoma. Non malignant  - last cxr 2019 and clear   HPI Zeenat Jeanbaptiste 87 y.o. -returns for follow-up for her mild persistent asthma.  She is doing well.  ACT control score is 25.  She was asking about a chest x-ray but I reminded her that in May 2021 she had a chest x-ray that was clear.  I showed her the film.  She was satisfied with that.  There are no new issues she wants refills on her inhalers.  She is

## 2020-05-13 DIAGNOSIS — R519 Headache, unspecified: Secondary | ICD-10-CM | POA: Diagnosis not present

## 2020-05-13 DIAGNOSIS — R002 Palpitations: Secondary | ICD-10-CM | POA: Diagnosis not present

## 2020-05-13 DIAGNOSIS — I10 Essential (primary) hypertension: Secondary | ICD-10-CM | POA: Diagnosis not present

## 2020-05-23 ENCOUNTER — Other Ambulatory Visit: Payer: Medicare PPO

## 2020-06-02 DIAGNOSIS — Z09 Encounter for follow-up examination after completed treatment for conditions other than malignant neoplasm: Secondary | ICD-10-CM | POA: Diagnosis not present

## 2020-06-02 DIAGNOSIS — Z8669 Personal history of other diseases of the nervous system and sense organs: Secondary | ICD-10-CM | POA: Diagnosis not present

## 2020-06-15 DIAGNOSIS — R519 Headache, unspecified: Secondary | ICD-10-CM | POA: Diagnosis not present

## 2020-06-15 DIAGNOSIS — G4452 New daily persistent headache (NDPH): Secondary | ICD-10-CM | POA: Diagnosis not present

## 2020-07-11 DIAGNOSIS — E611 Iron deficiency: Secondary | ICD-10-CM | POA: Diagnosis not present

## 2020-07-11 DIAGNOSIS — I700 Atherosclerosis of aorta: Secondary | ICD-10-CM | POA: Diagnosis not present

## 2020-07-11 DIAGNOSIS — I10 Essential (primary) hypertension: Secondary | ICD-10-CM | POA: Diagnosis not present

## 2020-07-11 DIAGNOSIS — Z23 Encounter for immunization: Secondary | ICD-10-CM | POA: Diagnosis not present

## 2020-07-18 ENCOUNTER — Other Ambulatory Visit: Payer: Self-pay

## 2020-07-18 ENCOUNTER — Encounter: Payer: Self-pay | Admitting: Neurology

## 2020-07-18 ENCOUNTER — Ambulatory Visit: Payer: Medicare PPO | Admitting: Neurology

## 2020-07-18 VITALS — BP 144/65 | HR 73 | Ht 67.0 in | Wt 163.0 lb

## 2020-07-18 DIAGNOSIS — G629 Polyneuropathy, unspecified: Secondary | ICD-10-CM

## 2020-07-18 NOTE — Patient Instructions (Signed)
Return to clinic in 1 year.

## 2020-07-18 NOTE — Progress Notes (Signed)
Follow-up Visit   Date: 08/05/20    Anne [name] MRN: [id number] DOB: 09-18-1930   Interim History: Anne [name] is a 87 y.o. right-handed African American female with asthma, GERD, hyperlipidemia returning the clinic for follow-up of bilateral feet pain.  The patient was accompanied to the clinic by self.  History of present illness: Since early 03/09/2017, she has intermittent stabbing pain over the tip of her left second toe, which occurs sporadically especially at night. Pain is worse with standing or wearing constrictive shoes.  She also complains of dull pain over the medial lower leg and ankle.  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  Unfortunately, she did not have any relief of her foot pain.  She has tried ibuprofen, Aleve, and gabapentin without any relief. MRI left foot which shows nonspecific soft tissue in the second toe with possible postsurgical changes in the flexor digitorum longus tendon. NCS/EMG was normal and did not show evidence of neuropathy or lumbosacral radiculopathy.   She does not have history of diabetes.  She denies any weakness or imbalance and continues to walk unassisted.  She lives alone in a one-level home; her husband passed away in 03/09/2017.   UPDATE Aug 05, 2020:  She is here for follow-up visit.  She was having stabbing headaches and her gabapentin was increased to 300mg  at bedtime by Dr. Neale Burly.  Her headaches are better.  Her neuropathy remains unchanged with pain in the left 2nd toe.  She has many questions about her prognosis with neuropathy and is frustrated that there is not curative treatment for neuropathy.   Medications:  Current Outpatient Medications on File Prior to Visit  Medication Sig Dispense Refill  . albuterol (PROAIR HFA) 108 (90 Base) MCG/ACT inhaler INHALE 1-2 PUFFS INTO THE LUNGS EVERY 6 (SIX) HOURS AS NEEDED FOR WHEEZING OR  SHORTNESS OF BREATH. 18 g 3  . Calcium Carb-Cholecalciferol 600-200 MG-UNIT TABS Take 2 tablets by mouth daily.     . cetirizine (ZYRTEC) 10 MG tablet Take 10 mg by mouth every morning.    . ferrous sulfate 325 (65 FE) MG tablet Take 325 mg by mouth daily with breakfast. Three times a week    . fluticasone (FLONASE) 50 MCG/ACT nasal spray Place 1 spray into both nostrils 2 (two) times daily as needed for allergies.     . fluticasone furoate-vilanterol (BREO ELLIPTA) 100-25 MCG/INH AEPB INHALE 1 PUFF BY MOUTH EVERY DAY 60 each 4  . gabapentin (NEURONTIN) 100 MG capsule Take 2 capsules (200 mg total) by mouth at bedtime. 180 capsule 3  . latanoprost (XALATAN) 0.005 % ophthalmic solution Place 1 drop into both eyes at bedtime.    . Multiple Vitamin (MULTIVITAMIN) tablet Take 1 tablet by mouth every morning.     . TURMERIC PO Take 1 capsule by mouth daily.     Current Facility-Administered Medications on File Prior to Visit  Medication Dose Route Frequency Provider Last Rate Last Admin  . regadenoson (LEXISCAN) injection SOLN 0.4 mg  0.4 mg Intravenous Once Quintella Reichert, MD        Allergies:  Allergies  Allergen Reactions  . Other Shortness Of Breath    Pt states she has asthma and seafood causes breathing problems.   . Shellfish Allergy Shortness Of Breath  . Iodine Other (See Comments)    REACTION: " UNSURE TOLD TO LIST AS ALLERGEN BY MD"  REACTION: " UNSURE TOLD TO LIST AS ALLERGEN BY MD"  . Latex Itching  . Adhesive [Tape] Rash     Vital Signs:  BP (!) 144/65   Pulse 73   Ht 5\' 7"  (1.702 m)   Wt 163 lb (73.9 kg)   SpO2 99%   BMI 25.53 kg/m     Neurological Exam: MENTAL STATUS including orientation to time, place, person, recent and remote memory, attention span and concentration, language, and fund of knowledge is normal.  Speech is not dysarthric.  CRANIAL NERVES: Extraocular muscle intact.  No ptosis   MOTOR:  Motor strength is 5/5 in all extremities, including  distally.  No atrophy, fasciculations or abnormal movements.  No pronator drift.  Tone is normal.    MSRs:  Reflexes are 2+/4 throughout, except absent bilateral Achilles.  SENSORY:  Vibration is absent at the great toe bilaterally, and intact above the ankles.  Rhomberg sign is absent  COORDINATION/GAIT:  Gait narrow based and stable. Tandem gait intact.   Data: NCS/EMG of the left lower extremity 04/11/2017:  This is a normal study of the left lower extremity. In particular, there is no evidence of a sensorimotor polyneuropathy or lumbosacral radiculopathy.  MRI left foot 02/19/2017: 1. Mild nonspecific soft tissue edema in the second toe with possible postsurgical changes in the flexor digitorum longus tendon. 2. No significant arthropathic changes or acute osseous findings in the second toe. There are mild hammertoe deformities of the digits.  3. Moderate degenerative changes at the first metatarsal phalangeal joint.  Lab Results  Component Value Date   VITAMINB12 1,489 (H) 12/13/2017   Lab Results  Component Value Date   FOLATE >24.1 12/13/2017     IMPRESSION/PLAN: 1.  Idiopathic peripheral neuropathy, mild sensory symptoms.  Stable  - Continue gabapentin 300mg  as prescribed by Dr. Neale Burly for stabbing headaches  - Continue home exercises for balance  - Patient had many questions which were addressed.    2. Bilateral carpal tunnel syndrome, mild  - Stable  Return to clinic in 1 year    Thank you for allowing me to participate in patient's care.  If I can answer any additional questions, I would be pleased to do so.    Sincerely,    Khan Chura K. Allena Katz, DO

## 2020-07-25 DIAGNOSIS — H0261 Xanthelasma of right upper eyelid: Secondary | ICD-10-CM | POA: Diagnosis not present

## 2020-07-25 DIAGNOSIS — H05223 Edema of bilateral orbit: Secondary | ICD-10-CM | POA: Diagnosis not present

## 2020-07-25 DIAGNOSIS — D485 Neoplasm of uncertain behavior of skin: Secondary | ICD-10-CM | POA: Diagnosis not present

## 2020-07-25 DIAGNOSIS — H02423 Myogenic ptosis of bilateral eyelids: Secondary | ICD-10-CM | POA: Diagnosis not present

## 2020-07-25 DIAGNOSIS — H0264 Xanthelasma of left upper eyelid: Secondary | ICD-10-CM | POA: Diagnosis not present

## 2020-08-19 DIAGNOSIS — G4489 Other headache syndrome: Secondary | ICD-10-CM | POA: Diagnosis not present

## 2020-08-19 DIAGNOSIS — I10 Essential (primary) hypertension: Secondary | ICD-10-CM | POA: Diagnosis not present

## 2020-08-30 DIAGNOSIS — J3489 Other specified disorders of nose and nasal sinuses: Secondary | ICD-10-CM | POA: Diagnosis not present

## 2020-08-30 DIAGNOSIS — Z9889 Other specified postprocedural states: Secondary | ICD-10-CM | POA: Diagnosis not present

## 2020-08-30 DIAGNOSIS — J329 Chronic sinusitis, unspecified: Secondary | ICD-10-CM | POA: Diagnosis not present

## 2020-09-06 DIAGNOSIS — G4452 New daily persistent headache (NDPH): Secondary | ICD-10-CM | POA: Diagnosis not present

## 2020-09-09 DIAGNOSIS — I10 Essential (primary) hypertension: Secondary | ICD-10-CM | POA: Diagnosis not present

## 2020-09-16 DIAGNOSIS — Z9889 Other specified postprocedural states: Secondary | ICD-10-CM | POA: Diagnosis not present

## 2020-09-16 DIAGNOSIS — R519 Headache, unspecified: Secondary | ICD-10-CM | POA: Diagnosis not present

## 2020-09-16 DIAGNOSIS — H8111 Benign paroxysmal vertigo, right ear: Secondary | ICD-10-CM | POA: Diagnosis not present

## 2020-09-16 DIAGNOSIS — J329 Chronic sinusitis, unspecified: Secondary | ICD-10-CM | POA: Diagnosis not present

## 2020-09-16 DIAGNOSIS — J3489 Other specified disorders of nose and nasal sinuses: Secondary | ICD-10-CM | POA: Diagnosis not present

## 2020-09-26 ENCOUNTER — Other Ambulatory Visit: Payer: Self-pay | Admitting: Ophthalmology

## 2020-09-26 DIAGNOSIS — H0261 Xanthelasma of right upper eyelid: Secondary | ICD-10-CM | POA: Diagnosis not present

## 2020-09-26 DIAGNOSIS — H02423 Myogenic ptosis of bilateral eyelids: Secondary | ICD-10-CM | POA: Diagnosis not present

## 2020-09-26 DIAGNOSIS — H0264 Xanthelasma of left upper eyelid: Secondary | ICD-10-CM | POA: Diagnosis not present

## 2020-09-26 DIAGNOSIS — H05223 Edema of bilateral orbit: Secondary | ICD-10-CM | POA: Diagnosis not present

## 2020-10-04 DIAGNOSIS — R3915 Urgency of urination: Secondary | ICD-10-CM | POA: Diagnosis not present

## 2020-10-04 DIAGNOSIS — R3121 Asymptomatic microscopic hematuria: Secondary | ICD-10-CM | POA: Diagnosis not present

## 2020-11-09 ENCOUNTER — Other Ambulatory Visit: Payer: Self-pay | Admitting: Obstetrics and Gynecology

## 2020-11-09 DIAGNOSIS — Z1231 Encounter for screening mammogram for malignant neoplasm of breast: Secondary | ICD-10-CM

## 2020-11-11 ENCOUNTER — Ambulatory Visit: Payer: Medicare PPO | Admitting: Neurology

## 2020-11-16 DIAGNOSIS — M25511 Pain in right shoulder: Secondary | ICD-10-CM | POA: Diagnosis not present

## 2020-11-28 ENCOUNTER — Ambulatory Visit: Payer: Medicare PPO | Admitting: Neurology

## 2020-12-06 DIAGNOSIS — Z01411 Encounter for gynecological examination (general) (routine) with abnormal findings: Secondary | ICD-10-CM | POA: Diagnosis not present

## 2020-12-06 DIAGNOSIS — Z1231 Encounter for screening mammogram for malignant neoplasm of breast: Secondary | ICD-10-CM | POA: Diagnosis not present

## 2020-12-06 DIAGNOSIS — Z1211 Encounter for screening for malignant neoplasm of colon: Secondary | ICD-10-CM | POA: Diagnosis not present

## 2020-12-06 DIAGNOSIS — M8580 Other specified disorders of bone density and structure, unspecified site: Secondary | ICD-10-CM | POA: Diagnosis not present

## 2020-12-06 DIAGNOSIS — H8111 Benign paroxysmal vertigo, right ear: Secondary | ICD-10-CM | POA: Diagnosis not present

## 2020-12-08 ENCOUNTER — Other Ambulatory Visit: Payer: Self-pay

## 2020-12-08 ENCOUNTER — Encounter: Payer: Self-pay | Admitting: Neurology

## 2020-12-08 ENCOUNTER — Ambulatory Visit: Payer: Medicare PPO | Admitting: Neurology

## 2020-12-08 VITALS — BP 116/41 | HR 66 | Ht 67.0 in | Wt 162.0 lb

## 2020-12-08 DIAGNOSIS — G629 Polyneuropathy, unspecified: Secondary | ICD-10-CM | POA: Diagnosis not present

## 2020-12-08 MED ORDER — GABAPENTIN 300 MG PO CAPS: 300.0000 mg | ORAL_CAPSULE | Freq: Every day | ORAL | 3 refills | Status: DC

## 2020-12-08 NOTE — Progress Notes (Signed)
Follow-up Visit   Date: 12/08/20    Anne [name] MRN: [id number] DOB: Jul 13, 1930   Interim History: Anne [name] is a 87 y.o. right-handed African American female with asthma, GERD, hyperlipidemia returning the clinic for follow-up of bilateral feet pain.  The patient was accompanied to the clinic by self.  History of present illness: Since early 2017/02/26, she has intermittent stabbing pain over the tip of her left second toe, which occurs sporadically especially at night. Pain is worse with standing or wearing constrictive shoes.  She also complains of dull pain over the medial lower leg and ankle.  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  Unfortunately, she did not have any relief of her foot pain.  She has tried ibuprofen, Aleve, and gabapentin without any relief. MRI left foot which shows nonspecific soft tissue in the second toe with possible postsurgical changes in the flexor digitorum longus tendon. NCS/EMG was normal and did not show evidence of neuropathy or lumbosacral radiculopathy.    She does not have history of diabetes.  She denies any weakness or imbalance and continues to walk unassisted.  She lives alone in a one-level home; her husband passed away in 02/26/2017.   UPDATE 25-Jul-2020:  She is here for follow-up visit.  She was having stabbing headaches and her gabapentin was increased to 300mg  at bedtime by Dr. Neale Burly.  Her headaches are better.  Her neuropathy remains unchanged with pain in the left 2nd toe.  She has many questions about her prognosis with neuropathy and is frustrated that there is not curative treatment for neuropathy.   UPDATE 12/08/2020:  She is here for follow-up visit.  She continues to take gabapentin 300mg  daily.  She is concerned that it is making her vertigo worse.  She denies significant pain in the feet and legs. Sometimes, she feel the numbness is  worse in the legs.  No interval falls. Balance is fair.  She walks unassisted.  She had many questions about her neuropathy, prognosis, and management.   Medications:  Current Outpatient Medications on File Prior to Visit  Medication Sig Dispense Refill   albuterol (PROAIR HFA) 108 (90 Base) MCG/ACT inhaler INHALE 1-2 PUFFS INTO THE LUNGS EVERY 6 (SIX) HOURS AS NEEDED FOR WHEEZING OR SHORTNESS OF BREATH. 18 g 3   Calcium Carb-Cholecalciferol 600-200 MG-UNIT TABS Take 2 tablets by mouth daily.      cetirizine (ZYRTEC) 10 MG tablet Take 10 mg by mouth every morning.     ferrous sulfate 325 (65 FE) MG tablet Take 325 mg by mouth daily with breakfast. Three times a week     fluticasone (FLONASE) 50 MCG/ACT nasal spray Place 1 spray into both nostrils 2 (two) times daily as needed for allergies.      fluticasone furoate-vilanterol (BREO ELLIPTA) 100-25 MCG/INH AEPB INHALE 1 PUFF BY MOUTH EVERY DAY 60 each 4   gabapentin (NEURONTIN) 100 MG capsule Take 2 capsules (200 mg total) by mouth at bedtime. (Patient taking differently: Take 300 mg by mouth at bedtime. 300mg  once a day) 180 capsule 3   latanoprost (XALATAN) 0.005 % ophthalmic solution Place 1 drop into both eyes at bedtime.     Multiple Vitamin (MULTIVITAMIN) tablet Take 1 tablet by mouth every morning.      TURMERIC PO Take 1 capsule by mouth daily.     Current Facility-Administered Medications on File Prior to  Visit  Medication Dose Route Frequency Provider Last Rate Last Admin   regadenoson (LEXISCAN) injection SOLN 0.4 mg  0.4 mg Intravenous Once Quintella Reichert, MD        Allergies:  Allergies  Allergen Reactions   Other Shortness Of Breath    Pt states she has asthma and seafood causes breathing problems.    Shellfish Allergy Shortness Of Breath   Iodine Other (See Comments)    REACTION: " UNSURE TOLD TO LIST AS ALLERGEN BY MD" REACTION: " UNSURE TOLD TO LIST AS ALLERGEN BY MD"   Latex Itching   Adhesive [Tape] Rash      Vital Signs:  BP (!) 116/41   Pulse 66   Ht 5\' 7"  (1.702 m)   Wt 162 lb (73.5 kg)   SpO2 99%   BMI 25.37 kg/m     Neurological Exam: MENTAL STATUS including orientation to time, place, person, recent and remote memory, attention span and concentration, language, and fund of knowledge is normal.  Speech is not dysarthric.  CRANIAL NERVES: Extraocular muscle intact.  No ptosis   MOTOR:  Motor strength is 5/5 in all extremities, including distally.  No atrophy, fasciculations or abnormal movements.  No pronator drift.  Tone is normal.    MSRs:  Reflexes are 2+/4 throughout, except absent bilateral Achilles.  SENSORY:  Vibration is absent at the great toe bilaterally, and intact above the ankles.  Rhomberg sign is absent  COORDINATION/GAIT:  Gait narrow based and stable, unassisted.  Data: NCS/EMG of the left lower extremity 04/11/2017:  This is a normal study of the left lower extremity. In particular, there is no evidence of a sensorimotor polyneuropathy or lumbosacral radiculopathy.   MRI left foot 02/19/2017: 1. Mild nonspecific soft tissue edema in the second toe with possible postsurgical changes in the flexor digitorum longus tendon. 2. No significant arthropathic changes or acute osseous findings in the second toe. There are mild hammertoe deformities of the digits.  3. Moderate degenerative changes at the first metatarsal phalangeal joint.  Lab Results  Component Value Date   VITAMINB12 1,489 (H) 12/13/2017   Lab Results  Component Value Date   FOLATE >24.1 12/13/2017     IMPRESSION/PLAN: 1.  Idiopathic peripheral neuropathy, mild sensory symptoms.  Stable  - Continue gabapentin 300mg  at bedtime.  Refills provided.  Unlikely at such a low dose that this is contributing to vertigo  - Continue home balance exercises  - Patient educated on daily foot inspection, fall prevention, and safety precautions around the home.  - She had many questions about her  neuropathy and prognosis, which I again addressed  2. Bilateral carpal tunnel syndrome, mild  - Stable  Return to clinic in 1 year  Total time spent reviewing records, interview, history/exam, documentation, and coordination of care on day of encounter:  25 min    Thank you for allowing me to participate in patient's care.  If I can answer any additional questions, I would be pleased to do so.    Sincerely,    Scotland Dost K. Allena Katz, DO

## 2020-12-08 NOTE — Patient Instructions (Addendum)
Continue gabapentin '300mg'$  at bedtime  Return to clinic in 1 year

## 2020-12-19 DIAGNOSIS — M25511 Pain in right shoulder: Secondary | ICD-10-CM | POA: Diagnosis not present

## 2020-12-21 DIAGNOSIS — R269 Unspecified abnormalities of gait and mobility: Secondary | ICD-10-CM | POA: Diagnosis not present

## 2020-12-21 DIAGNOSIS — G629 Polyneuropathy, unspecified: Secondary | ICD-10-CM | POA: Diagnosis not present

## 2020-12-21 DIAGNOSIS — R42 Dizziness and giddiness: Secondary | ICD-10-CM | POA: Diagnosis not present

## 2020-12-21 DIAGNOSIS — I10 Essential (primary) hypertension: Secondary | ICD-10-CM | POA: Diagnosis not present

## 2020-12-21 DIAGNOSIS — Z7722 Contact with and (suspected) exposure to environmental tobacco smoke (acute) (chronic): Secondary | ICD-10-CM | POA: Diagnosis not present

## 2020-12-21 DIAGNOSIS — J45909 Unspecified asthma, uncomplicated: Secondary | ICD-10-CM | POA: Diagnosis not present

## 2020-12-21 DIAGNOSIS — H409 Unspecified glaucoma: Secondary | ICD-10-CM | POA: Diagnosis not present

## 2020-12-21 DIAGNOSIS — M8580 Other specified disorders of bone density and structure, unspecified site: Secondary | ICD-10-CM | POA: Diagnosis not present

## 2020-12-21 DIAGNOSIS — D649 Anemia, unspecified: Secondary | ICD-10-CM | POA: Diagnosis not present

## 2020-12-28 ENCOUNTER — Other Ambulatory Visit: Payer: Self-pay

## 2020-12-28 ENCOUNTER — Ambulatory Visit
Admission: RE | Admit: 2020-12-28 | Discharge: 2020-12-28 | Disposition: A | Discharge status: Home / Self Care | Payer: Medicare PPO | Source: Ambulatory Visit | Attending: Obstetrics and Gynecology | Admitting: Obstetrics and Gynecology

## 2020-12-28 DIAGNOSIS — Z1231 Encounter for screening mammogram for malignant neoplasm of breast: Secondary | ICD-10-CM | POA: Diagnosis not present

## 2020-12-29 DIAGNOSIS — R001 Bradycardia, unspecified: Secondary | ICD-10-CM | POA: Diagnosis not present

## 2021-01-06 DIAGNOSIS — H0263 Xanthelasma of right eye, unspecified eyelid: Secondary | ICD-10-CM | POA: Diagnosis not present

## 2021-01-06 DIAGNOSIS — H401121 Primary open-angle glaucoma, left eye, mild stage: Secondary | ICD-10-CM | POA: Diagnosis not present

## 2021-01-06 DIAGNOSIS — H401112 Primary open-angle glaucoma, right eye, moderate stage: Secondary | ICD-10-CM | POA: Diagnosis not present

## 2021-01-06 DIAGNOSIS — H04123 Dry eye syndrome of bilateral lacrimal glands: Secondary | ICD-10-CM | POA: Diagnosis not present

## 2021-01-13 DIAGNOSIS — I10 Essential (primary) hypertension: Secondary | ICD-10-CM | POA: Diagnosis not present

## 2021-01-13 DIAGNOSIS — G609 Hereditary and idiopathic neuropathy, unspecified: Secondary | ICD-10-CM | POA: Diagnosis not present

## 2021-01-13 DIAGNOSIS — E7800 Pure hypercholesterolemia, unspecified: Secondary | ICD-10-CM | POA: Diagnosis not present

## 2021-01-13 DIAGNOSIS — Z23 Encounter for immunization: Secondary | ICD-10-CM | POA: Diagnosis not present

## 2021-01-13 DIAGNOSIS — Z79899 Other long term (current) drug therapy: Secondary | ICD-10-CM | POA: Diagnosis not present

## 2021-01-13 DIAGNOSIS — Z0000 Encounter for general adult medical examination without abnormal findings: Secondary | ICD-10-CM | POA: Diagnosis not present

## 2021-01-13 DIAGNOSIS — J4530 Mild persistent asthma, uncomplicated: Secondary | ICD-10-CM | POA: Diagnosis not present

## 2021-01-13 DIAGNOSIS — I700 Atherosclerosis of aorta: Secondary | ICD-10-CM | POA: Diagnosis not present

## 2021-01-16 ENCOUNTER — Other Ambulatory Visit: Payer: Self-pay | Admitting: Internal Medicine

## 2021-01-16 DIAGNOSIS — H8111 Benign paroxysmal vertigo, right ear: Secondary | ICD-10-CM | POA: Diagnosis not present

## 2021-01-23 DIAGNOSIS — R001 Bradycardia, unspecified: Secondary | ICD-10-CM | POA: Diagnosis not present

## 2021-01-31 DIAGNOSIS — R001 Bradycardia, unspecified: Secondary | ICD-10-CM | POA: Diagnosis not present

## 2021-02-08 DIAGNOSIS — N183 Chronic kidney disease, stage 3 unspecified: Secondary | ICD-10-CM | POA: Diagnosis not present

## 2021-02-10 ENCOUNTER — Ambulatory Visit: Payer: Medicare PPO | Admitting: Neurology

## 2021-02-13 DIAGNOSIS — H8110 Benign paroxysmal vertigo, unspecified ear: Secondary | ICD-10-CM | POA: Diagnosis not present

## 2021-02-20 ENCOUNTER — Other Ambulatory Visit: Payer: Self-pay

## 2021-02-20 ENCOUNTER — Emergency Department (HOSPITAL_COMMUNITY): Payer: Medicare PPO

## 2021-02-20 ENCOUNTER — Observation Stay (HOSPITAL_COMMUNITY)
Admission: EM | Admit: 2021-02-20 | Discharge: 2021-02-21 | Disposition: A | Discharge status: Home / Self Care | Payer: Medicare PPO | Attending: Internal Medicine | Admitting: Internal Medicine

## 2021-02-20 ENCOUNTER — Emergency Department (HOSPITAL_BASED_OUTPATIENT_CLINIC_OR_DEPARTMENT_OTHER): Payer: Medicare PPO

## 2021-02-20 DIAGNOSIS — I498 Other specified cardiac arrhythmias: Secondary | ICD-10-CM

## 2021-02-20 DIAGNOSIS — R0902 Hypoxemia: Secondary | ICD-10-CM | POA: Diagnosis not present

## 2021-02-20 DIAGNOSIS — K219 Gastro-esophageal reflux disease without esophagitis: Secondary | ICD-10-CM | POA: Insufficient documentation

## 2021-02-20 DIAGNOSIS — I2582 Chronic total occlusion of coronary artery: Secondary | ICD-10-CM | POA: Diagnosis not present

## 2021-02-20 DIAGNOSIS — R002 Palpitations: Secondary | ICD-10-CM

## 2021-02-20 DIAGNOSIS — Z91040 Latex allergy status: Secondary | ICD-10-CM | POA: Diagnosis not present

## 2021-02-20 DIAGNOSIS — Z79899 Other long term (current) drug therapy: Secondary | ICD-10-CM | POA: Diagnosis not present

## 2021-02-20 DIAGNOSIS — R55 Syncope and collapse: Principal | ICD-10-CM | POA: Insufficient documentation

## 2021-02-20 DIAGNOSIS — Z87891 Personal history of nicotine dependence: Secondary | ICD-10-CM | POA: Insufficient documentation

## 2021-02-20 DIAGNOSIS — F419 Anxiety disorder, unspecified: Secondary | ICD-10-CM | POA: Insufficient documentation

## 2021-02-20 DIAGNOSIS — I493 Ventricular premature depolarization: Secondary | ICD-10-CM | POA: Diagnosis present

## 2021-02-20 DIAGNOSIS — J449 Chronic obstructive pulmonary disease, unspecified: Secondary | ICD-10-CM | POA: Insufficient documentation

## 2021-02-20 DIAGNOSIS — R42 Dizziness and giddiness: Secondary | ICD-10-CM | POA: Diagnosis not present

## 2021-02-20 DIAGNOSIS — R5383 Other fatigue: Secondary | ICD-10-CM | POA: Insufficient documentation

## 2021-02-20 DIAGNOSIS — R079 Chest pain, unspecified: Secondary | ICD-10-CM | POA: Diagnosis not present

## 2021-02-20 DIAGNOSIS — I700 Atherosclerosis of aorta: Secondary | ICD-10-CM | POA: Insufficient documentation

## 2021-02-20 DIAGNOSIS — R001 Bradycardia, unspecified: Secondary | ICD-10-CM | POA: Insufficient documentation

## 2021-02-20 DIAGNOSIS — I10 Essential (primary) hypertension: Secondary | ICD-10-CM

## 2021-02-20 DIAGNOSIS — Z20822 Contact with and (suspected) exposure to covid-19: Secondary | ICD-10-CM | POA: Insufficient documentation

## 2021-02-20 DIAGNOSIS — R0602 Shortness of breath: Secondary | ICD-10-CM | POA: Diagnosis not present

## 2021-02-20 DIAGNOSIS — R0789 Other chest pain: Secondary | ICD-10-CM | POA: Insufficient documentation

## 2021-02-20 DIAGNOSIS — I2510 Atherosclerotic heart disease of native coronary artery without angina pectoris: Secondary | ICD-10-CM | POA: Insufficient documentation

## 2021-02-20 DIAGNOSIS — R008 Other abnormalities of heart beat: Secondary | ICD-10-CM | POA: Diagnosis not present

## 2021-02-20 LAB — CBC
HCT: 36.1 % (ref 36.0–46.0)
Hemoglobin: 11.7 g/dL — ABNORMAL LOW (ref 12.0–15.0)
MCH: 30.7 pg (ref 26.0–34.0)
MCHC: 32.4 g/dL (ref 30.0–36.0)
MCV: 94.8 fL (ref 80.0–100.0)
Platelets: 297 10*3/uL (ref 150–400)
RBC: 3.81 MIL/uL — ABNORMAL LOW (ref 3.87–5.11)
RDW: 13.6 % (ref 11.5–15.5)
WBC: 5 10*3/uL (ref 4.0–10.5)
nRBC: 0 % (ref 0.0–0.2)

## 2021-02-20 LAB — ECHOCARDIOGRAM COMPLETE
AR max vel: 2.47 cm2
AV Area VTI: 2.26 cm2
AV Area mean vel: 2.16 cm2
AV Mean grad: 4 mmHg
AV Peak grad: 5.9 mmHg
Ao pk vel: 1.21 m/s
Area-P 1/2: 2.75 cm2
Height: 67 in
S' Lateral: 2.8 cm
Weight: 2528 oz

## 2021-02-20 LAB — URINALYSIS, ROUTINE W REFLEX MICROSCOPIC
Bacteria, UA: NONE SEEN
Bilirubin Urine: NEGATIVE
Glucose, UA: NEGATIVE mg/dL
Ketones, ur: NEGATIVE mg/dL
Leukocytes,Ua: NEGATIVE
Nitrite: NEGATIVE
Protein, ur: NEGATIVE mg/dL
Specific Gravity, Urine: 1.006 (ref 1.005–1.030)
pH: 8 (ref 5.0–8.0)

## 2021-02-20 LAB — BASIC METABOLIC PANEL
Anion gap: 7 (ref 5–15)
BUN: 17 mg/dL (ref 8–23)
CO2: 25 mmol/L (ref 22–32)
Calcium: 9.1 mg/dL (ref 8.9–10.3)
Chloride: 107 mmol/L (ref 98–111)
Creatinine, Ser: 1.12 mg/dL — ABNORMAL HIGH (ref 0.44–1.00)
GFR, Estimated: 47 mL/min — ABNORMAL LOW (ref >60)
Glucose, Bld: 112 mg/dL — ABNORMAL HIGH (ref 70–99)
Potassium: 4.3 mmol/L (ref 3.5–5.1)
Sodium: 139 mmol/L (ref 135–145)

## 2021-02-20 LAB — TSH: TSH: 2.37 u[IU]/mL (ref 0.350–4.500)

## 2021-02-20 LAB — MAGNESIUM: Magnesium: 2 mg/dL (ref 1.7–2.4)

## 2021-02-20 LAB — TROPONIN I (HIGH SENSITIVITY)
Troponin I (High Sensitivity): 7 ng/L (ref <18)
Troponin I (High Sensitivity): 6 ng/L (ref <18)

## 2021-02-20 MED ORDER — NITROGLYCERIN 0.4 MG SL SUBL: 0.4000 mg | SUBLINGUAL_TABLET | SUBLINGUAL | Status: DC | PRN

## 2021-02-20 MED ORDER — ADULT MULTIVITAMIN W/MINERALS CH
1.0000 | ORAL_TABLET | Freq: Every morning | ORAL | Status: DC
  Administered 2021-02-21: 1 via ORAL
  Filled 2021-02-20: qty 1

## 2021-02-20 MED ORDER — LATANOPROST 0.005 % OP SOLN
1.0000 [drp] | Freq: Every day | OPHTHALMIC | Status: DC
  Filled 2021-02-20: qty 2.5

## 2021-02-20 MED ORDER — OYSTER SHELL CALCIUM/D3 500-5 MG-MCG PO TABS
1.0000 | ORAL_TABLET | Freq: Every day | ORAL | Status: DC
  Administered 2021-02-21: 1 via ORAL
  Filled 2021-02-20 (×3): qty 1

## 2021-02-20 MED ORDER — FLUTICASONE FUROATE-VILANTEROL 100-25 MCG/ACT IN AEPB
1.0000 | INHALATION_SPRAY | Freq: Every day | RESPIRATORY_TRACT | Status: DC
  Administered 2021-02-21: 1 via RESPIRATORY_TRACT
  Filled 2021-02-20 (×2): qty 28

## 2021-02-20 MED ORDER — ALBUTEROL SULFATE (2.5 MG/3ML) 0.083% IN NEBU: 3.0000 mL | INHALATION_SOLUTION | Freq: Four times a day (QID) | RESPIRATORY_TRACT | Status: DC | PRN

## 2021-02-20 MED ORDER — GABAPENTIN 300 MG PO CAPS
300.0000 mg | ORAL_CAPSULE | Freq: Every day | ORAL | Status: DC
  Administered 2021-02-20: 300 mg via ORAL
  Filled 2021-02-20: qty 1

## 2021-02-20 MED ORDER — LORATADINE 10 MG PO TABS
10.0000 mg | ORAL_TABLET | Freq: Every day | ORAL | Status: DC
  Administered 2021-02-21: 10 mg via ORAL
  Filled 2021-02-20 (×2): qty 1

## 2021-02-20 MED ORDER — METOPROLOL TARTRATE 25 MG PO TABS
25.0000 mg | ORAL_TABLET | Freq: Two times a day (BID) | ORAL | Status: DC
  Administered 2021-02-20 – 2021-02-21 (×2): 25 mg via ORAL
  Filled 2021-02-20 (×2): qty 1

## 2021-02-20 MED ORDER — FLUTICASONE PROPIONATE 50 MCG/ACT NA SUSP
1.0000 | Freq: Two times a day (BID) | NASAL | Status: DC | PRN
  Filled 2021-02-20: qty 16

## 2021-02-20 NOTE — Progress Notes (Signed)
2D echocardiogram completed.  02/20/2021 4:00 PM Kelby Aline., MHA, RVT, RDCS, RDMS

## 2021-02-20 NOTE — ED Notes (Signed)
Ambulated to restroom without assistance. Requested wash cloths, tooth brush, and tooth paste

## 2021-02-20 NOTE — ED Notes (Signed)
Pt back in NSR at this time without intervention, reports relief of symptoms. Second troponin sent.

## 2021-02-20 NOTE — ED Notes (Signed)
Received verbal report from Cyndia Skeeters RN at this time

## 2021-02-20 NOTE — ED Notes (Signed)
Pt sitting up in stretcher, NSR on the monitor, VSS, no distress noted. Call light in reach.

## 2021-02-20 NOTE — ED Provider Notes (Signed)
[facility] EMERGENCY DEPARTMENT Provider Note   CSN: [id number] Arrival date & time: 02/20/21  7829     History Chief Complaint  Anne [name] presents with   Dizziness    Anne Anne [name] is a 87 y.o. female.  Pt presents to Anne ED today with chest discomfort.  She feels like there is pressure on her chest, "but it is not a pain."  Pt was found to be in bigeminy for EMS.  Anne bigeminy resolved with oxygen.  Pt was given asa by EMS, but no nitro.  Pt denies any sob or n/v.  She said sx have been going on for 3 weeks.   HPI: A 87 year old Anne [name] with a history of hypertension and hypercholesterolemia presents for evaluation of chest pain. Initial onset of pain was less than one hour ago. Anne Anne [name]'s chest pain is described as heaviness/pressure/tightness and is not worse with exertion. Anne Anne [name]'s chest pain is middle- or left-sided, is not well-localized, is not sharp and does not radiate to Anne arms/jaw/neck. Anne Anne [name] does not complain of nausea and denies diaphoresis. Anne Anne [name] has no history of stroke, has no history of peripheral artery disease, has not smoked in Anne past 90 days, denies any history of treated diabetes, has no relevant family history of coronary artery disease (first degree relative at less than age 3) and does not have an elevated BMI (>=30).   Past Medical History:  Diagnosis Date   Allergic rhinitis    Arthritis    Asthma    Atypical chest pain    Collapse of right lung    Dyslipidemia    Gastroesophageal reflux disease    Glaucoma    Hematuria    Hyperlipidemia    Lung nodules     Anne [name] Active Problem List   Diagnosis Date Noted   Arthritis 04/08/2018   Glaucoma 04/08/2018   Herpes simplex type 2 infection 04/08/2018   Uterine leiomyoma 04/08/2018   Non-recurrent acute suppurative otitis media of left ear with spontaneous rupture of tympanic membrane 01/10/2018   Pain 05/29/2017   Throat pain in adult 05/28/2017    Thrush of mouth and esophagus ([place]) 05/28/2017   Acute bacterial sinusitis 10/10/2016   De Quervain's syndrome (tenosynovitis) 01/25/2016   Primary osteoarthritis of first carpometacarpal joint of right hand 01/25/2016   Trigger finger of right thumb 01/25/2016   Essential hypertension 56/21/3086   Diastolic dysfunction 57/84/6962   Dysphagia 01/04/2015   Chest tightness 11/16/2014   Dyspnea 11/15/2014   COPD GOLD I 11/02/2014   GERD (gastroesophageal reflux disease) 11/02/2014   Proptosis 05/31/2014   Glaucoma, bilateral 05/31/2014   Ptosis of both eyelids 05/31/2014   Xanthelasma of left upper eyelid 05/31/2014   Pain in limb 08/18/2012   Routine gynecological examination 07/24/2011   Environmental allergies 07/24/2011   Osteoporosis 07/24/2011   Nocturia 07/24/2011   Asthma 07/04/2007   PULMONARY COLLAPSE 07/04/2007   PULMONARY NODULE 07/04/2007    Past Surgical History:  Procedure Laterality Date   ANAL FISSURE REPAIR     BREAST BIOPSY Right    CATARACT EXTRACTION Bilateral    MASS EXCISION  04/11/2012   Procedure: EXCISION MASS;  Surgeon: Wynonia Sours, MD;  Location: [facility];  Service: Orthopedics;  Laterality: Right;  Excision of mass right upper arm   MEDIASTINOSCOPY  4/11   NASAL SINUS SURGERY     TOTAL ABDOMINAL HYSTERECTOMY W/ BILATERAL SALPINGOOPHORECTOMY       OB History  No obstetric history on file.     Family History  Problem Relation Age of Onset   Cancer Mother    Heart disease Father    Breast cancer Cousin    Heart attack Neg Hx     Social History   Tobacco Use   Smoking status: Former    Years: 4.00    Types: Cigarettes    Start date: 1953    Quit date: 1960    Years since quitting: 62.9   Smokeless tobacco: Never   Tobacco comments:    social smoker  Scientific laboratory technician Use: Never used  Substance Use Topics   Alcohol use: No    Alcohol/week: 0.0 standard drinks   Drug use: No    Home Medications Prior to  Admission medications   Medication Sig Start Date End Date Taking? Authorizing Provider  albuterol (PROAIR HFA) 108 (90 Base) MCG/ACT inhaler INHALE 1-2 PUFFS INTO Anne LUNGS EVERY 6 (SIX) HOURS AS NEEDED FOR WHEEZING OR SHORTNESS OF BREATH. 05/06/20  Yes Brand Males, MD  amLODipine (NORVASC) 5 MG tablet Take 5 mg by mouth daily. 12/26/20  Yes [provider]  BREO ELLIPTA 100-25 MCG/INH AEPB INHALE 1 PUFF BY MOUTH EVERY DAY Anne [name] taking differently: 1 puff daily. INHALE 1 PUFF BY MOUTH EVERY DAY 01/16/21  Yes Brand Males, MD  Calcium Carb-Cholecalciferol 600-200 MG-UNIT TABS Take 1 tablet by mouth daily.   Yes [provider]  cetirizine (ZYRTEC) 10 MG tablet Take 10 mg by mouth every morning.   Yes [provider]  ferrous sulfate 325 (65 FE) MG tablet Take 325 mg by mouth 3 (three) times a week. Take on MWF   Yes [provider]  fluticasone (FLONASE) 50 MCG/ACT nasal spray Place 1 spray into both nostrils 2 (two) times daily as needed for allergies.    Yes [provider]  gabapentin (NEURONTIN) 300 MG capsule Take 1 capsule (300 mg total) by mouth daily. Anne [name] taking differently: Take 300 mg by mouth at bedtime. 12/08/20  Yes Patel, Donika K, DO  latanoprost (XALATAN) 0.005 % ophthalmic solution Place 1 drop into both eyes at bedtime.   Yes [provider]  Multiple Vitamin (MULTIVITAMIN) tablet Take 1 tablet by mouth every morning.    Yes [provider]  TURMERIC PO Take 1 capsule by mouth daily.   Yes [provider]    Allergies    Other, Shellfish allergy, Iodine, Latex, and Adhesive [tape]  Review of Systems   Review of Systems  Cardiovascular:  Positive for chest pain.  All other systems reviewed and are negative.  Physical Exam Updated Vital Signs BP (!) 154/70   Pulse (!) 103   Temp 98.6 F (37 C) (Oral)   Resp 17   Ht 5\' 7"  (1.702 m)   Wt 71.7 kg   SpO2 91%   BMI 24.75 kg/m    Physical Exam Vitals and nursing note reviewed.  Constitutional:      Appearance: Normal appearance.  HENT:     Head: Normocephalic and atraumatic.     Right Ear: External ear normal.     Left Ear: External ear normal.     Nose: Nose normal.     Mouth/Throat:     Mouth: Mucous membranes are moist.     Pharynx: Oropharynx is clear.  Eyes:     Conjunctiva/sclera: Conjunctivae normal.     Pupils: Pupils are equal, round, and reactive to light.  Cardiovascular:  Rate and Rhythm: Normal rate and regular rhythm.     Pulses: Normal pulses.     Heart sounds: Normal heart sounds.  Pulmonary:     Effort: Pulmonary effort is normal.     Breath sounds: Normal breath sounds.  Abdominal:     General: Abdomen is flat. Bowel sounds are normal.     Palpations: Abdomen is soft.  Musculoskeletal:        General: Normal range of motion.     Cervical back: Normal range of motion and neck supple.  Skin:    General: Skin is warm.     Capillary Refill: Capillary refill takes less than 2 seconds.  Neurological:     General: No focal deficit present.     Mental Status: She is alert and oriented to person, place, and time.  Psychiatric:        Mood and Affect: Mood normal.        Behavior: Behavior normal.    ED Results / Procedures / Treatments   Labs (all labs ordered are listed, but only abnormal results are displayed) Labs Reviewed  BASIC METABOLIC PANEL - Abnormal; Notable for Anne following components:      Result Value   Glucose, Bld 112 (*)    Creatinine, Ser 1.12 (*)    GFR, Estimated 47 (*)    All other components within normal limits  CBC - Abnormal; Notable for Anne following components:   RBC 3.81 (*)    Hemoglobin 11.7 (*)    All other components within normal limits  URINALYSIS, ROUTINE W REFLEX MICROSCOPIC - Abnormal; Notable for Anne following components:   Color, Urine STRAW (*)    Hgb urine dipstick SMALL (*)    All other components within normal limits  TSH   MAGNESIUM  TROPONIN I (HIGH SENSITIVITY)  TROPONIN I (HIGH SENSITIVITY)    EKG EKG Interpretation  Date/Time:  Monday February 20 2021 11:26:44 EST Ventricular Rate:  96 PR Interval:  195 QRS Duration: 74 QT Interval:  372 QTC Calculation: 353 R Axis:   75 Text Interpretation: Sinus rhythm Ventricular bigeminy Anteroseptal infarct, age indeterminate bigeminy is new Confirmed by Isla Pence [id number]) on 02/20/2021 12:17:01 PM  Radiology DG Chest Port 1 View  Result Date: 02/20/2021 CLINICAL DATA:  Chest pain EXAM: PORTABLE CHEST 1 VIEW COMPARISON:  08/17/2019 FINDINGS: Anne heart size and mediastinal contours are within normal limits. Aortic atherosclerosis. No focal airspace consolidation, pleural effusion, or pneumothorax. Anne visualized skeletal structures are unremarkable. IMPRESSION: No active disease. Electronically Signed   By: Davina Poke D.O.   On: 02/20/2021 11:08    Procedures Procedures   Medications Ordered in ED Medications  nitroGLYCERIN (NITROSTAT) SL tablet 0.4 mg (has no administration in time range)  albuterol (VENTOLIN HFA) 108 (90 Base) MCG/ACT inhaler 1-2 puff (has no administration in time range)  fluticasone furoate-vilanterol (BREO ELLIPTA) 100-25 MCG/ACT 1 puff (has no administration in time range)  Calcium Carb-Cholecalciferol 600-200 MG-UNIT TABS 1 tablet (has no administration in time range)  loratadine (CLARITIN) tablet 10 mg (has no administration in time range)  fluticasone (FLONASE) 50 MCG/ACT nasal spray 1 spray (has no administration in time range)  gabapentin (NEURONTIN) capsule 300 mg (has no administration in time range)  latanoprost (XALATAN) 0.005 % ophthalmic solution 1 drop (has no administration in time range)  multivitamin tablet 1 tablet (has no administration in time range)    ED Course  I have reviewed Anne triage vital signs and Anne nursing notes.  Pertinent labs & imaging results that were available during my care of Anne  Anne [name] were reviewed by me and considered in my medical decision making (see chart for details).    MDM Rules/Calculators/A&P HEAR Score: 4                         Troponins times 2 are negative.  Pt did go back into bigeminy while here.  She c/o dizziness and palpitations during this episode.  PVCs did not seem to be perfused.  Pt d/w cardiology.  They will admit for further eval.  Final Clinical Impression(s) / ED Diagnoses Final diagnoses:  Bradycardia  Ventricular bigeminy    Rx / DC Orders ED Discharge Orders     None        Isla Pence, MD 02/20/21 1438

## 2021-02-20 NOTE — ED Triage Notes (Signed)
Pt reports intermittent dizziness/anxiety feeling in chest x3 weeks. Became persistent today so pt called EMS. EMS found pt to be in bigeminy with PVCs not perfusing, rate in the 40s peripherally. Pt went from bigeminy into NSR in the 80s for EMS. Otherwise hemodynamically stable, GCS 15

## 2021-02-20 NOTE — ED Notes (Signed)
Pt states she took home meds this AM and did not want vitamins, claritin, or inhaler. Pt resting in stretcher in no distress, call light in reach. Meal tray ordered. Pt has ambulated to the bathroom several times with assistance to void, no dizziness noted and steady gait.

## 2021-02-20 NOTE — H&P (Addendum)
Cardiology History and Physical:   Patient ID: Anne [name] MRN: [id number]; DOB: 1931-03-22  Admit date: 02/20/2021 Date of Consult: 02/20/2021  PCP:  Merlene Laughter, MD   [facility] Providers Cardiologist:  Lesleigh Noe, MD        Patient Profile:   Anne [name] is a 87 y.o. female with a hx of recurrent atypical CP, HTN, anxiety, asthma, GERD, lung nodules, COPD, who is being seen 02/20/2021 for the evaluation of PVCs causing bradycardia, at the request of Dr Particia Nearing.  History of Present Illness:   Anne [name] has been having problems with palpitations and chest discomfort for a long time.  She thought it was anxiety causing a panic attack.  Recently, the symptoms have been worse and have been lasting longer.  She has started to get lightheaded and dizzy.  Dizziness is also a difficult symptom for her to define because she has problems with vertigo.  However, she recently had an appointment with her therapist who told her the vertigo was okay.  She will be sitting still, and will all of a sudden feel her heart skipping.  That will make her feel weak and dizzy.  After few minutes, the symptoms will resolve.  In the emergency room, she keeps going in and out of bigeminy.  If the bigeminy last long enough, she will get chest discomfort and feel lightheaded.  Her palpable pulse drops with the bigeminy.   Past Medical History:  Diagnosis Date   Allergic rhinitis    Arthritis    Asthma    Atypical chest pain    Collapse of right lung    Dyslipidemia    Gastroesophageal reflux disease    Glaucoma    Hematuria    Hyperlipidemia    Lung nodules     Past Surgical History:  Procedure Laterality Date   ANAL FISSURE REPAIR     BREAST BIOPSY Right    CATARACT EXTRACTION Bilateral    MASS EXCISION  04/11/2012   Procedure: EXCISION MASS;  Surgeon: Nicki Reaper, MD;  Location: [facility];  Service: Orthopedics;  Laterality: Right;  Excision of  mass right upper arm   MEDIASTINOSCOPY  4/11   NASAL SINUS SURGERY     TOTAL ABDOMINAL HYSTERECTOMY W/ BILATERAL SALPINGOOPHORECTOMY       Home Medications:  Prior to Admission medications   Medication Sig Start Date End Date Taking? Authorizing Provider  albuterol (PROAIR HFA) 108 (90 Base) MCG/ACT inhaler INHALE 1-2 PUFFS INTO THE LUNGS EVERY 6 (SIX) HOURS AS NEEDED FOR WHEEZING OR SHORTNESS OF BREATH. 05/06/20  Yes Kalman Shan, MD  amLODipine (NORVASC) 5 MG tablet Take 5 mg by mouth daily. 12/26/20  Yes [provider]  BREO ELLIPTA 100-25 MCG/INH AEPB INHALE 1 PUFF BY MOUTH EVERY DAY Patient taking differently: 1 puff daily. INHALE 1 PUFF BY MOUTH EVERY DAY 01/16/21  Yes Kalman Shan, MD  Calcium Carb-Cholecalciferol 600-200 MG-UNIT TABS Take 1 tablet by mouth daily.   Yes [provider]  cetirizine (ZYRTEC) 10 MG tablet Take 10 mg by mouth every morning.   Yes [provider]  ferrous sulfate 325 (65 FE) MG tablet Take 325 mg by mouth 3 (three) times a week. Take on MWF   Yes [provider]  fluticasone (FLONASE) 50 MCG/ACT nasal spray Place 1 spray into both nostrils 2 (two) times daily as needed for allergies.    Yes [provider]  gabapentin (NEURONTIN) 300 MG capsule  Take 1 capsule (300 mg total) by mouth daily. Patient taking differently: Take 300 mg by mouth at bedtime. 12/08/20  Yes Patel, Donika K, DO  latanoprost (XALATAN) 0.005 % ophthalmic solution Place 1 drop into both eyes at bedtime.   Yes [provider]  Multiple Vitamin (MULTIVITAMIN) tablet Take 1 tablet by mouth every morning.    Yes [provider]  TURMERIC PO Take 1 capsule by mouth daily.   Yes [provider]    Inpatient Medications: Scheduled Meds:  Continuous Infusions:  PRN Meds: nitroGLYCERIN  Allergies:    Allergies  Allergen Reactions   Other Shortness Of Breath    Pt states she has asthma and seafood causes  breathing problems.    Shellfish Allergy Shortness Of Breath   Iodine Other (See Comments)    REACTION: " UNSURE TOLD TO LIST AS ALLERGEN BY MD" REACTION: " UNSURE TOLD TO LIST AS ALLERGEN BY MD"   Latex Itching   Adhesive [Tape] Rash    Social History:   Social History   Socioeconomic History   Marital status: Married    Spouse name: Not on file   Number of children: Not on file   Years of education: Not on file   Highest education level: Not on file  Occupational History   Occupation: retired  Tobacco Use   Smoking status: Former    Years: 4.00    Types: Cigarettes    Start date: 03-03-52    Quit date: 1960    Years since quitting: 62.9   Smokeless tobacco: Never   Tobacco comments:    social smoker  Building services engineer Use: Never used  Substance and Sexual Activity   Alcohol use: No    Alcohol/week: 0.0 standard drinks   Drug use: No   Sexual activity: Never    Birth control/protection: Surgical  Other Topics Concern   Not on file  Social History Narrative   Retired Public relations account executive.  Lives alone, husband passed away in 03/03/17.  No children.   Right handed   One level home   Social Determinants of Health   Financial Resource Strain: Not on file  Food Insecurity: Not on file  Transportation Needs: Not on file  Physical Activity: Not on file  Stress: Not on file  Social Connections: Not on file  Intimate Partner Violence: Not on file    Family History:   Family History  Problem Relation Age of Onset   Cancer Mother    Heart disease Father    Breast cancer Cousin    Heart attack Neg Hx      ROS:  Please see the history of present illness.  All other ROS reviewed and negative.     Physical Exam/Data:   Vitals:   02/20/21 1200 02/20/21 1245 02/20/21 1315 02/20/21 1330  BP: (!) 145/64 (!) 142/39 (!) 154/66 (!) 154/70  Pulse: 68 (!) 37 69 (!) 103  Resp: 17 11 14 17   Temp:      TempSrc:      SpO2: 100% 100% 100% 91%  Weight:      Height:        No intake or output data in the 24 hours ending 02/20/21 1417 Last 3 Weights 02/20/2021 12/08/2020 07/18/2020  Weight (lbs) 158 lb 162 lb 163 lb  Weight (kg) 71.668 kg 73.483 kg 73.936 kg     Body mass index is 24.75 kg/m.  General:  Well nourished, well developed, in no acute distress  when in sinus rhythm HEENT: normal Neck: no JVD Vascular: No carotid bruits; Distal pulses 2+ bilaterally Cardiac:  normal S1, S2; RRR; no murmur  Lungs:  clear to auscultation bilaterally, no wheezing, rhonchi or rales  Abd: soft, nontender, no hepatomegaly  Ext: no edema Musculoskeletal:  No deformities, BUE and BLE strength normal and equal Skin: warm and dry  Neuro:  CNs 2-12 intact, no focal abnormalities noted Psych:  Normal affect   EKG:  The EKG was personally reviewed and demonstrates:   10:07 am ECG is SR, HR 73, normal intervals, no ischemic changes 11:26 am ECG is SR, HR 96, PVCs in bigeminy pattern Telemetry:  Telemetry was personally reviewed and demonstrates:  SR, w/ episodes of bigeminy.  The episodes are frequent and vary in length  Relevant CV Studies:  CPX: 10/25/2015 CPX:  Exercise Capacity- Exercise testing with gas exchange  demonstrates an excellent peak VO2 of 15.6 ml/kg/min (110% of  the age/gender/weight matched sedentary norms). The RER of 1.22  indicates a maximal effort. When adjusted to the patient's ideal  body weight of 149.5 lb (67.8 kg) the peak VO2 is 16.5 ml/kg  (ibw)/min (114% of the ibw-adjusted predicted).   Cardiovascular response- The O2pulse (a surrogate for stroke volume) increased with initial exercise and mildly continued to increase with exercise intensity reaching 8 ml/beat (114% predicted). DeltaVO2/Delta WR is 10 at peak. Indicating no evidence of cardiovascular impairment.   Ventilatory response- The VE/VCO2 slope mildly elevated and indicates  Increased dead space ventilation. The oxygen uptake efficiency slope (OUES) is mildly reduced. The VO2  at the ventilatory threshold was normal at 72% of the predicted peak VO2. At peak exercise, the ventilation reached 64% of the measured MVV and breathing  reserve was 26 indicating ventilatory reserve remained but ventilatory limits were beginning to apprioach.  PETCO2 was normal at32 mmHg during exercise.    Conclusion: Exercise testing with gas exchange demonstrates excellent functional capacity when compared to matched sedentary norms. Pre-exercise spirometry indicates obstructive lung patterns which is consistent with her previous existing asthma diagnosis. Although PVO2 healthy and above normal, patient had a significant hypertensive response to exercise. Patient is appears limited due to significant diastolic dysfunction. Please correlate clinically.   ECHO: 11/26/2014 Nuclear stress EF: 72%. The left ventricular ejection fraction is hyperdynamic (>65%). Blood pressure demonstrated a hypertensive response to exercise. Defect 1: There is a smallin size, mild severity, fixed defect present in the basal inferior and mid inferior location. This is most consistent with diaphragmatic attenuation. No ischemia noted. This is a low risk study. EKG uninterpretable during exercise due to severe baseline wander. In recovery there was 1mm of J point depression with horizontal to upsloping ST segment depression.  Laboratory Data:  High Sensitivity Troponin:   Recent Labs  Lab 02/20/21 1020 02/20/21 1150  TROPONINIHS 7 6     Chemistry Recent Labs  Lab 02/20/21 1020  NA 139  K 4.3  CL 107  CO2 25  GLUCOSE 112*  BUN 17  CREATININE 1.12*  CALCIUM 9.1  MG 2.0  GFRNONAA 47*  ANIONGAP 7    No results for input(s): PROT, ALBUMIN, AST, ALT, ALKPHOS, BILITOT in the last 168 hours. Lipids No results for input(s): CHOL, TRIG, HDL, LABVLDL, LDLCALC, CHOLHDL in the last 168 hours.  Hematology Recent Labs  Lab 02/20/21 1020  WBC 5.0  RBC 3.81*  HGB 11.7*  HCT 36.1  MCV 94.8  MCH 30.7   MCHC 32.4  RDW 13.6  PLT  297   Thyroid  Recent Labs  Lab 02/20/21 1006  TSH 2.370    BNPNo results for input(s): BNP, PROBNP in the last 168 hours.  DDimer No results for input(s): DDIMER in the last 168 hours. No results found for: CHOL, HDL, LDLCALC, LDLDIRECT, TRIG, CHOLHDL No results found for: UUVO5D   Radiology/Studies:  DG Chest Port 1 View  Result Date: 02/20/2021 CLINICAL DATA:  Chest pain EXAM: PORTABLE CHEST 1 VIEW COMPARISON:  08/17/2019 FINDINGS: The heart size and mediastinal contours are within normal limits. Aortic atherosclerosis. No focal airspace consolidation, pleural effusion, or pneumothorax. The visualized skeletal structures are unremarkable. IMPRESSION: No active disease. Electronically Signed   By: Duanne Guess D.O.   On: 02/20/2021 11:08     Assessment and Plan:   Palpitations: -She has frequent episodes of bigeminy - These are symptomatic with an effective heart rate in the 40s - She does not overdo caffeine, no acute illness, no alcohol or drug abuse - She is otherwise cardiac stable, with no history of chest pain with exertion. -We will check an echo - She is not on any rate lowering medications.  Her baseline heart rate is in the 60s and 70s. - Would not use a beta-blocker with her history of asthma. - Discuss with MD if we should try Cardizem - If medicines do not work, may need EP to see.  2.  Chest discomfort - This happens only in the setting of the palpitations - She has had some episodes since being in the ER and frequent episodes prior to admission - Cardiac enzymes are negative and baseline ECG is without acute ischemic changes - Discuss further eval with MD  3.  Hypertension - PTA she was on amlodipine 5 mg daily - Because she may need Cardizem, hold the amlodipine for now   Risk Assessment/Risk Scores:     HEAR Score (for undifferentiated chest pain):  HEAR Score: 4    For questions or updates, please contact [facility] Please consult [email or web address] for contact info under    Signed, Theodore Demark, PA-C  02/20/2021 2:17 PM  Patient seen and examined   I agree with findings as noted by R Barrett above   Pt is an 87 yo with hx of HTN, asthma, atypical CP    She says since last week she has not felt well   Feels week   NO dizziness or syncope  Has noted some chest prssure . ON arrival to ED found to have ventricular bigeminy    She denies dizziness   No syncope  ON exam, Neck:   JVP is normaol Lungs are CTA    Cardiac RRR   Occasional skip Abd is supple   No hepatomegaly Ext are without edema  2+ pulses  Telemetry with SR and then SR with bigeminy   Impression:   87 yo female who looks younger than stated age  Active   Over past week hasnt felt good   Vague   Pressure in chest   Weak  No dizzienss   Presents to ED   Here with bigeminy, PVCs    Pt has atherosclerosis noted in CTs in the past    REcomm   Admit for observation.    Echo to eval LV/RV function Check labs (trop, TSH, lpids) If LVEF normal plan CT coronary angiogram to eval coronary arteries.  Dietrich Pates MD

## 2021-02-20 NOTE — ED Notes (Signed)
Pt went back into bigeminy while RN at bedside, RN captured EKG to show Dr Gilford Raid. Pt did c/o dizziness/palpitations during bigeminy.

## 2021-02-21 ENCOUNTER — Encounter (HOSPITAL_COMMUNITY): Payer: Self-pay | Admitting: Internal Medicine

## 2021-02-21 ENCOUNTER — Other Ambulatory Visit (HOSPITAL_BASED_OUTPATIENT_CLINIC_OR_DEPARTMENT_OTHER): Payer: Self-pay | Admitting: Cardiology

## 2021-02-21 ENCOUNTER — Observation Stay (HOSPITAL_COMMUNITY): Payer: Medicare PPO

## 2021-02-21 ENCOUNTER — Observation Stay (HOSPITAL_COMMUNITY)
Admit: 2021-02-21 | Discharge: 2021-02-21 | Disposition: A | Discharge status: Home / Self Care | Payer: Medicare PPO | Attending: Cardiology | Admitting: Cardiology

## 2021-02-21 DIAGNOSIS — R931 Abnormal findings on diagnostic imaging of heart and coronary circulation: Secondary | ICD-10-CM | POA: Diagnosis not present

## 2021-02-21 DIAGNOSIS — R0789 Other chest pain: Secondary | ICD-10-CM | POA: Diagnosis not present

## 2021-02-21 DIAGNOSIS — R072 Precordial pain: Secondary | ICD-10-CM

## 2021-02-21 DIAGNOSIS — R079 Chest pain, unspecified: Secondary | ICD-10-CM | POA: Diagnosis not present

## 2021-02-21 DIAGNOSIS — R55 Syncope and collapse: Secondary | ICD-10-CM | POA: Diagnosis not present

## 2021-02-21 DIAGNOSIS — R001 Bradycardia, unspecified: Secondary | ICD-10-CM | POA: Diagnosis present

## 2021-02-21 DIAGNOSIS — Z91040 Latex allergy status: Secondary | ICD-10-CM | POA: Diagnosis not present

## 2021-02-21 DIAGNOSIS — Z79899 Other long term (current) drug therapy: Secondary | ICD-10-CM | POA: Diagnosis not present

## 2021-02-21 DIAGNOSIS — Z87891 Personal history of nicotine dependence: Secondary | ICD-10-CM | POA: Diagnosis not present

## 2021-02-21 DIAGNOSIS — J449 Chronic obstructive pulmonary disease, unspecified: Secondary | ICD-10-CM | POA: Diagnosis not present

## 2021-02-21 DIAGNOSIS — I493 Ventricular premature depolarization: Secondary | ICD-10-CM

## 2021-02-21 DIAGNOSIS — R002 Palpitations: Secondary | ICD-10-CM | POA: Diagnosis not present

## 2021-02-21 DIAGNOSIS — I10 Essential (primary) hypertension: Secondary | ICD-10-CM | POA: Diagnosis not present

## 2021-02-21 DIAGNOSIS — Z20822 Contact with and (suspected) exposure to covid-19: Secondary | ICD-10-CM | POA: Diagnosis not present

## 2021-02-21 DIAGNOSIS — I2510 Atherosclerotic heart disease of native coronary artery without angina pectoris: Secondary | ICD-10-CM | POA: Diagnosis not present

## 2021-02-21 HISTORY — DX: Bradycardia, unspecified: R00.1

## 2021-02-21 HISTORY — DX: Ventricular premature depolarization: I49.3

## 2021-02-21 LAB — COMPREHENSIVE METABOLIC PANEL
ALT: 19 U/L (ref 0–44)
AST: 26 U/L (ref 15–41)
Albumin: 3.7 g/dL (ref 3.5–5.0)
Alkaline Phosphatase: 60 U/L (ref 38–126)
Anion gap: 9 (ref 5–15)
BUN: 15 mg/dL (ref 8–23)
CO2: 25 mmol/L (ref 22–32)
Calcium: 9.5 mg/dL (ref 8.9–10.3)
Chloride: 104 mmol/L (ref 98–111)
Creatinine, Ser: 0.99 mg/dL (ref 0.44–1.00)
GFR, Estimated: 55 mL/min — ABNORMAL LOW (ref >60)
Glucose, Bld: 104 mg/dL — ABNORMAL HIGH (ref 70–99)
Potassium: 4.1 mmol/L (ref 3.5–5.1)
Sodium: 138 mmol/L (ref 135–145)
Total Bilirubin: 0.7 mg/dL (ref 0.3–1.2)
Total Protein: 7.1 g/dL (ref 6.5–8.1)

## 2021-02-21 LAB — CBC
HCT: 39.4 % (ref 36.0–46.0)
Hemoglobin: 13.1 g/dL (ref 12.0–15.0)
MCH: 31 pg (ref 26.0–34.0)
MCHC: 33.2 g/dL (ref 30.0–36.0)
MCV: 93.1 fL (ref 80.0–100.0)
Platelets: 318 10*3/uL (ref 150–400)
RBC: 4.23 MIL/uL (ref 3.87–5.11)
RDW: 13.2 % (ref 11.5–15.5)
WBC: 5.5 10*3/uL (ref 4.0–10.5)
nRBC: 0 % (ref 0.0–0.2)

## 2021-02-21 LAB — RESP PANEL BY RT-PCR (FLU A&B, COVID) ARPGX2
Influenza A by PCR: NEGATIVE
Influenza B by PCR: NEGATIVE
SARS Coronavirus 2 by RT PCR: NEGATIVE

## 2021-02-21 MED ORDER — SODIUM CHLORIDE 0.9 % IV SOLN: 250.0000 mL | INTRAVENOUS | Status: DC | PRN

## 2021-02-21 MED ORDER — ROSUVASTATIN CALCIUM 5 MG PO TABS: 10.0000 mg | ORAL_TABLET | Freq: Every day | ORAL | Status: DC

## 2021-02-21 MED ORDER — ROSUVASTATIN CALCIUM 20 MG PO TABS: 20.0000 mg | ORAL_TABLET | Freq: Every day | ORAL | 6 refills | Status: DC

## 2021-02-21 MED ORDER — NITROGLYCERIN 0.4 MG SL SUBL: 0.4000 mg | SUBLINGUAL_TABLET | SUBLINGUAL | Status: DC | PRN

## 2021-02-21 MED ORDER — ROSUVASTATIN CALCIUM 20 MG PO TABS: 20.0000 mg | ORAL_TABLET | Freq: Every day | ORAL | Status: DC

## 2021-02-21 MED ORDER — ASPIRIN EC 81 MG PO TBEC
81.0000 mg | DELAYED_RELEASE_TABLET | Freq: Every day | ORAL | Status: DC
  Administered 2021-02-21: 81 mg via ORAL
  Filled 2021-02-21: qty 1

## 2021-02-21 MED ORDER — ASPIRIN 81 MG PO TBEC: 81.0000 mg | DELAYED_RELEASE_TABLET | Freq: Every day | ORAL | 11 refills | Status: DC

## 2021-02-21 MED ORDER — METOPROLOL TARTRATE 25 MG PO TABS: 12.5000 mg | ORAL_TABLET | Freq: Two times a day (BID) | ORAL | 6 refills | Status: DC

## 2021-02-21 MED ORDER — ACETAMINOPHEN 325 MG PO TABS
650.0000 mg | ORAL_TABLET | ORAL | Status: DC | PRN
  Administered 2021-02-21: 650 mg via ORAL
  Filled 2021-02-21: qty 2

## 2021-02-21 MED ORDER — SODIUM CHLORIDE 0.9% FLUSH
3.0000 mL | Freq: Two times a day (BID) | INTRAVENOUS | Status: DC
  Administered 2021-02-21: 3 mL via INTRAVENOUS

## 2021-02-21 MED ORDER — NITROGLYCERIN 0.4 MG SL SUBL: 0.4000 mg | SUBLINGUAL_TABLET | SUBLINGUAL | 4 refills | Status: DC | PRN

## 2021-02-21 MED ORDER — ENOXAPARIN SODIUM 40 MG/0.4ML IJ SOSY
40.0000 mg | PREFILLED_SYRINGE | Freq: Every day | INTRAMUSCULAR | Status: DC
  Administered 2021-02-21: 40 mg via SUBCUTANEOUS
  Filled 2021-02-21: qty 0.4

## 2021-02-21 MED ORDER — ONDANSETRON HCL 4 MG/2ML IJ SOLN: 4.0000 mg | Freq: Four times a day (QID) | INTRAMUSCULAR | Status: DC | PRN

## 2021-02-21 MED ORDER — METOPROLOL TARTRATE 25 MG PO TABS: 12.5000 mg | ORAL_TABLET | Freq: Two times a day (BID) | ORAL | Status: DC

## 2021-02-21 MED ORDER — SODIUM CHLORIDE 0.9% FLUSH: 3.0000 mL | INTRAVENOUS | Status: DC | PRN

## 2021-02-21 MED ORDER — ZOLPIDEM TARTRATE 5 MG PO TABS: 5.0000 mg | ORAL_TABLET | Freq: Every evening | ORAL | Status: DC | PRN

## 2021-02-21 MED ORDER — NITROGLYCERIN 0.4 MG SL SUBL
SUBLINGUAL_TABLET | SUBLINGUAL | Status: AC
  Filled 2021-02-21: qty 2

## 2021-02-21 MED ORDER — ALPRAZOLAM 0.25 MG PO TABS: 0.2500 mg | ORAL_TABLET | Freq: Two times a day (BID) | ORAL | Status: DC | PRN

## 2021-02-21 MED ORDER — IOHEXOL 350 MG/ML SOLN
95.0000 mL | Freq: Once | INTRAVENOUS | Status: AC | PRN
  Administered 2021-02-21: 95 mL via INTRAVENOUS

## 2021-02-21 NOTE — ED Notes (Signed)
Called pt placement. Per pt placement they will take bed away, so pt can be discharged.

## 2021-02-21 NOTE — Progress Notes (Addendum)
Progress Note  Patient Name: Anne [name] Date of Encounter: 02/21/2021  [facility] Cardiologist: Lesleigh Noe, MD   Subjective   Pt had remote shellfish allergy but has not had reaction to contrast in past.   No complaints this AM,  gave results of echo.  Less PVCs overnight Inpatient Medications    Scheduled Meds:  calcium-vitamin D  1 tablet Oral Daily   enoxaparin (LOVENOX) injection  40 mg Subcutaneous Daily   fluticasone furoate-vilanterol  1 puff Inhalation Daily   gabapentin  300 mg Oral QHS   latanoprost  1 drop Both Eyes QHS   loratadine  10 mg Oral Daily   metoprolol tartrate  25 mg Oral BID   multivitamin with minerals  1 tablet Oral q morning   sodium chloride flush  3 mL Intravenous Q12H   Continuous Infusions:  sodium chloride     PRN Meds: sodium chloride, acetaminophen, albuterol, ALPRAZolam, fluticasone, nitroGLYCERIN, nitroGLYCERIN, ondansetron (ZOFRAN) IV, sodium chloride flush, zolpidem   Vital Signs    Vitals:   02/21/21 0255 02/21/21 0500 02/21/21 0530 02/21/21 0630  BP: (!) 139/107 (!) 133/48 (!) 131/51 (!) 132/59  Pulse: 64 (!) 54 (!) 50 (!) 55  Resp: 14 15 13 10   Temp:      TempSrc:      SpO2: 99% 98% 97% 97%  Weight:      Height:       No intake or output data in the 24 hours ending 02/21/21 0725 Last 3 Weights 02/20/2021 12/08/2020 07/18/2020  Weight (lbs) 158 lb 162 lb 163 lb  Weight (kg) 71.668 kg 73.483 kg 73.936 kg      Telemetry    SB to SR with occ to rare PVCs - Personally Reviewed  ECG    Today's SB at 56 1st degree AV block PR 255 ms up from 197 ms yesterday   - Personally Reviewed  Physical Exam   GEN: No acute distress.   Neck: No JVD Cardiac: RRR, no murmurs, rubs, or gallops.  Respiratory: Clear to auscultation bilaterally. GI: Soft, nontender, non-distended  MS: No edema; No deformity. Neuro:  Nonfocal  Psych: Normal affect   Labs    High Sensitivity Troponin:   Recent Labs  Lab  02/20/21 1020 02/20/21 1150  TROPONINIHS 7 6     Chemistry Recent Labs  Lab 02/20/21 1020  NA 139  K 4.3  CL 107  CO2 25  GLUCOSE 112*  BUN 17  CREATININE 1.12*  CALCIUM 9.1  MG 2.0  GFRNONAA 47*  ANIONGAP 7    Lipids No results for input(s): CHOL, TRIG, HDL, LABVLDL, LDLCALC, CHOLHDL in the last 168 hours.  Hematology Recent Labs  Lab 02/20/21 1020  WBC 5.0  RBC 3.81*  HGB 11.7*  HCT 36.1  MCV 94.8  MCH 30.7  MCHC 32.4  RDW 13.6  PLT 297   Thyroid  Recent Labs  Lab 02/20/21 1006  TSH 2.370    BNPNo results for input(s): BNP, PROBNP in the last 168 hours.  DDimer No results for input(s): DDIMER in the last 168 hours.   Radiology    DG Chest Port 1 View  Result Date: 02/20/2021 CLINICAL DATA:  Chest pain EXAM: PORTABLE CHEST 1 VIEW COMPARISON:  08/17/2019 FINDINGS: The heart size and mediastinal contours are within normal limits. Aortic atherosclerosis. No focal airspace consolidation, pleural effusion, or pneumothorax. The visualized skeletal structures are unremarkable. IMPRESSION: No active disease. Electronically Signed   By: Janyth Pupa  Plundo D.O.   On: 02/20/2021 11:08   ECHOCARDIOGRAM COMPLETE  Result Date: 02/20/2021    ECHOCARDIOGRAM REPORT   Patient Name:   [facility] Date of Exam: 02/20/2021 Medical Rec #:  [id number]           Height:       67.0 in Accession #:    [id number]          Weight:       158.0 lb Date of Birth:  Jul 27, 1930           BSA:          1.829 m Patient Age:    87 years            BP:           154/79 mmHg Patient Gender: F                   HR:           68 bpm. Exam Location:  Inpatient Procedure: 2D Echo, Cardiac Doppler and Color Doppler Indications:    Chest pain  History:        Patient has no prior history of Echocardiogram examinations.                 Risk Factors:Dyslipidemia.  Sonographer:    Gertie Fey MHA, RDMS, RVT, RDCS Referring Phys: [id number] Jahmia Berrett V Legacy Carrender  Sonographer Comments: Technically difficult study  due to poor echo windows. IMPRESSIONS  1. Left ventricular ejection fraction, by estimation, is 60 to 65%. The left ventricle has normal function. The left ventricle has no regional wall motion abnormalities. Left ventricular diastolic parameters are consistent with Grade I diastolic dysfunction (impaired relaxation).  2. Right ventricular systolic function is normal. The right ventricular size is normal. There is normal pulmonary artery systolic pressure.  3. The mitral valve is grossly normal. Trivial mitral valve regurgitation.  4. The aortic valve is tricuspid. Aortic valve regurgitation is not visualized. Aortic valve sclerosis is present, with no evidence of aortic valve stenosis.  5. The inferior vena cava is normal in size with greater than 50% respiratory variability, suggesting right atrial pressure of 3 mmHg. Comparison(s): No prior Echocardiogram. FINDINGS  Left Ventricle: Left ventricular ejection fraction, by estimation, is 60 to 65%. The left ventricle has normal function. The left ventricle has no regional wall motion abnormalities. The left ventricular internal cavity size was normal in size. There is  no left ventricular hypertrophy. Left ventricular diastolic parameters are consistent with Grade I diastolic dysfunction (impaired relaxation). Indeterminate filling pressures. Right Ventricle: The right ventricular size is normal. No increase in right ventricular wall thickness. Right ventricular systolic function is normal. There is normal pulmonary artery systolic pressure. The tricuspid regurgitant velocity is 2.71 m/s, and  with an assumed right atrial pressure of 3 mmHg, the estimated right ventricular systolic pressure is 32.4 mmHg. Left Atrium: Left atrial size was normal in size. Right Atrium: Right atrial size was normal in size. Pericardium: There is no evidence of pericardial effusion. Mitral Valve: The mitral valve is grossly normal. Trivial mitral valve regurgitation. Tricuspid Valve:  The tricuspid valve is grossly normal. Tricuspid valve regurgitation is mild. Aortic Valve: The aortic valve is tricuspid. Aortic valve regurgitation is not visualized. Aortic valve sclerosis is present, with no evidence of aortic valve stenosis. Aortic valve mean gradient measures 4.0 mmHg. Aortic valve peak gradient measures 5.9  mmHg. Aortic valve area, by VTI measures 2.26 cm. Pulmonic Valve:  The pulmonic valve was not well visualized. Pulmonic valve regurgitation is not visualized. Aorta: The aortic root and ascending aorta are structurally normal, with no evidence of dilitation. Venous: The inferior vena cava is normal in size with greater than 50% respiratory variability, suggesting right atrial pressure of 3 mmHg. IAS/Shunts: No atrial level shunt detected by color flow Doppler.  LEFT VENTRICLE PLAX 2D LVIDd:         3.60 cm   Diastology LVIDs:         2.80 cm   LV e' medial:    6.31 cm/s LV PW:         0.65 cm   LV E/e' medial:  15.8 LV IVS:        0.60 cm   LV e' lateral:   9.25 cm/s LVOT diam:     2.00 cm   LV E/e' lateral: 10.8 LV SV:         60 LV SV Index:   33 LVOT Area:     3.14 cm  RIGHT VENTRICLE RV S prime:     8.08 cm/s TAPSE (M-mode): 1.9 cm LEFT ATRIUM             Index        RIGHT ATRIUM           Index LA diam:        3.00 cm 1.64 cm/m   RA Area:     11.80 cm LA Vol (A2C):   35.5 ml 19.40 ml/m  RA Volume:   29.60 ml  16.18 ml/m LA Vol (A4C):   76.8 ml 41.98 ml/m LA Biplane Vol: 54.2 ml 29.63 ml/m  AORTIC VALVE AV Area (Vmax):    2.47 cm AV Area (Vmean):   2.16 cm AV Area (VTI):     2.26 cm AV Vmax:           121.00 cm/s AV Vmean:          91.800 cm/s AV VTI:            0.264 m AV Peak Grad:      5.9 mmHg AV Mean Grad:      4.0 mmHg LVOT Vmax:         95.30 cm/s LVOT Vmean:        63.200 cm/s LVOT VTI:          0.190 m LVOT/AV VTI ratio: 0.72  AORTA Ao Root diam: 2.67 cm MITRAL VALVE                TRICUSPID VALVE MV Area (PHT): 2.75 cm     TR Peak grad:   29.4 mmHg MV Decel Time:  276 msec     TR Vmax:        271.00 cm/s MV E velocity: 99.90 cm/s MV A velocity: 125.00 cm/s  SHUNTS MV E/A ratio:  0.80         Systemic VTI:  0.19 m                             Systemic Diam: 2.00 cm Zoila Shutter MD Electronically signed by Zoila Shutter MD Signature Date/Time: 02/20/2021/4:57:08 PM    Final     Cardiac Studies   Echo 02/20/21  IMPRESSIONS     1. Left ventricular ejection fraction, by estimation, is 60 to 65%. The  left ventricle has normal function. The left ventricle has no regional  wall  motion abnormalities. Left ventricular diastolic parameters are  consistent with Grade I diastolic  dysfunction (impaired relaxation).   2. Right ventricular systolic function is normal. The right ventricular  size is normal. There is normal pulmonary artery systolic pressure.   3. The mitral valve is grossly normal. Trivial mitral valve  regurgitation.   4. The aortic valve is tricuspid. Aortic valve regurgitation is not  visualized. Aortic valve sclerosis is present, with no evidence of aortic  valve stenosis.   5. The inferior vena cava is normal in size with greater than 50%  respiratory variability, suggesting right atrial pressure of 3 mmHg.   Comparison(s): No prior Echocardiogram.   FINDINGS   Left Ventricle: Left ventricular ejection fraction, by estimation, is 60  to 65%. The left ventricle has normal function. The left ventricle has no  regional wall motion abnormalities. The left ventricular internal cavity  size was normal in size. There is   no left ventricular hypertrophy. Left ventricular diastolic parameters  are consistent with Grade I diastolic dysfunction (impaired relaxation).  Indeterminate filling pressures.   Right Ventricle: The right ventricular size is normal. No increase in  right ventricular wall thickness. Right ventricular systolic function is  normal. There is normal pulmonary artery systolic pressure. The tricuspid  regurgitant velocity is  2.71 m/s, and   with an assumed right atrial pressure of 3 mmHg, the estimated right  ventricular systolic pressure is 32.4 mmHg.   Left Atrium: Left atrial size was normal in size.   Right Atrium: Right atrial size was normal in size.   Pericardium: There is no evidence of pericardial effusion.   Mitral Valve: The mitral valve is grossly normal. Trivial mitral valve  regurgitation.   Tricuspid Valve: The tricuspid valve is grossly normal. Tricuspid valve  regurgitation is mild.   Aortic Valve: The aortic valve is tricuspid. Aortic valve regurgitation is  not visualized. Aortic valve sclerosis is present, with no evidence of  aortic valve stenosis. Aortic valve mean gradient measures 4.0 mmHg.  Aortic valve peak gradient measures 5.9   mmHg. Aortic valve area, by VTI measures 2.26 cm.   Pulmonic Valve: The pulmonic valve was not well visualized. Pulmonic valve  regurgitation is not visualized.   Aorta: The aortic root and ascending aorta are structurally normal, with  no evidence of dilitation.   Venous: The inferior vena cava is normal in size with greater than 50%  respiratory variability, suggesting right atrial pressure of 3 mmHg.   IAS/Shunts: No atrial level shunt detected by color flow Doppler.      LEFT VENTRICLE  PLAX 2D  LVIDd:         3.60 cm   Diastology  LVIDs:         2.80 cm   LV e' medial:    6.31 cm/s  LV PW:         0.65 cm   LV E/e' medial:  15.8  LV IVS:        0.60 cm   LV e' lateral:   9.25 cm/s  LVOT diam:     2.00 cm   LV E/e' lateral: 10.8  LV SV:         60  LV SV Index:   33  LVOT Area:     3.14 cm      RIGHT VENTRICLE  RV S prime:     8.08 cm/s  TAPSE (M-mode): 1.9 cm  Patient Profile     87 y.o. female  with a hx of recurrent atypical CP, HTN, anxiety, asthma, GERD, lung nodules, COPD now admitted with PVCs and dizziness.   Assessment & Plan    Palpitations/symptomatic PVCs  --normal echo  --hs troponin 7 & 6 --started BB  lopressor 25 BID --Cardiac CTA today- had remote allergy to shellfish but no reaction to contrast.    Chest discomfort with palpitations neg MI --neg troponin see above  HTN now off amlodipine and on lopressor monitor BP       For questions or updates, please contact [facility] Please consult [email or web address] for contact info under        Signed, Nada Boozer, NP  02/21/2021, 7:25 AM     Patient seen and examined   She is feeling better than yestreday    ON b blocker PVC frequ has decreased HR down a litte wit hfirst degree AV block   FOllow  Echo showed normal LVE and RVEF    With chest pressure and new ectopy CT angio oredered  Just done  ON exam   Lungs are CTA  Cardiac RRR  NO S3    ABd benign Ext are without edema  CT results pending  Keep on low dose b blocker for now  Dietrich Pates MD

## 2021-02-21 NOTE — ED Notes (Signed)
Inglod PA at bedside

## 2021-02-21 NOTE — ED Notes (Signed)
Discharge instructions reviewed with patient and support person. Patient and support person verbalized understanding of instructions. Follow-up care and medications were reviewed. Patient wheeled out of ED with steady gait. VSS upon discharge.

## 2021-02-21 NOTE — Discharge Instructions (Signed)
Take 1 NTG, under your tongue, while sitting.  If no relief of pain may repeat NTG, one tab every 5 minutes up to 3 tablets total over 15 minutes.  If no relief CALL 911.  If you have dizziness/lightheadness  while taking NTG, stop taking and call 911.         Call if you have any questions  If your blood pressure is staying >[phone]  systolic call the office and we will adjust your meds  Heart healthy diet

## 2021-02-21 NOTE — ED Notes (Signed)
Breakfast order placed ?

## 2021-02-21 NOTE — Discharge Summary (Addendum)
Discharge Summary    Patient ID: Anne [name] MRN: [id number]; DOB: [birth year]  Admit date: 02/20/2021 Discharge date: 02/21/2021  PCP:  [name], MD   [facility] Providers Cardiologist:  Sinclair Grooms, MD        Discharge Diagnoses    Principal Problem:   Near syncope Active Problems:   Chest pressure   Essential hypertension   PVC's (premature ventricular contractions) symptomatic   Sinus bradycardia    Diagnostic Studies/Procedures    Echo 02/20/21 IMPRESSIONS     1. Left ventricular ejection fraction, by estimation, is 60 to 65%. The  left ventricle has normal function. The left ventricle has no regional  wall motion abnormalities. Left ventricular diastolic parameters are  consistent with Grade I diastolic  dysfunction (impaired relaxation).   2. Right ventricular systolic function is normal. The right ventricular  size is normal. There is normal pulmonary artery systolic pressure.   3. The mitral valve is grossly normal. Trivial mitral valve  regurgitation.   4. The aortic valve is tricuspid. Aortic valve regurgitation is not  visualized. Aortic valve sclerosis is present, with no evidence of aortic  valve stenosis.   5. The inferior vena cava is normal in size with greater than 50%  respiratory variability, suggesting right atrial pressure of 3 mmHg.   Comparison(s): No prior Echocardiogram.   FINDINGS   Left Ventricle: Left ventricular ejection fraction, by estimation, is 60  to 65%. The left ventricle has normal function. The left ventricle has no  regional wall motion abnormalities. The left ventricular internal cavity  size was normal in size. There is   no left ventricular hypertrophy. Left ventricular diastolic parameters  are consistent with Grade I diastolic dysfunction (impaired relaxation).  Indeterminate filling pressures.   Right Ventricle: The right ventricular size is normal. No increase in  right ventricular wall  thickness. Right ventricular systolic function is  normal. There is normal pulmonary artery systolic pressure. The tricuspid  regurgitant velocity is 2.71 m/s, and   with an assumed right atrial pressure of 3 mmHg, the estimated right  ventricular systolic pressure is 95.1 mmHg.   Left Atrium: Left atrial size was normal in size.   Right Atrium: Right atrial size was normal in size.   Pericardium: There is no evidence of pericardial effusion.   Mitral Valve: The mitral valve is grossly normal. Trivial mitral valve  regurgitation.   Tricuspid Valve: The tricuspid valve is grossly normal. Tricuspid valve  regurgitation is mild.   Aortic Valve: The aortic valve is tricuspid. Aortic valve regurgitation is  not visualized. Aortic valve sclerosis is present, with no evidence of  aortic valve stenosis. Aortic valve mean gradient measures 4.0 mmHg.  Aortic valve peak gradient measures 5.9   mmHg. Aortic valve area, by VTI measures 2.26 cm.   Pulmonic Valve: The pulmonic valve was not well visualized. Pulmonic valve  regurgitation is not visualized.   Aorta: The aortic root and ascending aorta are structurally normal, with  no evidence of dilitation.   Venous: The inferior vena cava is normal in size with greater than 50%  respiratory variability, suggesting right atrial pressure of 3 mmHg.   IAS/Shunts: No atrial level shunt detected by color flow Doppler.      LEFT VENTRICLE  PLAX 2D  LVIDd:         3.60 cm   Diastology  LVIDs:         2.80 cm   LV e' medial:  Discharge Summary    Patient ID: Anne [name] MRN: [id number]; DOB: [birth year]  Admit date: 02/20/2021 Discharge date: 02/21/2021  PCP:  [name], MD   [facility] Providers Cardiologist:  Sinclair Grooms, MD        Discharge Diagnoses    Principal Problem:   Near syncope Active Problems:   Chest pressure   Essential hypertension   PVC's (premature ventricular contractions) symptomatic   Sinus bradycardia    Diagnostic Studies/Procedures    Echo 02/20/21 IMPRESSIONS     1. Left ventricular ejection fraction, by estimation, is 60 to 65%. The  left ventricle has normal function. The left ventricle has no regional  wall motion abnormalities. Left ventricular diastolic parameters are  consistent with Grade I diastolic  dysfunction (impaired relaxation).   2. Right ventricular systolic function is normal. The right ventricular  size is normal. There is normal pulmonary artery systolic pressure.   3. The mitral valve is grossly normal. Trivial mitral valve  regurgitation.   4. The aortic valve is tricuspid. Aortic valve regurgitation is not  visualized. Aortic valve sclerosis is present, with no evidence of aortic  valve stenosis.   5. The inferior vena cava is normal in size with greater than 50%  respiratory variability, suggesting right atrial pressure of 3 mmHg.   Comparison(s): No prior Echocardiogram.   FINDINGS   Left Ventricle: Left ventricular ejection fraction, by estimation, is 60  to 65%. The left ventricle has normal function. The left ventricle has no  regional wall motion abnormalities. The left ventricular internal cavity  size was normal in size. There is   no left ventricular hypertrophy. Left ventricular diastolic parameters  are consistent with Grade I diastolic dysfunction (impaired relaxation).  Indeterminate filling pressures.   Right Ventricle: The right ventricular size is normal. No increase in  right ventricular wall  thickness. Right ventricular systolic function is  normal. There is normal pulmonary artery systolic pressure. The tricuspid  regurgitant velocity is 2.71 m/s, and   with an assumed right atrial pressure of 3 mmHg, the estimated right  ventricular systolic pressure is 95.1 mmHg.   Left Atrium: Left atrial size was normal in size.   Right Atrium: Right atrial size was normal in size.   Pericardium: There is no evidence of pericardial effusion.   Mitral Valve: The mitral valve is grossly normal. Trivial mitral valve  regurgitation.   Tricuspid Valve: The tricuspid valve is grossly normal. Tricuspid valve  regurgitation is mild.   Aortic Valve: The aortic valve is tricuspid. Aortic valve regurgitation is  not visualized. Aortic valve sclerosis is present, with no evidence of  aortic valve stenosis. Aortic valve mean gradient measures 4.0 mmHg.  Aortic valve peak gradient measures 5.9   mmHg. Aortic valve area, by VTI measures 2.26 cm.   Pulmonic Valve: The pulmonic valve was not well visualized. Pulmonic valve  regurgitation is not visualized.   Aorta: The aortic root and ascending aorta are structurally normal, with  no evidence of dilitation.   Venous: The inferior vena cava is normal in size with greater than 50%  respiratory variability, suggesting right atrial pressure of 3 mmHg.   IAS/Shunts: No atrial level shunt detected by color flow Doppler.      LEFT VENTRICLE  PLAX 2D  LVIDd:         3.60 cm   Diastology  LVIDs:         2.80 cm   LV e' medial:  Discharge Summary    Patient ID: Anne [name] MRN: [id number]; DOB: [birth year]  Admit date: 02/20/2021 Discharge date: 02/21/2021  PCP:  [name], MD   [facility] Providers Cardiologist:  Sinclair Grooms, MD        Discharge Diagnoses    Principal Problem:   Near syncope Active Problems:   Chest pressure   Essential hypertension   PVC's (premature ventricular contractions) symptomatic   Sinus bradycardia    Diagnostic Studies/Procedures    Echo 02/20/21 IMPRESSIONS     1. Left ventricular ejection fraction, by estimation, is 60 to 65%. The  left ventricle has normal function. The left ventricle has no regional  wall motion abnormalities. Left ventricular diastolic parameters are  consistent with Grade I diastolic  dysfunction (impaired relaxation).   2. Right ventricular systolic function is normal. The right ventricular  size is normal. There is normal pulmonary artery systolic pressure.   3. The mitral valve is grossly normal. Trivial mitral valve  regurgitation.   4. The aortic valve is tricuspid. Aortic valve regurgitation is not  visualized. Aortic valve sclerosis is present, with no evidence of aortic  valve stenosis.   5. The inferior vena cava is normal in size with greater than 50%  respiratory variability, suggesting right atrial pressure of 3 mmHg.   Comparison(s): No prior Echocardiogram.   FINDINGS   Left Ventricle: Left ventricular ejection fraction, by estimation, is 60  to 65%. The left ventricle has normal function. The left ventricle has no  regional wall motion abnormalities. The left ventricular internal cavity  size was normal in size. There is   no left ventricular hypertrophy. Left ventricular diastolic parameters  are consistent with Grade I diastolic dysfunction (impaired relaxation).  Indeterminate filling pressures.   Right Ventricle: The right ventricular size is normal. No increase in  right ventricular wall  thickness. Right ventricular systolic function is  normal. There is normal pulmonary artery systolic pressure. The tricuspid  regurgitant velocity is 2.71 m/s, and   with an assumed right atrial pressure of 3 mmHg, the estimated right  ventricular systolic pressure is 95.1 mmHg.   Left Atrium: Left atrial size was normal in size.   Right Atrium: Right atrial size was normal in size.   Pericardium: There is no evidence of pericardial effusion.   Mitral Valve: The mitral valve is grossly normal. Trivial mitral valve  regurgitation.   Tricuspid Valve: The tricuspid valve is grossly normal. Tricuspid valve  regurgitation is mild.   Aortic Valve: The aortic valve is tricuspid. Aortic valve regurgitation is  not visualized. Aortic valve sclerosis is present, with no evidence of  aortic valve stenosis. Aortic valve mean gradient measures 4.0 mmHg.  Aortic valve peak gradient measures 5.9   mmHg. Aortic valve area, by VTI measures 2.26 cm.   Pulmonic Valve: The pulmonic valve was not well visualized. Pulmonic valve  regurgitation is not visualized.   Aorta: The aortic root and ascending aorta are structurally normal, with  no evidence of dilitation.   Venous: The inferior vena cava is normal in size with greater than 50%  respiratory variability, suggesting right atrial pressure of 3 mmHg.   IAS/Shunts: No atrial level shunt detected by color flow Doppler.      LEFT VENTRICLE  PLAX 2D  LVIDd:         3.60 cm   Diastology  LVIDs:         2.80 cm   LV e' medial:  Discharge Summary    Patient ID: Anne [name] MRN: [id number]; DOB: [birth year]  Admit date: 02/20/2021 Discharge date: 02/21/2021  PCP:  [name], MD   [facility] Providers Cardiologist:  Sinclair Grooms, MD        Discharge Diagnoses    Principal Problem:   Near syncope Active Problems:   Chest pressure   Essential hypertension   PVC's (premature ventricular contractions) symptomatic   Sinus bradycardia    Diagnostic Studies/Procedures    Echo 02/20/21 IMPRESSIONS     1. Left ventricular ejection fraction, by estimation, is 60 to 65%. The  left ventricle has normal function. The left ventricle has no regional  wall motion abnormalities. Left ventricular diastolic parameters are  consistent with Grade I diastolic  dysfunction (impaired relaxation).   2. Right ventricular systolic function is normal. The right ventricular  size is normal. There is normal pulmonary artery systolic pressure.   3. The mitral valve is grossly normal. Trivial mitral valve  regurgitation.   4. The aortic valve is tricuspid. Aortic valve regurgitation is not  visualized. Aortic valve sclerosis is present, with no evidence of aortic  valve stenosis.   5. The inferior vena cava is normal in size with greater than 50%  respiratory variability, suggesting right atrial pressure of 3 mmHg.   Comparison(s): No prior Echocardiogram.   FINDINGS   Left Ventricle: Left ventricular ejection fraction, by estimation, is 60  to 65%. The left ventricle has normal function. The left ventricle has no  regional wall motion abnormalities. The left ventricular internal cavity  size was normal in size. There is   no left ventricular hypertrophy. Left ventricular diastolic parameters  are consistent with Grade I diastolic dysfunction (impaired relaxation).  Indeterminate filling pressures.   Right Ventricle: The right ventricular size is normal. No increase in  right ventricular wall  thickness. Right ventricular systolic function is  normal. There is normal pulmonary artery systolic pressure. The tricuspid  regurgitant velocity is 2.71 m/s, and   with an assumed right atrial pressure of 3 mmHg, the estimated right  ventricular systolic pressure is 95.1 mmHg.   Left Atrium: Left atrial size was normal in size.   Right Atrium: Right atrial size was normal in size.   Pericardium: There is no evidence of pericardial effusion.   Mitral Valve: The mitral valve is grossly normal. Trivial mitral valve  regurgitation.   Tricuspid Valve: The tricuspid valve is grossly normal. Tricuspid valve  regurgitation is mild.   Aortic Valve: The aortic valve is tricuspid. Aortic valve regurgitation is  not visualized. Aortic valve sclerosis is present, with no evidence of  aortic valve stenosis. Aortic valve mean gradient measures 4.0 mmHg.  Aortic valve peak gradient measures 5.9   mmHg. Aortic valve area, by VTI measures 2.26 cm.   Pulmonic Valve: The pulmonic valve was not well visualized. Pulmonic valve  regurgitation is not visualized.   Aorta: The aortic root and ascending aorta are structurally normal, with  no evidence of dilitation.   Venous: The inferior vena cava is normal in size with greater than 50%  respiratory variability, suggesting right atrial pressure of 3 mmHg.   IAS/Shunts: No atrial level shunt detected by color flow Doppler.      LEFT VENTRICLE  PLAX 2D  LVIDd:         3.60 cm   Diastology  LVIDs:         2.80 cm   LV e' medial:  Discharge Summary    Patient ID: Anne [name] MRN: [id number]; DOB: [birth year]  Admit date: 02/20/2021 Discharge date: 02/21/2021  PCP:  [name], MD   [facility] Providers Cardiologist:  Sinclair Grooms, MD        Discharge Diagnoses    Principal Problem:   Near syncope Active Problems:   Chest pressure   Essential hypertension   PVC's (premature ventricular contractions) symptomatic   Sinus bradycardia    Diagnostic Studies/Procedures    Echo 02/20/21 IMPRESSIONS     1. Left ventricular ejection fraction, by estimation, is 60 to 65%. The  left ventricle has normal function. The left ventricle has no regional  wall motion abnormalities. Left ventricular diastolic parameters are  consistent with Grade I diastolic  dysfunction (impaired relaxation).   2. Right ventricular systolic function is normal. The right ventricular  size is normal. There is normal pulmonary artery systolic pressure.   3. The mitral valve is grossly normal. Trivial mitral valve  regurgitation.   4. The aortic valve is tricuspid. Aortic valve regurgitation is not  visualized. Aortic valve sclerosis is present, with no evidence of aortic  valve stenosis.   5. The inferior vena cava is normal in size with greater than 50%  respiratory variability, suggesting right atrial pressure of 3 mmHg.   Comparison(s): No prior Echocardiogram.   FINDINGS   Left Ventricle: Left ventricular ejection fraction, by estimation, is 60  to 65%. The left ventricle has normal function. The left ventricle has no  regional wall motion abnormalities. The left ventricular internal cavity  size was normal in size. There is   no left ventricular hypertrophy. Left ventricular diastolic parameters  are consistent with Grade I diastolic dysfunction (impaired relaxation).  Indeterminate filling pressures.   Right Ventricle: The right ventricular size is normal. No increase in  right ventricular wall  thickness. Right ventricular systolic function is  normal. There is normal pulmonary artery systolic pressure. The tricuspid  regurgitant velocity is 2.71 m/s, and   with an assumed right atrial pressure of 3 mmHg, the estimated right  ventricular systolic pressure is 95.1 mmHg.   Left Atrium: Left atrial size was normal in size.   Right Atrium: Right atrial size was normal in size.   Pericardium: There is no evidence of pericardial effusion.   Mitral Valve: The mitral valve is grossly normal. Trivial mitral valve  regurgitation.   Tricuspid Valve: The tricuspid valve is grossly normal. Tricuspid valve  regurgitation is mild.   Aortic Valve: The aortic valve is tricuspid. Aortic valve regurgitation is  not visualized. Aortic valve sclerosis is present, with no evidence of  aortic valve stenosis. Aortic valve mean gradient measures 4.0 mmHg.  Aortic valve peak gradient measures 5.9   mmHg. Aortic valve area, by VTI measures 2.26 cm.   Pulmonic Valve: The pulmonic valve was not well visualized. Pulmonic valve  regurgitation is not visualized.   Aorta: The aortic root and ascending aorta are structurally normal, with  no evidence of dilitation.   Venous: The inferior vena cava is normal in size with greater than 50%  respiratory variability, suggesting right atrial pressure of 3 mmHg.   IAS/Shunts: No atrial level shunt detected by color flow Doppler.      LEFT VENTRICLE  PLAX 2D  LVIDd:         3.60 cm   Diastology  LVIDs:         2.80 cm   LV e' medial:  50% left main). Cardiac catheterization or CT FFR is recommended. Consider symptom-guided anti-ischemic pharmacotherapy as well as risk factor modification per guideline directed care. Invasive coronary angiography recommended with revascularization per published guideline statements. 6. CAD-RADS 5: Total coronary occlusion (100%). Consider cardiac catheterization or viability assessment. Consider symptom-guided anti-ischemic pharmacotherapy as well as risk factor modification per guideline directed care. 7. CAD-RADS N: Non-diagnostic study. Obstructive CAD can't be excluded. Alternative evaluation is recommended. Electronically Signed   By: Buford Dresser M.D.   On: 02/21/2021 11:40   Result Date: 02/21/2021 EXAM: OVER-READ INTERPRETATION  CT CHEST The following report is an over-read performed by radiologist Dr. Rolm Baptise of [facility], [place] on 02/21/2021. This over-read does not include interpretation of cardiac or coronary anatomy or pathology. The coronary CTA interpretation by the cardiologist is attached. COMPARISON:  11/19/2014 FINDINGS: Vascular: Heart is normal size. Aorta normal caliber. Scattered aortic calcifications. Mediastinum/Nodes: No adenopathy Lungs/Pleura: No confluent airspace opacities or effusions. Upper Abdomen: Imaging into the upper abdomen demonstrates no acute findings. Musculoskeletal: Chest wall soft tissues are unremarkable. No acute bony abnormality. IMPRESSION: Scattered aortic atherosclerosis. No acute extra cardiac abnormality. Electronically Signed: By: Rolm Baptise M.D. On: 02/21/2021 10:38   DG Chest Port 1 View  Result Date: 02/20/2021 CLINICAL DATA:  Chest pain EXAM: PORTABLE CHEST 1 VIEW COMPARISON:   08/17/2019 FINDINGS: The heart size and mediastinal contours are within normal limits. Aortic atherosclerosis. No focal airspace consolidation, pleural effusion, or pneumothorax. The visualized skeletal structures are unremarkable. IMPRESSION: No active disease. Electronically Signed   By: Davina Poke D.O.   On: 02/20/2021 11:08   ECHOCARDIOGRAM COMPLETE  Result Date: 02/20/2021    ECHOCARDIOGRAM REPORT   Patient Name:   [facility] Date of Exam: 02/20/2021 Medical Rec #:  [id number]           Height:       67.0 in Accession #:    [id number]          Weight:       158.0 lb Date of Birth:  26-Apr-1930           BSA:          1.829 m Patient Age:    87 years            BP:           154/79 mmHg Patient Gender: F                   HR:           68 bpm. Exam Location:  Inpatient Procedure: 2D Echo, Cardiac Doppler and Color Doppler Indications:    Chest pain  History:        Patient has no prior history of Echocardiogram examinations.                 Risk Factors:Dyslipidemia.  Sonographer:    Maudry Mayhew MHA, RDMS, RVT, RDCS Referring Phys: [id number] PAULA V ROSS  Sonographer Comments: Technically difficult study due to poor echo windows. IMPRESSIONS  1. Left ventricular ejection fraction, by estimation, is 60 to 65%. The left ventricle has normal function. The left ventricle has no regional wall motion abnormalities. Left ventricular diastolic parameters are consistent with Grade I diastolic dysfunction (impaired relaxation).  2. Right ventricular systolic function is normal. The right ventricular size is normal. There is normal pulmonary artery systolic pressure.  3. The mitral valve is grossly normal. Trivial mitral valve regurgitation.  4. The  Discharge Summary    Patient ID: Anne [name] MRN: [id number]; DOB: [birth year]  Admit date: 02/20/2021 Discharge date: 02/21/2021  PCP:  [name], MD   [facility] Providers Cardiologist:  Sinclair Grooms, MD        Discharge Diagnoses    Principal Problem:   Near syncope Active Problems:   Chest pressure   Essential hypertension   PVC's (premature ventricular contractions) symptomatic   Sinus bradycardia    Diagnostic Studies/Procedures    Echo 02/20/21 IMPRESSIONS     1. Left ventricular ejection fraction, by estimation, is 60 to 65%. The  left ventricle has normal function. The left ventricle has no regional  wall motion abnormalities. Left ventricular diastolic parameters are  consistent with Grade I diastolic  dysfunction (impaired relaxation).   2. Right ventricular systolic function is normal. The right ventricular  size is normal. There is normal pulmonary artery systolic pressure.   3. The mitral valve is grossly normal. Trivial mitral valve  regurgitation.   4. The aortic valve is tricuspid. Aortic valve regurgitation is not  visualized. Aortic valve sclerosis is present, with no evidence of aortic  valve stenosis.   5. The inferior vena cava is normal in size with greater than 50%  respiratory variability, suggesting right atrial pressure of 3 mmHg.   Comparison(s): No prior Echocardiogram.   FINDINGS   Left Ventricle: Left ventricular ejection fraction, by estimation, is 60  to 65%. The left ventricle has normal function. The left ventricle has no  regional wall motion abnormalities. The left ventricular internal cavity  size was normal in size. There is   no left ventricular hypertrophy. Left ventricular diastolic parameters  are consistent with Grade I diastolic dysfunction (impaired relaxation).  Indeterminate filling pressures.   Right Ventricle: The right ventricular size is normal. No increase in  right ventricular wall  thickness. Right ventricular systolic function is  normal. There is normal pulmonary artery systolic pressure. The tricuspid  regurgitant velocity is 2.71 m/s, and   with an assumed right atrial pressure of 3 mmHg, the estimated right  ventricular systolic pressure is 95.1 mmHg.   Left Atrium: Left atrial size was normal in size.   Right Atrium: Right atrial size was normal in size.   Pericardium: There is no evidence of pericardial effusion.   Mitral Valve: The mitral valve is grossly normal. Trivial mitral valve  regurgitation.   Tricuspid Valve: The tricuspid valve is grossly normal. Tricuspid valve  regurgitation is mild.   Aortic Valve: The aortic valve is tricuspid. Aortic valve regurgitation is  not visualized. Aortic valve sclerosis is present, with no evidence of  aortic valve stenosis. Aortic valve mean gradient measures 4.0 mmHg.  Aortic valve peak gradient measures 5.9   mmHg. Aortic valve area, by VTI measures 2.26 cm.   Pulmonic Valve: The pulmonic valve was not well visualized. Pulmonic valve  regurgitation is not visualized.   Aorta: The aortic root and ascending aorta are structurally normal, with  no evidence of dilitation.   Venous: The inferior vena cava is normal in size with greater than 50%  respiratory variability, suggesting right atrial pressure of 3 mmHg.   IAS/Shunts: No atrial level shunt detected by color flow Doppler.      LEFT VENTRICLE  PLAX 2D  LVIDd:         3.60 cm   Diastology  LVIDs:         2.80 cm   LV e' medial:

## 2021-02-21 NOTE — ED Notes (Signed)
Pt w/ multiple questions regarding plan of care. Updated pt on plan of care. Cardiac Imaging RN at bedside to place diffusix for Coronary CT.

## 2021-02-21 NOTE — Progress Notes (Signed)
Error

## 2021-02-22 ENCOUNTER — Telehealth: Payer: Self-pay | Admitting: Cardiology

## 2021-02-22 NOTE — Telephone Encounter (Signed)
Called pt in regards to NTG.  Pt reports she is having palpitations in the center of her chest.  It does not feel like Chest pain just feels funny.  Denies arm/ neck pain and nausea. Pt released from hospital recently.  Per ED report pt noted having ventricular bigeminy.  Ordered to take metoprolol 12.5 mg PO BID and has not yet started medication. I advised pt to take medication.  I advised her that medication will help suppress funny heart beats.  I also advised pt to monitor BP to ensure BP does not drop low.  Pt expresses she will take metoprolol now.  All questions answered and pt verbalizes understanding.

## 2021-02-22 NOTE — Telephone Encounter (Signed)
  Pt c/o medication issue:  1. Name of Medication: nitroGLYCERIN (NITROSTAT) 0.4 MG SL tablet  2. How are you currently taking this medication (dosage and times per day)? Place 1 tablet (0.4 mg total) under the tongue every 5 (five) minutes as needed for chest pain (Up to 3 doses in 15 minutes.).  3. Are you having a reaction (difficulty breathing--STAT)?   4. What is your medication issue? Pt has question about this meds.

## 2021-02-28 ENCOUNTER — Telehealth: Payer: Self-pay | Admitting: Interventional Cardiology

## 2021-02-28 NOTE — Telephone Encounter (Signed)
Its feels congested like there is something in the center not moving. Upper center of chest. Described as burning. Not changes with deep breathing or activity. It feels different than when she went into the hospital, not better or worse just different. Patient has been taking new mediations prescribed, crestor, aspirin, and metoprolol tartrate. 130/49 54. 129/61 57. 6-7 scoring on 0-10 scale.   The patient has taken nitro SL x1. Scale now a 4.  135/53 56  SL 2nd tablet 121/52 60. Scale is now a 0.   Patient is going to eat lunch and rest. States she is feeling better now and appreciative of time taken with patient to educate and discuss testing that was done in hospital, along with new medications.

## 2021-02-28 NOTE — Telephone Encounter (Signed)
Pt c/o of Chest Pain: STAT if CP now or developed within 24 hours  1. Are you having CP right now? yes  2. Are you experiencing any other symptoms (ex. SOB, nausea, vomiting, sweating)? no  3. How long have you been experiencing CP? Last week had chest pain, but yesterday it felt different and feels worse today  4. Is your CP continuous or coming and going? continuous  5. Have you taken Nitroglycerin? no ?   Patient states she has a congestion or tightness feeling in her chest. She states it feels like a lump is there in the center. She says she was in the hospital for chest pain last week, but this feels different and started yesterday. She says she did have a scratchy throat, but it went away. She says her temperature is okay and was tested for covid, but did not hear back. She says she is not sure it is a chest cold or chest pain and was not sure when to take nitroglycerin.

## 2021-03-01 ENCOUNTER — Telehealth: Payer: Self-pay | Admitting: Internal Medicine

## 2021-03-01 ENCOUNTER — Other Ambulatory Visit: Payer: Self-pay | Admitting: Internal Medicine

## 2021-03-02 MED ORDER — ALBUTEROL SULFATE HFA 108 (90 BASE) MCG/ACT IN AERS: INHALATION_SPRAY | RESPIRATORY_TRACT | 3 refills | Status: DC

## 2021-03-02 NOTE — Telephone Encounter (Signed)
Called and spoke with pt and explained to her how she is supposed to be using her breo and albuterol inhalers and she verbalized understanding. Pt said she needed a refill of her albuterol inhaler so this has been sent to pharmacy for her. Nothing further needed.

## 2021-03-06 ENCOUNTER — Encounter: Payer: Self-pay | Admitting: Radiology

## 2021-03-06 ENCOUNTER — Telehealth: Payer: Self-pay | Admitting: Interventional Cardiology

## 2021-03-06 DIAGNOSIS — R002 Palpitations: Secondary | ICD-10-CM

## 2021-03-06 NOTE — Telephone Encounter (Signed)
Patient c/o Palpitations:  High priority if patient c/o lightheadedness, shortness of breath, or chest pain  How long have you had palpitations/irregular HR/ Afib? Are you having the symptoms now? THIS MORNING AROUND 3AM BUT PT IS NOT HAVING THE PALPATIONS NOW  Are you currently experiencing lightheadedness, SOB or CP? NO CP JUST FEELING DROWSY   Do you have a history of afib (atrial fibrillation) or irregular heart rhythm? NO  Have you checked your BP or HR? (document readings if available):  172/72 P 56  Are you experiencing any other symptoms?  DROWSY, PT TOOK 2 NITRO AND STARTED FEELING BETTER.   PT WANTS TO KNOW HOW OFTEN SHE SHOULD BE TAKING THE NITRO

## 2021-03-06 NOTE — Progress Notes (Signed)
Enrolled patient for a 30 day [employer] Event Monitor to be mailed to patients home  

## 2021-03-06 NOTE — Telephone Encounter (Signed)
Pt states she woke up around 3am this morning having palpitations.  Checked her vitals and they were 172/72, 66.  Took 2 nitro and they "helped some".  Vitals came down to 107/52, 60,  Denies CP, swelling, dizziness or other issues.  States it feels like she is congested all the time since she got out of the hospital.  Gets SOB with these episodes.  Denies coughing.  Spoke with Dr. Tamala Julian and he said ok to have pt wear a monitor for 4 weeks.  Advised pt of this information and she is agreeable to plan.

## 2021-03-08 DIAGNOSIS — I10 Essential (primary) hypertension: Secondary | ICD-10-CM | POA: Diagnosis not present

## 2021-03-08 DIAGNOSIS — N1831 Chronic kidney disease, stage 3a: Secondary | ICD-10-CM | POA: Diagnosis not present

## 2021-03-08 DIAGNOSIS — I498 Other specified cardiac arrhythmias: Secondary | ICD-10-CM | POA: Diagnosis not present

## 2021-03-08 DIAGNOSIS — R12 Heartburn: Secondary | ICD-10-CM | POA: Diagnosis not present

## 2021-03-09 ENCOUNTER — Telehealth: Payer: Self-pay | Admitting: Interventional Cardiology

## 2021-03-09 ENCOUNTER — Ambulatory Visit: Payer: Medicare PPO | Admitting: Adult Health

## 2021-03-09 ENCOUNTER — Encounter: Payer: Self-pay | Admitting: *Deleted

## 2021-03-09 ENCOUNTER — Ambulatory Visit (INDEPENDENT_AMBULATORY_CARE_PROVIDER_SITE_OTHER): Payer: Medicare PPO

## 2021-03-09 ENCOUNTER — Encounter: Payer: Self-pay | Admitting: Adult Health

## 2021-03-09 ENCOUNTER — Other Ambulatory Visit: Payer: Self-pay

## 2021-03-09 DIAGNOSIS — R0600 Dyspnea, unspecified: Secondary | ICD-10-CM

## 2021-03-09 DIAGNOSIS — J45901 Unspecified asthma with (acute) exacerbation: Secondary | ICD-10-CM | POA: Diagnosis not present

## 2021-03-09 DIAGNOSIS — R002 Palpitations: Secondary | ICD-10-CM

## 2021-03-09 MED ORDER — PREDNISONE 20 MG PO TABS: 20.0000 mg | ORAL_TABLET | Freq: Every day | ORAL | 0 refills | Status: DC

## 2021-03-09 NOTE — Assessment & Plan Note (Signed)
Possible due to asthma flare  Recent cardiac workup - Coronary CT /echo stable  Cardiac monitor pending   Keep cards follow up   Plan  Patient Instructions  Continue on BREO 1 puff daily , rinse after use.  Albuterol inhaler As needed   Begin Prilosec 20mg  daily . (Over the counter)  Liquid Mucinex Twice daily  As needed  cough/congestion  Prednisone 20mg  daily for 3 days -take with food  Follow with Dr. Chase Caller or Kallan Merrick NP in 6 weeks and As needed   Please contact office for sooner follow up if symptoms do not improve or worsen or seek emergency care

## 2021-03-09 NOTE — Progress Notes (Unsigned)
Patient enrolled for [employer] to ship a cardiac event monitor to her home. \ [id number] mailed to patient , applied in office 03/09/21.  Patient given white strips along with bridge and sensitive skin electrodes.

## 2021-03-09 NOTE — Patient Instructions (Addendum)
Continue on BREO 1 puff daily , rinse after use.  Albuterol inhaler As needed   Begin Prilosec 20mg  daily . (Over the counter)  Liquid Mucinex Twice daily  As needed  cough/congestion  Prednisone 20mg  daily for 3 days -take with food  Follow with Dr. Chase Caller or Medardo Hassing NP in 6 weeks and As needed   Please contact office for sooner follow up if symptoms do not improve or worsen or seek emergency care

## 2021-03-09 NOTE — Progress Notes (Signed)
anti-ischemic pharmacotherapy as well as risk factor modification per guideline directed care. Invasive coronary angiography recommended with revascularization per published guideline statements. 6. CAD-RADS 5: Total coronary occlusion (100%). Consider cardiac catheterization or viability assessment. Consider symptom-guided anti-ischemic pharmacotherapy as well as risk factor modification per guideline directed care. 7. CAD-RADS N: Non-diagnostic study. Obstructive CAD can't be excluded. Alternative evaluation is recommended. Electronically Signed   By: Buford Dresser M.D.   On: 02/21/2021 11:40   Result Date: 02/21/2021 EXAM: OVER-READ INTERPRETATION  CT CHEST The following report is an over-read performed by radiologist Dr. Rolm Baptise of [facility], [place] on 02/21/2021. This over-read does not include interpretation of cardiac or coronary anatomy or pathology. The coronary CTA interpretation by the cardiologist is attached. COMPARISON:  11/19/2014 FINDINGS: Vascular: Heart is normal size. Aorta normal caliber. Scattered aortic calcifications. Mediastinum/Nodes: No adenopathy Lungs/Pleura: No confluent airspace opacities or effusions. Upper Abdomen: Imaging into the upper abdomen demonstrates no acute findings. Musculoskeletal: Chest wall soft tissues are unremarkable. No acute bony abnormality.  IMPRESSION: Scattered aortic atherosclerosis. No acute extra cardiac abnormality. Electronically Signed: By: Rolm Baptise M.D. On: 02/21/2021 10:38   DG Chest Port 1 View  Result Date: 02/20/2021 CLINICAL DATA:  Chest pain EXAM: PORTABLE CHEST 1 VIEW COMPARISON:  08/17/2019 FINDINGS: The heart size and mediastinal contours are within normal limits. Aortic atherosclerosis. No focal airspace consolidation, pleural effusion, or pneumothorax. The visualized skeletal structures are unremarkable. IMPRESSION: No active disease. Electronically Signed   By: Davina Poke D.O.   On: 02/20/2021 11:08   CT CORONARY FRACTIONAL FLOW RESERVE DATA PREP  Result Date: 02/21/2021 EXAM: CT FFR ANALYSIS CLINICAL DATA:  Chest pain, nonspecific FINDINGS: FFRct analysis was performed on the original cardiac CT angiogram dataset. Diagrammatic representation of the FFRct analysis is provided in a separate PDF document in PACS. This dictation was created using the PDF document and an interactive 3D model of the results. 3D model is not available in the EMR/PACS. Normal FFR range is >0.80. 1. Left Main:  No significant stenosis. FFR = 0.97 2. LAD: No significant stenosis. Proximal FFR = 0.95, Mid FFR = 0.93, Distal FFR = 0.83 3. LCX: No significant stenosis. Proximal FFR = 0.92, Distal FFR = 0.86 4. RCA: No significant stenosis. Proximal FFR = 0.96, Mid FFR = 0.89, Distal FFR = 0.87 IMPRESSION: 1.  CT FFR analysis did not show any significant stenosis. Electronically Signed   By: Buford Dresser M.D.   On: 02/21/2021 14:30   ECHOCARDIOGRAM COMPLETE  Result Date: 02/20/2021    ECHOCARDIOGRAM REPORT   Patient Name:   Anne [name] Anne [name] Date of Exam: 02/20/2021 Medical Rec #:  [id number]           Height:       67.0 in Accession #:    [id number]          Weight:       158.0 lb Date of Birth:  09/07/1930           BSA:          1.829 m Patient Age:    87 years            BP:           154/79 mmHg Patient Gender: F                    HR:           68 bpm. Exam Location:  Inpatient Procedure: 2D Echo,  @Patient  ID: Anne [name], female    DOB: 08/16/1930, 87 y.o.   MRN: [id number]  Chief Complaint  Patient presents with   Follow-up    Patient says she's been having shortness of breath really bad. Started 3 days ago.    Referring provider: [name], MD  HPI: 87 yo female never smoker followed for Asthma  Medical history positive for D CHF  History of chronic RML collapse/Lymphadenopathy s/p FOB + Pseudomonas 07/2007    TEST/EVENTS :  Exhaled nitric oxide test May 2018: 17 ppb High resolution CT chest August 2016 showed no evidence of interstitial lung disease, chronic collapse of right middle lobe MEdiastinaoscopy 07/27/2009 - Benign node of 4R and 4L. ANthracotoic pigment +. No granuoma. Non malignant PFT 10/28/2014 FEV1 94, ratio 54, FVC 135, no significant bronchodilator response, mid flows reversibility, DLCO 65%   03/09/2021 Follow up : Asthma  Patient complains of 3 days of increased cough and congestion. Mucus is thick and hard to get up but remains clear. Remains active. Drives and lives alone at home . Has some mild sinus drainage  Remains on BREO daily . No increased Albuterol inhaler use. No wheezing .  No fever,edema , abd pain , n/v/d. Does have some epigastric burning from time to time. No dysphagia.   Recent cardiac workup last month . Echo 02/20/21 showed EF 60%, Grade 1 DD ,Normal PAP  Chest xray nad Coronary CT with no lung issues , felt CAD stable -no significant stenosis per cards note.  Placed on PPI from PCP but has not started. Cardiac monitor to be placed.    Allergies  Allergen Reactions   Other Shortness Of Breath    Pt states she has asthma and seafood causes breathing problems.    Shellfish Allergy Shortness Of Breath   Latex Itching   Adhesive [Tape] Rash    Immunization History  Administered Date(s) Administered   Influenza Split 12/21/2009, 01/09/2017, 12/25/2017   Influenza Whole 04/04/2009   Influenza, High Dose Seasonal PF  01/12/2015, 01/18/2016, 12/31/2016, 12/17/2017, 12/02/2018, 12/31/2018, 12/08/2019   Influenza,inj,Quad PF,6+ Mos 01/03/2015, 12/02/2015   Influenza-Unspecified 02/14/2014, 01/13/2021   PFIZER Comirnaty(Gray Top)Covid-19 Tri-Sucrose Vaccine 09/01/2020   PFIZER(Purple Top)SARS-COV-2 Vaccination 04/24/2019, 05/15/2019, 12/29/2019   Pneumococcal Conjugate-13 01/12/2015   Pneumococcal Polysaccharide-23 08/04/1997, 06/01/2003, 01/30/2017   Pneumococcal-Unspecified 01/03/2015   Tdap 09/20/2015   Zoster, Live 07/06/2013, 01/11/2020, 07/11/2020    Past Medical History:  Diagnosis Date   Allergic rhinitis    Arthritis    Asthma    Atypical chest pain    Collapse of right lung    Dyslipidemia    Gastroesophageal reflux disease    Glaucoma    Hematuria    Hyperlipidemia    Lung nodules    PVC's (premature ventricular contractions) 02/21/2021   Sinus bradycardia 02/21/2021    Tobacco History: Social History   Tobacco Use  Smoking Status Former   Years: 4.00   Types: Cigarettes   Start date: 1953   Quit date: 1960   Years since quitting: 62.9  Smokeless Tobacco Never  Tobacco Comments   social smoker   Counseling given: Not Answered Tobacco comments: social smoker   Outpatient Medications Prior to Visit  Medication Sig Dispense Refill   albuterol (PROAIR HFA) 108 (90 Base) MCG/ACT inhaler INHALE 1-2 PUFFS INTO THE LUNGS EVERY 6 (SIX) HOURS AS NEEDED FOR WHEEZING OR SHORTNESS OF BREATH. 18 g 3   aspirin EC 81 MG EC tablet Take 1 tablet (81 mg  anti-ischemic pharmacotherapy as well as risk factor modification per guideline directed care. Invasive coronary angiography recommended with revascularization per published guideline statements. 6. CAD-RADS 5: Total coronary occlusion (100%). Consider cardiac catheterization or viability assessment. Consider symptom-guided anti-ischemic pharmacotherapy as well as risk factor modification per guideline directed care. 7. CAD-RADS N: Non-diagnostic study. Obstructive CAD can't be excluded. Alternative evaluation is recommended. Electronically Signed   By: Buford Dresser M.D.   On: 02/21/2021 11:40   Result Date: 02/21/2021 EXAM: OVER-READ INTERPRETATION  CT CHEST The following report is an over-read performed by radiologist Dr. Rolm Baptise of [facility], [place] on 02/21/2021. This over-read does not include interpretation of cardiac or coronary anatomy or pathology. The coronary CTA interpretation by the cardiologist is attached. COMPARISON:  11/19/2014 FINDINGS: Vascular: Heart is normal size. Aorta normal caliber. Scattered aortic calcifications. Mediastinum/Nodes: No adenopathy Lungs/Pleura: No confluent airspace opacities or effusions. Upper Abdomen: Imaging into the upper abdomen demonstrates no acute findings. Musculoskeletal: Chest wall soft tissues are unremarkable. No acute bony abnormality.  IMPRESSION: Scattered aortic atherosclerosis. No acute extra cardiac abnormality. Electronically Signed: By: Rolm Baptise M.D. On: 02/21/2021 10:38   DG Chest Port 1 View  Result Date: 02/20/2021 CLINICAL DATA:  Chest pain EXAM: PORTABLE CHEST 1 VIEW COMPARISON:  08/17/2019 FINDINGS: The heart size and mediastinal contours are within normal limits. Aortic atherosclerosis. No focal airspace consolidation, pleural effusion, or pneumothorax. The visualized skeletal structures are unremarkable. IMPRESSION: No active disease. Electronically Signed   By: Davina Poke D.O.   On: 02/20/2021 11:08   CT CORONARY FRACTIONAL FLOW RESERVE DATA PREP  Result Date: 02/21/2021 EXAM: CT FFR ANALYSIS CLINICAL DATA:  Chest pain, nonspecific FINDINGS: FFRct analysis was performed on the original cardiac CT angiogram dataset. Diagrammatic representation of the FFRct analysis is provided in a separate PDF document in PACS. This dictation was created using the PDF document and an interactive 3D model of the results. 3D model is not available in the EMR/PACS. Normal FFR range is >0.80. 1. Left Main:  No significant stenosis. FFR = 0.97 2. LAD: No significant stenosis. Proximal FFR = 0.95, Mid FFR = 0.93, Distal FFR = 0.83 3. LCX: No significant stenosis. Proximal FFR = 0.92, Distal FFR = 0.86 4. RCA: No significant stenosis. Proximal FFR = 0.96, Mid FFR = 0.89, Distal FFR = 0.87 IMPRESSION: 1.  CT FFR analysis did not show any significant stenosis. Electronically Signed   By: Buford Dresser M.D.   On: 02/21/2021 14:30   ECHOCARDIOGRAM COMPLETE  Result Date: 02/20/2021    ECHOCARDIOGRAM REPORT   Patient Name:   Anne [name] Anne [name] Date of Exam: 02/20/2021 Medical Rec #:  [id number]           Height:       67.0 in Accession #:    [id number]          Weight:       158.0 lb Date of Birth:  09/07/1930           BSA:          1.829 m Patient Age:    87 years            BP:           154/79 mmHg Patient Gender: F                    HR:           68 bpm. Exam Location:  Inpatient Procedure: 2D Echo,  @Patient  ID: Anne [name], female    DOB: 08/16/1930, 87 y.o.   MRN: [id number]  Chief Complaint  Patient presents with   Follow-up    Patient says she's been having shortness of breath really bad. Started 3 days ago.    Referring provider: [name], MD  HPI: 87 yo female never smoker followed for Asthma  Medical history positive for D CHF  History of chronic RML collapse/Lymphadenopathy s/p FOB + Pseudomonas 07/2007    TEST/EVENTS :  Exhaled nitric oxide test May 2018: 17 ppb High resolution CT chest August 2016 showed no evidence of interstitial lung disease, chronic collapse of right middle lobe MEdiastinaoscopy 07/27/2009 - Benign node of 4R and 4L. ANthracotoic pigment +. No granuoma. Non malignant PFT 10/28/2014 FEV1 94, ratio 54, FVC 135, no significant bronchodilator response, mid flows reversibility, DLCO 65%   03/09/2021 Follow up : Asthma  Patient complains of 3 days of increased cough and congestion. Mucus is thick and hard to get up but remains clear. Remains active. Drives and lives alone at home . Has some mild sinus drainage  Remains on BREO daily . No increased Albuterol inhaler use. No wheezing .  No fever,edema , abd pain , n/v/d. Does have some epigastric burning from time to time. No dysphagia.   Recent cardiac workup last month . Echo 02/20/21 showed EF 60%, Grade 1 DD ,Normal PAP  Chest xray nad Coronary CT with no lung issues , felt CAD stable -no significant stenosis per cards note.  Placed on PPI from PCP but has not started. Cardiac monitor to be placed.    Allergies  Allergen Reactions   Other Shortness Of Breath    Pt states she has asthma and seafood causes breathing problems.    Shellfish Allergy Shortness Of Breath   Latex Itching   Adhesive [Tape] Rash    Immunization History  Administered Date(s) Administered   Influenza Split 12/21/2009, 01/09/2017, 12/25/2017   Influenza Whole 04/04/2009   Influenza, High Dose Seasonal PF  01/12/2015, 01/18/2016, 12/31/2016, 12/17/2017, 12/02/2018, 12/31/2018, 12/08/2019   Influenza,inj,Quad PF,6+ Mos 01/03/2015, 12/02/2015   Influenza-Unspecified 02/14/2014, 01/13/2021   PFIZER Comirnaty(Gray Top)Covid-19 Tri-Sucrose Vaccine 09/01/2020   PFIZER(Purple Top)SARS-COV-2 Vaccination 04/24/2019, 05/15/2019, 12/29/2019   Pneumococcal Conjugate-13 01/12/2015   Pneumococcal Polysaccharide-23 08/04/1997, 06/01/2003, 01/30/2017   Pneumococcal-Unspecified 01/03/2015   Tdap 09/20/2015   Zoster, Live 07/06/2013, 01/11/2020, 07/11/2020    Past Medical History:  Diagnosis Date   Allergic rhinitis    Arthritis    Asthma    Atypical chest pain    Collapse of right lung    Dyslipidemia    Gastroesophageal reflux disease    Glaucoma    Hematuria    Hyperlipidemia    Lung nodules    PVC's (premature ventricular contractions) 02/21/2021   Sinus bradycardia 02/21/2021    Tobacco History: Social History   Tobacco Use  Smoking Status Former   Years: 4.00   Types: Cigarettes   Start date: 1953   Quit date: 1960   Years since quitting: 62.9  Smokeless Tobacco Never  Tobacco Comments   social smoker   Counseling given: Not Answered Tobacco comments: social smoker   Outpatient Medications Prior to Visit  Medication Sig Dispense Refill   albuterol (PROAIR HFA) 108 (90 Base) MCG/ACT inhaler INHALE 1-2 PUFFS INTO THE LUNGS EVERY 6 (SIX) HOURS AS NEEDED FOR WHEEZING OR SHORTNESS OF BREATH. 18 g 3   aspirin EC 81 MG EC tablet Take 1 tablet (81 mg  @Patient  ID: Anne [name], female    DOB: 08/16/1930, 87 y.o.   MRN: [id number]  Chief Complaint  Patient presents with   Follow-up    Patient says she's been having shortness of breath really bad. Started 3 days ago.    Referring provider: [name], MD  HPI: 87 yo female never smoker followed for Asthma  Medical history positive for D CHF  History of chronic RML collapse/Lymphadenopathy s/p FOB + Pseudomonas 07/2007    TEST/EVENTS :  Exhaled nitric oxide test May 2018: 17 ppb High resolution CT chest August 2016 showed no evidence of interstitial lung disease, chronic collapse of right middle lobe MEdiastinaoscopy 07/27/2009 - Benign node of 4R and 4L. ANthracotoic pigment +. No granuoma. Non malignant PFT 10/28/2014 FEV1 94, ratio 54, FVC 135, no significant bronchodilator response, mid flows reversibility, DLCO 65%   03/09/2021 Follow up : Asthma  Patient complains of 3 days of increased cough and congestion. Mucus is thick and hard to get up but remains clear. Remains active. Drives and lives alone at home . Has some mild sinus drainage  Remains on BREO daily . No increased Albuterol inhaler use. No wheezing .  No fever,edema , abd pain , n/v/d. Does have some epigastric burning from time to time. No dysphagia.   Recent cardiac workup last month . Echo 02/20/21 showed EF 60%, Grade 1 DD ,Normal PAP  Chest xray nad Coronary CT with no lung issues , felt CAD stable -no significant stenosis per cards note.  Placed on PPI from PCP but has not started. Cardiac monitor to be placed.    Allergies  Allergen Reactions   Other Shortness Of Breath    Pt states she has asthma and seafood causes breathing problems.    Shellfish Allergy Shortness Of Breath   Latex Itching   Adhesive [Tape] Rash    Immunization History  Administered Date(s) Administered   Influenza Split 12/21/2009, 01/09/2017, 12/25/2017   Influenza Whole 04/04/2009   Influenza, High Dose Seasonal PF  01/12/2015, 01/18/2016, 12/31/2016, 12/17/2017, 12/02/2018, 12/31/2018, 12/08/2019   Influenza,inj,Quad PF,6+ Mos 01/03/2015, 12/02/2015   Influenza-Unspecified 02/14/2014, 01/13/2021   PFIZER Comirnaty(Gray Top)Covid-19 Tri-Sucrose Vaccine 09/01/2020   PFIZER(Purple Top)SARS-COV-2 Vaccination 04/24/2019, 05/15/2019, 12/29/2019   Pneumococcal Conjugate-13 01/12/2015   Pneumococcal Polysaccharide-23 08/04/1997, 06/01/2003, 01/30/2017   Pneumococcal-Unspecified 01/03/2015   Tdap 09/20/2015   Zoster, Live 07/06/2013, 01/11/2020, 07/11/2020    Past Medical History:  Diagnosis Date   Allergic rhinitis    Arthritis    Asthma    Atypical chest pain    Collapse of right lung    Dyslipidemia    Gastroesophageal reflux disease    Glaucoma    Hematuria    Hyperlipidemia    Lung nodules    PVC's (premature ventricular contractions) 02/21/2021   Sinus bradycardia 02/21/2021    Tobacco History: Social History   Tobacco Use  Smoking Status Former   Years: 4.00   Types: Cigarettes   Start date: 1953   Quit date: 1960   Years since quitting: 62.9  Smokeless Tobacco Never  Tobacco Comments   social smoker   Counseling given: Not Answered Tobacco comments: social smoker   Outpatient Medications Prior to Visit  Medication Sig Dispense Refill   albuterol (PROAIR HFA) 108 (90 Base) MCG/ACT inhaler INHALE 1-2 PUFFS INTO THE LUNGS EVERY 6 (SIX) HOURS AS NEEDED FOR WHEEZING OR SHORTNESS OF BREATH. 18 g 3   aspirin EC 81 MG EC tablet Take 1 tablet (81 mg  anti-ischemic pharmacotherapy as well as risk factor modification per guideline directed care. Invasive coronary angiography recommended with revascularization per published guideline statements. 6. CAD-RADS 5: Total coronary occlusion (100%). Consider cardiac catheterization or viability assessment. Consider symptom-guided anti-ischemic pharmacotherapy as well as risk factor modification per guideline directed care. 7. CAD-RADS N: Non-diagnostic study. Obstructive CAD can't be excluded. Alternative evaluation is recommended. Electronically Signed   By: Buford Dresser M.D.   On: 02/21/2021 11:40   Result Date: 02/21/2021 EXAM: OVER-READ INTERPRETATION  CT CHEST The following report is an over-read performed by radiologist Dr. Rolm Baptise of [facility], [place] on 02/21/2021. This over-read does not include interpretation of cardiac or coronary anatomy or pathology. The coronary CTA interpretation by the cardiologist is attached. COMPARISON:  11/19/2014 FINDINGS: Vascular: Heart is normal size. Aorta normal caliber. Scattered aortic calcifications. Mediastinum/Nodes: No adenopathy Lungs/Pleura: No confluent airspace opacities or effusions. Upper Abdomen: Imaging into the upper abdomen demonstrates no acute findings. Musculoskeletal: Chest wall soft tissues are unremarkable. No acute bony abnormality.  IMPRESSION: Scattered aortic atherosclerosis. No acute extra cardiac abnormality. Electronically Signed: By: Rolm Baptise M.D. On: 02/21/2021 10:38   DG Chest Port 1 View  Result Date: 02/20/2021 CLINICAL DATA:  Chest pain EXAM: PORTABLE CHEST 1 VIEW COMPARISON:  08/17/2019 FINDINGS: The heart size and mediastinal contours are within normal limits. Aortic atherosclerosis. No focal airspace consolidation, pleural effusion, or pneumothorax. The visualized skeletal structures are unremarkable. IMPRESSION: No active disease. Electronically Signed   By: Davina Poke D.O.   On: 02/20/2021 11:08   CT CORONARY FRACTIONAL FLOW RESERVE DATA PREP  Result Date: 02/21/2021 EXAM: CT FFR ANALYSIS CLINICAL DATA:  Chest pain, nonspecific FINDINGS: FFRct analysis was performed on the original cardiac CT angiogram dataset. Diagrammatic representation of the FFRct analysis is provided in a separate PDF document in PACS. This dictation was created using the PDF document and an interactive 3D model of the results. 3D model is not available in the EMR/PACS. Normal FFR range is >0.80. 1. Left Main:  No significant stenosis. FFR = 0.97 2. LAD: No significant stenosis. Proximal FFR = 0.95, Mid FFR = 0.93, Distal FFR = 0.83 3. LCX: No significant stenosis. Proximal FFR = 0.92, Distal FFR = 0.86 4. RCA: No significant stenosis. Proximal FFR = 0.96, Mid FFR = 0.89, Distal FFR = 0.87 IMPRESSION: 1.  CT FFR analysis did not show any significant stenosis. Electronically Signed   By: Buford Dresser M.D.   On: 02/21/2021 14:30   ECHOCARDIOGRAM COMPLETE  Result Date: 02/20/2021    ECHOCARDIOGRAM REPORT   Patient Name:   Anne [name] Anne [name] Date of Exam: 02/20/2021 Medical Rec #:  [id number]           Height:       67.0 in Accession #:    [id number]          Weight:       158.0 lb Date of Birth:  09/07/1930           BSA:          1.829 m Patient Age:    87 years            BP:           154/79 mmHg Patient Gender: F                    HR:           68 bpm. Exam Location:  Inpatient Procedure: 2D Echo,  anti-ischemic pharmacotherapy as well as risk factor modification per guideline directed care. Invasive coronary angiography recommended with revascularization per published guideline statements. 6. CAD-RADS 5: Total coronary occlusion (100%). Consider cardiac catheterization or viability assessment. Consider symptom-guided anti-ischemic pharmacotherapy as well as risk factor modification per guideline directed care. 7. CAD-RADS N: Non-diagnostic study. Obstructive CAD can't be excluded. Alternative evaluation is recommended. Electronically Signed   By: Buford Dresser M.D.   On: 02/21/2021 11:40   Result Date: 02/21/2021 EXAM: OVER-READ INTERPRETATION  CT CHEST The following report is an over-read performed by radiologist Dr. Rolm Baptise of [facility], [place] on 02/21/2021. This over-read does not include interpretation of cardiac or coronary anatomy or pathology. The coronary CTA interpretation by the cardiologist is attached. COMPARISON:  11/19/2014 FINDINGS: Vascular: Heart is normal size. Aorta normal caliber. Scattered aortic calcifications. Mediastinum/Nodes: No adenopathy Lungs/Pleura: No confluent airspace opacities or effusions. Upper Abdomen: Imaging into the upper abdomen demonstrates no acute findings. Musculoskeletal: Chest wall soft tissues are unremarkable. No acute bony abnormality.  IMPRESSION: Scattered aortic atherosclerosis. No acute extra cardiac abnormality. Electronically Signed: By: Rolm Baptise M.D. On: 02/21/2021 10:38   DG Chest Port 1 View  Result Date: 02/20/2021 CLINICAL DATA:  Chest pain EXAM: PORTABLE CHEST 1 VIEW COMPARISON:  08/17/2019 FINDINGS: The heart size and mediastinal contours are within normal limits. Aortic atherosclerosis. No focal airspace consolidation, pleural effusion, or pneumothorax. The visualized skeletal structures are unremarkable. IMPRESSION: No active disease. Electronically Signed   By: Davina Poke D.O.   On: 02/20/2021 11:08   CT CORONARY FRACTIONAL FLOW RESERVE DATA PREP  Result Date: 02/21/2021 EXAM: CT FFR ANALYSIS CLINICAL DATA:  Chest pain, nonspecific FINDINGS: FFRct analysis was performed on the original cardiac CT angiogram dataset. Diagrammatic representation of the FFRct analysis is provided in a separate PDF document in PACS. This dictation was created using the PDF document and an interactive 3D model of the results. 3D model is not available in the EMR/PACS. Normal FFR range is >0.80. 1. Left Main:  No significant stenosis. FFR = 0.97 2. LAD: No significant stenosis. Proximal FFR = 0.95, Mid FFR = 0.93, Distal FFR = 0.83 3. LCX: No significant stenosis. Proximal FFR = 0.92, Distal FFR = 0.86 4. RCA: No significant stenosis. Proximal FFR = 0.96, Mid FFR = 0.89, Distal FFR = 0.87 IMPRESSION: 1.  CT FFR analysis did not show any significant stenosis. Electronically Signed   By: Buford Dresser M.D.   On: 02/21/2021 14:30   ECHOCARDIOGRAM COMPLETE  Result Date: 02/20/2021    ECHOCARDIOGRAM REPORT   Patient Name:   Anne [name] Anne [name] Date of Exam: 02/20/2021 Medical Rec #:  [id number]           Height:       67.0 in Accession #:    [id number]          Weight:       158.0 lb Date of Birth:  09/07/1930           BSA:          1.829 m Patient Age:    87 years            BP:           154/79 mmHg Patient Gender: F                    HR:           68 bpm. Exam Location:  Inpatient Procedure: 2D Echo,

## 2021-03-09 NOTE — Telephone Encounter (Signed)
LMVM available this afternoon or tomorrow for patient to come in and have monitor applied.  Please call Nastasia Kage in monitors at [phone] to schedule.

## 2021-03-09 NOTE — Telephone Encounter (Signed)
Patient wanted to know if she could come into the office to have her heart monitor put on. She is worried she won't do it correctly

## 2021-03-09 NOTE — Assessment & Plan Note (Signed)
Mild flare - short steroid challenge. Recent CXR and CT chest with no pulmonary acute issues.  ? GERD component -begin PPI   Plan  Patient Instructions  Continue on BREO 1 puff daily , rinse after use.  Albuterol inhaler As needed   Begin Prilosec 20mg  daily . (Over the counter)  Liquid Mucinex Twice daily  As needed  cough/congestion  Prednisone 20mg  daily for 3 days -take with food  Follow with Dr. Chase Caller or Boykin Baetz NP in 6 weeks and As needed   Please contact office for sooner follow up if symptoms do not improve or worsen or seek emergency care

## 2021-03-09 NOTE — Telephone Encounter (Signed)
Patient scheduled 03/09/21, 4:30 PM to have monitor applied.

## 2021-03-09 NOTE — Telephone Encounter (Signed)
Follow Up:    Patient is retuning your call.

## 2021-03-14 NOTE — Telephone Encounter (Signed)
Patient is following up. She would like to know if she needs to keep appointment scheduled for 12/21 with Dr. Tamala Julian or if she should wait until monitoring cycle is complete. Please advise.

## 2021-03-14 NOTE — Telephone Encounter (Signed)
Spoke with pt and made her aware to keep appt.  Pt agreeable.

## 2021-03-21 NOTE — Progress Notes (Signed)
Cardiology Office Note:    Date:  03/22/2021   ID:  Anne [name], DOB 1930-07-19, MRN [id number]  PCP:  Merlene Laughter, MD  Cardiologist:  Lesleigh Noe, MD   Referring MD: Merlene Laughter, MD   No chief complaint on file.   History of Present Illness:    Anne [name] is a 87 y.o. female with a hx of anxiety disorder, hypertension, recurring non-cardiac chest pain, and history of asthma.   Having some difficulty with memory.  Was having palpitations.  This led to the recommendation to wear a monitor for a month.  She states that the palpitations have completely resolved.  2 weeks of information have not demonstrated any significant arrhythmia.  A blood pressure log demonstrates evening blood pressures at times greater than 165 mmHg.  She currently takes 12.5 mg of metoprolol in the morning and in the evening.  She also takes amlodipine 5 mg each morning.  Past Medical History:  Diagnosis Date   Allergic rhinitis    Arthritis    Asthma    Atypical chest pain    Collapse of right lung    Dyslipidemia    Gastroesophageal reflux disease    Glaucoma    Hematuria    Hyperlipidemia    Lung nodules    PVC's (premature ventricular contractions) 02/21/2021   Sinus bradycardia 02/21/2021    Past Surgical History:  Procedure Laterality Date   ANAL FISSURE REPAIR     BREAST BIOPSY Right    CATARACT EXTRACTION Bilateral    MASS EXCISION  04/11/2012   Procedure: EXCISION MASS;  Surgeon: Nicki Reaper, MD;  Location: [facility];  Service: Orthopedics;  Laterality: Right;  Excision of mass right upper arm   MEDIASTINOSCOPY  4/11   NASAL SINUS SURGERY     TOTAL ABDOMINAL HYSTERECTOMY W/ BILATERAL SALPINGOOPHORECTOMY      Current Medications: Current Meds  Medication Sig   albuterol (PROAIR HFA) 108 (90 Base) MCG/ACT inhaler INHALE 1-2 PUFFS INTO THE LUNGS EVERY 6 (SIX) HOURS AS NEEDED FOR WHEEZING OR SHORTNESS OF BREATH.   amLODipine (NORVASC) 5  MG tablet Take 1 tablet by mouth daily.   aspirin EC 81 MG EC tablet Take 1 tablet (81 mg total) by mouth daily. Swallow whole.   BREO ELLIPTA 100-25 MCG/INH AEPB INHALE 1 PUFF BY MOUTH EVERY DAY   Calcium Carb-Cholecalciferol 600-200 MG-UNIT TABS Take 1 tablet by mouth daily.   cetirizine (ZYRTEC) 10 MG tablet Take 10 mg by mouth every morning.   ferrous sulfate 325 (65 FE) MG tablet Take 325 mg by mouth 3 (three) times a week. Take on MWF   fluticasone (FLONASE) 50 MCG/ACT nasal spray Place 1 spray into both nostrils 2 (two) times daily as needed for allergies.    gabapentin (NEURONTIN) 300 MG capsule Take 1 capsule (300 mg total) by mouth daily. (Patient taking differently: Take 300 mg by mouth at bedtime.)   latanoprost (XALATAN) 0.005 % ophthalmic solution Place 1 drop into both eyes at bedtime.   metoprolol succinate (TOPROL XL) 25 MG 24 hr tablet Take 1 tablet (25 mg total) by mouth daily.   Multiple Vitamin (MULTIVITAMIN) tablet Take 1 tablet by mouth every morning.    nitroGLYCERIN (NITROSTAT) 0.4 MG SL tablet Place 1 tablet (0.4 mg total) under the tongue every 5 (five) minutes as needed for chest pain (Up to 3 doses in 15 minutes.).   predniSONE (DELTASONE) 20 MG tablet Take 1 tablet (20 mg  total) by mouth daily with breakfast.   rosuvastatin (CRESTOR) 20 MG tablet Take 1 tablet (20 mg total) by mouth daily.   TURMERIC PO Take 1 capsule by mouth daily.   [DISCONTINUED] metoprolol tartrate (LOPRESSOR) 25 MG tablet Take 0.5 tablets (12.5 mg total) by mouth 2 (two) times daily.     Allergies:   Other, Shellfish allergy, Latex, Pravastatin, and Adhesive [tape]   Social History   Socioeconomic History   Marital status: Married    Spouse name: Not on file   Number of children: Not on file   Years of education: Not on file   Highest education level: Not on file  Occupational History   Occupation: retired  Tobacco Use   Smoking status: Former    Years: 4.00    Types: Cigarettes     Start date: 03/10/52    Quit date: 1960    Years since quitting: 63.0   Smokeless tobacco: Never   Tobacco comments:    social smoker  Building services engineer Use: Never used  Substance and Sexual Activity   Alcohol use: No    Alcohol/week: 0.0 standard drinks   Drug use: No   Sexual activity: Never    Birth control/protection: Surgical  Other Topics Concern   Not on file  Social History Narrative   Retired Public relations account executive.  Lives alone, husband passed away in 03-10-2017.  No children.   Right handed   One level home   Social Determinants of Health   Financial Resource Strain: Not on file  Food Insecurity: Not on file  Transportation Needs: Not on file  Physical Activity: Not on file  Stress: Not on file  Social Connections: Not on file     Family History: The patient's family history includes Breast cancer in her cousin; Cancer in her mother; Heart disease in her father. There is no history of Heart attack.  ROS:   Please see the history of present illness.    We discussed why she needed to wear the monitor.  We discussed the fleeting chest discomfort that occurs when she does stretching in the morning.  It sounds like costochondritis/musculoskeletal discomfort.  All other systems reviewed and are negative.  EKGs/Labs/Other Studies Reviewed:    The following studies were reviewed today:  CORONARY CTA 02/21/2021 IMPRESSION: 1. Moderate CAD, CADRADS = 3. CT FFR will be performed and reported separately.   2. Coronary calcium score of 615. She is out of the age range for percentile comparison   3. Normal coronary origin with right dominance.   4.  Aortic atherosclerosis.   INTERPRETATION:   1. CAD-RADS 0: No evidence of CAD (0%). Consider non-atherosclerotic causes of chest pain.   2. CAD-RADS 1: Minimal non-obstructive CAD (0-24%). Consider non-atherosclerotic causes of chest pain. Consider preventive therapy and risk factor modification.   3. CAD-RADS 2: Mild  non-obstructive CAD (25-49%). Consider non-atherosclerotic causes of chest pain. Consider preventive therapy and risk factor modification.   4. CAD-RADS 3: Moderate stenosis (50-69%). Consider symptom-guided anti-ischemic pharmacotherapy as well as risk factor modification per guideline directed care. Additional analysis with CT FFR will be submitted.  CT FFR 03-10-21: No modeled FFR < 0.80 in coronary tree    ECHOCARDIOGRAM 03/10/2021 IMPRESSIONS     1. Left ventricular ejection fraction, by estimation, is 60 to 65%. The  left ventricle has normal function. The left ventricle has no regional  wall motion abnormalities. Left ventricular diastolic parameters are  consistent with Grade I diastolic  dysfunction (impaired relaxation).   2. Right ventricular systolic function is normal. The right ventricular  size is normal. There is normal pulmonary artery systolic pressure.   3. The mitral valve is grossly normal. Trivial mitral valve  regurgitation.   4. The aortic valve is tricuspid. Aortic valve regurgitation is not  visualized. Aortic valve sclerosis is present, with no evidence of aortic  valve stenosis.   5. The inferior vena cava is normal in size with greater than 50%  respiratory variability, suggesting right atrial pressure of 3 mmHg.   Comparison(s): No prior Echocardiogram.   30-day coronary monitor initiated for palpitations on 03/09/2021: Initial tracing reveals sinus rhythm/sinus bradycardia without ectopy  EKG:  EKG not repeated  Recent Labs: 02/20/2021: Magnesium 2.0; TSH 2.370 02/21/2021: ALT 19; BUN 15; Creatinine, Ser 0.99; Hemoglobin 13.1; Platelets 318; Potassium 4.1; Sodium 138  Recent Lipid Panel No results found for: CHOL, TRIG, HDL, CHOLHDL, VLDL, LDLCALC, LDLDIRECT  Physical Exam:    VS:  BP (!) 138/58    Pulse 61    Ht 5\' 7"  (1.702 m)    Wt 160 lb 3.2 oz (72.7 kg)    SpO2 98%    BMI 25.09 kg/m     Wt Readings from Last 3 Encounters:  03/22/21 160  lb 3.2 oz (72.7 kg)  03/09/21 166 lb 12.8 oz (75.7 kg)  02/20/21 158 lb (71.7 kg)     GEN: Healthy appearing younger than stated age. No acute distress HEENT: Normal NECK: No JVD. LYMPHATICS: No lymphadenopathy CARDIAC: No murmur. RRR no gallop, or edema. VASCULAR:  Normal Pulses. No bruits. RESPIRATORY:  Clear to auscultation without rales, wheezing or rhonchi  ABDOMEN: Soft, non-tender, non-distended, No pulsatile mass, MUSCULOSKELETAL: No deformity  SKIN: Warm and dry NEUROLOGIC:  Alert and oriented x 3 PSYCHIATRIC:  Normal affect   ASSESSMENT:    1. Precordial pain   2. Palpitations   3. Abnormal findings diagnostic imaging of heart and coronary circulation   4. Systolic hypertension    PLAN:    In order of problems listed above:  Musculoskeletal/costochondritis most likely. No arrhythmia noted after 2 weeks of monitoring.  It is probably okay for her to discontinue the monitor. She does have a high coronary calcium score and therefore management of risk factors is important including blood pressure and cholesterol. Target 160/80 mmHg or less.  We will change the dosing schedule of her therapy to include metoprolol succinate 25 mg once per day in the morning and amlodipine 5 mg between 4 and 6:00 each day.  Continue to monitor blood pressure.  Cut back on salt in her diet.  Overall education and awareness concerning primary risk prevention was discussed in detail: LDL less than 70, hemoglobin A1c less than 7, blood pressure target less than 130/80 mmHg, >150 minutes of moderate aerobic activity per week, avoidance of smoking, weight control (via diet and exercise), and continued surveillance/management of/for obstructive sleep apnea.    Medication Adjustments/Labs and Tests Ordered: Current medicines are reviewed at length with the patient today.  Concerns regarding medicines are outlined above.  No orders of the defined types were placed in this encounter.  Meds ordered  this encounter  Medications   metoprolol succinate (TOPROL XL) 25 MG 24 hr tablet    Sig: Take 1 tablet (25 mg total) by mouth daily.    Dispense:  90 tablet    Refill:  3    Patient Instructions  Medication Instructions:  Your physician has recommended you  make the following change in your medication:   STOP Lopressor  START Toprol XL 25 taking 1 tablet every morning  START taking Amlodipine between 2-6 pm  *If you need a refill on your cardiac medications before your next appointment, please call your pharmacy*   Lab Work: None ordered  If you have labs (blood work) drawn today and your tests are completely normal, you will receive your results only by: MyChart Message (if you have MyChart) OR A paper copy in the mail If you have any lab test that is abnormal or we need to change your treatment, we will call you to review the results.   Testing/Procedures: None ordered   Follow-Up: At [facility], you and your health needs are our priority.  As part of our continuing mission to provide you with exceptional heart care, we have created designated Provider Care Teams.  These Care Teams include your primary Cardiologist (physician) and Advanced Practice Providers (APPs -  Physician Assistants and Nurse Practitioners) who all work together to provide you with the care you need, when you need it.  We recommend signing up for the patient portal called "MyChart".  Sign up information is provided on this After Visit Summary.  MyChart is used to connect with patients for Virtual Visits (Telemedicine).  Patients are able to view lab/test results, encounter notes, upcoming appointments, etc.  Non-urgent messages can be sent to your provider as well.   To learn more about what you can do with MyChart, go to [email or web address].    Your next appointment:   AS NEEDED  The format for your next appointment:     Provider:   Lesleigh Noe, MD     Other Instructions      Signed, Lesleigh Noe, MD  03/22/2021 12:05 PM    [facility]

## 2021-03-22 ENCOUNTER — Ambulatory Visit: Payer: Medicare PPO | Admitting: Interventional Cardiology

## 2021-03-22 ENCOUNTER — Other Ambulatory Visit: Payer: Self-pay

## 2021-03-22 ENCOUNTER — Encounter: Payer: Self-pay | Admitting: Interventional Cardiology

## 2021-03-22 VITALS — BP 138/58 | HR 61 | Ht 67.0 in | Wt 160.2 lb

## 2021-03-22 DIAGNOSIS — I10 Essential (primary) hypertension: Secondary | ICD-10-CM | POA: Diagnosis not present

## 2021-03-22 DIAGNOSIS — R931 Abnormal findings on diagnostic imaging of heart and coronary circulation: Secondary | ICD-10-CM

## 2021-03-22 DIAGNOSIS — R002 Palpitations: Secondary | ICD-10-CM | POA: Diagnosis not present

## 2021-03-22 DIAGNOSIS — R072 Precordial pain: Secondary | ICD-10-CM | POA: Diagnosis not present

## 2021-03-22 MED ORDER — METOPROLOL SUCCINATE ER 25 MG PO TB24: 25.0000 mg | ORAL_TABLET | Freq: Every day | ORAL | 3 refills | Status: DC

## 2021-03-22 NOTE — Patient Instructions (Signed)
Medication Instructions:  Your physician has recommended you make the following change in your medication:   STOP Lopressor  START Toprol XL 25 taking 1 tablet every morning  START taking Amlodipine between 2-6 pm  *If you need a refill on your cardiac medications before your next appointment, please call your pharmacy*   Lab Work: None ordered  If you have labs (blood work) drawn today and your tests are completely normal, you will receive your results only by: [place] (if you have MyChart) OR A paper copy in the mail If you have any lab test that is abnormal or we need to change your treatment, we will call you to review the results.   Testing/Procedures: None ordered   Follow-Up: At [facility], you and your health needs are our priority.  As part of our continuing mission to provide you with exceptional heart care, we have created designated Provider Care Teams.  These Care Teams include your primary Cardiologist (physician) and Advanced Practice Providers (APPs -  Physician Assistants and Nurse Practitioners) who all work together to provide you with the care you need, when you need it.  We recommend signing up for the patient portal called "MyChart".  Sign up information is provided on this After Visit Summary.  MyChart is used to connect with patients for Virtual Visits (Telemedicine).  Patients are able to view lab/test results, encounter notes, upcoming appointments, etc.  Non-urgent messages can be sent to your provider as well.   To learn more about what you can do with MyChart, go to [email or web address].    Your next appointment:   AS NEEDED  The format for your next appointment:     Provider:   Sinclair Grooms, MD     Other Instructions

## 2021-04-20 ENCOUNTER — Ambulatory Visit: Payer: Medicare PPO | Admitting: Internal Medicine

## 2021-05-02 DIAGNOSIS — I10 Essential (primary) hypertension: Secondary | ICD-10-CM | POA: Diagnosis not present

## 2021-05-02 DIAGNOSIS — H409 Unspecified glaucoma: Secondary | ICD-10-CM | POA: Diagnosis not present

## 2021-05-02 DIAGNOSIS — D649 Anemia, unspecified: Secondary | ICD-10-CM | POA: Diagnosis not present

## 2021-05-02 DIAGNOSIS — E785 Hyperlipidemia, unspecified: Secondary | ICD-10-CM | POA: Diagnosis not present

## 2021-05-02 DIAGNOSIS — G629 Polyneuropathy, unspecified: Secondary | ICD-10-CM | POA: Diagnosis not present

## 2021-05-02 DIAGNOSIS — G8929 Other chronic pain: Secondary | ICD-10-CM | POA: Diagnosis not present

## 2021-05-02 DIAGNOSIS — J301 Allergic rhinitis due to pollen: Secondary | ICD-10-CM | POA: Diagnosis not present

## 2021-05-02 DIAGNOSIS — J449 Chronic obstructive pulmonary disease, unspecified: Secondary | ICD-10-CM | POA: Diagnosis not present

## 2021-05-02 DIAGNOSIS — K219 Gastro-esophageal reflux disease without esophagitis: Secondary | ICD-10-CM | POA: Diagnosis not present

## 2021-05-06 ENCOUNTER — Emergency Department (HOSPITAL_COMMUNITY)
Admission: EM | Admit: 2021-05-06 | Discharge: 2021-05-06 | Disposition: A | Discharge status: Home / Self Care | Payer: Medicare PPO | Attending: Emergency Medicine | Admitting: Emergency Medicine

## 2021-05-06 ENCOUNTER — Other Ambulatory Visit: Payer: Self-pay

## 2021-05-06 ENCOUNTER — Emergency Department (HOSPITAL_COMMUNITY): Payer: Medicare PPO

## 2021-05-06 ENCOUNTER — Encounter (HOSPITAL_COMMUNITY): Payer: Self-pay

## 2021-05-06 ENCOUNTER — Encounter (HOSPITAL_COMMUNITY): Payer: Self-pay | Admitting: Emergency Medicine

## 2021-05-06 ENCOUNTER — Emergency Department (HOSPITAL_BASED_OUTPATIENT_CLINIC_OR_DEPARTMENT_OTHER): Payer: Medicare PPO

## 2021-05-06 ENCOUNTER — Ambulatory Visit (HOSPITAL_COMMUNITY)
Admission: EM | Admit: 2021-05-06 | Discharge: 2021-05-06 | Disposition: A | Discharge status: Other Acute Inpatient Hospital | Payer: Medicare PPO

## 2021-05-06 DIAGNOSIS — R0602 Shortness of breath: Secondary | ICD-10-CM

## 2021-05-06 DIAGNOSIS — Z7982 Long term (current) use of aspirin: Secondary | ICD-10-CM | POA: Diagnosis not present

## 2021-05-06 DIAGNOSIS — Z20822 Contact with and (suspected) exposure to covid-19: Secondary | ICD-10-CM | POA: Insufficient documentation

## 2021-05-06 DIAGNOSIS — J45909 Unspecified asthma, uncomplicated: Secondary | ICD-10-CM | POA: Insufficient documentation

## 2021-05-06 DIAGNOSIS — R0789 Other chest pain: Secondary | ICD-10-CM | POA: Diagnosis not present

## 2021-05-06 DIAGNOSIS — R001 Bradycardia, unspecified: Secondary | ICD-10-CM | POA: Diagnosis not present

## 2021-05-06 DIAGNOSIS — I10 Essential (primary) hypertension: Secondary | ICD-10-CM | POA: Diagnosis not present

## 2021-05-06 DIAGNOSIS — J449 Chronic obstructive pulmonary disease, unspecified: Secondary | ICD-10-CM | POA: Diagnosis not present

## 2021-05-06 DIAGNOSIS — J9811 Atelectasis: Secondary | ICD-10-CM | POA: Diagnosis not present

## 2021-05-06 DIAGNOSIS — Z91040 Latex allergy status: Secondary | ICD-10-CM | POA: Insufficient documentation

## 2021-05-06 DIAGNOSIS — R600 Localized edema: Secondary | ICD-10-CM | POA: Diagnosis not present

## 2021-05-06 DIAGNOSIS — Z7951 Long term (current) use of inhaled steroids: Secondary | ICD-10-CM | POA: Diagnosis not present

## 2021-05-06 DIAGNOSIS — R911 Solitary pulmonary nodule: Secondary | ICD-10-CM | POA: Diagnosis not present

## 2021-05-06 DIAGNOSIS — Z79899 Other long term (current) drug therapy: Secondary | ICD-10-CM | POA: Insufficient documentation

## 2021-05-06 DIAGNOSIS — R609 Edema, unspecified: Secondary | ICD-10-CM | POA: Diagnosis not present

## 2021-05-06 LAB — COMPREHENSIVE METABOLIC PANEL
ALT: 23 U/L (ref 0–44)
AST: 29 U/L (ref 15–41)
Albumin: 4.1 g/dL (ref 3.5–5.0)
Alkaline Phosphatase: 60 U/L (ref 38–126)
Anion gap: 9 (ref 5–15)
BUN: 25 mg/dL — ABNORMAL HIGH (ref 8–23)
CO2: 26 mmol/L (ref 22–32)
Calcium: 9.7 mg/dL (ref 8.9–10.3)
Chloride: 105 mmol/L (ref 98–111)
Creatinine, Ser: 1.05 mg/dL — ABNORMAL HIGH (ref 0.44–1.00)
GFR, Estimated: 51 mL/min — ABNORMAL LOW (ref >60)
Glucose, Bld: 99 mg/dL (ref 70–99)
Potassium: 3.8 mmol/L (ref 3.5–5.1)
Sodium: 140 mmol/L (ref 135–145)
Total Bilirubin: 0.5 mg/dL (ref 0.3–1.2)
Total Protein: 7.6 g/dL (ref 6.5–8.1)

## 2021-05-06 LAB — CBC WITH DIFFERENTIAL/PLATELET
Abs Immature Granulocytes: 0.02 10*3/uL (ref 0.00–0.07)
Basophils Absolute: 0 10*3/uL (ref 0.0–0.1)
Basophils Relative: 1 %
Eosinophils Absolute: 0.2 10*3/uL (ref 0.0–0.5)
Eosinophils Relative: 3 %
HCT: 40.1 % (ref 36.0–46.0)
Hemoglobin: 12.4 g/dL (ref 12.0–15.0)
Immature Granulocytes: 0 %
Lymphocytes Relative: 37 %
Lymphs Abs: 2.2 10*3/uL (ref 0.7–4.0)
MCH: 30.3 pg (ref 26.0–34.0)
MCHC: 30.9 g/dL (ref 30.0–36.0)
MCV: 98 fL (ref 80.0–100.0)
Monocytes Absolute: 0.6 10*3/uL (ref 0.1–1.0)
Monocytes Relative: 11 %
Neutro Abs: 3 10*3/uL (ref 1.7–7.7)
Neutrophils Relative %: 48 %
Platelets: 250 10*3/uL (ref 150–400)
RBC: 4.09 MIL/uL (ref 3.87–5.11)
RDW: 13.1 % (ref 11.5–15.5)
WBC: 6.1 10*3/uL (ref 4.0–10.5)
nRBC: 0 % (ref 0.0–0.2)

## 2021-05-06 LAB — RESP PANEL BY RT-PCR (FLU A&B, COVID) ARPGX2
Influenza A by PCR: NEGATIVE
Influenza B by PCR: NEGATIVE
SARS Coronavirus 2 by RT PCR: NEGATIVE

## 2021-05-06 LAB — TROPONIN I (HIGH SENSITIVITY)
Troponin I (High Sensitivity): 7 ng/L (ref <18)
Troponin I (High Sensitivity): 8 ng/L (ref <18)

## 2021-05-06 LAB — BRAIN NATRIURETIC PEPTIDE: B Natriuretic Peptide: 240.6 pg/mL — ABNORMAL HIGH (ref 0.0–100.0)

## 2021-05-06 LAB — LIPASE, BLOOD: Lipase: 37 U/L (ref 11–51)

## 2021-05-06 LAB — PROTIME-INR
INR: 1 (ref 0.8–1.2)
Prothrombin Time: 13 seconds (ref 11.4–15.2)

## 2021-05-06 MED ORDER — IOHEXOL 350 MG/ML SOLN
63.0000 mL | Freq: Once | INTRAVENOUS | Status: AC | PRN
  Administered 2021-05-06: 63 mL via INTRAVENOUS

## 2021-05-06 NOTE — ED Triage Notes (Signed)
Pt presents with c/o shortness of breath. Pt states she woke up with SOB and feeling heavy in the chest. States the pain feels like pressure.

## 2021-05-06 NOTE — ED Notes (Signed)
Patient transported to Ultrasound 

## 2021-05-06 NOTE — ED Provider Notes (Signed)
Care of the patient assumed at the change of shift. Patient with history of COPD among other chronic problems here with SOB and R sided chest pain. She was also seen at [facility] and sent for bradycardia/bigeminy. Awaiting labs and imaging.  Physical Exam  BP 137/62    Pulse 60    Temp 98.2 F (36.8 C) (Oral)    Resp 13    SpO2 100%   Physical Exam  Procedures  Procedures  ED Course / MDM   Clinical Course as of 05/06/21 2056  Sat May 06, 2021  1726 DVT study is neg, prelim. Covid/Flu neg. Patient's labs have been marked as collected but not run in lab. RN to check on status.  [CS]  1800 CBC is normal.  [CS]  1800 INR is normal.  [CS]  6811 CMP, lipase and initial Trop are neg.  [CS]  5726 Repeat Trop is neg.  [CS]  1917 BNP only mildly elevated [CS]  2055 CTA is neg for acute process. Patient is standing at bedside, asymptomatic and asking for discharge. Has outpatient follow up.  [CS]    Clinical Course User Index [CS] Truddie Hidden, MD   Medical Decision Making Problems Addressed: Chest discomfort: acute illness or injury that poses a threat to life or bodily functions Shortness of breath: acute illness or injury that poses a threat to life or bodily functions  Amount and/or Complexity of Data Reviewed Labs: ordered. Decision-making details documented in ED Course. Radiology: ordered and independent interpretation performed. Decision-making details documented in ED Course. ECG/medicine tests: ordered.  Risk Prescription drug management. Decision regarding hospitalization.          Truddie Hidden, MD 05/06/21 2056

## 2021-05-06 NOTE — ED Notes (Signed)
Called and spoke with [facility] ED Charge RN to make aware patient coming via [place].

## 2021-05-06 NOTE — ED Provider Notes (Signed)
[facility] EMERGENCY DEPARTMENT Provider Note   CSN: [id number] Arrival date & time: 05/06/21  1337     History  Chief Complaint  Patient presents with   Shortness of Breath    Anne [name] is a 88 y.o. female.  The history is provided by the patient and medical records. No language interpreter was used.  Shortness of Breath Severity:  Severe Onset quality:  Sudden Duration:  1 day Timing:  Constant Progression:  Improving Chronicity:  New Relieved by:  Nothing Worsened by:  Exertion Ineffective treatments:  Inhaler Associated symptoms: chest pain (discomfort) and wheezing (resolved now)   Associated symptoms: no abdominal pain, no cough, no diaphoresis, no fever, no headaches, no sputum production and no vomiting   Risk factors: no hx of PE/DVT       Home Medications Prior to Admission medications   Medication Sig Start Date End Date Taking? Authorizing Provider  albuterol (PROAIR HFA) 108 (90 Base) MCG/ACT inhaler INHALE 1-2 PUFFS INTO THE LUNGS EVERY 6 (SIX) HOURS AS NEEDED FOR WHEEZING OR SHORTNESS OF BREATH. 03/02/21   Brand Males, MD  amLODipine (NORVASC) 5 MG tablet Take 1 tablet by mouth daily.    [provider]  aspirin EC 81 MG EC tablet Take 1 tablet (81 mg total) by mouth daily. Swallow whole. 02/21/21   Isaiah Serge, NP  BREO ELLIPTA 100-25 MCG/INH AEPB INHALE 1 PUFF BY MOUTH EVERY DAY 01/16/21   Brand Males, MD  Calcium Carb-Cholecalciferol 600-200 MG-UNIT TABS Take 1 tablet by mouth daily.    [provider]  cetirizine (ZYRTEC) 10 MG tablet Take 10 mg by mouth every morning.    [provider]  ferrous sulfate 325 (65 FE) MG tablet Take 325 mg by mouth 3 (three) times a week. Take on MWF    [provider]  fluticasone (FLONASE) 50 MCG/ACT nasal spray Place 1 spray into both nostrils 2 (two) times daily as needed for allergies.     [provider]  gabapentin (NEURONTIN)  300 MG capsule Take 1 capsule (300 mg total) by mouth daily. Patient taking differently: Take 300 mg by mouth at bedtime. 12/08/20   Patel, Arvin Collard K, DO  latanoprost (XALATAN) 0.005 % ophthalmic solution Place 1 drop into both eyes at bedtime.    [provider]  metoprolol succinate (TOPROL XL) 25 MG 24 hr tablet Take 1 tablet (25 mg total) by mouth daily. 03/22/21   Belva Crome, MD  Multiple Vitamin (MULTIVITAMIN) tablet Take 1 tablet by mouth every morning.     [provider]  nitroGLYCERIN (NITROSTAT) 0.4 MG SL tablet Place 1 tablet (0.4 mg total) under the tongue every 5 (five) minutes as needed for chest pain (Up to 3 doses in 15 minutes.). 02/21/21   Isaiah Serge, NP  predniSONE (DELTASONE) 20 MG tablet Take 1 tablet (20 mg total) by mouth daily with breakfast. 03/09/21   Parrett, Fonnie Mu, NP  rosuvastatin (CRESTOR) 20 MG tablet Take 1 tablet (20 mg total) by mouth daily. 02/22/21   Isaiah Serge, NP  TURMERIC PO Take 1 capsule by mouth daily.    [provider]      Allergies    Other, Shellfish allergy, Latex, Pravastatin, and Adhesive [tape]    Review of Systems   Review of Systems  Constitutional:  Negative for chills, diaphoresis, fatigue and fever.  HENT:  Negative for congestion.   Eyes:  Negative for visual disturbance.  Respiratory:  Positive for chest tightness, shortness of breath and wheezing (resolved now). Negative for cough, sputum production and choking.   Cardiovascular:  Positive for chest pain (discomfort) and leg swelling. Negative for palpitations.  Gastrointestinal:  Negative for abdominal pain, constipation, diarrhea, nausea and vomiting.  Genitourinary:  Negative for dysuria.  Musculoskeletal:  Negative for back pain.  Neurological:  Negative for headaches.  Psychiatric/Behavioral:  Negative for agitation.   All other systems reviewed and are negative.  Physical Exam Updated Vital Signs There were no vitals taken for this  visit. Physical Exam Vitals and nursing note reviewed.  Constitutional:      General: She is not in acute distress.    Appearance: She is well-developed. She is not ill-appearing, toxic-appearing or diaphoretic.  HENT:     Head: Normocephalic and atraumatic.     Mouth/Throat:     Mouth: Mucous membranes are moist.  Eyes:     Conjunctiva/sclera: Conjunctivae normal.     Pupils: Pupils are equal, round, and reactive to light.  Cardiovascular:     Rate and Rhythm: Normal rate and regular rhythm.     Heart sounds: No murmur heard. Pulmonary:     Effort: Pulmonary effort is normal. No tachypnea or respiratory distress.     Breath sounds: Normal breath sounds. No decreased breath sounds, wheezing, rhonchi or rales.  Chest:     Chest wall: No tenderness.  Abdominal:     Palpations: Abdomen is soft.     Tenderness: There is no abdominal tenderness.  Musculoskeletal:        General: No swelling.     Cervical back: Neck supple.     Right lower leg: No tenderness. Edema (subtle) present.     Left lower leg: No tenderness. No edema.  Skin:    General: Skin is warm and dry.     Capillary Refill: Capillary refill takes less than 2 seconds.     Findings: No erythema.  Neurological:     General: No focal deficit present.     Mental Status: She is alert.  Psychiatric:        Mood and Affect: Mood normal.    ED Results / Procedures / Treatments   Labs (all labs ordered are listed, but only abnormal results are displayed) Labs Reviewed  RESP PANEL BY RT-PCR (FLU A&B, COVID) ARPGX2  CBC WITH DIFFERENTIAL/PLATELET  COMPREHENSIVE METABOLIC PANEL  LIPASE, BLOOD  BRAIN NATRIURETIC PEPTIDE  PROTIME-INR  TROPONIN I (HIGH SENSITIVITY)    EKG EKG Interpretation  Date/Time:  Saturday May 06 2021 13:40:15 EST Ventricular Rate:  62 PR Interval:  233 QRS Duration: 71 QT Interval:  384 QTC Calculation: 390 R Axis:   71 Text Interpretation: Sinus rhythm Prolonged PR interval  Anteroseptal infarct, age indeterminate When compared to prior, similar appearance. No STEMI Confirmed by Antony Blackbird [phone]) on 05/06/2021 1:47:51 PM  Radiology No results found.  Procedures Procedures    Medications Ordered in ED Medications - No data to display  ED Course/ Medical Decision Making/ A&P                           Medical Decision Making Amount and/or Complexity of Data Reviewed Labs: ordered. Radiology: ordered. ECG/medicine tests: ordered.   Anne [name] is a 88 y.o. female with a past medical history significant for GERD, COPD, asthma, previous PVCs and bradycardia, pulmonary nodule, hypertension, and documentation showing previous right-sided lung collapse who presents with 1 day  of right-sided chest pressure radiating to her right shoulder and shortness of breath.  According to patient, this morning she woke up started having discomfort and pressure in her right chest that goes to her right shoulder.  It is exertional and not specifically pleuritic.  She denies history of DVT or PE and does not remember any history of collapsed lung.  She denies any trauma.  Denies rashes to suggest shingles.  Denies fevers, chills, congestion, or cough but does report she was having some wheezing.  She took her inhaler without significant relief.  She does feel that upon arrival to the emergency department symptoms have started to improve.  She denies any new leg pain or leg swelling however does agree that her right ankle may be slightly more swollen than the left.  She denies any abdominal pain or back pain.  No other complaints reported.  On exam, lungs were clear with no significant rales or wheezing.  No chest discomfort.  Abdomen nontender.  Legs are nontender with some slight asymmetry in edema in the right greater than left.  Intact pulses.  Back and flanks nontender.  Vital signs reassuring and she is maintaining oxygen saturations on room air.  EKG showed no  STEMI.  According to documentation, patient was found to be in bigeminy with EMS and urgent care visit earlier with rate going into the 40s.  She was sent here for possible symptomatic bradycardia.  Patient appears to have a history of this although she is in sinus now.  I am more concerned about her right-sided chest discomfort with shortness of breath and the unilateral leg swelling.  Chart review also she had history of pulmonary nodules.  We will get CT PE study to rule out PE, pneumonia, pneumothorax, or other acute lung abnormality.  Will get delta troponin and basic labs.  Will get right lower extremity ultrasound to rule out DVT.  If work-up is reassuring, I suspect that symptoms may be related to COPD/asthma exacerbation in the setting of the environmental changes recently.  If work-up is reassuring and vital signs remain reassuring, anticipate discharge home.  Care transferred to oncoming team to await work-up results.         Final Clinical Impression(s) / ED Diagnoses Final diagnoses:  Chest discomfort       Clinical Impression: 1. Chest discomfort     Disposition: Care transferred to oncoming team to await work-up results.  This note was prepared with assistance of Systems analyst. Occasional wrong-word or sound-a-like substitutions may have occurred due to the inherent limitations of voice recognition software.      Ivaan Liddy, Gwenyth Allegra, MD 05/06/21 [phone]

## 2021-05-06 NOTE — Progress Notes (Signed)
VASCULAR LAB    Right lower extremity venous duplex has been performed.  See CV proc for preliminary results.  Messaged results to Dr. Sherry Ruffing via secure chat  Sharion Dove, RVT 05/06/2021, 3:38 PM

## 2021-05-06 NOTE — ED Provider Notes (Signed)
[place]    CSN: [id number] Arrival date & time: 05/06/21  1204      History   Chief Complaint Chief Complaint  Patient presents with   Shortness of Breath    HPI Anne [name] is a 88 y.o. female.   Patient presents with shortness of breath and chest pressure beginning this morning upon awakening.  Pain does not radiate.  Has not attempted treatment of symptoms. endorses that she has taken all daily medication.  Currently taking metoprolol XL 25 mg and amlodipine 5 mg.  Unsure of sensation of palpitations or tachycardia.  Was seen in the emergency department in November 2022 for similar symptoms.  Past Medical History:  Diagnosis Date   Allergic rhinitis    Arthritis    Asthma    Atypical chest pain    Collapse of right lung    Dyslipidemia    Gastroesophageal reflux disease    Glaucoma    Hematuria    Hyperlipidemia    Lung nodules    PVC's (premature ventricular contractions) 02/21/2021   Sinus bradycardia 02/21/2021    Patient Active Problem List   Diagnosis Date Noted   PVC's (premature ventricular contractions) symptomatic 02/21/2021   Sinus bradycardia 02/21/2021   Near syncope 02/20/2021   Arthritis 04/08/2018   Glaucoma 04/08/2018   Herpes simplex type 2 infection 04/08/2018   Uterine leiomyoma 04/08/2018   Non-recurrent acute suppurative otitis media of left ear with spontaneous rupture of tympanic membrane 01/10/2018   Pain 05/29/2017   Throat pain in adult 05/28/2017   Thrush of mouth and esophagus ([place]) 05/28/2017   Acute bacterial sinusitis 10/10/2016   De Quervain's syndrome (tenosynovitis) 01/25/2016   Primary osteoarthritis of first carpometacarpal joint of right hand 01/25/2016   Trigger finger of right thumb 01/25/2016   Essential hypertension 37/01/6268   Diastolic dysfunction 48/54/6270   Dysphagia 01/04/2015   Chest pressure 11/16/2014   Dyspnea 11/15/2014   COPD GOLD I 11/02/2014   GERD (gastroesophageal reflux  disease) 11/02/2014   Proptosis 05/31/2014   Glaucoma, bilateral 05/31/2014   Ptosis of both eyelids 05/31/2014   Xanthelasma of left upper eyelid 05/31/2014   Pain in limb 08/18/2012   Routine gynecological examination 07/24/2011   Environmental allergies 07/24/2011   Osteoporosis 07/24/2011   Nocturia 07/24/2011   Asthma 07/04/2007   PULMONARY COLLAPSE 07/04/2007   PULMONARY NODULE 07/04/2007    Past Surgical History:  Procedure Laterality Date   ANAL FISSURE REPAIR     BREAST BIOPSY Right    CATARACT EXTRACTION Bilateral    MASS EXCISION  04/11/2012   Procedure: EXCISION MASS;  Surgeon: Wynonia Sours, MD;  Location: [facility];  Service: Orthopedics;  Laterality: Right;  Excision of mass right upper arm   MEDIASTINOSCOPY  4/11   NASAL SINUS SURGERY     TOTAL ABDOMINAL HYSTERECTOMY W/ BILATERAL SALPINGOOPHORECTOMY      OB History   No obstetric history on file.      Home Medications    Prior to Admission medications   Medication Sig Start Date End Date Taking? Authorizing Provider  albuterol (PROAIR HFA) 108 (90 Base) MCG/ACT inhaler INHALE 1-2 PUFFS INTO THE LUNGS EVERY 6 (SIX) HOURS AS NEEDED FOR WHEEZING OR SHORTNESS OF BREATH. 03/02/21   Brand Males, MD  amLODipine (NORVASC) 5 MG tablet Take 1 tablet by mouth daily.    [provider]  aspirin EC 81 MG EC tablet Take 1 tablet (81 mg total) by mouth daily.  Swallow whole. 02/21/21   Isaiah Serge, NP  BREO ELLIPTA 100-25 MCG/INH AEPB INHALE 1 PUFF BY MOUTH EVERY DAY 01/16/21   Brand Males, MD  Calcium Carb-Cholecalciferol 600-200 MG-UNIT TABS Take 1 tablet by mouth daily.    [provider]  cetirizine (ZYRTEC) 10 MG tablet Take 10 mg by mouth every morning.    [provider]  ferrous sulfate 325 (65 FE) MG tablet Take 325 mg by mouth 3 (three) times a week. Take on MWF    [provider]  fluticasone (FLONASE) 50 MCG/ACT nasal spray Place 1 spray into both  nostrils 2 (two) times daily as needed for allergies.     [provider]  gabapentin (NEURONTIN) 300 MG capsule Take 1 capsule (300 mg total) by mouth daily. Patient taking differently: Take 300 mg by mouth at bedtime. 12/08/20   Patel, Arvin Collard K, DO  latanoprost (XALATAN) 0.005 % ophthalmic solution Place 1 drop into both eyes at bedtime.    [provider]  metoprolol succinate (TOPROL XL) 25 MG 24 hr tablet Take 1 tablet (25 mg total) by mouth daily. 03/22/21   Belva Crome, MD  Multiple Vitamin (MULTIVITAMIN) tablet Take 1 tablet by mouth every morning.     [provider]  nitroGLYCERIN (NITROSTAT) 0.4 MG SL tablet Place 1 tablet (0.4 mg total) under the tongue every 5 (five) minutes as needed for chest pain (Up to 3 doses in 15 minutes.). 02/21/21   Isaiah Serge, NP  predniSONE (DELTASONE) 20 MG tablet Take 1 tablet (20 mg total) by mouth daily with breakfast. 03/09/21   Parrett, Fonnie Mu, NP  rosuvastatin (CRESTOR) 20 MG tablet Take 1 tablet (20 mg total) by mouth daily. 02/22/21   Isaiah Serge, NP  TURMERIC PO Take 1 capsule by mouth daily.    [provider]    Family History Family History  Problem Relation Age of Onset   Cancer Mother    Heart disease Father    Breast cancer Cousin    Heart attack Neg Hx     Social History Social History   Tobacco Use   Smoking status: Former    Years: 4.00    Types: Cigarettes    Start date: 1953    Quit date: 1960    Years since quitting: 63.1   Smokeless tobacco: Never   Tobacco comments:    social smoker  Scientific laboratory technician Use: Never used  Substance Use Topics   Alcohol use: No    Alcohol/week: 0.0 standard drinks   Drug use: No     Allergies   Other, Shellfish allergy, Latex, Pravastatin, and Adhesive [tape]   Review of Systems Review of Systems  Respiratory:  Positive for shortness of breath.     Physical Exam Triage Vital Signs ED Triage Vitals  Enc Vitals Group     BP  05/06/21 1234 (!) 130/59     Pulse Rate 05/06/21 1234 75     Resp 05/06/21 1234 16     Temp 05/06/21 1234 97.9 F (36.6 C)     Temp Source 05/06/21 1234 Oral     SpO2 05/06/21 1234 100 %     Weight 05/06/21 1236 160 lb 4.4 oz (72.7 kg)     Height 05/06/21 1236 5\' 7"  (1.702 m)     Head Circumference --      Peak Flow --      Pain Score 05/06/21 1236 0  Pain Loc --      Pain Edu? --      Excl. in [place]? --    No data found.  Updated Vital Signs BP (!) 130/59 (BP Location: Left Arm)    Pulse 75    Temp 97.9 F (36.6 C) (Oral)    Resp 16    Ht 5\' 7"  (1.702 m)    Wt 160 lb 4.4 oz (72.7 kg)    SpO2 100%    BMI 25.10 kg/m   Visual Acuity Right Eye Distance:   Left Eye Distance:   Bilateral Distance:    Right Eye Near:   Left Eye Near:    Bilateral Near:     Physical Exam   UC Treatments / Results  Labs (all labs ordered are listed, but only abnormal results are displayed) Labs Reviewed - No data to display  EKG   Radiology No results found.  Procedures Procedures (including critical care time)  Medications Ordered in UC Medications - No data to display  Initial Impression / Assessment and Plan / UC Course  I have reviewed the triage vital signs and the nursing notes.  Pertinent labs & imaging results that were available during my care of the patient were reviewed by me and considered in my medical decision making (see chart for details).  Bradycardia  Notified by nursing staff heart rate of 36 on monitor during triage, confirmed with palpation of left radial site, patient endorses continued sensation of shortness of breath with chest pressure, endorses that symptoms have occurred in the past she was followed up with cardiology where she wore a monitor for 2 weeks with no abnormality noted, EKG showing normal sinus rhythm with bigeminy PVCs, due to bradycardia with associated symptoms patient sent to nearest emergency department for evaluation, patient drove self to  urgent care, taken to emergency department via [facility] Final Clinical Impressions(s) / UC Diagnoses   Final diagnoses:  None   Discharge Instructions   None    ED Prescriptions   None    PDMP not reviewed this encounter.   Hans Eden, NP 05/06/21 1349

## 2021-05-06 NOTE — ED Notes (Signed)
[facility] called for transport. 

## 2021-05-06 NOTE — ED Notes (Signed)
Patient transported to CT 

## 2021-05-06 NOTE — ED Triage Notes (Signed)
Pt BIB [facility] for shob and chest pressure that started this AM, pt has hx of such and hx of anxiety that she feels maybe a contributing factor. When [facility] arrived pt was in bigeminy but has since corrected to NSR

## 2021-05-06 NOTE — ED Notes (Signed)
Patient is being discharged from the Urgent Care and sent to the Emergency Department via [facility] . Per Lowella Petties, NP, patient is in need of higher level of care due to symptomatic bradycardia. Patient is aware and verbalizes understanding of plan of care.  Vitals:   05/06/21 1234  BP: (!) 130/59  Pulse: 75  Resp: 16  Temp: 97.9 F (36.6 C)  SpO2: 100%

## 2021-05-09 DIAGNOSIS — R0600 Dyspnea, unspecified: Secondary | ICD-10-CM | POA: Diagnosis not present

## 2021-05-09 DIAGNOSIS — I10 Essential (primary) hypertension: Secondary | ICD-10-CM | POA: Diagnosis not present

## 2021-05-09 DIAGNOSIS — R0689 Other abnormalities of breathing: Secondary | ICD-10-CM | POA: Diagnosis not present

## 2021-05-16 ENCOUNTER — Telehealth: Payer: Self-pay | Admitting: Interventional Cardiology

## 2021-05-16 ENCOUNTER — Ambulatory Visit: Payer: Medicare PPO | Admitting: Nurse Practitioner

## 2021-05-16 NOTE — Telephone Encounter (Signed)
Pt c/o medication issue:  1. Name of Medication:  amLODipine (NORVASC) 5 MG tablet aspirin EC 81 MG EC tablet metoprolol succinate (TOPROL XL) 25 MG 24 hr tablet rosuvastatin (CRESTOR) 20 MG tablet  2. How are you currently taking this medication (dosage and times per day)? As directed   3. Are you having a reaction (difficulty breathing--STAT)? No   4. What is your medication issue? Patient was put on these medications when she was in the hospital in November. She was not sure if she needed to continue taking these medications or not. It is time to refill  them

## 2021-05-16 NOTE — Telephone Encounter (Signed)
Pt instructed that needs to be on these meds indefinitely and if needs future refills to contact PCP as we are only going to see as needed ./cy

## 2021-05-30 ENCOUNTER — Telehealth: Payer: Self-pay | Admitting: Interventional Cardiology

## 2021-05-30 NOTE — Telephone Encounter (Signed)
Pt c/o Shortness Of Breath: STAT if SOB developed within the last 24 hours or pt is noticeably SOB on the phone  1. Are you currently SOB (can you hear that pt is SOB on the phone)?  No   2. How long have you been experiencing SOB?  Past few days   3. Are you SOB when sitting or when up moving around?  When up and moving around   4. Are you currently experiencing any other symptoms?  Not currently, occasional chest discomfort

## 2021-05-30 NOTE — Telephone Encounter (Signed)
Pt states she has noticed some increased SOB over the last few days.  Occurs when she's doing her normal daily exercises.  Also having increased fatigue.  Occasionally has some chest discomfort that she describes as a burning feeling that is similar to what she gets when she has episodes of acid reflux.  Pt started back on her Prilosec today.  Episodes of SOB do not last long but seem to be a little longer than they use to be.  Went to ED on 05/06/21 with same complaints but they were much worse then.  PE was ruled out at that time.  Denies swelling, dizziness, blurred vision or HA.  Denies increased salt in diet.  Advised pt I would send message to Dr. Tamala Julian for review and advisement.  Pt had echo and Coronary CT in November.

## 2021-05-31 NOTE — Telephone Encounter (Signed)
Left message to call back  

## 2021-05-31 NOTE — Telephone Encounter (Signed)
?  Transferred call to RN ?

## 2021-05-31 NOTE — Telephone Encounter (Signed)
Spoke with pt and she states she is doing much better today.  Denies chest discomfort.  Started back on Prilosec and feels this is helping.  Pt aware to call back if symptoms begin to worsen again.  Pt thankful for call.  ?

## 2021-06-02 NOTE — Telephone Encounter (Signed)
Patient calling to let us know that she had to called the EMS last night. Thing havent gotten better. She is looking be seen. Please advise  ?

## 2021-06-02 NOTE — Telephone Encounter (Signed)
Spoke with pt and she continues to have chest discomfort with exertion.  Becomes SOB.  Relieves with rest.  States EMS came out last night and EKG and vitals were good.  Not currently having any issues.  Scheduled pt to see Dr. Tamala Julian on Monday.  ER precautions reviewed.  Pt appreciative for assistance.  ?

## 2021-06-04 NOTE — Progress Notes (Signed)
Cardiology Office Note:    Date:  06/05/2021   ID:  Anne [name], DOB May 16, 1930, MRN [id number]  PCP:  Merlene Laughter, MD  Cardiologist:  Lesleigh Noe, MD   Referring MD: Merlene Laughter, MD   Chief Complaint  Patient presents with   Congestive Heart Failure   Chest Pain    History of Present Illness:    Anne [name] is a 88 y.o. female with a hx of anxiety disorder, hypertension, recurring non-cardiac chest pain, and history of asthma.  Most recent complaint includes a burning substernal discomfort.  EKG performed on June 01, 2021 is normal.  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  First-degree AV block is noted on the EKG.  Complaints include burning in the chest.  Occurs spontaneously.  He can last for hours.  She has had multiple emergency room visits.  She gets anxious when she feels discomfort in the chest or has shortness of breath.  She has also had occasional palpitation.  Since October she has had multiple procedures done including 30-day monitoring, coronary CTA with morphology, chest CT which excluded pulmonary emboli, 2D Doppler echocardiogram which demonstrated mild diastolic heart failure.  An EKG in the emergency room after a heart rate of 36 was palpated and revealed ventricular bigeminy and an underlying heart rate of 72 bpm.  She is here today for follow-up.  Still concerned about the burning discomfort that can occur spontaneously.  She has dyspnea on exertion.  Past Medical History:  Diagnosis Date   Allergic rhinitis    Arthritis    Asthma    Atypical chest pain    Collapse of right lung    Dyslipidemia    Gastroesophageal reflux disease    Glaucoma    Hematuria    Hyperlipidemia    Lung nodules    PVC's (premature ventricular contractions) 02/21/2021   Sinus bradycardia 02/21/2021    Past Surgical History:  Procedure Laterality Date   ANAL FISSURE REPAIR     BREAST BIOPSY Right     CATARACT EXTRACTION Bilateral    MASS EXCISION  04/11/2012   Procedure: EXCISION MASS;  Surgeon: Nicki Reaper, MD;  Location: [facility];  Service: Orthopedics;  Laterality: Right;  Excision of mass right upper arm   MEDIASTINOSCOPY  4/11   NASAL SINUS SURGERY     TOTAL ABDOMINAL HYSTERECTOMY W/ BILATERAL SALPINGOOPHORECTOMY      Current Medications: Current Meds  Medication Sig   albuterol (PROAIR HFA) 108 (90 Base) MCG/ACT inhaler INHALE 1-2 PUFFS INTO THE LUNGS EVERY 6 (SIX) HOURS AS NEEDED FOR WHEEZING OR SHORTNESS OF BREATH.   amLODipine (NORVASC) 5 MG tablet Take 1 tablet by mouth daily.   aspirin EC 81 MG EC tablet Take 1 tablet (81 mg total) by mouth daily. Swallow whole.   BREO ELLIPTA 100-25 MCG/INH AEPB INHALE 1 PUFF BY MOUTH EVERY DAY   Calcium Carb-Cholecalciferol 600-200 MG-UNIT TABS Take 1 tablet by mouth daily.   cetirizine (ZYRTEC) 10 MG tablet Take 10 mg by mouth every morning.   ferrous sulfate 325 (65 FE) MG tablet Take 325 mg by mouth 3 (three) times a week. Take on MWF   fluticasone (FLONASE) 50 MCG/ACT nasal spray Place 1 spray into both nostrils 2 (two) times daily as needed for allergies.    gabapentin (NEURONTIN) 300 MG capsule Take 1 capsule (300 mg total) by mouth daily. (Patient taking differently: Take 300 mg  by mouth at bedtime.)   latanoprost (XALATAN) 0.005 % ophthalmic solution Place 1 drop into both eyes at bedtime.   metoprolol succinate (TOPROL XL) 25 MG 24 hr tablet Take 1 tablet (25 mg total) by mouth daily.   Multiple Vitamin (MULTIVITAMIN) tablet Take 1 tablet by mouth every morning.    nitroGLYCERIN (NITROSTAT) 0.4 MG SL tablet Place 1 tablet (0.4 mg total) under the tongue every 5 (five) minutes as needed for chest pain (Up to 3 doses in 15 minutes.).   rosuvastatin (CRESTOR) 20 MG tablet Take 1 tablet (20 mg total) by mouth daily.   TURMERIC PO Take 1 capsule by mouth daily.     Allergies:   Other, Shellfish allergy, Latex,  Pravastatin, and Adhesive [tape]   Social History   Socioeconomic History   Marital status: Married    Spouse name: Not on file   Number of children: Not on file   Years of education: Not on file   Highest education level: Not on file  Occupational History   Occupation: retired  Tobacco Use   Smoking status: Former    Years: 4.00    Types: Cigarettes    Start date: 03/07/52    Quit date: 1960    Years since quitting: 63.2   Smokeless tobacco: Never   Tobacco comments:    social smoker  Building services engineer Use: Never used  Substance and Sexual Activity   Alcohol use: No    Alcohol/week: 0.0 standard drinks   Drug use: No   Sexual activity: Never    Birth control/protection: Surgical  Other Topics Concern   Not on file  Social History Narrative   Retired Public relations account executive.  Lives alone, husband passed away in Mar 07, 2017.  No children.   Right handed   One level home   Social Determinants of Health   Financial Resource Strain: Not on file  Food Insecurity: Not on file  Transportation Needs: Not on file  Physical Activity: Not on file  Stress: Not on file  Social Connections: Not on file     Family History: The patient's family history includes Breast cancer in her cousin; Cancer in her mother; Heart disease in her father. There is no history of Heart attack.  ROS:   Please see the history of present illness.    Anxiety concerning her overall health.  Lives alone.  Husband died multiple years ago.  Complaints/symptoms now concern her greatly.  All other systems reviewed and are negative.  EKGs/Labs/Other Studies Reviewed:    The following studies were reviewed today: MONITOR 03/09/2021  Study Highlights  Basic rhythm is NSR and SB PVC's are prevalent with burden of 1% No atrial fibrillation or ventricular tachycardia  Bilateral LE DVT STUDY 05/06/21: Summary:  RIGHT:  - There is no evidence of deep vein thrombosis in the lower extremity.     - No cystic  structure found in the popliteal fossa.     LEFT:  - No evidence of common femoral vein obstruction.      CT ANGIO CHEST PR Protocol 05/06/2021 IMPRESSION: 1. No acute findings. No pulmonary embolism. No thoracic aortic aneurysm or evidence of aortic dissection. 2. Three-vessel coronary artery calcifications. 3. Focal chronic atelectasis within the RIGHT perihilar lung. Lungs are otherwise clear. No evidence of pneumonia or pulmonary edema.   Aortic Atherosclerosis (ICD10-I70.0).  Coronary CT with morphology and FFR if indicated 02/20/2021: IMPRESSION: 1. Moderate CAD, CADRADS = 3. CT FFR will be performed and reported  separately.   2. Coronary calcium score of 615. She is out of the age range for percentile comparison   3. Normal coronary origin with right dominance.   4.  Aortic atherosclerosis.   ECHOCARDIOGRAM 02/20/2021 IMPRESSIONS     1. Left ventricular ejection fraction, by estimation, is 60 to 65%. The  left ventricle has normal function. The left ventricle has no regional  wall motion abnormalities. Left ventricular diastolic parameters are  consistent with Grade I diastolic  dysfunction (impaired relaxation).   2. Right ventricular systolic function is normal. The right ventricular  size is normal. There is normal pulmonary artery systolic pressure.   3. The mitral valve is grossly normal. Trivial mitral valve  regurgitation.   4. The aortic valve is tricuspid. Aortic valve regurgitation is not  visualized. Aortic valve sclerosis is present, with no evidence of aortic  valve stenosis.   5. The inferior vena cava is normal in size with greater than 50%  respiratory variability, suggesting right atrial pressure of 3 mmHg.   Comparison(s): No prior Echocardiogram.    EKG:  EKG performed on 06/01/2021 demonstrating normal sinus rhythm with first-degree AV block.  An EKG was again repeated on 05/08/2021 and was unchanged compared to the prior tracing and no  different than the one performed on 06/01/2021.  Today's EKG reveals normal sinus rhythm, first-degree AV block, biatrial abnormality.  Recent Labs: 02/20/2021: Magnesium 2.0; TSH 2.370 05/06/2021: ALT 23; B Natriuretic Peptide 240.6; BUN 25; Creatinine, Ser 1.05; Hemoglobin 12.4; Platelets 250; Potassium 3.8; Sodium 140  Recent Lipid Panel No results found for: CHOL, TRIG, HDL, CHOLHDL, VLDL, LDLCALC, LDLDIRECT  Physical Exam:    VS:  BP 122/64    Pulse 68    Ht 5\' 7"  (1.702 m)    Wt 160 lb 6.4 oz (72.8 kg)    SpO2 99%    BMI 25.12 kg/m     Wt Readings from Last 3 Encounters:  06/05/21 160 lb 6.4 oz (72.8 kg)  05/06/21 160 lb 4.4 oz (72.7 kg)  03/22/21 160 lb 3.2 oz (72.7 kg)     GEN: Healthy appearing. No acute distress HEENT: Normal NECK: No JVD. LYMPHATICS: No lymphadenopathy CARDIAC: No murmur. RRR no gallop, or edema. VASCULAR:  Normal Pulses. No bruits. RESPIRATORY:  Clear to auscultation without rales, wheezing or rhonchi  ABDOMEN: Soft, non-tender, non-distended, No pulsatile mass, MUSCULOSKELETAL: No deformity  SKIN: Warm and dry NEUROLOGIC:  Alert and oriented x 3 PSYCHIATRIC:  Normal affect   ASSESSMENT:    1. Precordial pain   2. Abnormal findings diagnostic imaging of heart and coronary circulation   3. Palpitations   4. Systolic hypertension    PLAN:    In order of problems listed above:  Myocardial perfusion imaging to see if we can demonstrate ischemia.  She may fall into the category of INOCA related to microvascular dysfunction or spasm. Coronary atherosclerosis demonstrated by plaque in coronaries and elevated calcium score. Low volume/burden PVCs with occasional bigeminy. Excellent blood pressure control. Diastolic heart failure/dysfunction.  Discussed possibly using SGLT2 therapy.  She wants to try to see if she can build endurance before getting on an additional medication.  Overall education and awareness concerning secondary risk prevention was  discussed in detail: LDL less than 70, hemoglobin A1c less than 7, blood pressure target less than 130/80 mmHg, >150 minutes of moderate aerobic activity per week, avoidance of smoking, weight control (via diet and exercise), and continued surveillance/management of/for obstructive sleep apnea.  Extraordinarily  long office visit related to counseling, review of all recent cardiovascular diagnostic data, and developing a diagnostic game plan going forward which may include attempting to treat diastolic heart failure with SGLT2.  Medication Adjustments/Labs and Tests Ordered: Current medicines are reviewed at length with the patient today.  Concerns regarding medicines are outlined above.  No orders of the defined types were placed in this encounter.  No orders of the defined types were placed in this encounter.   Patient Instructions  Medication Instructions:  Your physician recommends that you continue on your current medications as directed. Please refer to the Current Medication list given to you today.  *If you need a refill on your cardiac medications before your next appointment, please call your pharmacy*   Lab Work: None today  If you have labs (blood work) drawn today and your tests are completely normal, you will receive your results only by: MyChart Message (if you have MyChart) OR A paper copy in the mail If you have any lab test that is abnormal or we need to change your treatment, we will call you to review the results.   Testing/Procedures: Your physician has requested that you have a lexiscan myoview. For further information please visit [email or web address] Please follow instruction sheet, as given.    Follow-Up: At [facility], you and your health needs are our priority.  As part of our continuing mission to provide you with exceptional heart care, we have created designated Provider Care Teams.  These Care Teams include your primary Cardiologist (physician) and  Advanced Practice Providers (APPs -  Physician Assistants and Nurse Practitioners) who all work together to provide you with the care you need, when you need it.  We recommend signing up for the patient portal called "MyChart".  Sign up information is provided on this After Visit Summary.  MyChart is used to connect with patients for Virtual Visits (Telemedicine).  Patients are able to view lab/test results, encounter notes, upcoming appointments, etc.  Non-urgent messages can be sent to your provider as well.   To learn more about what you can do with MyChart, go to [email or web address].    Your next appointment:     Follow up as needed.     Provider:   Lesleigh Noe, MD {      Signed, Lesleigh Noe, MD  06/05/2021 11:27 AM    [facility]

## 2021-06-05 ENCOUNTER — Other Ambulatory Visit: Payer: Self-pay

## 2021-06-05 ENCOUNTER — Ambulatory Visit: Payer: Medicare PPO | Admitting: Interventional Cardiology

## 2021-06-05 ENCOUNTER — Encounter: Payer: Self-pay | Admitting: Interventional Cardiology

## 2021-06-05 VITALS — BP 122/64 | HR 68 | Ht 67.0 in | Wt 160.4 lb

## 2021-06-05 DIAGNOSIS — R931 Abnormal findings on diagnostic imaging of heart and coronary circulation: Secondary | ICD-10-CM

## 2021-06-05 DIAGNOSIS — R072 Precordial pain: Secondary | ICD-10-CM

## 2021-06-05 DIAGNOSIS — I10 Essential (primary) hypertension: Secondary | ICD-10-CM | POA: Diagnosis not present

## 2021-06-05 DIAGNOSIS — R002 Palpitations: Secondary | ICD-10-CM | POA: Diagnosis not present

## 2021-06-05 NOTE — Patient Instructions (Signed)
Medication Instructions:  ?Your physician recommends that you continue on your current medications as directed. Please refer to the Current Medication list given to you today. ? ?*If you need a refill on your cardiac medications before your next appointment, please call your pharmacy* ? ? ?Lab Work: ?None today  ?If you have labs (blood work) drawn today and your tests are completely normal, you will receive your results only by: ?MyChart Message (if you have MyChart) OR ?A paper copy in the mail ?If you have any lab test that is abnormal or we need to change your treatment, we will call you to review the results. ? ? ?Testing/Procedures: ?Your physician has requested that you have a lexiscan myoview. For further information please visit [email or web address]. Please follow instruction sheet, as given. ? ? ? ?Follow-Up: ?At [facility], you and your health needs are our priority.  As part of our continuing mission to provide you with exceptional heart care, we have created designated Provider Care Teams.  These Care Teams include your primary Cardiologist (physician) and Advanced Practice Providers (APPs -  Physician Assistants and Nurse Practitioners) who all work together to provide you with the care you need, when you need it. ? ?We recommend signing up for the patient portal called "MyChart".  Sign up information is provided on this After Visit Summary.  MyChart is used to connect with patients for Virtual Visits (Telemedicine).  Patients are able to view lab/test results, encounter notes, upcoming appointments, etc.  Non-urgent messages can be sent to your provider as well.   ?To learn more about what you can do with MyChart, go to [email or web address].   ? ?Your next appointment:   ?  ?Follow up as needed.   ? ? ?Provider:   ?Sinclair Grooms, MD { ? ?  ?

## 2021-06-06 ENCOUNTER — Telehealth (HOSPITAL_COMMUNITY): Payer: Self-pay | Admitting: *Deleted

## 2021-06-06 NOTE — Telephone Encounter (Signed)
Patient given detailed instructions per Myocardial Perfusion Study Information Sheet for the test on 06/09/2021 at 10:00. Patient notified to arrive 15 minutes early and that it is imperative to arrive on time for appointment to keep from having the test rescheduled. ? If you need to cancel or reschedule your appointment, please call the office within 24 hours of your appointment. . Patient verbalized understanding.Anne [name] ? ? ?

## 2021-06-09 ENCOUNTER — Ambulatory Visit (HOSPITAL_COMMUNITY): Payer: Medicare PPO | Attending: Cardiology

## 2021-06-09 ENCOUNTER — Other Ambulatory Visit: Payer: Self-pay

## 2021-06-09 DIAGNOSIS — R072 Precordial pain: Secondary | ICD-10-CM | POA: Diagnosis not present

## 2021-06-09 DIAGNOSIS — I10 Essential (primary) hypertension: Secondary | ICD-10-CM | POA: Diagnosis not present

## 2021-06-09 DIAGNOSIS — R002 Palpitations: Secondary | ICD-10-CM

## 2021-06-09 DIAGNOSIS — R931 Abnormal findings on diagnostic imaging of heart and coronary circulation: Secondary | ICD-10-CM

## 2021-06-09 LAB — MYOCARDIAL PERFUSION IMAGING
LV dias vol: 67 mL (ref 46–106)
LV sys vol: 18 mL
Nuc Stress EF: 74 %
Peak HR: 77 {beats}/min
Rest HR: 57 {beats}/min
Rest Nuclear Isotope Dose: 10.9 mCi
SDS: 0
SRS: 0
SSS: 0
ST Depression (mm): 0 mm
Stress Nuclear Isotope Dose: 32.3 mCi
TID: 0.97

## 2021-06-09 MED ORDER — TECHNETIUM TC 99M TETROFOSMIN IV KIT
32.3000 | PACK | Freq: Once | INTRAVENOUS | Status: DC | PRN
  Filled 2021-06-09: qty 33

## 2021-06-09 MED ORDER — REGADENOSON 0.4 MG/5ML IV SOLN: 0.4000 mg | Freq: Once | INTRAVENOUS | Status: DC

## 2021-06-09 MED ORDER — TECHNETIUM TC 99M TETROFOSMIN IV KIT
10.9000 | PACK | Freq: Once | INTRAVENOUS | Status: AC | PRN
  Administered 2021-06-09: 10.9 via INTRAVENOUS
  Filled 2021-06-09: qty 11

## 2021-06-12 ENCOUNTER — Telehealth: Payer: Self-pay | Admitting: Interventional Cardiology

## 2021-06-12 NOTE — Telephone Encounter (Signed)
Pt called back in returning call from [place] for results  ? ?Best number [phone] ?

## 2021-06-12 NOTE — Telephone Encounter (Signed)
Informed pt of results. Pt verbalized understanding. 

## 2021-06-16 ENCOUNTER — Ambulatory Visit: Payer: Medicare PPO | Admitting: Internal Medicine

## 2021-06-16 ENCOUNTER — Encounter: Payer: Self-pay | Admitting: Internal Medicine

## 2021-06-16 ENCOUNTER — Other Ambulatory Visit: Payer: Self-pay

## 2021-06-16 VITALS — BP 120/62 | HR 62 | Temp 97.9°F | Ht 67.0 in | Wt 158.0 lb

## 2021-06-16 DIAGNOSIS — R0600 Dyspnea, unspecified: Secondary | ICD-10-CM | POA: Diagnosis not present

## 2021-06-16 DIAGNOSIS — J4530 Mild persistent asthma, uncomplicated: Secondary | ICD-10-CM | POA: Diagnosis not present

## 2021-06-16 DIAGNOSIS — Z8679 Personal history of other diseases of the circulatory system: Secondary | ICD-10-CM

## 2021-06-16 DIAGNOSIS — R0989 Other specified symptoms and signs involving the circulatory and respiratory systems: Secondary | ICD-10-CM | POA: Diagnosis not present

## 2021-06-16 LAB — CBC WITH DIFFERENTIAL/PLATELET
Basophils Absolute: 0.1 10*3/uL (ref 0.0–0.1)
Basophils Relative: 1 % (ref 0.0–3.0)
Eosinophils Absolute: 0.2 10*3/uL (ref 0.0–0.7)
Eosinophils Relative: 4.3 % (ref 0.0–5.0)
HCT: 36.7 % (ref 36.0–46.0)
Hemoglobin: 12.1 g/dL (ref 12.0–15.0)
Lymphocytes Relative: 37.8 % (ref 12.0–46.0)
Lymphs Abs: 1.9 10*3/uL (ref 0.7–4.0)
MCHC: 32.9 g/dL (ref 30.0–36.0)
MCV: 92.3 fl (ref 78.0–100.0)
Monocytes Absolute: 0.7 10*3/uL (ref 0.1–1.0)
Monocytes Relative: 12.8 % — ABNORMAL HIGH (ref 3.0–12.0)
Neutro Abs: 2.3 10*3/uL (ref 1.4–7.7)
Neutrophils Relative %: 44.1 % (ref 43.0–77.0)
Platelets: 281 10*3/uL (ref 150.0–400.0)
RBC: 3.97 Mil/uL (ref 3.87–5.11)
RDW: 14.2 % (ref 11.5–15.5)
WBC: 5.1 10*3/uL (ref 4.0–10.5)

## 2021-06-16 LAB — BASIC METABOLIC PANEL
BUN: 23 mg/dL (ref 6–23)
CO2: 30 mEq/L (ref 19–32)
Calcium: 10.1 mg/dL (ref 8.4–10.5)
Chloride: 104 mEq/L (ref 96–112)
Creatinine, Ser: 1.16 mg/dL (ref 0.40–1.20)
GFR: 42.1 mL/min — ABNORMAL LOW (ref >60.00)
Glucose, Bld: 80 mg/dL (ref 70–99)
Potassium: 4.4 mEq/L (ref 3.5–5.1)
Sodium: 140 mEq/L (ref 135–145)

## 2021-06-16 LAB — NITRIC OXIDE: Nitric Oxide: 12

## 2021-06-16 LAB — BRAIN NATRIURETIC PEPTIDE: Pro B Natriuretic peptide (BNP): 221 pg/mL — ABNORMAL HIGH (ref 0.0–100.0)

## 2021-06-16 NOTE — Progress Notes (Signed)
She is social distancing, masking and has had a vaccines.  She continues to be active.  ACT score is 25 and shows good asthma control. ? ? ? ?Asthma Control Test ACT Total Score  ?05/06/2020 25  ? ? ? ? ? ?03/09/2021 Follow up : Asthma  ?Patient complains of 3 days of increased cough and congestion. Mucus is thick and hard to get up but remains clear. Remains active. Drives and lives alone at home . Has some mild sinus drainage  ?Remains on BREO daily . No increased Albuterol inhaler use. No wheezing .  ?No fever,edema , abd pain , n/v/d. Does have some epigastric burning from time to time. No dysphagia.  ? ?Recent cardiac workup last month . Echo 02/20/21 showed EF 60%, Grade 1 DD ,Normal PAP  ?Chest xray nad Coronary CT with no lung issues , felt CAD stable -no significant stenosis per cards note.  ?Placed on PPI from PCP but has not started. Cardiac monitor to be placed.  ?TEST/EVENTS :  ?Exhaled nitric oxide test May 2018: 17 ppb ?High resolution CT chest August 2016 showed no evidence of interstitial lung disease, chronic collapse of right middle lobe ?MEdiastinaoscopy 07/27/2009 - Benign node of 4R and 4L. ANthracotoic pigment +. No granuoma. Non malignant ?PFT 10/28/2014 FEV1  94, ratio 54, FVC 135, no significant bronchodilator response, mid flows reversibility, DLCO 65% ? ?OV 06/16/2021 ? ?Subjective:  ?Patient ID: Anne [name], female , DOB: 1930-11-10 , age 88 y.o. , MRN: [id number]  ? ? ? ? ?HPI ?Chief Complaint  ?Patient presents with  ? Acute Visit  ?  cough   ? ? ?Referring provider: ?[name], MD ? ?HPI: ?88 year old female never smoker followed for mild persistent asthma ?History of diastolic dysfunction ?Known chronic right middle lobe collapse/ lymphadenopathy -s/p 07/2007 FOB +psuedomonas ?  ?Test ?Exhaled nitric oxide test May 2018: 17 ppb ?High resolution CT chest August 2016 showed no evidence of interstitial lung disease, chronic collapse of right middle lobe ?MEdiastinaoscopy 07/27/2009 - Benign node of 4R and 4L. ANthracotoic pigment +. No granuoma. Non malignant ?PFT 10/28/2014 FEV1 94, ratio 54, FVC 135, no significant bronchodilator response, mid flows reversibility, DLCO 65% ?  ? ?04/25/2017 Acute OV : Asthma  ?Patient presents for an acute office visit.  She complains of 3 weeks of cough ,congestion sinus congestion, drainage , wheezing . Seen PCP dx w/ bronchitis , rx  , Doxycycline and Prednisone . Feels some better but still has chest congestion and tightness .  ?She remains on Breo daily.  ? ? ?Appetite is okay w/ no nv/d.  ?No chest pain, orthopnea or ede ? ? ?OV 06/05/2017 ? ?Chief Complaint  ?Patient presents with  ? Follow-up  ?  Pt states she has been doing good since last visit. Pt states she does become SOB with exertion. Denies any cough or CP.  ? ? ?Fu Mild persiostent asthma with associated diastolic dysfunction ? ?Contines on broe. Overall stable. But last  4 month has has had 2 surgeries for hammer toe on left food. So less active. Correlating with this more dyspnea on exertion than usual. Also, found to have mold in the bathroom and professionally removed . Has not affected her but she is asking for feno and walk test both of which aare normal. She wants sample breo ? ? ? ? ? ? ?OV 12/17/2017 ? ?Subjective:  ?Patient ID: Anne [name], female , DOB: [birth year] , age 18 y.o. , MRN: [id number] , ADDRESS: [address] ?[place] [address] ? ? ?12/17/2017 -    ?Chief Complaint  ?Patient presents with  ? Follow-up  ?  Pt states she has been doing well since last visit and denies any complaints.  ? ?Fu Mild persiostent asthma with associated diastolic dysfunction ? ?HPI ?Darrold Span 88 y.o. -  Mild persistent asthma follow-up. Recently she was switched to Brio on Qvar. This is helped his symptoms. In addition she is in the mold in the house.She feels great. She is somewhat she has done well. No interim asthma exacerbations. She will have a high dose flu shot today. There are really no other issues other than some mild recurrence of costochondral chest pain in the last few days is a recurrent problem for her. ? ? ? ? ?Results for VENISHA, BOEHNING (MRN [id number]) as of 06/05/2017 14:14 ? Ref. Range 08/07/2016 12:01 02/12/2017 11:30 06/05/2017 13:04  ?Nitric Oxide Unknown '17 18 20  '$ ? ?OV 01/15/2019 ? ?Subjective:  ?Patient ID: Anne [name], female , DOB: 1930-11-05 , age 64 y.o. , MRN: [id number] , ADDRESS: [address] ?[place] [address] ? ? ?01/15/2019 -   ?Chief Complaint  ?Patient presents with  ? Follow-up  ?  Pt states her breathing is doing well. Pt denies any increase in SOB, cough, CP/tightness, f/c/s, body aches.   ? ?88 year old female never smoker followed for mild persistent asthma ?History of diastolic dysfunction ?Known chronic right middle lobe collapse/ lymphadenopathy -s/p 07/2007 FOB +psuedomonas -  MEdiastinaoscopy 07/27/2009 - Benign node of 4R and 4L. ANthracotoic pigment +. No granuoma. Non malignant ? ? ?HPI ?Darrold Span 88 y.o. -presents for follow-up.  Last seen September 2019 by myself.  In the interim visit with the nurse practitioner.  She says the Trail works well for her.  She says asthma is really well controlled.  No new symptoms.  She is up-to-date with the flu shot.  She is extremely active.  She is social distancing and isolating quite well and only doing low risk COVID-19 activities.  There are no new complaints or issues. ? ?OV  08/17/2019 ? ?Subjective:  ?Patient ID: Anne [name], female , DOB: Jun 03, 1930 , age 26 y.o. , MRN: [id number] , ADDRESS: [address] ?[place] [address] ? ? ?08/17/2019 -   ?Chief Complaint  ?Patient presents with  ? Follow-up  ?  Pt states she has been doing well since last visit and denies any complaints.  ? ?Female never smoker followed for mild persistent asthma ? ?History of diastolic dysfunction ? ?Known chronic right middle lobe collapse/ lymphadenopathy -s/p 07/2007 FOB +psuedomonas - MEdiastinaoscopy 07/27/2009 - Benign node of 4R and 4L. ANthracotoic pigment +. No granuoma. Non malignant ? - last cxr 2019 and clear ? ?HPI ?Darrold Span 88 y.o. -presents for follow-up.  Since last visit she has been doing well.  She has not had a Covid vaccine.  She continues social distancing.  She continues Breo.  She does not want to downsize the inhaler because of Covid.  She feels she needs extra protection.  She wants to maintain her health.  She is glad she has done well so far overall.  Her last chest x-ray was in 2019 she is willing to have another chest x-ray.  In terms of asthma asthma control question is 0 out of 5.  She not waking up in the middle of the night when she wakes up she has no problems she not having any activity limitation because of asthma.  She had wheezing because of asthma she not having any shortness of breath because of asthma she not using albuterol for rescue. ? ? ? ? ?ROS ?- per HPI ? ?OV 05/06/2020 ? ?Subjective:  ?Patient ID: Anjani Feuerborn, female , DOB: [birth year] , age 76 y.o. , MRN: [id number] , ADDRESS: [address] ?[place] [address] ?PCP [name], MD ?Patient Care Team: ?[name], MD as PCP - General (Internal Medicine) ?Daryll Brod, MD as Consulting Physician (Orthopedic Surgery) ?[name], DO as Consulting Physician (Neurology) ? ?This Provider for this visit: Treatment Team:  ?Attending Provider: Brand Males, MD ? ? ? ?05/06/2020 -   ?Chief  Complaint  ?Patient presents with  ? Follow-up  ?  No compaints  ? ?Female never smoker followed for mild persistent asthma ? ?History of diastolic dysfunction ? ?Known chronic right middle lobe collapse/ lymphadenopathy -s/p 07/2007 FOB +psuedomonas - MEdiastinaoscopy 07/27/2009 - Benign node of 4R and 4L. ANthracotoic pigment +. No granuoma. Non malignant ? - last cxr 2019 and clear ? ? ?HPI ?Chyrel Masson 89 y.o. -returns for follow-up for her mild persistent asthma.  She is doing well.  ACT control score is 25.  She was asking about a chest x-ray but I reminded her that in May 2021 she had a chest x-ray that was clear.  I showed her the film.  She was satisfied with that.  There are no new issues she wants refills on her inhalers.

## 2021-06-16 NOTE — Patient Instructions (Addendum)
ICD-10-CM   ?1. Mild persistent asthma, unspecified whether complicated  L93.57 Nitric oxide  ?  ?2. Dyspnea, unspecified type  R06.00   ?  ?3. Chest congestion  R09.89   ?  ?4. History of diastolic dysfunction  S17.79   ?  ? ? ?-Unclear nature of symptoms.  It could be related to anxiety as you mention.  It could be related to physical deconditioning as you mentioned.  But it could also be related to stiff heart muscle or asthma going out of control though the breathing test today was normal ? ? ? ?Plan ?- Check CBC with differential, IgE, RAST allergy panel ?-Check BNP [slightly elevated in February 2023] ?-Check chemistry blood work ?-No need for imaging [you had 2 CT scans of the chest in November 2022 and February 2023] ?-Do spirometry with DLCO pre and postbronchodilator ?-Meanwhile continue Breo daily and albuterol as needed ?-Continue Zyrtec as before ?-Continue other medications as before ? ?Follow-up ?- Visit with nurse practitioner in the next few weeks to discuss these test results and plan the next setp ? - if all ok, refer rehab ?

## 2021-06-18 ENCOUNTER — Telehealth: Payer: Self-pay | Admitting: Internal Medicine

## 2021-06-18 NOTE — Telephone Encounter (Signed)
?Hi Anne [name] ? ?I saw Anne [name] . She updated me of her symptoms that you have been working with in her. I am puzzzled as well. I checked her BNP and is some high compared to the past (Below). Noticed you recommended SGLT2 Rx. Wondreing if her syjmptoms are diast dysfunction? ? ?Thanks ? ? ? ?SIGNATURE  ? ? ?Dr. Brand Males, M.D., F.C.C.P,  ?Pulmonary and Critical Care Medicine ?Staff Physician, [place] ?Center Director - Interstitial Lung Disease  Program  ?Pulmonary [place] [facility] ?[place], [place], [address] ? ?NPI Number:  NPI #[id number] ?DEA Number: [id number] ? ?Pager: [phone], If no answer  -> Check AMION or Try [phone] ?Telephone (clinical office): [phone] ?Telephone (research): [phone] ? ?11:34 PM ?06/18/2021 ? ? ? ? ? ? ? ? ? Latest Reference Range & Units 08/30/14 14:20 09/05/14 11:07 10/09/14 11:37 03/17/15 11:20 01/14/17 14:53 05/06/21 13:47 06/16/21 10:12  ?B Natriuretic Peptide 0.0 - 100.0 pg/mL 124.8 (H) 106.1 (H) 69.6 103.9 (H)  240.6 (H)   ?Pro B Natriuretic peptide (BNP) 0.0 - 100.0 pg/mL     65.0  221.0 (H)  ?(H): Data is abnormally high ? ? ? ?Current Outpatient Medications:  ?  albuterol (PROAIR HFA) 108 (90 Base) MCG/ACT inhaler, INHALE 1-2 PUFFS INTO THE LUNGS EVERY 6 (SIX) HOURS AS NEEDED FOR WHEEZING OR SHORTNESS OF BREATH., Disp: 18 g, Rfl: 3 ?  amLODipine (NORVASC) 5 MG tablet, Take 1 tablet by mouth daily., Disp: , Rfl:  ?  aspirin EC 81 MG EC tablet, Take 1 tablet (81 mg total) by mouth daily. Swallow whole., Disp: 30 tablet, Rfl: 11 ?  BREO ELLIPTA 100-25 MCG/INH AEPB, INHALE 1 PUFF BY MOUTH EVERY DAY, Disp: 60 each, Rfl: 3 ?  Calcium Carb-Cholecalciferol 600-200 MG-UNIT TABS, Take 1 tablet by mouth daily., Disp: , Rfl:  ?  cetirizine (ZYRTEC) 10 MG tablet, Take 10 mg by mouth every morning., Disp: , Rfl:  ?  ferrous sulfate 325 (65 FE) MG tablet, Take 325 mg by mouth 3 (three) times a week. Take on  MWF, Disp: , Rfl:  ?  fluticasone (FLONASE) 50 MCG/ACT nasal spray, Place 1 spray into both nostrils 2 (two) times daily as needed for allergies. , Disp: , Rfl:  ?  gabapentin (NEURONTIN) 300 MG capsule, Take 1 capsule (300 mg total) by mouth daily. (Patient taking differently: Take 300 mg by mouth at bedtime.), Disp: 90 capsule, Rfl: 3 ?  latanoprost (XALATAN) 0.005 % ophthalmic solution, Place 1 drop into both eyes at bedtime., Disp: , Rfl:  ?  metoprolol succinate (TOPROL XL) 25 MG 24 hr tablet, Take 1 tablet (25 mg total) by mouth daily., Disp: 90 tablet, Rfl: 3 ?  Multiple Vitamin (MULTIVITAMIN) tablet, Take 1 tablet by mouth every morning. , Disp: , Rfl:  ?  nitroGLYCERIN (NITROSTAT) 0.4 MG SL tablet, Place 1 tablet (0.4 mg total) under the tongue every 5 (five) minutes as needed for chest pain (Up to 3 doses in 15 minutes.)., Disp: 25 tablet, Rfl: 4 ?  rosuvastatin (CRESTOR) 20 MG tablet, Take 1 tablet (20 mg total) by mouth daily., Disp: 30 tablet, Rfl: 6 ?  TURMERIC PO, Take 1 capsule by mouth daily., Disp: , Rfl:  ?No current facility-administered medications for this visit. ? ?Facility-Administered Medications Ordered in Other Visits:  ?  regadenoson (LEXISCAN) injection SOLN 0.4 mg, 0.4 mg, Intravenous, Once, Turner, Eber Hong, MD ?  regadenoson (LEXISCAN) injection SOLN 0.4 mg, 0.4 mg, Intravenous, Once, Donato Heinz, MD ?  technetium tetrofosmin (TC-MYOVIEW) injection 81.8 millicurie, 56.3 millicurie, Intravenous, Once PRN, Donato Heinz, MD ? ?

## 2021-06-19 LAB — RESPIRATORY ALLERGY PROFILE REGION II ~~LOC~~
Allergen, A. alternata, m6: <0.1 kU/L
Allergen, Cedar tree, t12: <0.1 kU/L
Allergen, Comm Silver Birch, t9: <0.1 kU/L
Allergen, Cottonwood, t14: 0.16 kU/L — ABNORMAL HIGH
Allergen, D pternoyssinus,d7: <0.1 kU/L
Allergen, Mouse Urine Protein, e78: <0.1 kU/L
Allergen, Mulberry, t76: <0.1 kU/L
Allergen, Oak,t7: 0.1 kU/L — ABNORMAL HIGH
Allergen, P. notatum, m1: <0.1 kU/L
Aspergillus fumigatus, m3: 0.11 kU/L — ABNORMAL HIGH
Bermuda Grass: <0.1 kU/L
Box Elder IgE: <0.1 kU/L
CLADOSPORIUM HERBARUM (M2) IGE: <0.1 kU/L
COMMON RAGWEED (SHORT) (W1) IGE: <0.1 kU/L
Cat Dander: <0.1 kU/L
Class: 0
Class: 0
Class: 0
Class: 0
Class: 0
Class: 0
Class: 0
Class: 0
Class: 0
Class: 0
Class: 0
Class: 0
Class: 0
Class: 0
Class: 0
Class: 0
Class: 0
Class: 0
Class: 0
Cockroach: <0.1 kU/L
D. farinae: <0.1 kU/L
Dog Dander: <0.1 kU/L
Elm IgE: 0.16 kU/L — ABNORMAL HIGH
IgE (Immunoglobulin E), Serum: 314 kU/L — ABNORMAL HIGH (ref <=114)
Johnson Grass: <0.1 kU/L
Pecan/Hickory Tree IgE: 0.11 kU/L — ABNORMAL HIGH
Rough Pigweed  IgE: <0.1 kU/L
Sheep Sorrel IgE: <0.1 kU/L
Timothy Grass: <0.1 kU/L

## 2021-06-19 LAB — INTERPRETATION:

## 2021-06-19 LAB — IGE: IgE (Immunoglobulin E), Serum: 288 kU/L — ABNORMAL HIGH (ref <=114)

## 2021-06-20 ENCOUNTER — Telehealth: Payer: Self-pay | Admitting: Internal Medicine

## 2021-06-20 NOTE — Telephone Encounter (Signed)
Please let Anne [name] know that her blood allergy test that I did is slightly positive for overall IgE and also mild levels of [place] tree, oak ? ?Plan ?- This might be enough to make you feel congested.  If she is interested we could refer her to an allergist ? ?[place] ? ? ? ?SIGNATURE  ? ? ?Dr. Brand Males, M.D., F.C.C.P,  ?Pulmonary and Critical Care Medicine ?Staff Physician, [place] ?Center Director - Interstitial Lung Disease  Program  ?Pulmonary [place] [facility] ?[place], [place], [address] ? ?NPI Number:  NPI #[id number] ?DEA Number: [id number] ? ?Pager: [phone], If no answer  -> Check AMION or Try [phone] ?Telephone (clinical office): [phone] ?Telephone (research): [phone] ? ?3:03 PM ?06/20/2021 ? ?

## 2021-06-20 NOTE — Telephone Encounter (Signed)
Attempted to call pt but unable to reach. Left message for him to return call. °

## 2021-06-22 ENCOUNTER — Ambulatory Visit: Payer: Medicare PPO | Admitting: Internal Medicine

## 2021-06-23 ENCOUNTER — Encounter: Payer: Self-pay | Admitting: *Deleted

## 2021-06-23 ENCOUNTER — Telehealth: Payer: Self-pay | Admitting: Internal Medicine

## 2021-06-23 NOTE — Telephone Encounter (Signed)
Attempted to call pt but unable to reach. Left message for her to return call. Due to multiple attempts trying to reach pt without any success, letter will be sent to pt and encounter will be closed. ?

## 2021-06-23 NOTE — Telephone Encounter (Signed)
Spoke with pt and reviewed blood work results as dictated by Dr. Chase Caller. Pt is going to think about the Allergy referral and let us know it this is something she wants to f/u with. Pt requested refill of Breo which was sent into verified pharmacy. Nothing further needed at this time.  ?

## 2021-06-30 MED ORDER — FLUTICASONE FUROATE-VILANTEROL 100-25 MCG/ACT IN AEPB: 1.0000 | INHALATION_SPRAY | Freq: Every day | RESPIRATORY_TRACT | 11 refills | Status: DC

## 2021-06-30 NOTE — Telephone Encounter (Signed)
Patient states needs refill for Breo inhaler. Pharmacy is [facility]. Patient phone number is [phone]. ?

## 2021-06-30 NOTE — Telephone Encounter (Signed)
Breo sent to preferred pharmacy.  ?She would like to discuss allergy referral at next OV.  ?Patient is aware and voiced her understanding.  ? ?

## 2021-07-11 DIAGNOSIS — H401112 Primary open-angle glaucoma, right eye, moderate stage: Secondary | ICD-10-CM | POA: Diagnosis not present

## 2021-07-11 DIAGNOSIS — H401121 Primary open-angle glaucoma, left eye, mild stage: Secondary | ICD-10-CM | POA: Diagnosis not present

## 2021-07-14 ENCOUNTER — Encounter: Payer: Self-pay | Admitting: Nurse Practitioner

## 2021-07-14 ENCOUNTER — Ambulatory Visit (INDEPENDENT_AMBULATORY_CARE_PROVIDER_SITE_OTHER): Payer: Medicare PPO | Admitting: Internal Medicine

## 2021-07-14 ENCOUNTER — Ambulatory Visit: Payer: Medicare PPO | Admitting: Nurse Practitioner

## 2021-07-14 DIAGNOSIS — J449 Chronic obstructive pulmonary disease, unspecified: Secondary | ICD-10-CM | POA: Diagnosis not present

## 2021-07-14 DIAGNOSIS — J45909 Unspecified asthma, uncomplicated: Secondary | ICD-10-CM | POA: Diagnosis not present

## 2021-07-14 DIAGNOSIS — Z9109 Other allergy status, other than to drugs and biological substances: Secondary | ICD-10-CM | POA: Diagnosis not present

## 2021-07-14 DIAGNOSIS — J4530 Mild persistent asthma, uncomplicated: Secondary | ICD-10-CM | POA: Diagnosis not present

## 2021-07-14 LAB — PULMONARY FUNCTION TEST
DL/VA % pred: 96 %
DL/VA: 3.81 ml/min/mmHg/L
DLCO cor % pred: 89 %
DLCO cor: 18.08 ml/min/mmHg
DLCO unc % pred: 85 %
DLCO unc: 17.31 ml/min/mmHg
FEF 25-75 Post: 0.99 L/sec
FEF 25-75 Pre: 0.81 L/sec
FEF2575-%Change-Post: 22 %
FEF2575-%Pred-Post: 81 %
FEF2575-%Pred-Pre: 66 %
FEV1-%Change-Post: 8 %
FEV1-%Pred-Post: 110 %
FEV1-%Pred-Pre: 102 %
FEV1-Post: 1.78 L
FEV1-Pre: 1.64 L
FEV1FVC-%Change-Post: 7 %
FEV1FVC-%Pred-Pre: 82 %
FEV6-%Change-Post: -1 %
FEV6-%Pred-Post: 134 %
FEV6-%Pred-Pre: 136 %
FEV6-Post: 2.66 L
FEV6-Pre: 2.69 L
FEV6FVC-%Pred-Post: 104 %
FEV6FVC-%Pred-Pre: 104 %
FVC-%Change-Post: 0 %
FVC-%Pred-Post: 131 %
FVC-%Pred-Pre: 130 %
FVC-Post: 2.7 L
FVC-Pre: 2.69 L
Post FEV1/FVC ratio: 66 %
Post FEV6/FVC ratio: 100 %
Pre FEV1/FVC ratio: 61 %
Pre FEV6/FVC Ratio: 100 %
RV % pred: 137 %
RV: 3.71 L
TLC % pred: 115 %
TLC: 6.36 L

## 2021-07-14 MED ORDER — TRELEGY ELLIPTA 100-62.5-25 MCG/ACT IN AEPB: 1.0000 | INHALATION_SPRAY | Freq: Every day | RESPIRATORY_TRACT | 0 refills | Status: DC

## 2021-07-14 NOTE — Addendum Note (Signed)
Addended by: Fran Lowes on: 07/14/2021 01:58 PM ? ? Modules accepted: Orders ? ?

## 2021-07-14 NOTE — Assessment & Plan Note (Addendum)
Very mild obstructive airway disease with ratio 66 and increased lung volumes, consistent with mild COPD. Add LAMA to regimen with step up to Trelegy.  ?

## 2021-07-14 NOTE — Assessment & Plan Note (Addendum)
Stable. Well-controlled on Zyrtec and flonase. ?

## 2021-07-14 NOTE — Patient Instructions (Signed)
Full PFT performed today. °

## 2021-07-14 NOTE — Assessment & Plan Note (Addendum)
Stable. Progressive DOE over the past year. Feeling better since last visit. No significant BD; there is some midflow reversibility. Will trial step up to Trelegy- provided with samples. Advised to notify if she notices an improvement and we will send rx. Continue trigger prevention with Zyrtec.  ? ?Patient Instructions  ?Continue Albuterol inhaler 2 puffs every 6 hours as needed for shortness of breath or wheezing. Notify if symptoms persist despite rescue inhaler/neb use. ?Continue Zyrtec 10 mg daily ?Continue flonase nasal spray 1-2 sprays each nostril as needed for allergies/nasal congestion ? ?Stop Breo. Trial Trelegy 1 puff daily. Brush tongue and rinse mouth afterwards  ? ?Follow up in 6 weeks with Dr. Chase Caller or Alanson Aly. If symptoms do not improve or worsen, please contact office for sooner follow up or seek emergency care. ? ? ?

## 2021-07-14 NOTE — Progress Notes (Signed)
Full PFT performed today. °

## 2021-07-14 NOTE — Progress Notes (Signed)
? ?@Patient  ID: Anne [name], female    DOB: 02-Apr-1931, 88 y.o.   MRN: [id number] ? ?Chief Complaint  ?Patient presents with  ? Follow-up  ?  Follow up. Patient has no complaints.   ? ? ?Referring provider: ?Merlene Laughter, MD ? ?HPI: ?88 year old female, former social smoker followed for mild persistent asthma, chronic right middle lobe collapse and DOE.  She has history of lymphadenopathy status post FOB with Pseudomonas.  She is a patient Dr. Jane Canary and last seen in office 06/16/2021.  Past medical history significant for diastolic dysfunction, sinus bradycardia, GERD, OA, trigger finger right thumb. ? ?TEST/EVENTS:  ?02/20/2021 echocardiogram: EF 60-65%. G1DD present. Normal PASP. Trivial MR. ?05/06/2021 CTA chest: no PE. There is scattered atherosclerosis. No LAD identified. There is focal chronic atelectasis within the right perihilar lung. Also chronic pleuroparenchymal scarring/fibrosis at lung apices. Otherwise, the lungs are clear.  ?07/14/2021 PFTs: FVC 131, FEV1 110, ratio 66, TLC 115, DLCOunc 89. No BD; midflow reversibility. Very mild obstructive airway disease with increased lung volumes and normal diffusion capacity.  ? ?06/16/2021: OV with Dr. Marchelle Gearing.  Treated in December 2022 with acute exacerbation.  Given 3 days of prednisone.  In meantime has continued on Breo.  Reported that ever since Thanksgiving she has been dealing with congestion in her chest.  Also has some increased shortness of breath with exertion and PND.  Feels as though respiratory status is worse than it was a year ago.  Definitely more sedentary than she was prior to the pandemic.  Recent CT chest was stable with small right middle lobe collapse and no evidence of ILD.  Does have an elevated BNP from February 2023.  Most recent echocardiogram from November 2022 with diastolic dysfunction, grade 1. FeNO 12 ppb.  ACT 25.  CBC with differential, IgE and RAST ordered.  Recheck BNP and be met.  Ordered spirometry with DLCO  pre and postbronchodilator.  Continued Breo daily,, as needed albuterol and Zyrtec in interim. ? ?07/14/2021: Today-follow-up ?Patient presents today for follow-up after pulmonary function testing. She has a very mild obstructive airway disease with increased lung volumes and without significant bronchodilator response. No evidence of restriction. Today, she reports feeling well. Improved since last visit. Still having some SOB upon exertion, progressive over the past year. Very minimal wheeze at time. Cough has improved. Denies any leg swelling, orthopnea, PND, chest pain. She continues on [place] daily. She did receive a letter from her insurance company who said they do not cover Breo anymore. She rarely has to use albuterol. Continues on Zyrtec daily for allergies, which are well controlled.  ? ?Allergies  ?Allergen Reactions  ? Other Shortness Of Breath  ?  Pt states she has asthma and seafood causes breathing problems.   ? Shellfish Allergy Shortness Of Breath  ? Latex Itching  ? Pravastatin Other (See Comments)  ? Adhesive [Tape] Rash  ? ? ?Immunization History  ?Administered Date(s) Administered  ? Influenza Split 12/21/2009, 01/09/2017, 12/25/2017  ? Influenza Whole 04/04/2009  ? Influenza, High Dose Seasonal PF 01/12/2015, 01/18/2016, 12/31/2016, 12/17/2017, 12/02/2018, 12/31/2018, 12/08/2019  ? Influenza,inj,Quad PF,6+ Mos 01/03/2015, 12/02/2015  ? Influenza-Unspecified 02/14/2014, 01/13/2021  ? PFIZER Comirnaty(Gray Top)Covid-19 Tri-Sucrose Vaccine 09/01/2020  ? PFIZER(Purple Top)SARS-COV-2 Vaccination 04/24/2019, 05/15/2019, 12/29/2019  ? Pneumococcal Conjugate-13 01/12/2015  ? Pneumococcal Polysaccharide-23 08/04/1997, 06/01/2003, 01/30/2017  ? Pneumococcal-Unspecified 01/03/2015  ? Tdap 09/20/2015  ? Zoster, Live 07/06/2013, 01/11/2020, 07/11/2020  ? ? ?Past Medical History:  ?Diagnosis Date  ? Allergic rhinitis   ?  Arthritis   ? Asthma   ? Atypical chest pain   ? Collapse of right lung   ? Dyslipidemia    ? Gastroesophageal reflux disease   ? Glaucoma   ? Hematuria   ? Hyperlipidemia   ? Lung nodules   ? PVC's (premature ventricular contractions) 02/21/2021  ? Sinus bradycardia 02/21/2021  ? ? ?Tobacco History: ?Social History  ? ?Tobacco Use  ?Smoking Status Former  ? Years: 4.00  ? Types: Cigarettes  ? Start date: 57  ? Quit date: 17  ? Years since quitting: 63.3  ?Smokeless Tobacco Never  ?Tobacco Comments  ? social smoker  ? ?Counseling given: Not Answered ?Tobacco comments: social smoker ? ? ?Outpatient Medications Prior to Visit  ?Medication Sig Dispense Refill  ? albuterol (PROAIR HFA) 108 (90 Base) MCG/ACT inhaler INHALE 1-2 PUFFS INTO THE LUNGS EVERY 6 (SIX) HOURS AS NEEDED FOR WHEEZING OR SHORTNESS OF BREATH. 18 g 3  ? amLODipine (NORVASC) 5 MG tablet Take 1 tablet by mouth daily.    ? aspirin EC 81 MG EC tablet Take 1 tablet (81 mg total) by mouth daily. Swallow whole. 30 tablet 11  ? BREO ELLIPTA 100-25 MCG/INH AEPB INHALE 1 PUFF BY MOUTH EVERY DAY 60 each 3  ? Calcium Carb-Cholecalciferol 600-200 MG-UNIT TABS Take 1 tablet by mouth daily.    ? cetirizine (ZYRTEC) 10 MG tablet Take 10 mg by mouth every morning.    ? ferrous sulfate 325 (65 FE) MG tablet Take 325 mg by mouth 3 (three) times a week. Take on MWF    ? fluticasone (FLONASE) 50 MCG/ACT nasal spray Place 1 spray into both nostrils 2 (two) times daily as needed for allergies.     ? fluticasone furoate-vilanterol (BREO ELLIPTA) 100-25 MCG/ACT AEPB Inhale 1 puff into the lungs daily. 60 each 11  ? gabapentin (NEURONTIN) 300 MG capsule Take 1 capsule (300 mg total) by mouth daily. (Patient taking differently: Take 300 mg by mouth at bedtime.) 90 capsule 3  ? latanoprost (XALATAN) 0.005 % ophthalmic solution Place 1 drop into both eyes at bedtime.    ? metoprolol succinate (TOPROL XL) 25 MG 24 hr tablet Take 1 tablet (25 mg total) by mouth daily. 90 tablet 3  ? Multiple Vitamin (MULTIVITAMIN) tablet Take 1 tablet by mouth every morning.     ?  nitroGLYCERIN (NITROSTAT) 0.4 MG SL tablet Place 1 tablet (0.4 mg total) under the tongue every 5 (five) minutes as needed for chest pain (Up to 3 doses in 15 minutes.). 25 tablet 4  ? rosuvastatin (CRESTOR) 20 MG tablet Take 1 tablet (20 mg total) by mouth daily. 30 tablet 6  ? TURMERIC PO Take 1 capsule by mouth daily.    ? ?Facility-Administered Medications Prior to Visit  ?Medication Dose Route Frequency Provider Last Rate Last Admin  ? regadenoson (LEXISCAN) injection SOLN 0.4 mg  0.4 mg Intravenous Once Quintella Reichert, MD      ? regadenoson (LEXISCAN) injection SOLN 0.4 mg  0.4 mg Intravenous Once Little Ishikawa, MD      ? technetium tetrofosmin (TC-MYOVIEW) injection 32.3 millicurie  32.3 millicurie Intravenous Once PRN Little Ishikawa, MD      ? ? ? ?Review of Systems:  ? ?Constitutional: No weight loss or gain, night sweats, fevers, chills, fatigue, or lassitude. ?HEENT: No headaches, difficulty swallowing, tooth/dental problems, or sore throat. No sneezing, itching, ear ache, nasal congestion, or post nasal drip ?CV:  No chest pain, orthopnea, PND,  swelling in lower extremities, anasarca, dizziness, palpitations, syncope ?Resp: +shortness of breath with exertion (progressive over the past year); very rare wheeze. No excess mucus or change in color of mucus. No productive or non-productive. No hemoptysis. No chest wall deformity ?GI:  No heartburn, indigestion, abdominal pain, nausea, vomiting, diarrhea, change in bowel habits, loss of appetite, bloody stools.  ?Skin: No rash, lesions, ulcerations ?MSK:  No joint pain or swelling.  No decreased range of motion.  No back pain. ?Neuro: No dizziness or lightheadedness.  ?Psych: No depression or anxiety. Mood stable.  ? ? ? ?Physical Exam: ? ?BP (!) 146/52 (BP Location: Right Arm, Patient Position: Sitting, Cuff Size: Normal)   Pulse 60   Temp 98.5 ?F (36.9 ?C) (Oral)   Ht 5\' 7"  (1.702 m)   Wt 156 lb (70.8 kg)   SpO2 100%   BMI 24.43  kg/m?  ? ?GEN: Pleasant, interactive, well-appearing; in no acute distress ?HEENT:  Normocephalic and atraumatic. PERRLA. Sclera white. Nasal turbinates pink, moist and patent bilaterally. No rhinorrhea present. O

## 2021-07-14 NOTE — Patient Instructions (Addendum)
Continue Albuterol inhaler 2 puffs every 6 hours as needed for shortness of breath or wheezing. Notify if symptoms persist despite rescue inhaler/neb use. ?Continue Zyrtec 10 mg daily ?Continue flonase nasal spray 1-2 sprays each nostril as needed for allergies/nasal congestion ? ?Stop Breo. Trial Trelegy 1 puff daily. Brush tongue and rinse mouth afterwards  ? ?Follow up in 6 weeks with Dr. Chase Caller or Alanson Aly. If symptoms do not improve or worsen, please contact office for sooner follow up or seek emergency care. ?

## 2021-07-17 DIAGNOSIS — N3281 Overactive bladder: Secondary | ICD-10-CM | POA: Diagnosis not present

## 2021-07-17 DIAGNOSIS — R3121 Asymptomatic microscopic hematuria: Secondary | ICD-10-CM | POA: Diagnosis not present

## 2021-07-20 ENCOUNTER — Telehealth: Payer: Self-pay | Admitting: Nurse Practitioner

## 2021-07-20 NOTE — Telephone Encounter (Signed)
Pt saw [facility] 4/14- was switched from [facility] to Trelegy. Pt states she doesn't think it's helping- has stopped taking Trelegy after 2/3 days. Pt unsure if she's supposed to continue Trelegy and albuterol. Pt stopped the Trelegy because she was using albuterol. Please advise [phone] ?

## 2021-07-20 NOTE — Telephone Encounter (Signed)
Called and spoke with pt about her inhalers and have cleared up all confusion that she had. Also scheduled pt a 6 week follow up with [facility] which was per last appt. Nothing further needed. ?

## 2021-07-31 ENCOUNTER — Telehealth: Payer: Self-pay | Admitting: Internal Medicine

## 2021-08-01 NOTE — Telephone Encounter (Signed)
Called patient but she did not answer. Left message for her to call us back.  

## 2021-08-02 ENCOUNTER — Telehealth: Payer: Self-pay | Admitting: Nurse Practitioner

## 2021-08-02 NOTE — Telephone Encounter (Signed)
ATC patient, LMTCB 

## 2021-08-03 NOTE — Telephone Encounter (Signed)
Spoke to patient. ?She stated that she was recently switched from [facility] to Trelegy. She is concerned about trelegy side effects after researching. She would like to switch back to [place]. ? ?Anne [name], please advise. Thanks ?

## 2021-08-04 NOTE — Telephone Encounter (Signed)
We switched her to Trelegy because she has had progressive worsening of her DOE over the past 6 months. Trelegy has the same exact medicines that [place] [facility] has, plus an additional, so would not be any less effective. If she doesn't see any improvement and wants to go back on [place], that is fine. Thanks.

## 2021-08-04 NOTE — Telephone Encounter (Signed)
Spoke to patient.  ?She stated that she could not recall the side effects that she is concerned about, as she heard it on T.V and she caught the end of the conversation. She also stated that she was not having any issues with Breo and she feels that Trelegy is not as effective as Breo. ?She would like also to know why she was switched to Trelegy. ? ? ?Joellen [place], please advise. Thanks ?

## 2021-08-04 NOTE — Telephone Encounter (Signed)
Spoke to patient and relayed below message.  ?She would like to continue Breo. She has a Rx ready at the pharmacy.  ?She would like to discuss this with Dr. Chase Caller at next [place]. ?Nothing further needed.  ? ? ?Routing to MR as an FYI ?

## 2021-08-07 DIAGNOSIS — I129 Hypertensive chronic kidney disease with stage 1 through stage 4 chronic kidney disease, or unspecified chronic kidney disease: Secondary | ICD-10-CM | POA: Diagnosis not present

## 2021-08-07 DIAGNOSIS — L989 Disorder of the skin and subcutaneous tissue, unspecified: Secondary | ICD-10-CM | POA: Diagnosis not present

## 2021-08-07 DIAGNOSIS — E7800 Pure hypercholesterolemia, unspecified: Secondary | ICD-10-CM | POA: Diagnosis not present

## 2021-08-07 DIAGNOSIS — I700 Atherosclerosis of aorta: Secondary | ICD-10-CM | POA: Diagnosis not present

## 2021-08-07 DIAGNOSIS — N1831 Chronic kidney disease, stage 3a: Secondary | ICD-10-CM | POA: Diagnosis not present

## 2021-08-07 NOTE — Telephone Encounter (Signed)
Ok that is fine ? ?Thanks ? ? ? ?SIGNATURE  ? ? ?Dr. Brand Males, M.D., F.C.C.P,  ?Pulmonary and Critical Care Medicine ?Staff Physician, [place] ?Center Director - Interstitial Lung Disease  Program  ?Medical Director - [facility] ?Pulmonary [place] [facility] ?[place], [place], [address] ? ?NPI Number:  NPI #[id number] ?DEA Number: [id number] ? ?Pager: [phone], If no answer  -> Check AMION or Try [phone] ?Telephone (clinical office): [phone] ?Telephone (research): [phone] ? ?2:07 PM ?08/07/2021 ? ?

## 2021-08-14 ENCOUNTER — Other Ambulatory Visit: Payer: Self-pay | Admitting: Cardiology

## 2021-08-18 DIAGNOSIS — H401112 Primary open-angle glaucoma, right eye, moderate stage: Secondary | ICD-10-CM | POA: Diagnosis not present

## 2021-08-18 DIAGNOSIS — H401121 Primary open-angle glaucoma, left eye, mild stage: Secondary | ICD-10-CM | POA: Diagnosis not present

## 2021-08-25 ENCOUNTER — Ambulatory Visit: Payer: Medicare PPO | Admitting: Nurse Practitioner

## 2021-09-01 ENCOUNTER — Telehealth: Payer: Self-pay | Admitting: Neurology

## 2021-09-01 NOTE — Telephone Encounter (Signed)
Patient said she isnt walking well. She doesn't think she needs to wait till sept to be seen. Maybe patel needs to increase her meds, but she really doesn't want to up it. She is concerned her feet/ankles are stiff, they feel different.

## 2021-09-01 NOTE — Telephone Encounter (Signed)
Thanks. Seeing her 09/05/21 an dwill address. Reply no tneeded

## 2021-09-01 NOTE — Telephone Encounter (Signed)
Called Pt message was left per Dr. Delice Lesch advice. Told her to call back if any questions

## 2021-09-01 NOTE — Telephone Encounter (Signed)
Could either send her for physical therapy or increase the gabapentin, thanks

## 2021-09-04 NOTE — Telephone Encounter (Signed)
Has she been off Gabapentin? If so, she wants to restart Gabapentin '300mg'$  qhs as Dr. Posey Pronto prescribed in Sept?

## 2021-09-04 NOTE — Telephone Encounter (Signed)
Pls let her know that there is no medication for balance. If balance is worsening, we refer again for physical therapy to do balance therapy. If there is pain or burning, then we can increase the gabapentin '300mg'$ : Take 2 caps at bedtime. If she is agreeable, pls send order, thanks

## 2021-09-05 ENCOUNTER — Encounter: Payer: Self-pay | Admitting: Internal Medicine

## 2021-09-05 ENCOUNTER — Ambulatory Visit: Payer: Medicare PPO | Admitting: Internal Medicine

## 2021-09-05 VITALS — BP 122/68 | HR 55 | Ht 67.0 in | Wt 156.6 lb

## 2021-09-05 DIAGNOSIS — Z8679 Personal history of other diseases of the circulatory system: Secondary | ICD-10-CM | POA: Diagnosis not present

## 2021-09-05 DIAGNOSIS — Z9109 Other allergy status, other than to drugs and biological substances: Secondary | ICD-10-CM

## 2021-09-05 DIAGNOSIS — R0609 Other forms of dyspnea: Secondary | ICD-10-CM | POA: Diagnosis not present

## 2021-09-05 DIAGNOSIS — J4530 Mild persistent asthma, uncomplicated: Secondary | ICD-10-CM | POA: Diagnosis not present

## 2021-09-05 MED ORDER — GABAPENTIN 300 MG PO CAPS: ORAL_CAPSULE | ORAL | 3 refills | Status: DC

## 2021-09-05 NOTE — Telephone Encounter (Signed)
Rx sent to [facility] on file, thanks

## 2021-09-05 NOTE — Patient Instructions (Addendum)
ICD-10-CM   1. Mild persistent asthma without complication  S25.83     2. Environmental allergies  Z91.09     3. History of diastolic dysfunction  M62.19     4. DOE (dyspnea on exertion)  R06.09      Shortness of breath overall stable Trelegy did not help better than breo   Plan - Continue Breo daily and albuterol as needed -Continue Zyrtec as before -Continue other medications as before - Monitor symptoms  - if frequent flare ups or worsening shortness of breath can consider rechallenge trelegy or injection xolair  Follow-up - Visit Dr Chase Caller - 4 months or sooner if needed

## 2021-09-05 NOTE — Progress Notes (Signed)
She is social distancing, masking and has had a vaccines.  She continues to be active.  ACT score is 25 and shows good asthma control.    Asthma Control Test ACT Total Score  05/06/2020 25       03/09/2021 Follow up : Asthma  Patient complains of 3 days of increased cough and congestion. Mucus is thick and hard to get up but remains clear. Remains active. Drives and lives alone at home . Has some mild sinus drainage  Remains on BREO daily . No increased Albuterol inhaler use. No wheezing .  No fever,edema , abd pain , n/v/d. Does have some epigastric burning from time to time. No dysphagia.   Recent cardiac workup last month . Echo 02/20/21 showed EF 60%, Grade 1 DD ,Normal PAP  Chest xray nad Coronary CT with no lung issues , felt CAD stable -no significant stenosis per cards note.  Placed on PPI from PCP but has not started. Cardiac monitor to be placed.  TEST/EVENTS :  Exhaled nitric oxide test May 2018: 17 ppb High resolution CT chest August 2016 showed no evidence of interstitial lung disease, chronic collapse of right middle lobe MEdiastinaoscopy 07/27/2009 - Benign node of 4R and 4L. ANthracotoic pigment +. No granuoma. Non malignant PFT 10/28/2014 FEV1  94, ratio 54, FVC 135, no significant bronchodilator response, mid flows reversibility, DLCO 65%  OV 06/16/2021  Subjective:  Patient ID: Anne [name], female , DOB: 02-Aug-1930 , age 88 y.o. , MRN: [id number] , ADDRESS: [address] [place] [address] PCP [name], MD Patient Care Team: [name], MD as PCP - General (Internal Medicine) Belva Crome, MD as PCP - Cardiology (Cardiology) Daryll Brod, MD as Consulting Physician (Orthopedic Surgery) [name], DO as Consulting Physician (Neurology)  This Provider for this visit: Treatment Team:  Attending Provider: Clayton Bibles, NP    06/16/2021 -   Chief Complaint  Patient presents with   Follow-up    Pt states she is still having complaints of SOB. Has an occasional cough mainly at night but not of any concern. Denies any complaints of wheezing.   Female never smoker followed for mild persistent asthma  History of diastolic dysfunction  Known chronic right middle lobe collapse/ lymphadenopathy -s/p 07/2007 FOB +psuedomonas - MEdiastinaoscopy 07/27/2009 - Benign node of 4R and 4L. ANthracotoic pigment +. No granuoma. Non malignant  - last cxr 2019 and clear  HPI Anne [name] 88 y.o. -presents for an acute visit.  I personally saw her in February 2022 for her mild asthma and she was stable.  After that she saw a nurse practitioner Patricia Nettle in December 2022 for congestion over 3 days and was given 3 days prednisone.  Meanwhile she is continued her Breo.  However she tells me that ever since Thanksgiving 2022 she has been dealing with congestion and chest that she describes it as a burning and a pain.  Its not present daily.  It is present intermittently on and off.  Definitely she feels different.  Yet at the same time she states she has no wheezing or cough.  She does have some paroxysmal nocturnal dyspnea.  The most recent one was last night but she is unable to quantify how severe this is.   She also admits to acid reflux but then she says this is on and off.  She is not able to tell me if this is worse than before.  She also reports shortness of  HPI Chief Complaint  Patient presents with   Acute Visit    cough     Referring provider: [name], MD  HPI: 88 year old female never smoker followed for mild persistent asthma History of diastolic dysfunction Known chronic right middle lobe collapse/ lymphadenopathy -s/p 07/2007 FOB +psuedomonas   Test Exhaled nitric oxide test May 2018: 17 ppb High resolution CT chest August 2016 showed no evidence of interstitial lung disease, chronic collapse of right middle lobe MEdiastinaoscopy 07/27/2009 - Benign node of 4R and 4L. ANthracotoic pigment +. No granuoma. Non malignant PFT 10/28/2014 FEV1 94, ratio 54, FVC 135, no significant bronchodilator response, mid flows reversibility, DLCO 65%    04/25/2017 Acute OV : Asthma  Patient presents for an acute office visit.  She complains of 3 weeks of cough ,congestion sinus congestion, drainage , wheezing . Seen PCP dx w/ bronchitis , rx  , Doxycycline and Prednisone . Feels some better but still has chest congestion and tightness .  She remains on Breo daily.    Appetite is okay w/ no nv/d.  No chest pain, orthopnea or ede   OV 06/05/2017  Chief Complaint  Patient presents with   Follow-up    Pt states she has been doing good since last visit. Pt states she does become SOB with exertion. Denies any cough or CP.    Fu Mild persiostent asthma with associated diastolic dysfunction  Contines on broe. Overall stable. But last  4 month has has had 2 surgeries for hammer toe on left food. So less active. Correlating with this more dyspnea on exertion than usual. Also, found to have mold in the bathroom and professionally removed . Has not affected her but she is asking for feno and walk test both of which aare normal. She wants sample breo       OV 12/17/2017  Subjective:  Patient ID: Anne [name], female , DOB: 1931-03-06 , age 3 y.o. , MRN: [id number] , ADDRESS: [address] [place] [address]   12/17/2017 -    Chief Complaint  Patient presents with   Follow-up    Pt states she has been doing well since last visit and denies any complaints.   Fu Mild persiostent asthma with associated diastolic dysfunction  HPI Anne [name] 88 y.o. -  Mild persistent asthma follow-up. Recently she was switched to Brio on Qvar. This is helped his symptoms. In addition she is in the mold in the house.She feels great. She is somewhat she has done well. No interim asthma exacerbations. She will have a high dose flu shot today. There are really no other issues other than some mild recurrence of costochondral chest pain in the last few days is a recurrent problem for her.     Results for Anne [name], Anne [name] (MRN [id number]) as of 06/05/2017 14:14  Ref. Range 08/07/2016 12:01 02/12/2017 11:30 06/05/2017 13:04  Nitric Oxide Unknown '17 18 20   '$ OV 01/15/2019  Subjective:  Patient ID: Anne [name], female , DOB: [birth year] , age 29 y.o. , MRN: [id number] , ADDRESS: [place] [address]   01/15/2019 -   Chief Complaint  Patient presents with   Follow-up    Pt states her breathing is doing well. Pt denies any increase in SOB, cough, CP/tightness, f/c/s, body aches.    88 year old female never smoker followed for mild persistent asthma History of diastolic dysfunction Known chronic right middle lobe collapse/ lymphadenopathy -s/p 07/2007 FOB +psuedomonas -  She is social distancing, masking and has had a vaccines.  She continues to be active.  ACT score is 25 and shows good asthma control.    Asthma Control Test ACT Total Score  05/06/2020 25       03/09/2021 Follow up : Asthma  Patient complains of 3 days of increased cough and congestion. Mucus is thick and hard to get up but remains clear. Remains active. Drives and lives alone at home . Has some mild sinus drainage  Remains on BREO daily . No increased Albuterol inhaler use. No wheezing .  No fever,edema , abd pain , n/v/d. Does have some epigastric burning from time to time. No dysphagia.   Recent cardiac workup last month . Echo 02/20/21 showed EF 60%, Grade 1 DD ,Normal PAP  Chest xray nad Coronary CT with no lung issues , felt CAD stable -no significant stenosis per cards note.  Placed on PPI from PCP but has not started. Cardiac monitor to be placed.  TEST/EVENTS :  Exhaled nitric oxide test May 2018: 17 ppb High resolution CT chest August 2016 showed no evidence of interstitial lung disease, chronic collapse of right middle lobe MEdiastinaoscopy 07/27/2009 - Benign node of 4R and 4L. ANthracotoic pigment +. No granuoma. Non malignant PFT 10/28/2014 FEV1  94, ratio 54, FVC 135, no significant bronchodilator response, mid flows reversibility, DLCO 65%  OV 06/16/2021  Subjective:  Patient ID: Anne [name], female , DOB: 02-Aug-1930 , age 88 y.o. , MRN: [id number] , ADDRESS: [address] [place] [address] PCP [name], MD Patient Care Team: [name], MD as PCP - General (Internal Medicine) Belva Crome, MD as PCP - Cardiology (Cardiology) Daryll Brod, MD as Consulting Physician (Orthopedic Surgery) [name], DO as Consulting Physician (Neurology)  This Provider for this visit: Treatment Team:  Attending Provider: Clayton Bibles, NP    06/16/2021 -   Chief Complaint  Patient presents with   Follow-up    Pt states she is still having complaints of SOB. Has an occasional cough mainly at night but not of any concern. Denies any complaints of wheezing.   Female never smoker followed for mild persistent asthma  History of diastolic dysfunction  Known chronic right middle lobe collapse/ lymphadenopathy -s/p 07/2007 FOB +psuedomonas - MEdiastinaoscopy 07/27/2009 - Benign node of 4R and 4L. ANthracotoic pigment +. No granuoma. Non malignant  - last cxr 2019 and clear  HPI Anne [name] 88 y.o. -presents for an acute visit.  I personally saw her in February 2022 for her mild asthma and she was stable.  After that she saw a nurse practitioner Patricia Nettle in December 2022 for congestion over 3 days and was given 3 days prednisone.  Meanwhile she is continued her Breo.  However she tells me that ever since Thanksgiving 2022 she has been dealing with congestion and chest that she describes it as a burning and a pain.  Its not present daily.  It is present intermittently on and off.  Definitely she feels different.  Yet at the same time she states she has no wheezing or cough.  She does have some paroxysmal nocturnal dyspnea.  The most recent one was last night but she is unable to quantify how severe this is.   She also admits to acid reflux but then she says this is on and off.  She is not able to tell me if this is worse than before.  She also reports shortness of  She is social distancing, masking and has had a vaccines.  She continues to be active.  ACT score is 25 and shows good asthma control.    Asthma Control Test ACT Total Score  05/06/2020 25       03/09/2021 Follow up : Asthma  Patient complains of 3 days of increased cough and congestion. Mucus is thick and hard to get up but remains clear. Remains active. Drives and lives alone at home . Has some mild sinus drainage  Remains on BREO daily . No increased Albuterol inhaler use. No wheezing .  No fever,edema , abd pain , n/v/d. Does have some epigastric burning from time to time. No dysphagia.   Recent cardiac workup last month . Echo 02/20/21 showed EF 60%, Grade 1 DD ,Normal PAP  Chest xray nad Coronary CT with no lung issues , felt CAD stable -no significant stenosis per cards note.  Placed on PPI from PCP but has not started. Cardiac monitor to be placed.  TEST/EVENTS :  Exhaled nitric oxide test May 2018: 17 ppb High resolution CT chest August 2016 showed no evidence of interstitial lung disease, chronic collapse of right middle lobe MEdiastinaoscopy 07/27/2009 - Benign node of 4R and 4L. ANthracotoic pigment +. No granuoma. Non malignant PFT 10/28/2014 FEV1  94, ratio 54, FVC 135, no significant bronchodilator response, mid flows reversibility, DLCO 65%  OV 06/16/2021  Subjective:  Patient ID: Anne [name], female , DOB: 02-Aug-1930 , age 88 y.o. , MRN: [id number] , ADDRESS: [address] [place] [address] PCP [name], MD Patient Care Team: [name], MD as PCP - General (Internal Medicine) Belva Crome, MD as PCP - Cardiology (Cardiology) Daryll Brod, MD as Consulting Physician (Orthopedic Surgery) [name], DO as Consulting Physician (Neurology)  This Provider for this visit: Treatment Team:  Attending Provider: Clayton Bibles, NP    06/16/2021 -   Chief Complaint  Patient presents with   Follow-up    Pt states she is still having complaints of SOB. Has an occasional cough mainly at night but not of any concern. Denies any complaints of wheezing.   Female never smoker followed for mild persistent asthma  History of diastolic dysfunction  Known chronic right middle lobe collapse/ lymphadenopathy -s/p 07/2007 FOB +psuedomonas - MEdiastinaoscopy 07/27/2009 - Benign node of 4R and 4L. ANthracotoic pigment +. No granuoma. Non malignant  - last cxr 2019 and clear  HPI Anne [name] 88 y.o. -presents for an acute visit.  I personally saw her in February 2022 for her mild asthma and she was stable.  After that she saw a nurse practitioner Patricia Nettle in December 2022 for congestion over 3 days and was given 3 days prednisone.  Meanwhile she is continued her Breo.  However she tells me that ever since Thanksgiving 2022 she has been dealing with congestion and chest that she describes it as a burning and a pain.  Its not present daily.  It is present intermittently on and off.  Definitely she feels different.  Yet at the same time she states she has no wheezing or cough.  She does have some paroxysmal nocturnal dyspnea.  The most recent one was last night but she is unable to quantify how severe this is.   She also admits to acid reflux but then she says this is on and off.  She is not able to tell me if this is worse than before.  She also reports shortness of  MEdiastinaoscopy 07/27/2009 - Benign node of 4R and 4L. ANthracotoic pigment +. No granuoma. Non malignant   HPI Anne [name] 88 y.o. -presents for follow-up.  Last seen September 2019 by myself.  In the interim visit with the nurse practitioner.  She says the [place] works well for her.  She says asthma is really well controlled.  No new symptoms.  She is up-to-date with the flu shot.  She is extremely active.  She is social distancing and isolating quite well and only doing low risk COVID-19 activities.  There are no new complaints or issues.  OV  08/17/2019  Subjective:  Patient ID: Anne [name], female , DOB: 1930-12-11 , age 49 y.o. , MRN: [id number] , ADDRESS: [address] [place] [address]   08/17/2019 -   Chief Complaint  Patient presents with   Follow-up    Pt states she has been doing well since last visit and denies any complaints.   Female never smoker followed for mild persistent asthma  History of diastolic dysfunction  Known chronic right middle lobe collapse/ lymphadenopathy -s/p 07/2007 FOB +psuedomonas - MEdiastinaoscopy 07/27/2009 - Benign node of 4R and 4L. ANthracotoic pigment +. No granuoma. Non malignant  - last cxr 2019 and clear  HPI Anne [name] 88 y.o. -presents for follow-up.  Since last visit she has been doing well.  She has not had a Covid vaccine.  She continues social distancing.  She continues Breo.  She does not want to downsize the inhaler because of Covid.  She feels she needs extra protection.  She wants to maintain her health.  She is glad she has done well so far overall.  Her last chest x-ray was in 2019 she is willing to have another chest x-ray.  In terms of asthma asthma control question is 0 out of 5.  She not waking up in the middle of the night when she wakes up she has no problems she not having any activity limitation because of asthma.  She had wheezing because of asthma she not having any shortness of breath because of asthma she not using albuterol for rescue.     ROS - per HPI  OV 05/06/2020  Subjective:  Patient ID: Anne [name], female , DOB: December 25, 1930 , age 78 y.o. , MRN: [id number] , ADDRESS: [address] [place] [address] PCP [name], MD Patient Care Team: [name], MD as PCP - General (Internal Medicine) Daryll Brod, MD as Consulting Physician (Orthopedic Surgery) [name], DO as Consulting Physician (Neurology)  This Provider for this visit: Treatment Team:  Attending Provider: Brand Males, MD    05/06/2020 -   Chief  Complaint  Patient presents with   Follow-up    No compaints   Female never smoker followed for mild persistent asthma  History of diastolic dysfunction  Known chronic right middle lobe collapse/ lymphadenopathy -s/p 07/2007 FOB +psuedomonas - MEdiastinaoscopy 07/27/2009 - Benign node of 4R and 4L. ANthracotoic pigment +. No granuoma. Non malignant  - last cxr 2019 and clear   HPI Anne [name] 88 y.o. -returns for follow-up for her mild persistent asthma.  She is doing well.  ACT control score is 25.  She was asking about a chest x-ray but I reminded her that in May 2021 she had a chest x-ray that was clear.  I showed her the film.  She was satisfied with that.  There are no new issues she wants refills on her inhalers.  MEdiastinaoscopy 07/27/2009 - Benign node of 4R and 4L. ANthracotoic pigment +. No granuoma. Non malignant   HPI Anne [name] 88 y.o. -presents for follow-up.  Last seen September 2019 by myself.  In the interim visit with the nurse practitioner.  She says the [place] works well for her.  She says asthma is really well controlled.  No new symptoms.  She is up-to-date with the flu shot.  She is extremely active.  She is social distancing and isolating quite well and only doing low risk COVID-19 activities.  There are no new complaints or issues.  OV  08/17/2019  Subjective:  Patient ID: Anne [name], female , DOB: 1930-12-11 , age 49 y.o. , MRN: [id number] , ADDRESS: [address] [place] [address]   08/17/2019 -   Chief Complaint  Patient presents with   Follow-up    Pt states she has been doing well since last visit and denies any complaints.   Female never smoker followed for mild persistent asthma  History of diastolic dysfunction  Known chronic right middle lobe collapse/ lymphadenopathy -s/p 07/2007 FOB +psuedomonas - MEdiastinaoscopy 07/27/2009 - Benign node of 4R and 4L. ANthracotoic pigment +. No granuoma. Non malignant  - last cxr 2019 and clear  HPI Anne [name] 88 y.o. -presents for follow-up.  Since last visit she has been doing well.  She has not had a Covid vaccine.  She continues social distancing.  She continues Breo.  She does not want to downsize the inhaler because of Covid.  She feels she needs extra protection.  She wants to maintain her health.  She is glad she has done well so far overall.  Her last chest x-ray was in 2019 she is willing to have another chest x-ray.  In terms of asthma asthma control question is 0 out of 5.  She not waking up in the middle of the night when she wakes up she has no problems she not having any activity limitation because of asthma.  She had wheezing because of asthma she not having any shortness of breath because of asthma she not using albuterol for rescue.     ROS - per HPI  OV 05/06/2020  Subjective:  Patient ID: Anne [name], female , DOB: December 25, 1930 , age 78 y.o. , MRN: [id number] , ADDRESS: [address] [place] [address] PCP [name], MD Patient Care Team: [name], MD as PCP - General (Internal Medicine) Daryll Brod, MD as Consulting Physician (Orthopedic Surgery) [name], DO as Consulting Physician (Neurology)  This Provider for this visit: Treatment Team:  Attending Provider: Brand Males, MD    05/06/2020 -   Chief  Complaint  Patient presents with   Follow-up    No compaints   Female never smoker followed for mild persistent asthma  History of diastolic dysfunction  Known chronic right middle lobe collapse/ lymphadenopathy -s/p 07/2007 FOB +psuedomonas - MEdiastinaoscopy 07/27/2009 - Benign node of 4R and 4L. ANthracotoic pigment +. No granuoma. Non malignant  - last cxr 2019 and clear   HPI Anne [name] 88 y.o. -returns for follow-up for her mild persistent asthma.  She is doing well.  ACT control score is 25.  She was asking about a chest x-ray but I reminded her that in May 2021 she had a chest x-ray that was clear.  I showed her the film.  She was satisfied with that.  There are no new issues she wants refills on her inhalers.  She is social distancing, masking and has had a vaccines.  She continues to be active.  ACT score is 25 and shows good asthma control.    Asthma Control Test ACT Total Score  05/06/2020 25       03/09/2021 Follow up : Asthma  Patient complains of 3 days of increased cough and congestion. Mucus is thick and hard to get up but remains clear. Remains active. Drives and lives alone at home . Has some mild sinus drainage  Remains on BREO daily . No increased Albuterol inhaler use. No wheezing .  No fever,edema , abd pain , n/v/d. Does have some epigastric burning from time to time. No dysphagia.   Recent cardiac workup last month . Echo 02/20/21 showed EF 60%, Grade 1 DD ,Normal PAP  Chest xray nad Coronary CT with no lung issues , felt CAD stable -no significant stenosis per cards note.  Placed on PPI from PCP but has not started. Cardiac monitor to be placed.  TEST/EVENTS :  Exhaled nitric oxide test May 2018: 17 ppb High resolution CT chest August 2016 showed no evidence of interstitial lung disease, chronic collapse of right middle lobe MEdiastinaoscopy 07/27/2009 - Benign node of 4R and 4L. ANthracotoic pigment +. No granuoma. Non malignant PFT 10/28/2014 FEV1  94, ratio 54, FVC 135, no significant bronchodilator response, mid flows reversibility, DLCO 65%  OV 06/16/2021  Subjective:  Patient ID: Anne [name], female , DOB: 02-Aug-1930 , age 88 y.o. , MRN: [id number] , ADDRESS: [address] [place] [address] PCP [name], MD Patient Care Team: [name], MD as PCP - General (Internal Medicine) Belva Crome, MD as PCP - Cardiology (Cardiology) Daryll Brod, MD as Consulting Physician (Orthopedic Surgery) [name], DO as Consulting Physician (Neurology)  This Provider for this visit: Treatment Team:  Attending Provider: Clayton Bibles, NP    06/16/2021 -   Chief Complaint  Patient presents with   Follow-up    Pt states she is still having complaints of SOB. Has an occasional cough mainly at night but not of any concern. Denies any complaints of wheezing.   Female never smoker followed for mild persistent asthma  History of diastolic dysfunction  Known chronic right middle lobe collapse/ lymphadenopathy -s/p 07/2007 FOB +psuedomonas - MEdiastinaoscopy 07/27/2009 - Benign node of 4R and 4L. ANthracotoic pigment +. No granuoma. Non malignant  - last cxr 2019 and clear  HPI Anne [name] 88 y.o. -presents for an acute visit.  I personally saw her in February 2022 for her mild asthma and she was stable.  After that she saw a nurse practitioner Patricia Nettle in December 2022 for congestion over 3 days and was given 3 days prednisone.  Meanwhile she is continued her Breo.  However she tells me that ever since Thanksgiving 2022 she has been dealing with congestion and chest that she describes it as a burning and a pain.  Its not present daily.  It is present intermittently on and off.  Definitely she feels different.  Yet at the same time she states she has no wheezing or cough.  She does have some paroxysmal nocturnal dyspnea.  The most recent one was last night but she is unable to quantify how severe this is.   She also admits to acid reflux but then she says this is on and off.  She is not able to tell me if this is worse than before.  She also reports shortness of  MEdiastinaoscopy 07/27/2009 - Benign node of 4R and 4L. ANthracotoic pigment +. No granuoma. Non malignant   HPI Anne [name] 88 y.o. -presents for follow-up.  Last seen September 2019 by myself.  In the interim visit with the nurse practitioner.  She says the [place] works well for her.  She says asthma is really well controlled.  No new symptoms.  She is up-to-date with the flu shot.  She is extremely active.  She is social distancing and isolating quite well and only doing low risk COVID-19 activities.  There are no new complaints or issues.  OV  08/17/2019  Subjective:  Patient ID: Anne [name], female , DOB: 1930-12-11 , age 49 y.o. , MRN: [id number] , ADDRESS: [address] [place] [address]   08/17/2019 -   Chief Complaint  Patient presents with   Follow-up    Pt states she has been doing well since last visit and denies any complaints.   Female never smoker followed for mild persistent asthma  History of diastolic dysfunction  Known chronic right middle lobe collapse/ lymphadenopathy -s/p 07/2007 FOB +psuedomonas - MEdiastinaoscopy 07/27/2009 - Benign node of 4R and 4L. ANthracotoic pigment +. No granuoma. Non malignant  - last cxr 2019 and clear  HPI Anne [name] 88 y.o. -presents for follow-up.  Since last visit she has been doing well.  She has not had a Covid vaccine.  She continues social distancing.  She continues Breo.  She does not want to downsize the inhaler because of Covid.  She feels she needs extra protection.  She wants to maintain her health.  She is glad she has done well so far overall.  Her last chest x-ray was in 2019 she is willing to have another chest x-ray.  In terms of asthma asthma control question is 0 out of 5.  She not waking up in the middle of the night when she wakes up she has no problems she not having any activity limitation because of asthma.  She had wheezing because of asthma she not having any shortness of breath because of asthma she not using albuterol for rescue.     ROS - per HPI  OV 05/06/2020  Subjective:  Patient ID: Anne [name], female , DOB: December 25, 1930 , age 78 y.o. , MRN: [id number] , ADDRESS: [address] [place] [id number] PCP [name], MD Patient Care Team: [name], MD as PCP - General (Internal Medicine) Daryll Brod, MD as Consulting Physician (Orthopedic Surgery) [name], DO as Consulting Physician (Neurology)  This Provider for this visit: Treatment Team:  Attending Provider: Brand Males, MD    05/06/2020 -   Chief  Complaint  Patient presents with   Follow-up    No compaints   Female never smoker followed for mild persistent asthma  History of diastolic dysfunction  Known chronic right middle lobe collapse/ lymphadenopathy -s/p 07/2007 FOB +psuedomonas - MEdiastinaoscopy 07/27/2009 - Benign node of 4R and 4L. ANthracotoic pigment +. No granuoma. Non malignant  - last cxr 2019 and clear   HPI Anne [name] 88 y.o. -returns for follow-up for her mild persistent asthma.  She is doing well.  ACT control score is 25.  She was asking about a chest x-ray but I reminded her that in May 2021 she had a chest x-ray that was clear.  I showed her the film.  She was satisfied with that.  There are no new issues she wants refills on her inhalers.

## 2021-09-05 NOTE — Telephone Encounter (Signed)
Pt called and informed that prescription was sent to [facility]

## 2021-09-11 ENCOUNTER — Telehealth: Payer: Self-pay | Admitting: Neurology

## 2021-09-11 NOTE — Telephone Encounter (Signed)
Called patient and left voicemail message.

## 2021-09-11 NOTE — Telephone Encounter (Signed)
Pt called in stating she took the larger dose of gabapentin and it's been too much for her to handle. It's been affecting her eyes and balance. She has decided she might want to try physical therapy instead of increasing the gabapentin.

## 2021-09-12 ENCOUNTER — Other Ambulatory Visit: Payer: Self-pay

## 2021-09-12 DIAGNOSIS — G629 Polyneuropathy, unspecified: Secondary | ICD-10-CM

## 2021-09-12 NOTE — Telephone Encounter (Signed)
Called patient back and she would like to go to [facility] Referral has been sent

## 2021-09-18 DIAGNOSIS — L821 Other seborrheic keratosis: Secondary | ICD-10-CM | POA: Diagnosis not present

## 2021-09-21 DIAGNOSIS — R2689 Other abnormalities of gait and mobility: Secondary | ICD-10-CM | POA: Diagnosis not present

## 2021-09-21 DIAGNOSIS — M6281 Muscle weakness (generalized): Secondary | ICD-10-CM | POA: Diagnosis not present

## 2021-09-27 DIAGNOSIS — M6281 Muscle weakness (generalized): Secondary | ICD-10-CM | POA: Diagnosis not present

## 2021-09-27 DIAGNOSIS — R2689 Other abnormalities of gait and mobility: Secondary | ICD-10-CM | POA: Diagnosis not present

## 2021-09-27 NOTE — Telephone Encounter (Signed)
Pt was seen by MR 6/6 and questions about medications were handled during that OV.

## 2021-10-11 DIAGNOSIS — M6281 Muscle weakness (generalized): Secondary | ICD-10-CM | POA: Diagnosis not present

## 2021-10-11 DIAGNOSIS — R2689 Other abnormalities of gait and mobility: Secondary | ICD-10-CM | POA: Diagnosis not present

## 2021-10-18 DIAGNOSIS — M6281 Muscle weakness (generalized): Secondary | ICD-10-CM | POA: Diagnosis not present

## 2021-10-18 DIAGNOSIS — R2689 Other abnormalities of gait and mobility: Secondary | ICD-10-CM | POA: Diagnosis not present

## 2021-11-01 DIAGNOSIS — M6281 Muscle weakness (generalized): Secondary | ICD-10-CM | POA: Diagnosis not present

## 2021-11-01 DIAGNOSIS — R2689 Other abnormalities of gait and mobility: Secondary | ICD-10-CM | POA: Diagnosis not present

## 2021-11-08 DIAGNOSIS — M6281 Muscle weakness (generalized): Secondary | ICD-10-CM | POA: Diagnosis not present

## 2021-11-08 DIAGNOSIS — R2689 Other abnormalities of gait and mobility: Secondary | ICD-10-CM | POA: Diagnosis not present

## 2021-11-16 DIAGNOSIS — R2689 Other abnormalities of gait and mobility: Secondary | ICD-10-CM | POA: Diagnosis not present

## 2021-11-16 DIAGNOSIS — M6281 Muscle weakness (generalized): Secondary | ICD-10-CM | POA: Diagnosis not present

## 2021-11-20 ENCOUNTER — Other Ambulatory Visit: Payer: Self-pay | Admitting: Obstetrics and Gynecology

## 2021-11-20 DIAGNOSIS — Z1231 Encounter for screening mammogram for malignant neoplasm of breast: Secondary | ICD-10-CM

## 2021-11-24 DIAGNOSIS — M25551 Pain in right hip: Secondary | ICD-10-CM | POA: Diagnosis not present

## 2021-11-24 DIAGNOSIS — M4126 Other idiopathic scoliosis, lumbar region: Secondary | ICD-10-CM | POA: Diagnosis not present

## 2021-11-27 ENCOUNTER — Ambulatory Visit: Payer: Medicare PPO | Attending: Interventional Cardiology | Admitting: Interventional Cardiology

## 2021-11-27 ENCOUNTER — Encounter: Payer: Self-pay | Admitting: Interventional Cardiology

## 2021-11-27 VITALS — BP 124/50 | HR 78 | Ht 67.0 in | Wt 156.4 lb

## 2021-11-27 DIAGNOSIS — I10 Essential (primary) hypertension: Secondary | ICD-10-CM

## 2021-11-27 DIAGNOSIS — R002 Palpitations: Secondary | ICD-10-CM

## 2021-11-27 DIAGNOSIS — R931 Abnormal findings on diagnostic imaging of heart and coronary circulation: Secondary | ICD-10-CM

## 2021-11-27 DIAGNOSIS — I25118 Atherosclerotic heart disease of native coronary artery with other forms of angina pectoris: Secondary | ICD-10-CM

## 2021-11-27 NOTE — Progress Notes (Signed)
Cardiology Office Note:    Date:  11/27/2021   ID:  Anne [name], DOB February 15, 1931, MRN [id number]  PCP:  Merlene Laughter, MD  Cardiologist:  Lesleigh Noe, MD   Referring MD: Merlene Laughter, MD   Chief Complaint  Patient presents with   Coronary Artery Disease   Hypertension    History of Present Illness:    Anne [name] is a 88 y.o. female with a hx of anxiety disorder, hypertension, nonobstructive CAD, recurring chest pain (microvascular angina suspected), and history of asthma.  She has atypical chest pain that is fleeting, lasting seconds before going away.  There is no exertional component.  We took the time today to discuss her nonobstructive coronary disease in the context of her current medical regimen.  She is being treated for hyperlipidemia and hypertension.  Those medications need to be continued along with a baby aspirin per day.  Past Medical History:  Diagnosis Date   Allergic rhinitis    Arthritis    Asthma    Atypical chest pain    Collapse of right lung    Dyslipidemia    Gastroesophageal reflux disease    Glaucoma    Hematuria    Hyperlipidemia    Lung nodules    PVC's (premature ventricular contractions) 02/21/2021   Sinus bradycardia 02/21/2021    Past Surgical History:  Procedure Laterality Date   ANAL FISSURE REPAIR     BREAST BIOPSY Right    CATARACT EXTRACTION Bilateral    MASS EXCISION  04/11/2012   Procedure: EXCISION MASS;  Surgeon: Nicki Reaper, MD;  Location: [facility];  Service: Orthopedics;  Laterality: Right;  Excision of mass right upper arm   MEDIASTINOSCOPY  4/11   NASAL SINUS SURGERY     TOTAL ABDOMINAL HYSTERECTOMY W/ BILATERAL SALPINGOOPHORECTOMY      Current Medications: Current Meds  Medication Sig   albuterol (PROAIR HFA) 108 (90 Base) MCG/ACT inhaler INHALE 1-2 PUFFS INTO THE LUNGS EVERY 6 (SIX) HOURS AS NEEDED FOR WHEEZING OR SHORTNESS OF BREATH.   amLODipine (NORVASC) 5 MG tablet Take  1 tablet by mouth daily.   aspirin EC 81 MG EC tablet Take 1 tablet (81 mg total) by mouth daily. Swallow whole.   BREO ELLIPTA 100-25 MCG/INH AEPB INHALE 1 PUFF BY MOUTH EVERY DAY   Calcium Carb-Cholecalciferol 600-200 MG-UNIT TABS Take 1 tablet by mouth daily.   cetirizine (ZYRTEC) 10 MG tablet Take 10 mg by mouth every morning.   ferrous sulfate 325 (65 FE) MG tablet Take 325 mg by mouth 3 (three) times a week. Take on MWF   fluticasone (FLONASE) 50 MCG/ACT nasal spray Place 1 spray into both nostrils 2 (two) times daily as needed for allergies.    gabapentin (NEURONTIN) 300 MG capsule Take 2 capsules at night   latanoprost (XALATAN) 0.005 % ophthalmic solution Place 1 drop into both eyes at bedtime.   metoprolol succinate (TOPROL XL) 25 MG 24 hr tablet Take 1 tablet (25 mg total) by mouth daily.   Multiple Vitamin (MULTIVITAMIN) tablet Take 1 tablet by mouth every morning.    nitroGLYCERIN (NITROSTAT) 0.4 MG SL tablet Place 1 tablet (0.4 mg total) under the tongue every 5 (five) minutes as needed for chest pain (Up to 3 doses in 15 minutes.).   rosuvastatin (CRESTOR) 20 MG tablet TAKE 1 TABLET BY MOUTH EVERY DAY   TURMERIC PO Take 1 capsule by mouth daily.     Allergies:   Other,  Shellfish allergy, Iodine, Latex, Pravastatin, and Adhesive [tape]   Social History   Socioeconomic History   Marital status: Widowed    Spouse name: Not on file   Number of children: Not on file   Years of education: Not on file   Highest education level: Not on file  Occupational History   Occupation: retired  Tobacco Use   Smoking status: Former    Years: 4.00    Types: Cigarettes    Start date: February 25, 1952    Quit date: 1960    Years since quitting: 63.6   Smokeless tobacco: Never   Tobacco comments:    social smoker  Building services engineer Use: Never used  Substance and Sexual Activity   Alcohol use: No    Alcohol/week: 0.0 standard drinks of alcohol   Drug use: No   Sexual activity: Never     Birth control/protection: Surgical  Other Topics Concern   Not on file  Social History Narrative   Retired Public relations account executive.  Lives alone, husband passed away in 02-24-2017.  No children.   Right handed   One level home   Social Determinants of Health   Financial Resource Strain: Not on file  Food Insecurity: Not on file  Transportation Needs: Not on file  Physical Activity: Not on file  Stress: Not on file  Social Connections: Not on file     Family History: The patient's family history includes Breast cancer in her cousin; Cancer in her mother; Heart disease in her father. There is no history of Heart attack.  ROS:   Please see the history of present illness.    Walking and having no symptoms with physical activity.  [facility]  All other systems reviewed and are negative.  EKGs/Labs/Other Studies Reviewed:    The following studies were reviewed today:  Coronary CTA 02-24-2021: IMPRESSION: 1. Moderate CAD, CADRADS = 3. CT FFR will be performed and reported separately.   2. Coronary calcium score of 615. She is out of the age range for percentile comparison   3. Normal coronary origin with right dominance.   4.  Aortic atherosclerosis.  CT FFR 02-24-21:   1. Left Main:  No significant stenosis. FFR = 0.97   2. LAD: No significant stenosis. Proximal FFR = 0.95, Mid FFR = 0.93, Distal FFR = 0.83 3. LCX: No significant stenosis. Proximal FFR = 0.92, Distal FFR = 0.86 4. RCA: No significant stenosis. Proximal FFR = 0.96, Mid FFR = 0.89, Distal FFR = 0.87   IMPRESSION: 1.  CT FFR analysis did not show any significant stenosis.  Nuclear perfusion imaging March 2023: Study Highlights      The study is normal. The study is low risk.   No ST deviation was noted.   LV perfusion is normal. There is no evidence of ischemia. There is no evidence of infarction.   Left ventricular function is normal. Nuclear stress EF: 74 %. The left ventricular ejection  fraction is hyperdynamic (>65%). End diastolic cavity size is normal. End systolic cavity size is normal.   Prior study available for comparison from 11/26/2014.   Fixed perfusion defect in apical inferior wall and apex with normal wall motion consistent with artifact Low risk study   EKG:  EKG not performed  Recent Labs: 02/20/2021: Magnesium 2.0; TSH 2.370 05/06/2021: ALT 23; B Natriuretic Peptide 240.6 06/16/2021: BUN 23; Creatinine, Ser 1.16; Hemoglobin 12.1; Platelets 281.0; Potassium 4.4; Pro B Natriuretic peptide (BNP)  221.0; Sodium 140  Recent Lipid Panel No results found for: "CHOL", "TRIG", "HDL", "CHOLHDL", "VLDL", "LDLCALC", "LDLDIRECT"  Physical Exam:    VS:  BP (!) 124/50   Pulse 78   Ht 5\' 7"  (1.702 m)   Wt 156 lb 6.4 oz (70.9 kg)   SpO2 98%   BMI 24.50 kg/m     Wt Readings from Last 3 Encounters:  11/27/21 156 lb 6.4 oz (70.9 kg)  09/05/21 156 lb 9.6 oz (71 kg)  07/14/21 156 lb (70.8 kg)     GEN: Appears to be sturdy and younger than stated age.. No acute distress HEENT: Normal NECK: No JVD. LYMPHATICS: No lymphadenopathy CARDIAC: No murmur. RRR S4 but no S3 gallop, or edema. VASCULAR:  Normal Pulses. No bruits. RESPIRATORY:  Clear to auscultation without rales, wheezing or rhonchi  ABDOMEN: Soft, non-tender, non-distended, No pulsatile mass, MUSCULOSKELETAL: No deformity  SKIN: Warm and dry NEUROLOGIC:  Alert and oriented x 3 PSYCHIATRIC:  Normal affect   ASSESSMENT:    1. Coronary artery disease of native artery of native heart with stable angina pectoris (HCC)   2. Systolic hypertension   3. Abnormal findings diagnostic imaging of heart and coronary circulation   4. Palpitations    PLAN:    In order of problems listed above:  Continue baby aspirin and statin therapy.  We will recheck lipids.  She has significant diffuse atherosclerosis and continuing statin therapy is a must.\ Target blood pressure 140/80 10 mmHg.  Continue amlodipine and  Toprol-XL. We discussed the findings of her recent cardiac evaluation.  She has nonobstructive coronary disease, relatively diffuse. Not a current issue.  Overall education and awareness concerning primary/secondary risk prevention was discussed in detail: LDL less than 70, hemoglobin A1c less than 7, blood pressure target less than 130/80 mmHg, >150 minutes of moderate aerobic activity per week, avoidance of smoking, weight control (via diet and exercise), and continued surveillance/management of/for obstructive sleep apnea.   Fasting liver and lipid panel  Follow-up in 1 year with new provider.  Medication Adjustments/Labs and Tests Ordered: Current medicines are reviewed at length with the patient today.  Concerns regarding medicines are outlined above.  Orders Placed This Encounter  Procedures   Lipid panel   Hepatic function panel   No orders of the defined types were placed in this encounter.   Patient Instructions  Medication Instructions:  Your physician recommends that you continue on your current medications as directed. Please refer to the Current Medication list given to you today.  *If you need a refill on your cardiac medications before your next appointment, please call your pharmacy*  Lab Work: In 1-2 weeks: fasting Lipid panel, Hepatic panel If you have labs (blood work) drawn today and your tests are completely normal, you will receive your results only by: MyChart Message (if you have MyChart) OR A paper copy in the mail If you have any lab test that is abnormal or we need to change your treatment, we will call you to review the results.  Testing/Procedures: NONE  Follow-Up: At [facility], you and your health needs are our priority.  As part of our continuing mission to provide you with exceptional heart care, we have created designated Provider Care Teams.  These Care Teams include your primary Cardiologist (physician) and Advanced Practice  Providers (APPs -  Physician Assistants and Nurse Practitioners) who all work together to provide you with the care you need, when you need it.  Your next appointment:  1 year(s)  The format for your next appointment:   In Person  Provider:   Lesleigh Noe, MD      Important Information About Sugar         Signed, Lesleigh Noe, MD  11/27/2021 2:15 PM    [facility]

## 2021-11-27 NOTE — Patient Instructions (Addendum)
Medication Instructions:  Your physician recommends that you continue on your current medications as directed. Please refer to the Current Medication list given to you today.  *If you need a refill on your cardiac medications before your next appointment, please call your pharmacy*  Lab Work: In 1-2 weeks: fasting Lipid panel, Hepatic panel If you have labs (blood work) drawn today and your tests are completely normal, you will receive your results only by: Katherine (if you have MyChart) OR A paper copy in the mail If you have any lab test that is abnormal or we need to change your treatment, we will call you to review the results.  Testing/Procedures: NONE  Follow-Up: At [facility], you and your health needs are our priority.  As part of our continuing mission to provide you with exceptional heart care, we have created designated Provider Care Teams.  These Care Teams include your primary Cardiologist (physician) and Advanced Practice Providers (APPs -  Physician Assistants and Nurse Practitioners) who all work together to provide you with the care you need, when you need it.  Your next appointment:   1 year(s)  The format for your next appointment:   In Person  Provider:   Sinclair Grooms, MD      Important Information About Sugar

## 2021-11-29 DIAGNOSIS — R140 Abdominal distension (gaseous): Secondary | ICD-10-CM | POA: Diagnosis not present

## 2021-11-29 DIAGNOSIS — L821 Other seborrheic keratosis: Secondary | ICD-10-CM | POA: Diagnosis not present

## 2021-12-11 ENCOUNTER — Ambulatory Visit: Payer: Medicare PPO | Admitting: Neurology

## 2021-12-12 ENCOUNTER — Ambulatory Visit: Payer: Medicare PPO | Attending: Interventional Cardiology

## 2021-12-12 DIAGNOSIS — I25118 Atherosclerotic heart disease of native coronary artery with other forms of angina pectoris: Secondary | ICD-10-CM

## 2021-12-13 LAB — HEPATIC FUNCTION PANEL
ALT: 17 IU/L (ref 0–32)
AST: 23 IU/L (ref 0–40)
Albumin: 4.3 g/dL (ref 3.7–4.7)
Alkaline Phosphatase: 71 IU/L (ref 44–121)
Bilirubin Total: 0.4 mg/dL (ref 0.0–1.2)
Bilirubin, Direct: 0.11 mg/dL (ref 0.00–0.40)
Total Protein: 6.8 g/dL (ref 6.0–8.5)

## 2021-12-13 LAB — LIPID PANEL
Chol/HDL Ratio: 2.5 ratio (ref 0.0–4.4)
Cholesterol, Total: 154 mg/dL (ref 100–199)
HDL: 61 mg/dL (ref >39)
LDL Chol Calc (NIH): 81 mg/dL (ref 0–99)
Triglycerides: 60 mg/dL (ref 0–149)
VLDL Cholesterol Cal: 12 mg/dL (ref 5–40)

## 2021-12-22 DIAGNOSIS — M4126 Other idiopathic scoliosis, lumbar region: Secondary | ICD-10-CM | POA: Diagnosis not present

## 2021-12-29 ENCOUNTER — Ambulatory Visit: Payer: Medicare PPO

## 2021-12-29 ENCOUNTER — Ambulatory Visit
Admission: RE | Admit: 2021-12-29 | Discharge: 2021-12-29 | Disposition: A | Discharge status: Home / Self Care | Payer: Medicare PPO | Source: Ambulatory Visit | Attending: Obstetrics and Gynecology | Admitting: Obstetrics and Gynecology

## 2021-12-29 DIAGNOSIS — Z1231 Encounter for screening mammogram for malignant neoplasm of breast: Secondary | ICD-10-CM

## 2022-01-01 ENCOUNTER — Encounter: Payer: Self-pay | Admitting: Neurology

## 2022-01-01 ENCOUNTER — Ambulatory Visit: Payer: Medicare PPO | Admitting: Neurology

## 2022-01-01 VITALS — BP 114/59 | HR 77 | Ht 67.0 in | Wt 155.0 lb

## 2022-01-01 DIAGNOSIS — G629 Polyneuropathy, unspecified: Secondary | ICD-10-CM | POA: Diagnosis not present

## 2022-01-01 MED ORDER — GABAPENTIN 300 MG PO CAPS: 300.0000 mg | ORAL_CAPSULE | Freq: Every day | ORAL | 3 refills | Status: DC

## 2022-01-01 NOTE — Patient Instructions (Signed)
Reduce gabapentin '300mg'$  at bedtime  Return to clinic in 1 year

## 2022-01-01 NOTE — Progress Notes (Signed)
Follow-up Visit   Date: 2022/01/28    Anne [name] MRN: [id number] DOB: 06-03-1930   Interim History: Anne [name] is a 88 y.o. right-handed African American female with asthma, GERD, hyperlipidemia returning the clinic for follow-up of bilateral feet pain.  The patient was accompanied to the clinic by self.   IMPRESSION/PLAN: Idiopathic peripheral neuropathy, mild sensory symptoms. - Reduce gabapentin to 300mg  at bedtime - Continue home balance exercises - Patient educated on daily foot inspection, fall prevention, and safety precautions around the home.  - Advised against alternative therapies claiming to cure neuropathy  2. Bilateral carpal tunnel syndrome, mild  - Stable  Return to clinic in 1 year  ---------------------------------------------------------------------- History of present illness: Since early 2017/03/18, she has intermittent stabbing pain over the tip of her left second toe, which occurs sporadically especially at night. Pain is worse with standing or wearing constrictive shoes.  She also complains of dull pain over the medial lower leg and ankle.  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  Unfortunately, she did not have any relief of her foot pain.  She has tried ibuprofen, Aleve, and gabapentin without any relief. MRI left foot which shows nonspecific soft tissue in the second toe with possible postsurgical changes in the flexor digitorum longus tendon. NCS/EMG was normal and did not show evidence of neuropathy or lumbosacral radiculopathy.    She does not have history of diabetes.  She denies any weakness or imbalance and continues to walk unassisted.  She lives alone in a one-level home; her husband passed away in 03/18/2017.   UPDATE 01/28/2022:  She is here for follow-up visit.  She briefly increased gabapentin to 600mg  at bedtime because she felt worsening  imbalance and thought that it would help.  She has not noticed any improvement in balance with medication change.  However, she did appreciate balance therapy to help. She walks unassisted, no falls.  There has been no significant worsening of neuropathy.    Medications:  Current Outpatient Medications on File Prior to Visit  Medication Sig Dispense Refill   albuterol (PROAIR HFA) 108 (90 Base) MCG/ACT inhaler INHALE 1-2 PUFFS INTO THE LUNGS EVERY 6 (SIX) HOURS AS NEEDED FOR WHEEZING OR SHORTNESS OF BREATH. 18 g 3   amLODipine (NORVASC) 5 MG tablet Take 1 tablet by mouth daily.     aspirin EC 81 MG EC tablet Take 1 tablet (81 mg total) by mouth daily. Swallow whole. 30 tablet 11   BREO ELLIPTA 100-25 MCG/INH AEPB INHALE 1 PUFF BY MOUTH EVERY DAY 60 each 3   Calcium Carb-Cholecalciferol 600-200 MG-UNIT TABS Take 1 tablet by mouth daily.     cetirizine (ZYRTEC) 10 MG tablet Take 10 mg by mouth every morning.     ferrous sulfate 325 (65 FE) MG tablet Take 325 mg by mouth 3 (three) times a week. Take on MWF  Take once a week reported on: 28-Jan-2022     fluticasone (FLONASE) 50 MCG/ACT nasal spray Place 1 spray into both nostrils 2 (two) times daily as needed for allergies.      gabapentin (NEURONTIN) 300 MG capsule Take 2 capsules at night 180 capsule 3   latanoprost (XALATAN) 0.005 % ophthalmic solution Place 1 drop into both eyes at bedtime.     metoprolol succinate (TOPROL XL) 25 MG 24 hr tablet Take 1 tablet (25 mg total) by mouth daily. 90  tablet 3   Multiple Vitamin (MULTIVITAMIN) tablet Take 1 tablet by mouth every morning.      nitroGLYCERIN (NITROSTAT) 0.4 MG SL tablet Place 1 tablet (0.4 mg total) under the tongue every 5 (five) minutes as needed for chest pain (Up to 3 doses in 15 minutes.). 25 tablet 4   rosuvastatin (CRESTOR) 20 MG tablet TAKE 1 TABLET BY MOUTH EVERY DAY 90 tablet 2   TURMERIC PO Take 1 capsule by mouth daily.     Current Facility-Administered Medications on File Prior  to Visit  Medication Dose Route Frequency Provider Last Rate Last Admin   regadenoson (LEXISCAN) injection SOLN 0.4 mg  0.4 mg Intravenous Once Quintella Reichert, MD       regadenoson (LEXISCAN) injection SOLN 0.4 mg  0.4 mg Intravenous Once Little Ishikawa, MD       technetium tetrofosmin (TC-MYOVIEW) injection 32.3 millicurie  32.3 millicurie Intravenous Once PRN Little Ishikawa, MD        Allergies:  Allergies  Allergen Reactions   Other Shortness Of Breath    Pt states she has asthma and seafood causes breathing problems.    Shellfish Allergy Shortness Of Breath   Iodine Other (See Comments)    REACTION: " UNSURE TOLD TO LIST AS ALLERGEN BY MD" REACTION: " UNSURE TOLD TO LIST AS ALLERGEN BY MD"   Latex Itching   Pravastatin Other (See Comments)   Adhesive [Tape] Rash     Vital Signs:  BP (!) 114/59   Pulse 77   Ht 5\' 7"  (1.702 m)   Wt 155 lb (70.3 kg)   SpO2 97%   BMI 24.28 kg/m     Neurological Exam: MENTAL STATUS including orientation to time, place, person, recent and remote memory, attention span and concentration, language, and fund of knowledge is normal.  Speech is not dysarthric.  CRANIAL NERVES: Extraocular muscle intact.  No ptosis   MOTOR:  Motor strength is 5/5 in all extremities, including distally.  No atrophy, fasciculations or abnormal movements.  No pronator drift.  Tone is normal.    MSRs:  Reflexes are 2+/4 throughout, except absent bilateral Achilles.  SENSORY:  Vibration is absent at the great toe bilaterally, and intact above the ankles.   COORDINATION/GAIT:  Gait narrow based and stable, unassisted.  Data: NCS/EMG of the left lower extremity 04/11/2017:  This is a normal study of the left lower extremity. In particular, there is no evidence of a sensorimotor polyneuropathy or lumbosacral radiculopathy.   MRI left foot 02/19/2017: 1. Mild nonspecific soft tissue edema in the second toe with possible postsurgical changes in the  flexor digitorum longus tendon. 2. No significant arthropathic changes or acute osseous findings in the second toe. There are mild hammertoe deformities of the digits.  3. Moderate degenerative changes at the first metatarsal phalangeal joint.  Lab Results  Component Value Date   VITAMINB12 1,489 (H) 12/13/2017   Lab Results  Component Value Date   FOLATE >24.1 12/13/2017     Thank you for allowing me to participate in patient's care.  If I can answer any additional questions, I would be pleased to do so.    Sincerely,    Arleen Bar K. Allena Katz, DO

## 2022-01-16 DIAGNOSIS — H04123 Dry eye syndrome of bilateral lacrimal glands: Secondary | ICD-10-CM | POA: Diagnosis not present

## 2022-01-16 DIAGNOSIS — H35033 Hypertensive retinopathy, bilateral: Secondary | ICD-10-CM | POA: Diagnosis not present

## 2022-01-16 DIAGNOSIS — H401112 Primary open-angle glaucoma, right eye, moderate stage: Secondary | ICD-10-CM | POA: Diagnosis not present

## 2022-01-16 DIAGNOSIS — H401121 Primary open-angle glaucoma, left eye, mild stage: Secondary | ICD-10-CM | POA: Diagnosis not present

## 2022-01-16 DIAGNOSIS — H524 Presbyopia: Secondary | ICD-10-CM | POA: Diagnosis not present

## 2022-01-30 DIAGNOSIS — G609 Hereditary and idiopathic neuropathy, unspecified: Secondary | ICD-10-CM | POA: Diagnosis not present

## 2022-01-30 DIAGNOSIS — I700 Atherosclerosis of aorta: Secondary | ICD-10-CM | POA: Diagnosis not present

## 2022-01-30 DIAGNOSIS — M7062 Trochanteric bursitis, left hip: Secondary | ICD-10-CM | POA: Diagnosis not present

## 2022-01-30 DIAGNOSIS — E7800 Pure hypercholesterolemia, unspecified: Secondary | ICD-10-CM | POA: Diagnosis not present

## 2022-01-30 DIAGNOSIS — I129 Hypertensive chronic kidney disease with stage 1 through stage 4 chronic kidney disease, or unspecified chronic kidney disease: Secondary | ICD-10-CM | POA: Diagnosis not present

## 2022-01-30 DIAGNOSIS — N1831 Chronic kidney disease, stage 3a: Secondary | ICD-10-CM | POA: Diagnosis not present

## 2022-01-30 DIAGNOSIS — J4530 Mild persistent asthma, uncomplicated: Secondary | ICD-10-CM | POA: Diagnosis not present

## 2022-02-08 DIAGNOSIS — M545 Low back pain, unspecified: Secondary | ICD-10-CM | POA: Diagnosis not present

## 2022-02-09 ENCOUNTER — Other Ambulatory Visit: Payer: Self-pay | Admitting: Cardiology

## 2022-02-19 DIAGNOSIS — M545 Low back pain, unspecified: Secondary | ICD-10-CM | POA: Diagnosis not present

## 2022-02-19 DIAGNOSIS — M7062 Trochanteric bursitis, left hip: Secondary | ICD-10-CM | POA: Diagnosis not present

## 2022-03-03 ENCOUNTER — Other Ambulatory Visit: Payer: Self-pay | Admitting: Interventional Cardiology

## 2022-03-06 DIAGNOSIS — N3000 Acute cystitis without hematuria: Secondary | ICD-10-CM | POA: Diagnosis not present

## 2022-03-06 DIAGNOSIS — R8271 Bacteriuria: Secondary | ICD-10-CM | POA: Diagnosis not present

## 2022-03-08 ENCOUNTER — Telehealth: Payer: Self-pay | Admitting: *Deleted

## 2022-03-08 ENCOUNTER — Encounter: Payer: Self-pay | Admitting: Nurse Practitioner

## 2022-03-08 ENCOUNTER — Ambulatory Visit: Payer: Medicare PPO | Admitting: Nurse Practitioner

## 2022-03-08 VITALS — BP 126/60 | HR 64 | Temp 98.3°F | Ht 67.0 in | Wt 155.4 lb

## 2022-03-08 DIAGNOSIS — J4531 Mild persistent asthma with (acute) exacerbation: Secondary | ICD-10-CM

## 2022-03-08 DIAGNOSIS — Z91013 Allergy to seafood: Secondary | ICD-10-CM | POA: Diagnosis not present

## 2022-03-08 MED ORDER — METHYLPREDNISOLONE ACETATE 80 MG/ML IJ SUSP
80.0000 mg | Freq: Once | INTRAMUSCULAR | Status: AC
  Administered 2022-03-08 (×2): 80 mg via INTRAMUSCULAR

## 2022-03-08 MED ORDER — EPINEPHRINE 0.3 MG/0.3ML IJ SOAJ: 0.3000 mg | Freq: Once | INTRAMUSCULAR | 1 refills | Status: AC

## 2022-03-08 MED ORDER — PREDNISONE 20 MG PO TABS: 40.0000 mg | ORAL_TABLET | Freq: Every day | ORAL | 0 refills | Status: AC

## 2022-03-08 NOTE — Patient Instructions (Addendum)
Continue Albuterol inhaler 2 puffs or 3 mL neb every 6 hours as needed for shortness of breath or wheezing. Notify if symptoms persist despite rescue inhaler/neb use. Continue Zyrtec 10 mg daily Continue flonase nasal spray 1-2 sprays each nostril as needed for allergies/nasal congestion Continue Breo 1 puff daily. Brush tongue and rinse mouth afterwards    Prednisone 40 mg daily for 5 days. Take in AM with food. Start tomorrow   Asthma action plan: START nebs up to four times a day for worsening shortness of breath, wheezing and cough. If you symptoms do not improve in 24-48 hours, contact us for steroid course. If your symptoms rapidly worsen, you have trouble talking or extreme difficulty breathing, or you're not getting relief from your nebulizer/rescue, go to the emergency department.   Avoid all shellfish in the future    Follow up in 2 weeks with Dr. Chase Caller or Alanson Aly. If symptoms do not improve or worsen, please contact office for sooner follow up or seek emergency care.

## 2022-03-08 NOTE — Telephone Encounter (Signed)
ATC patient x1.  Left detailed message that Joellen Jersey called in an Epipen to her pharmacy to be used in case of an emergency in the event she ingests shellfish.

## 2022-03-08 NOTE — Assessment & Plan Note (Signed)
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 She has experienced this in the past as well. Advised to avoid all shellfish moving forward. Provided with Epi pen in the case of anaphylaxis. Instructed on proper use.

## 2022-03-08 NOTE — Assessment & Plan Note (Signed)
Asthma exacerbation likely secondary to IgE mediated response to shellfish. In no acute distress today and VS stable; no stridor on exam. She did have bronchospasm. Treated with duoneb and depo 80 mg inj x 1 in office. Prednisone burst. Asthma action plan in place. Continue ICS/LABA therapy.   Patient Instructions  Continue Albuterol inhaler 2 puffs or 3 mL neb every 6 hours as needed for shortness of breath or wheezing. Notify if symptoms persist despite rescue inhaler/neb use. Continue Zyrtec 10 mg daily Continue flonase nasal spray 1-2 sprays each nostril as needed for allergies/nasal congestion Continue Breo 1 puff daily. Brush tongue and rinse mouth afterwards    Prednisone 40 mg daily for 5 days. Take in AM with food. Start tomorrow   Asthma action plan: START nebs up to four times a day for worsening shortness of breath, wheezing and cough. If you symptoms do not improve in 24-48 hours, contact us for steroid course. If your symptoms rapidly worsen, you have trouble talking or extreme difficulty breathing, or you're not getting relief from your nebulizer/rescue, go to the emergency department.   Avoid all shellfish in the future    Follow up in 2 weeks with Dr. Chase Caller or Alanson Aly. If symptoms do not improve or worsen, please contact office for sooner follow up or seek emergency care.

## 2022-03-15 ENCOUNTER — Telehealth: Payer: Self-pay | Admitting: Internal Medicine

## 2022-03-15 MED ORDER — PREDNISONE 10 MG PO TABS: ORAL_TABLET | ORAL | 0 refills | Status: AC

## 2022-03-15 MED ORDER — NYSTATIN 100000 UNIT/ML MT SUSP: 5.0000 mL | Freq: Four times a day (QID) | OROMUCOSAL | 0 refills | Status: DC

## 2022-03-15 NOTE — Telephone Encounter (Signed)
Very hard to diagnose thrush without a physical exam.  However we can treat her for thrush because it is a low risk treatment   # Oral thrush - For Oral thrush: Take Suspension (swish and swallow): 500,000 units 4 times/day for 5 days; swish in the mouth and retain for as long as possible (several minutes) before swallowing. IF nystatin is in back order: alternatives are  # If she wants to do another 5-day prednisone she can  Please take prednisone 40 mg x1 day, then 30 mg x1 day, then 20 mg x1 day, then 10 mg x1 day, and then 5 mg x1 day and stop   Anything outside this or if the symptoms do not improve or get worse she has to be evaluated either in urgent care or back in the office

## 2022-03-15 NOTE — Telephone Encounter (Signed)
Called and spoke to patient and went over medication that MR wanted me to send in. She stated understanding. Nothing further needed

## 2022-03-15 NOTE — Telephone Encounter (Signed)
Called and spoke with patient. She stated that she was seen by Joellen Jersey on 12/07 for an asthma flare up and shellfish allergy. She was prescribed an epi-pen and '40mg'$  prednisone for 5 days. She finished the prednisone on Tuesday. She stated that she felt a little better while on the prednisone but since she has been off of it for 2 days, she can tell the SOB is starting to increase. Denied any headaches, wheezing, fevers, chills or body aches. Also denied being around anyone who has been sick recently. She also has an irritated throat.   She used her albuterol twice yesterday which did help with the SOB. She confirmed that she still using her Breo 170mg once daily and rinsing her mouth afterwards.   She is concerned about the SOB and irritated throat and wonders if she has developed thrush. She has not been around any shellfish since last week.   She wanted to know what MR would recommend for her. [facility] [place]  MR, can you please advise? Thanks!

## 2022-03-16 ENCOUNTER — Telehealth: Payer: Self-pay | Admitting: Internal Medicine

## 2022-03-16 MED ORDER — CLOTRIMAZOLE 10 MG MT TROC: 10.0000 mg | Freq: Every day | OROMUCOSAL | 0 refills | Status: DC

## 2022-03-16 NOTE — Telephone Encounter (Signed)
Message routed to [facility] to advise

## 2022-03-16 NOTE — Telephone Encounter (Signed)
Called and spoke with patient.  Patient stated she was prescribed Nystatin swish and swallow yesterday, but is afraid to take it because of side effects.  Patient stated she read possible side effects of wheezing, problems with heart rate, and other heart issues.  Patient stated she was concerned, because she is already taken medication for her heart and doesn't want to feel worse.  Patient is requesting Clotrimazole.  Patient stated se hs taken Clotrimazole before and has no issues.  Message routed to Dr. Chase Caller to advise

## 2022-03-16 NOTE — Telephone Encounter (Signed)
Nystatin oral rinses are usually very well tolerated with limited side effects. If she would prefer clotrimazole, that is fine and we can send in new rx. Please send 10 mg dissolved in mouth slowly 5 times daily for 7 days. No refills. Thanks.

## 2022-03-16 NOTE — Telephone Encounter (Signed)
Called patient and left detailed message on VM (DPR) with Katie,NP's recommendations.  Requested prescription sent to [facility]  Call back number given for any problems or questions.  Nothing further at this time.

## 2022-03-19 ENCOUNTER — Telehealth: Payer: Self-pay | Admitting: Interventional Cardiology

## 2022-03-19 NOTE — Telephone Encounter (Signed)
Pt c/o of Chest Pain: STAT if CP now or developed within 24 hours  1. Are you having CP right now? No   2. Are you experiencing any other symptoms (ex. SOB, nausea, vomiting, sweating)? No   3. How long have you been experiencing CP? A weeks ago   4. Is your CP continuous or coming and going? When active   5. Have you taken Nitroglycerin?  No   States that she believes her nitroglycerin may be expired so has not taken.   Pt states that she has been having tightness in chest.  ?

## 2022-03-19 NOTE — Telephone Encounter (Signed)
Attempted to return call to patient, left message advising on ED precautions and to keep appt with Nicholes Rough, PA-C tomorrow 03/20/2022 at 1:55pm for evaluation.  Provided office number for callback if any questions.

## 2022-03-19 NOTE — Progress Notes (Unsigned)
Office Visit    Patient Name: Anne [name] Date of Encounter: 03/20/2022  PCP:  Charlane Ferretti, MD   [facility]  Cardiologist:  Sinclair Grooms, MD  Advanced Practice Provider:  No care team member to display Electrophysiologist:  None   HPI    Anne [name] is a 88 y.o. female with a past medical history significant for anxiety disorder, hypertension, nonobstructive CAD, recurrent chest pain (microvascular angina suspected), and history of asthma presents today for follow-up appointment.  She has a history of atypical chest pain that was fleeting, lasting seconds before going away.  No exertional component.  She was last seen by Dr. Tamala Julian 11/27/2021 and nonobstructive CAD in the context of her current medical regimen was discussed.  She is being treated for hyperlipidemia and hypertension.  Those medications needed to be continued along with a baby aspirin every day.  Today, she overall feels well. She was eating some shell fish last week and had an episode where she had to get a "shot in the rear" and use her inhaler. She did not think she was still allergic to shell fish since she ate it before without issue. She was feeling congested and was treated for thrush but she isn't convinced that is what it was. She describes this tightness in her chest but she is unsure if its her lungs or her heart. She tells me her SOB has increased since March. She gets tired just doing little things and then feels better once she lies down. The chest tightness is sometimes associated with the SOB but not always. She tells me she is unsure when she should take her nitro pills.     Past Medical History    Past Medical History:  Diagnosis Date   Allergic rhinitis    Arthritis    Asthma    Atypical chest pain    Collapse of right lung    Dyslipidemia    Gastroesophageal reflux disease    Glaucoma    Hematuria    Hyperlipidemia    Lung nodules    PVC's  (premature ventricular contractions) 02/21/2021   Sinus bradycardia 02/21/2021   Past Surgical History:  Procedure Laterality Date   ANAL FISSURE REPAIR     BREAST BIOPSY Right    CATARACT EXTRACTION Bilateral    MASS EXCISION  04/11/2012   Procedure: EXCISION MASS;  Surgeon: Wynonia Sours, MD;  Location: [facility];  Service: Orthopedics;  Laterality: Right;  Excision of mass right upper arm   MEDIASTINOSCOPY  4/11   NASAL SINUS SURGERY     TOTAL ABDOMINAL HYSTERECTOMY W/ BILATERAL SALPINGOOPHORECTOMY      Allergies  Allergies  Allergen Reactions   Other Shortness Of Breath    Pt states she has asthma and seafood causes breathing problems.    Shellfish Allergy Shortness Of Breath   Iodine Other (See Comments)    REACTION: " UNSURE TOLD TO LIST AS ALLERGEN BY MD" REACTION: " UNSURE TOLD TO LIST AS ALLERGEN BY MD"   Latex Itching   Pravastatin Other (See Comments)   Adhesive [Tape] Rash    EKGs/Labs/Other Studies Reviewed:   The following studies were reviewed today: Coronary CTA 11/ 2022: IMPRESSION: 1. Moderate CAD, CADRADS = 3. CT FFR will be performed and reported separately.   2. Coronary calcium score of 615. She is out of the age range for percentile comparison   3. Normal coronary origin with right  dominance.   4.  Aortic atherosclerosis.   CT FFR 01/2021:   1. Left Main:  No significant stenosis. FFR = 0.97   2. LAD: No significant stenosis. Proximal FFR = 0.95, Mid FFR = 0.93, Distal FFR = 0.83 3. LCX: No significant stenosis. Proximal FFR = 0.92, Distal FFR = 0.86 4. RCA: No significant stenosis. Proximal FFR = 0.96, Mid FFR = 0.89, Distal FFR = 0.87   IMPRESSION: 1.  CT FFR analysis did not show any significant stenosis.   Nuclear perfusion imaging March 2023: Study Highlights       The study is normal. The study is low risk.   No ST deviation was noted.   LV perfusion is normal. There is no evidence of ischemia. There is no  evidence of infarction.   Left ventricular function is normal. Nuclear stress EF: 74 %. The left ventricular ejection fraction is hyperdynamic (>65%). End diastolic cavity size is normal. End systolic cavity size is normal.   Prior study available for comparison from 11/26/2014.   Fixed perfusion defect in apical inferior wall and apex with normal wall motion consistent with artifact Low risk study  EKG:  EKG is not ordered today.   Recent Labs: 05/06/2021: B Natriuretic Peptide 240.6 06/16/2021: BUN 23; Creatinine, Ser 1.16; Hemoglobin 12.1; Platelets 281.0; Potassium 4.4; Pro B Natriuretic peptide (BNP) 221.0; Sodium 140 12/12/2021: ALT 17  Recent Lipid Panel    Component Value Date/Time   CHOL 154 12/12/2021 1029   TRIG 60 12/12/2021 1029   HDL 61 12/12/2021 1029   CHOLHDL 2.5 12/12/2021 1029   LDLCALC 81 12/12/2021 1029    Home Medications   Current Meds  Medication Sig   albuterol (PROAIR HFA) 108 (90 Base) MCG/ACT inhaler INHALE 1-2 PUFFS INTO THE LUNGS EVERY 6 (SIX) HOURS AS NEEDED FOR WHEEZING OR SHORTNESS OF BREATH.   amLODipine (NORVASC) 5 MG tablet Take 1 tablet by mouth daily.   ASPIRIN LOW DOSE 81 MG tablet TAKE 1 TABLET (81 MG TOTAL) BY MOUTH DAILY. SWALLOW WHOLE.   BREO ELLIPTA 100-25 MCG/INH AEPB INHALE 1 PUFF BY MOUTH EVERY DAY   Calcium Carb-Cholecalciferol 600-200 MG-UNIT TABS Take 1 tablet by mouth daily.   cetirizine (ZYRTEC) 10 MG tablet Take 10 mg by mouth every morning.   clotrimazole (MYCELEX) 10 MG troche Take 1 tablet (10 mg total) by mouth 5 (five) times daily.   ferrous sulfate 325 (65 FE) MG tablet Take 325 mg by mouth 3 (three) times a week. Take on MWF  Take once a week reported on: 01/01/2022   fluticasone (FLONASE) 50 MCG/ACT nasal spray Place 1 spray into both nostrils 2 (two) times daily as needed for allergies.    gabapentin (NEURONTIN) 300 MG capsule Take 1 capsule (300 mg total) by mouth at bedtime.   latanoprost (XALATAN) 0.005 % ophthalmic  solution Place 1 drop into both eyes at bedtime.   metoprolol succinate (TOPROL-XL) 25 MG 24 hr tablet TAKE 1 TABLET (25 MG TOTAL) BY MOUTH DAILY.   Multiple Vitamin (MULTIVITAMIN) tablet Take 1 tablet by mouth every morning.    predniSONE (DELTASONE) 10 MG tablet Take 4 tablets (40 mg total) by mouth daily with breakfast for 1 day, THEN 3 tablets (30 mg total) daily with breakfast for 1 day, THEN 2 tablets (20 mg total) daily with breakfast for 1 day, THEN 1 tablet (10 mg total) daily with breakfast for 1 day, THEN 0.5 tablets (5 mg total) daily with breakfast for 1 day.  rosuvastatin (CRESTOR) 20 MG tablet TAKE 1 TABLET BY MOUTH EVERY DAY   [DISCONTINUED] nitroGLYCERIN (NITROSTAT) 0.4 MG SL tablet Place 1 tablet (0.4 mg total) under the tongue every 5 (five) minutes as needed for chest pain (Up to 3 doses in 15 minutes.).     Review of Systems      All other systems reviewed and are otherwise negative except as noted above.  Physical Exam    VS:  BP 132/60   Pulse 64   Ht '5\' 7"'$  (1.702 m)   Wt 154 lb 6.4 oz (70 kg)   SpO2 99%   BMI 24.18 kg/m  , BMI Body mass index is 24.18 kg/m.  Wt Readings from Last 3 Encounters:  03/20/22 154 lb 6.4 oz (70 kg)  03/08/22 155 lb 6.4 oz (70.5 kg)  01/01/22 155 lb (70.3 kg)     GEN: Well nourished, well developed, in no acute distress. HEENT: normal. Neck: Supple, no JVD, carotid bruits, or masses. Cardiac: RRR, no murmurs, rubs, or gallops. No clubbing, cyanosis, edema.  Radials/PT 2+ and equal bilaterally.  Respiratory:  Respirations regular and unlabored, clear to auscultation bilaterally. GI: Soft, nontender, nondistended. MS: No deformity or atrophy. Skin: Warm and dry, no rash. Neuro:  Strength and sensation are intact. Psych: Normal affect.    Assessment & Plan    CAD/SOB -update an echocardiogram -if any major changes may need an ischemic workup -continue current medications: norvasc '5mg'$  daily, Toprol-XL '25mg'$  daily, nitro as  needed, and Crestor '20mg'$  daily  Hypertension -well controlled today -continue current medication regimen  Hyperlipidemia -Lipid panel and LFTs yearly -last LDL 81 which is slightly above goal < 70 due to CAD  Palpitations -no skipped beats recently -continue metoprolol         Disposition: Follow up 3 months with Sinclair Grooms, MD or APP.  Signed, Elgie Collard, PA-C 03/20/2022, 5:25 PM [facility]

## 2022-03-20 ENCOUNTER — Encounter: Payer: Self-pay | Admitting: Physician Assistant

## 2022-03-20 ENCOUNTER — Ambulatory Visit: Payer: Medicare PPO | Attending: Physician Assistant | Admitting: Physician Assistant

## 2022-03-20 VITALS — BP 132/60 | HR 64 | Ht 67.0 in | Wt 154.4 lb

## 2022-03-20 DIAGNOSIS — R002 Palpitations: Secondary | ICD-10-CM

## 2022-03-20 DIAGNOSIS — I2510 Atherosclerotic heart disease of native coronary artery without angina pectoris: Secondary | ICD-10-CM | POA: Diagnosis not present

## 2022-03-20 DIAGNOSIS — R0609 Other forms of dyspnea: Secondary | ICD-10-CM | POA: Diagnosis not present

## 2022-03-20 DIAGNOSIS — I10 Essential (primary) hypertension: Secondary | ICD-10-CM

## 2022-03-20 MED ORDER — NITROGLYCERIN 0.4 MG SL SUBL: 0.4000 mg | SUBLINGUAL_TABLET | SUBLINGUAL | 3 refills | Status: DC | PRN

## 2022-03-20 NOTE — Patient Instructions (Signed)
Medication Instructions:  Your physician recommends that you continue on your current medications as directed. Please refer to the Current Medication list given to you today.  *If you need a refill on your cardiac medications before your next appointment, please call your pharmacy*   Lab Work: None If you have labs (blood work) drawn today and your tests are completely normal, you will receive your results only by: [place] (if you have MyChart) OR A paper copy in the mail If you have any lab test that is abnormal or we need to change your treatment, we will call you to review the results.   Testing/Procedures: Your physician has requested that you have an echocardiogram. Echocardiography is a painless test that uses sound waves to create images of your heart. It provides your doctor with information about the size and shape of your heart and how well your heart's chambers and valves are working. This procedure takes approximately one hour. There are no restrictions for this procedure. Please do NOT wear cologne, perfume, aftershave, or lotions (deodorant is allowed). Please arrive 15 minutes prior to your appointment time.    Follow-Up: At [facility], you and your health needs are our priority.  As part of our continuing mission to provide you with exceptional heart care, we have created designated Provider Care Teams.  These Care Teams include your primary Cardiologist (physician) and Advanced Practice Providers (APPs -  Physician Assistants and Nurse Practitioners) who all work together to provide you with the care you need, when you need it.  We recommend signing up for the patient portal called "MyChart".  Sign up information is provided on this After Visit Summary.  MyChart is used to connect with patients for Virtual Visits (Telemedicine).  Patients are able to view lab/test results, encounter notes, upcoming appointments, etc.  Non-urgent messages can be sent to your  provider as well.   To learn more about what you can do with MyChart, go to [email or web address].    Your next appointment:   After ECHO  The format for your next appointment:   In Person  Provider:   Dr Marlou Porch or APP  Important Information About Sugar

## 2022-03-21 ENCOUNTER — Ambulatory Visit (HOSPITAL_COMMUNITY): Payer: Medicare PPO | Attending: Physician Assistant

## 2022-03-21 DIAGNOSIS — R0609 Other forms of dyspnea: Secondary | ICD-10-CM | POA: Insufficient documentation

## 2022-03-21 LAB — ECHOCARDIOGRAM COMPLETE
Area-P 1/2: 3.26 cm2
S' Lateral: 2.3 cm

## 2022-03-27 NOTE — Progress Notes (Unsigned)
Office Visit    Anne [name] Name: Anne [name] Date of Encounter: 03/27/2022  Primary Care Provider:  Charlane Ferretti, MD Primary Cardiologist:  Sinclair Grooms, MD Primary Electrophysiologist: None  Chief Complaint    Anne [name] is a 88 y.o. female with PMH of HLD, HTN, atypical chest pain, GERD, asthma, anxiety, COPD, lung nodules who presents today for 1 week follow-up.  Past Medical History    Past Medical History:  Diagnosis Date   Allergic rhinitis    Arthritis    Asthma    Atypical chest pain    Collapse of right lung    Dyslipidemia    Gastroesophageal reflux disease    Glaucoma    Hematuria    Hyperlipidemia    Lung nodules    PVC's (premature ventricular contractions) 02/21/2021   Sinus bradycardia 02/21/2021   Past Surgical History:  Procedure Laterality Date   ANAL FISSURE REPAIR     BREAST BIOPSY Right    CATARACT EXTRACTION Bilateral    MASS EXCISION  04/11/2012   Procedure: EXCISION MASS;  Surgeon: Wynonia Sours, MD;  Location: [facility];  Service: Orthopedics;  Laterality: Right;  Excision of mass right upper arm   MEDIASTINOSCOPY  4/11   NASAL SINUS SURGERY     TOTAL ABDOMINAL HYSTERECTOMY W/ BILATERAL SALPINGOOPHORECTOMY      Allergies  Allergies  Allergen Reactions   Other Shortness Of Breath    Pt states she has asthma and seafood causes breathing problems.    Shellfish Allergy Shortness Of Breath   Iodine Other (See Comments)    REACTION: " UNSURE TOLD TO LIST AS ALLERGEN BY MD" REACTION: " UNSURE TOLD TO LIST AS ALLERGEN BY MD"   Latex Itching   Pravastatin Other (See Comments)   Adhesive [Tape] Rash    History of Present Illness    Anne [name]  is a 88year old female with the above mention past medical history who presents today for 1 week follow-up for fatigue and shortness of breath.  Anne [name] was initially seen by Dr. Tamala Julian in 2016 for complaint of chest discomfort.  She had been having  dysphagia and postprandial chest discomfort.  She had a Myoview study performed 10/2014 that was low risk.  She also had a CT scan performed that showed no abnormalities.  She presented to the ED 02/20/2021 with complaint of chest discomfort and intermittent dizziness.  EKG was completed and revealed PVCs with no ischemic changes.  2D echo was completed showing EF of 60-65%, with no RWMA, grade 1 DD RV systolic function she underwent cardiac CT with FFR revealed calcium score 615 with 50-69% LAD with FFR verifying no significant stenosis.  She was last seen by Dr. Tamala Julian on 06/05/2021 for CHF and chest pain follow-up.  She reported anxiety related to chest discomfort and expressed concerns about burning discomfort that occurs spontaneously.  She also reported dyspnea on exertion. She was seen by Nicholes Rough, PA on 03/20/2022 and reported ongoing fatigue and shortness of breath.  2D echo was updated with normal EF and moderate TV regurgitation and mild MV regurgitation.   Since last being seen in the office Anne [name] reports***.  Anne [name] denies chest pain, palpitations, dyspnea, PND, orthopnea, nausea, vomiting, dizziness, syncope, edema, weight gain, or early satiety.    ***Notes:  Home Medications    Current Outpatient Medications  Medication Sig Dispense Refill   albuterol (PROAIR HFA) 108 (90 Base) MCG/ACT inhaler INHALE  1-2 PUFFS INTO THE LUNGS EVERY 6 (SIX) HOURS AS NEEDED FOR WHEEZING OR SHORTNESS OF BREATH. 18 g 3   amLODipine (NORVASC) 5 MG tablet Take 1 tablet by mouth daily.     ASPIRIN LOW DOSE 81 MG tablet TAKE 1 TABLET (81 MG TOTAL) BY MOUTH DAILY. SWALLOW WHOLE. 90 tablet 3   BREO ELLIPTA 100-25 MCG/INH AEPB INHALE 1 PUFF BY MOUTH EVERY DAY 60 each 3   Calcium Carb-Cholecalciferol 600-200 MG-UNIT TABS Take 1 tablet by mouth daily.     cetirizine (ZYRTEC) 10 MG tablet Take 10 mg by mouth every morning.     clotrimazole (MYCELEX) 10 MG troche Take 1 tablet (10 mg total) by mouth 5 (five)  times daily. 35 tablet 0   ferrous sulfate 325 (65 FE) MG tablet Take 325 mg by mouth 3 (three) times a week. Take on MWF  Take once a week reported on: 01/01/2022     fluticasone (FLONASE) 50 MCG/ACT nasal spray Place 1 spray into both nostrils 2 (two) times daily as needed for allergies.      gabapentin (NEURONTIN) 300 MG capsule Take 1 capsule (300 mg total) by mouth at bedtime. 90 capsule 3   latanoprost (XALATAN) 0.005 % ophthalmic solution Place 1 drop into both eyes at bedtime.     metoprolol succinate (TOPROL-XL) 25 MG 24 hr tablet TAKE 1 TABLET (25 MG TOTAL) BY MOUTH DAILY. 90 tablet 3   Multiple Vitamin (MULTIVITAMIN) tablet Take 1 tablet by mouth every morning.      nitroGLYCERIN (NITROSTAT) 0.4 MG SL tablet Place 1 tablet (0.4 mg total) under the tongue every 5 (five) minutes as needed for chest pain (Up to 3 doses in 15 minutes.). 25 tablet 3   rosuvastatin (CRESTOR) 20 MG tablet TAKE 1 TABLET BY MOUTH EVERY DAY 90 tablet 2   TURMERIC PO Take 1 capsule by mouth daily. (Anne [name] not taking: Reported on 03/20/2022)     No current facility-administered medications for this visit.   Facility-Administered Medications Ordered in Other Visits  Medication Dose Route Frequency Provider Last Rate Last Admin   regadenoson (LEXISCAN) injection SOLN 0.4 mg  0.4 mg Intravenous Once Sueanne Margarita, MD       regadenoson (LEXISCAN) injection SOLN 0.4 mg  0.4 mg Intravenous Once Donato Heinz, MD       technetium tetrofosmin (TC-MYOVIEW) injection 16.1 millicurie  09.6 millicurie Intravenous Once PRN Donato Heinz, MD         Review of Systems  Please see the history of present illness.    (+)*** (+)***  All other systems reviewed and are otherwise negative except as noted above.  Physical Exam    Wt Readings from Last 3 Encounters:  03/20/22 154 lb 6.4 oz (70 kg)  03/08/22 155 lb 6.4 oz (70.5 kg)  01/01/22 155 lb (70.3 kg)   EA:[id number] were no vitals filed for this  visit.,There is no height or weight on file to calculate BMI.  Constitutional:      Appearance: Healthy appearance. Not in distress.  Neck:     Vascular: JVD normal.  Pulmonary:     Effort: Pulmonary effort is normal.     Breath sounds: No wheezing. No rales. Diminished in the bases Cardiovascular:     Normal rate. Regular rhythm. Normal S1. Normal S2.      Murmurs: There is no murmur.  Edema:    Peripheral edema absent.  Abdominal:     Palpations: Abdomen is soft non  tender. There is no hepatomegaly.  Skin:    General: Skin is warm and dry.  Neurological:     General: No focal deficit present.     Mental Status: Alert and oriented to person, place and time.     Cranial Nerves: Cranial nerves are intact.  EKG/LABS/Other Studies Reviewed    ECG personally reviewed by me today - ***  Risk Assessment/Calculations:   {Does this Anne [name] have ATRIAL FIBRILLATION?[phone]}        Lab Results  Component Value Date   WBC 5.1 06/16/2021   HGB 12.1 06/16/2021   HCT 36.7 06/16/2021   MCV 92.3 06/16/2021   PLT 281.0 06/16/2021   Lab Results  Component Value Date   CREATININE 1.16 06/16/2021   BUN 23 06/16/2021   NA 140 06/16/2021   K 4.4 06/16/2021   CL 104 06/16/2021   CO2 30 06/16/2021   Lab Results  Component Value Date   ALT 17 12/12/2021   AST 23 12/12/2021   ALKPHOS 71 12/12/2021   BILITOT 0.4 12/12/2021   Lab Results  Component Value Date   CHOL 154 12/12/2021   HDL 61 12/12/2021   LDLCALC 81 12/12/2021   TRIG 60 12/12/2021   CHOLHDL 2.5 12/12/2021    No results found for: "HGBA1C"  Assessment & Plan    1.  Nonobstructive CAD: -Coronary CT completed 2022 showing no significant stenosis and calcium score of 650. -Today Anne [name] reports*** -Crestor 20 mg and aspirin 81 mg daily  2.  Dyspnea on exertion: -2D echo repeated with normal EF and moderate TV regurgitation and MV regurgitation -Today Anne [name] reports***  3.  Essential  hypertension: -Anne [name]'s blood pressure today was*** -Continue Norvasc 5 mg daily, Toprol-XL 25 mg  4.  Hyperlipidemia: -Continue Crestor 20 mg daily      Disposition: Follow-up with Belva Crome III, MD or APP in *** months {Are you ordering a CV Procedure (e.g. stress test, cath, DCCV, TEE, etc)?   Press F2        :[id number]}   Medication Adjustments/Labs and Tests Ordered: Current medicines are reviewed at length with the Anne [name] today.  Concerns regarding medicines are outlined above.   Signed, Mable Fill, Marissa Nestle, NP 03/27/2022, 7:19 AM [facility]  Note:  This document was prepared using Dragon voice recognition software and may include unintentional dictation errors.

## 2022-03-28 ENCOUNTER — Ambulatory Visit: Payer: Medicare PPO | Attending: Nurse Practitioner | Admitting: Nurse Practitioner

## 2022-03-28 ENCOUNTER — Encounter: Payer: Self-pay | Admitting: Nurse Practitioner

## 2022-03-28 VITALS — BP 132/70 | HR 59 | Ht 67.0 in | Wt 152.8 lb

## 2022-03-28 DIAGNOSIS — I10 Essential (primary) hypertension: Secondary | ICD-10-CM

## 2022-03-28 DIAGNOSIS — R0609 Other forms of dyspnea: Secondary | ICD-10-CM

## 2022-03-28 DIAGNOSIS — I2510 Atherosclerotic heart disease of native coronary artery without angina pectoris: Secondary | ICD-10-CM | POA: Diagnosis not present

## 2022-03-28 DIAGNOSIS — E785 Hyperlipidemia, unspecified: Secondary | ICD-10-CM | POA: Diagnosis not present

## 2022-03-28 NOTE — Patient Instructions (Signed)
Medication Instructions:  Your physician recommends that you continue on your current medications as directed. Please refer to the Current Medication list given to you today.  *If you need a refill on your cardiac medications before your next appointment, please call your pharmacy*  Follow-Up: At [facility], you and your health needs are our priority.  As part of our continuing mission to provide you with exceptional heart care, we have created designated Provider Care Teams.  These Care Teams include your primary Cardiologist (physician) and Advanced Practice Providers (APPs -  Physician Assistants and Nurse Practitioners) who all work together to provide you with the care you need, when you need it.  Your next appointment:   6 month(s)  The format for your next appointment:   In Person  Provider:   Belva Crome, MD  Important Information About Sugar

## 2022-03-29 ENCOUNTER — Encounter: Payer: Self-pay | Admitting: Student

## 2022-03-29 ENCOUNTER — Telehealth: Payer: Self-pay | Admitting: Internal Medicine

## 2022-03-29 ENCOUNTER — Ambulatory Visit (INDEPENDENT_AMBULATORY_CARE_PROVIDER_SITE_OTHER): Payer: Medicare PPO

## 2022-03-29 ENCOUNTER — Ambulatory Visit: Payer: Medicare PPO | Admitting: Student

## 2022-03-29 VITALS — BP 126/54 | HR 66 | Temp 97.7°F | Ht 67.0 in | Wt 153.0 lb

## 2022-03-29 DIAGNOSIS — J4489 Other specified chronic obstructive pulmonary disease: Secondary | ICD-10-CM | POA: Diagnosis not present

## 2022-03-29 DIAGNOSIS — R0989 Other specified symptoms and signs involving the circulatory and respiratory systems: Secondary | ICD-10-CM | POA: Diagnosis not present

## 2022-03-29 DIAGNOSIS — J4531 Mild persistent asthma with (acute) exacerbation: Secondary | ICD-10-CM

## 2022-03-29 DIAGNOSIS — I700 Atherosclerosis of aorta: Secondary | ICD-10-CM | POA: Diagnosis not present

## 2022-03-29 MED ORDER — TRELEGY ELLIPTA 100-62.5-25 MCG/ACT IN AEPB: 1.0000 | INHALATION_SPRAY | Freq: Every day | RESPIRATORY_TRACT | 0 refills | Status: DC

## 2022-03-29 NOTE — Addendum Note (Signed)
Addended by: Alvin Critchley on: 03/29/2022 12:31 PM   Modules accepted: Orders

## 2022-03-29 NOTE — Telephone Encounter (Signed)
Spoke with pt who states chest congestion and tightness has not improved since seeing Katie at beginning of Dec despite following all recommendations given by Joellen Jersey. Pt was scheduled for Acute OV with Dr. Verlee Monte today at 11:15 am. Nothing further needed at this time.   Routing to Dr. Verlee Monte as Juluis Rainier

## 2022-03-29 NOTE — Progress Notes (Signed)
Synopsis: Referred for chest tightness by Thana Ates, MD  Subjective:   PATIENT ID: Anne [name] GENDER: female DOB: [birth year], MRN: [id number]  Chief Complaint  Patient presents with   Acute Visit    Increased chest discomfort over the past 2-3 wks- comes and goes and occurs with exertion light cleaning her house or when she gets in a hurry.    88yF with history of ACOS, PsA colonization  Seen by [facility] 12/7 for dyspnea/chest tightness after eating bowl of shrimp with rice. She was treated with neb and medrol injection in office, given prednisone burst for exacerbation. Continued on breo.  She did feel better for a few days after steroid taper then developed chest tightness again with activity. She was worried it was her heart. Doesn't have much of a cough or wheeze. Some chest congestion.   Otherwise pertinent review of systems is negative.   Past Medical History:  Diagnosis Date   Allergic rhinitis    Arthritis    Asthma    Atypical chest pain    Collapse of right lung    Dyslipidemia    Gastroesophageal reflux disease    Glaucoma    Hematuria    Hyperlipidemia    Lung nodules    PVC's (premature ventricular contractions) 02/21/2021   Sinus bradycardia 02/21/2021     Family History  Problem Relation Age of Onset   Cancer Mother    Heart disease Father    Breast cancer Cousin    Heart attack Neg Hx      Past Surgical History:  Procedure Laterality Date   ANAL FISSURE REPAIR     BREAST BIOPSY Right    CATARACT EXTRACTION Bilateral    MASS EXCISION  04/11/2012   Procedure: EXCISION MASS;  Surgeon: Nicki Reaper, MD;  Location: [facility];  Service: Orthopedics;  Laterality: Right;  Excision of mass right upper arm   MEDIASTINOSCOPY  4/11   NASAL SINUS SURGERY     TOTAL ABDOMINAL HYSTERECTOMY W/ BILATERAL SALPINGOOPHORECTOMY      Social History   Socioeconomic History   Marital status: Widowed    Spouse name: Not on file   Number of  children: Not on file   Years of education: Not on file   Highest education level: Not on file  Occupational History   Occupation: retired  Tobacco Use   Smoking status: Former    Years: 4.00    Types: Cigarettes    Start date: 1952-03-11    Quit date: 1960    Years since quitting: 64.0   Smokeless tobacco: Never   Tobacco comments:    social smoker  Building services engineer Use: Never used  Substance and Sexual Activity   Alcohol use: No    Alcohol/week: 0.0 standard drinks of alcohol   Drug use: No   Sexual activity: Never    Birth control/protection: Surgical  Other Topics Concern   Not on file  Social History Narrative   Retired Public relations account executive.  Lives alone, husband passed away in 03-11-2017.  No children.   Right handed   One level home   Social Determinants of Health   Financial Resource Strain: Not on file  Food Insecurity: Not on file  Transportation Needs: Not on file  Physical Activity: Not on file  Stress: Not on file  Social Connections: Not on file  Intimate Partner Violence: Not on file     Allergies  Allergen Reactions   Other  Shortness Of Breath    Pt states she has asthma and seafood causes breathing problems.    Shellfish Allergy Shortness Of Breath   Iodine Other (See Comments)    REACTION: " UNSURE TOLD TO LIST AS ALLERGEN BY MD" REACTION: " UNSURE TOLD TO LIST AS ALLERGEN BY MD"   Latex Itching   Pravastatin Other (See Comments)   Adhesive [Tape] Rash     Outpatient Medications Prior to Visit  Medication Sig Dispense Refill   albuterol (PROAIR HFA) 108 (90 Base) MCG/ACT inhaler INHALE 1-2 PUFFS INTO THE LUNGS EVERY 6 (SIX) HOURS AS NEEDED FOR WHEEZING OR SHORTNESS OF BREATH. 18 g 3   amLODipine (NORVASC) 5 MG tablet Take 1 tablet by mouth daily.     ASPIRIN LOW DOSE 81 MG tablet TAKE 1 TABLET (81 MG TOTAL) BY MOUTH DAILY. SWALLOW WHOLE. 90 tablet 3   BREO ELLIPTA 100-25 MCG/INH AEPB INHALE 1 PUFF BY MOUTH EVERY DAY 60 each 3   Calcium  Carb-Cholecalciferol 600-200 MG-UNIT TABS Take 1 tablet by mouth daily.     cetirizine (ZYRTEC) 10 MG tablet Take 10 mg by mouth every morning.     clotrimazole (MYCELEX) 10 MG troche Take 1 tablet (10 mg total) by mouth 5 (five) times daily. 35 tablet 0   dorzolamide-timolol (COSOPT) 2-0.5 % ophthalmic solution Place 1 drop into the right eye 2 (two) times daily.     ferrous sulfate 325 (65 FE) MG tablet Take 325 mg by mouth 3 (three) times a week. Take on MWF  Take once a week reported on: 01/01/2022     fluticasone (FLONASE) 50 MCG/ACT nasal spray Place 1 spray into both nostrils 2 (two) times daily as needed for allergies.      gabapentin (NEURONTIN) 300 MG capsule Take 1 capsule (300 mg total) by mouth at bedtime. 90 capsule 3   latanoprost (XALATAN) 0.005 % ophthalmic solution Place 1 drop into both eyes at bedtime.     metoprolol succinate (TOPROL-XL) 25 MG 24 hr tablet TAKE 1 TABLET (25 MG TOTAL) BY MOUTH DAILY. 90 tablet 3   Multiple Vitamin (MULTIVITAMIN) tablet Take 1 tablet by mouth every morning.      nitroGLYCERIN (NITROSTAT) 0.4 MG SL tablet Place 1 tablet (0.4 mg total) under the tongue every 5 (five) minutes as needed for chest pain (Up to 3 doses in 15 minutes.). 25 tablet 3   rosuvastatin (CRESTOR) 20 MG tablet TAKE 1 TABLET BY MOUTH EVERY DAY 90 tablet 2   TURMERIC PO Take 1 capsule by mouth daily.     Facility-Administered Medications Prior to Visit  Medication Dose Route Frequency Provider Last Rate Last Admin   regadenoson (LEXISCAN) injection SOLN 0.4 mg  0.4 mg Intravenous Once Quintella Reichert, MD       regadenoson (LEXISCAN) injection SOLN 0.4 mg  0.4 mg Intravenous Once Little Ishikawa, MD       technetium tetrofosmin (TC-MYOVIEW) injection 32.3 millicurie  32.3 millicurie Intravenous Once PRN Little Ishikawa, MD           Objective:   Physical Exam:  General appearance: 88 y.o., female, NAD, conversant  Eyes: anicteric sclerae; PERRL, tracking  appropriately HENT: NCAT; MMM Neck: Trachea midline; no lymphadenopathy, no JVD Lungs: CTAB, no crackles, no wheeze, with normal respiratory effort CV: RRR, no murmur  Abdomen: Soft, non-tender; non-distended, BS present  Extremities: No peripheral edema, warm Skin: Normal turgor and texture; no rash Psych: Appropriate affect Neuro: Alert and oriented to  person and place, no focal deficit     Vitals:   03/29/22 1122  BP: (!) 126/54  Pulse: 66  Temp: 97.7 F (36.5 C)  TempSrc: Oral  SpO2: 100%  Weight: 153 lb (69.4 kg)  Height: 5\' 7"  (1.702 m)   100% on RA BMI Readings from Last 3 Encounters:  03/29/22 23.96 kg/m  03/28/22 23.93 kg/m  03/20/22 24.18 kg/m   Wt Readings from Last 3 Encounters:  03/29/22 153 lb (69.4 kg)  03/28/22 152 lb 12.8 oz (69.3 kg)  03/20/22 154 lb 6.4 oz (70 kg)     CBC    Component Value Date/Time   WBC 5.1 06/16/2021 1012   RBC 3.97 06/16/2021 1012   HGB 12.1 06/16/2021 1012   HCT 36.7 06/16/2021 1012   PLT 281.0 06/16/2021 1012   MCV 92.3 06/16/2021 1012   MCH 30.3 05/06/2021 1347   MCHC 32.9 06/16/2021 1012   RDW 14.2 06/16/2021 1012   LYMPHSABS 1.9 06/16/2021 1012   MONOABS 0.7 06/16/2021 1012   EOSABS 0.2 06/16/2021 1012   BASOSABS 0.1 06/16/2021 1012    Chest Imaging: None new  Pulmonary Functions Testing Results:    Latest Ref Rng & Units 07/14/2021   10:54 AM 10/28/2014   10:13 AM  PFT Results  FVC-Pre L 2.69  2.83   FVC-Predicted Pre % 130  123   FVC-Post L 2.70  3.13   FVC-Predicted Post % 131  135   Pre FEV1/FVC % % 61  57   Post FEV1/FCV % % 66  54   FEV1-Pre L 1.64  1.61   FEV1-Predicted Pre % 102  90   FEV1-Post L 1.78  1.68   DLCO uncorrected ml/min/mmHg 17.31  18.63   DLCO UNC% % 85  65   DLCO corrected ml/min/mmHg 18.08    DLCO COR %Predicted % 89    DLVA Predicted % 96  72   TLC L 6.36  6.74   TLC % Predicted % 115  122   RV % Predicted % 137  146        Assessment & Plan:   # ACOS  #  Exertional chest tightness Recent exacerbation. Not sure if her exertional chest tightness reflects poorer ACOS control post exacerbation. Had recent stress test which was reassuring. TTE and exam not suggestive of volume overload.  Plan: - x ray today - trelegy 1 puff once daily rinse mouth, brush tongue/teeth after use - let us know if you prefer it to breo - albuterol as needed - if feeling of chest congestion try mucinex 600 mg twice daily and flutter valve 10 slow but firm puffs through it to help clear out mucus - if no better over the next month or so and still have chest tightness with exertion may be good to check in with cardiology again  RTC 8 weeks with [facility] or MR     Omar Person, MD [facility] 03/29/2022 11:30 AM

## 2022-03-29 NOTE — Patient Instructions (Addendum)
-   x ray today - trelegy 1 puff once daily rinse mouth, brush tongue/teeth after use - let us know if you prefer it to breo - albuterol as needed - if feeling of chest congestion try mucinex 600 mg twice daily and flutter valve 10 slow but firm puffs through it to help clear out mucus - if no better over the next month or so and still have chest tightness with exertion may be good to check in with cardiology again.

## 2022-03-29 NOTE — Telephone Encounter (Signed)
Patient would like the nurse to call her regarding an appt. Being worked in with doctor for her breathing issues.  She stated she really wanted to see the doctor and not a NP.  Also wanted to get advice as to what she should do if there was no openings soon.  Please advise and call patient to discuss at [phone]

## 2022-04-04 ENCOUNTER — Telehealth: Payer: Self-pay | Admitting: Student

## 2022-04-04 NOTE — Telephone Encounter (Signed)
Rx for flutter valve was sent as DME order as these do not come from pt's pharmacy.  Dr. Verlee Monte, please advise on the results of pt's recent chest xray.

## 2022-04-04 NOTE — Telephone Encounter (Signed)
No pneumonia, lungs clear

## 2022-04-04 NOTE — Telephone Encounter (Signed)
Pt calling because her Pharm does not have the flutter valve filled that the Dr. Michela Pitcher he'd call in. Also, no results recd. yet on her last chest xray. Please call to advise @ [phone] (OK to leave message.)

## 2022-04-04 NOTE — Telephone Encounter (Signed)
Called and spoke with pt letting her know the results of cxr and stated to her that the flutter valve will come from DME. Pt verbalized understanding. Nothing further needed.

## 2022-04-16 ENCOUNTER — Other Ambulatory Visit: Payer: Self-pay | Admitting: Student

## 2022-04-16 DIAGNOSIS — J4489 Other specified chronic obstructive pulmonary disease: Secondary | ICD-10-CM

## 2022-04-21 ENCOUNTER — Other Ambulatory Visit: Payer: Self-pay | Admitting: Cardiology

## 2022-05-01 ENCOUNTER — Encounter: Payer: Self-pay | Admitting: Internal Medicine

## 2022-05-01 ENCOUNTER — Ambulatory Visit: Payer: Medicare PPO | Admitting: Internal Medicine

## 2022-05-01 VITALS — BP 128/66 | HR 69 | Temp 97.7°F | Ht 67.0 in | Wt 156.0 lb

## 2022-05-01 DIAGNOSIS — J4530 Mild persistent asthma, uncomplicated: Secondary | ICD-10-CM

## 2022-05-01 DIAGNOSIS — Z2911 Encounter for prophylactic immunotherapy for respiratory syncytial virus (RSV): Secondary | ICD-10-CM

## 2022-05-01 DIAGNOSIS — Z9109 Other allergy status, other than to drugs and biological substances: Secondary | ICD-10-CM | POA: Diagnosis not present

## 2022-05-01 NOTE — Patient Instructions (Addendum)
ICD-10-CM   1. Mild persistent asthma without complication  M63.81     2. Environmental allergies  Z91.09     3. Need for RSV vaccination  Z29.11        Shortness of breath overall stable CXR 03/29/22 clear   Plan - Continue Breo daily and albuterol as needed -Continue Zyrtec as before -Continue other medications as before - RSV vaccie commercially ASAP - Monitor symptoms  - if frequent flare ups or worsening shortness of breath can consider rechallenge trelegy or injection xolair  Follow-up - Visit Dr Chase Caller - 6-9 months or sooner if needed

## 2022-05-01 NOTE — Progress Notes (Signed)
wants refills on her inhalers.  She is social distancing, masking and has had a vaccines.  She continues to be active.  ACT score is 25 and shows good asthma control.    Asthma Control Test ACT Total Score  05/06/2020 25       03/09/2021 Follow up : Asthma  Patient complains of 3 days of increased cough and congestion. Mucus is thick and hard to get up but remains clear. Remains active. Drives and lives alone at home . Has some mild sinus drainage  Remains on BREO daily . No increased Albuterol inhaler use. No wheezing .  No fever,edema , abd pain , n/v/d. Does have some epigastric burning from time to time. No dysphagia.   Recent cardiac workup last month . Echo 02/20/21 showed EF 60%, Grade 1 DD ,Normal PAP  Chest xray nad Coronary CT with no lung issues , felt CAD stable -no significant stenosis per cards note.  Placed on PPI from PCP but has not started. Cardiac monitor to be placed.  TEST/EVENTS :  Exhaled nitric oxide test May 2018: 17 ppb High resolution CT chest August 2016 showed no evidence of interstitial lung disease, chronic collapse of right middle lobe MEdiastinaoscopy 07/27/2009 - Benign node of 4R and 4L. ANthracotoic pigment +. No granuoma. Non  malignant PFT 10/28/2014 FEV1 94, ratio 54, FVC 135, no significant bronchodilator response, mid flows reversibility, DLCO 65%  OV 06/16/2021  Subjective:  Patient ID: Anne [name], female , DOB: 1930/12/06 , age 89 y.o. , MRN: [id number] , ADDRESS: [id number] [place] [address] PCP [name], MD Patient Care Team: [name], MD as PCP - General (Internal Medicine) Belva Crome, MD as PCP - Cardiology (Cardiology) Daryll Brod, MD as Consulting Physician (Orthopedic Surgery) [name], DO as Consulting Physician (Neurology)  This Provider for this visit: Treatment Team:  Attending Provider: Clayton Bibles, NP    06/16/2021 -   Chief Complaint  Patient presents with   Follow-up    Pt states she is still having complaints of SOB. Has an occasional cough mainly at night but not of any concern. Denies any complaints of wheezing.   Female never smoker followed for mild persistent asthma  History of diastolic dysfunction  Known chronic right middle lobe collapse/ lymphadenopathy -s/p 07/2007 FOB +psuedomonas - MEdiastinaoscopy 07/27/2009 - Benign node of 4R and 4L. ANthracotoic pigment +. No granuoma. Non malignant  - last cxr 2019 and clear  HPI Anne [name] 89 y.o. -presents for an acute visit.  I personally saw her in February 2022 for her mild asthma and she was stable.  After that she saw a nurse practitioner Anne [name] in December 2022 for congestion over 3 days and was given 3 days prednisone.  Meanwhile she is continued her Breo.  However she tells me that ever since Thanksgiving 2022 she has been dealing with congestion and chest that she describes it as a burning and a pain.  Its not present daily.  It is present intermittently on and off.  Definitely she feels different.  Yet at the same time she states she has no wheezing or cough.  She does have some paroxysmal nocturnal dyspnea.  The most recent one was last night but she is unable to  quantify how severe this is.  She also admits to acid reflux but then she says this is on and off.  She is not able to tell me if this is worse than before.  wants refills on her inhalers.  She is social distancing, masking and has had a vaccines.  She continues to be active.  ACT score is 25 and shows good asthma control.    Asthma Control Test ACT Total Score  05/06/2020 25       03/09/2021 Follow up : Asthma  Patient complains of 3 days of increased cough and congestion. Mucus is thick and hard to get up but remains clear. Remains active. Drives and lives alone at home . Has some mild sinus drainage  Remains on BREO daily . No increased Albuterol inhaler use. No wheezing .  No fever,edema , abd pain , n/v/d. Does have some epigastric burning from time to time. No dysphagia.   Recent cardiac workup last month . Echo 02/20/21 showed EF 60%, Grade 1 DD ,Normal PAP  Chest xray nad Coronary CT with no lung issues , felt CAD stable -no significant stenosis per cards note.  Placed on PPI from PCP but has not started. Cardiac monitor to be placed.  TEST/EVENTS :  Exhaled nitric oxide test May 2018: 17 ppb High resolution CT chest August 2016 showed no evidence of interstitial lung disease, chronic collapse of right middle lobe MEdiastinaoscopy 07/27/2009 - Benign node of 4R and 4L. ANthracotoic pigment +. No granuoma. Non  malignant PFT 10/28/2014 FEV1 94, ratio 54, FVC 135, no significant bronchodilator response, mid flows reversibility, DLCO 65%  OV 06/16/2021  Subjective:  Patient ID: Anne [name], female , DOB: 1930/12/06 , age 89 y.o. , MRN: [id number] , ADDRESS: [id number] [place] [address] PCP [name], MD Patient Care Team: [name], MD as PCP - General (Internal Medicine) Belva Crome, MD as PCP - Cardiology (Cardiology) Daryll Brod, MD as Consulting Physician (Orthopedic Surgery) [name], DO as Consulting Physician (Neurology)  This Provider for this visit: Treatment Team:  Attending Provider: Clayton Bibles, NP    06/16/2021 -   Chief Complaint  Patient presents with   Follow-up    Pt states she is still having complaints of SOB. Has an occasional cough mainly at night but not of any concern. Denies any complaints of wheezing.   Female never smoker followed for mild persistent asthma  History of diastolic dysfunction  Known chronic right middle lobe collapse/ lymphadenopathy -s/p 07/2007 FOB +psuedomonas - MEdiastinaoscopy 07/27/2009 - Benign node of 4R and 4L. ANthracotoic pigment +. No granuoma. Non malignant  - last cxr 2019 and clear  HPI Anne [name] 89 y.o. -presents for an acute visit.  I personally saw her in February 2022 for her mild asthma and she was stable.  After that she saw a nurse practitioner Anne [name] in December 2022 for congestion over 3 days and was given 3 days prednisone.  Meanwhile she is continued her Breo.  However she tells me that ever since Thanksgiving 2022 she has been dealing with congestion and chest that she describes it as a burning and a pain.  Its not present daily.  It is present intermittently on and off.  Definitely she feels different.  Yet at the same time she states she has no wheezing or cough.  She does have some paroxysmal nocturnal dyspnea.  The most recent one was last night but she is unable to  quantify how severe this is.  She also admits to acid reflux but then she says this is on and off.  She is not able to tell me if this is worse than before.  HPI Chief Complaint  Patient presents with   Acute Visit    cough     Referring provider: [name], MD  HPI: 89 year old female never smoker followed for mild persistent asthma History of diastolic dysfunction Known chronic right middle lobe collapse/ lymphadenopathy -s/p 07/2007 FOB +psuedomonas   Test Exhaled nitric oxide test May 2018: 17 ppb High resolution CT chest August 2016 showed no evidence of interstitial lung disease, chronic collapse of right middle lobe MEdiastinaoscopy 07/27/2009 - Benign node of 4R and 4L. ANthracotoic pigment +. No granuoma. Non malignant PFT 10/28/2014 FEV1 94, ratio 54, FVC 135, no significant bronchodilator response, mid flows reversibility, DLCO 65%    04/25/2017 Acute OV : Asthma  Patient presents for an acute office visit.  She complains of 3 weeks of cough ,congestion sinus congestion, drainage , wheezing . Seen PCP dx w/ bronchitis , rx  , Doxycycline and Prednisone . Feels some better but still has chest congestion and tightness .  She remains on Breo daily.    Appetite is okay w/ no nv/d.  No chest pain, orthopnea or ede   OV 06/05/2017  Chief Complaint  Patient presents with   Follow-up    Pt states she has been doing good since last visit. Pt states she does become SOB with exertion. Denies any cough or CP.    Fu Mild persiostent asthma with associated diastolic dysfunction  Contines on broe. Overall stable. But last  4 month has has had 2 surgeries for hammer toe on left food. So less active. Correlating with this more dyspnea on exertion than usual. Also, found to have mold in the bathroom and professionally removed . Has not affected her but she is asking for feno and walk test both of which aare normal. She wants sample breo       OV 12/17/2017  Subjective:  Patient ID: Anne [name], female , DOB: [birth year] , age 14 y.o. , MRN: [id number] , ADDRESS: [address] [place]  [address]   12/17/2017 -   Chief Complaint  Patient presents with   Follow-up    Pt states she has been doing well since last visit and denies any complaints.   Fu Mild persiostent asthma with associated diastolic dysfunction  HPI Anne [name] 89 y.o. -  Mild persistent asthma follow-up. Recently she was switched to Brio on Qvar. This is helped his symptoms. In addition she is in the mold in the house.She feels great. She is somewhat she has done well. No interim asthma exacerbations. She will have a high dose flu shot today. There are really no other issues other than some mild recurrence of costochondral chest pain in the last few days is a recurrent problem for her.     Results for Anne [name], Anne [name] (MRN [id number]) as of 06/05/2017 14:14  Ref. Range 08/07/2016 12:01 02/12/2017 11:30 06/05/2017 13:04  Nitric Oxide Unknown '17 18 20   '$ OV 01/15/2019  Subjective:  Patient ID: Anne [name], female , DOB: November 23, 1930 , age 60 y.o. , MRN: [id number] , ADDRESS: [place] [address]   01/15/2019 -   Chief Complaint  Patient presents with   Follow-up    Pt states her breathing is doing well. Pt denies any increase in SOB, cough, CP/tightness, f/c/s, body aches.    89 year old female never smoker followed for mild persistent asthma History of diastolic dysfunction Known chronic right middle lobe collapse/ lymphadenopathy -  HPI Chief Complaint  Patient presents with   Acute Visit    cough     Referring provider: [name], MD  HPI: 89 year old female never smoker followed for mild persistent asthma History of diastolic dysfunction Known chronic right middle lobe collapse/ lymphadenopathy -s/p 07/2007 FOB +psuedomonas   Test Exhaled nitric oxide test May 2018: 17 ppb High resolution CT chest August 2016 showed no evidence of interstitial lung disease, chronic collapse of right middle lobe MEdiastinaoscopy 07/27/2009 - Benign node of 4R and 4L. ANthracotoic pigment +. No granuoma. Non malignant PFT 10/28/2014 FEV1 94, ratio 54, FVC 135, no significant bronchodilator response, mid flows reversibility, DLCO 65%    04/25/2017 Acute OV : Asthma  Patient presents for an acute office visit.  She complains of 3 weeks of cough ,congestion sinus congestion, drainage , wheezing . Seen PCP dx w/ bronchitis , rx  , Doxycycline and Prednisone . Feels some better but still has chest congestion and tightness .  She remains on Breo daily.    Appetite is okay w/ no nv/d.  No chest pain, orthopnea or ede   OV 06/05/2017  Chief Complaint  Patient presents with   Follow-up    Pt states she has been doing good since last visit. Pt states she does become SOB with exertion. Denies any cough or CP.    Fu Mild persiostent asthma with associated diastolic dysfunction  Contines on broe. Overall stable. But last  4 month has has had 2 surgeries for hammer toe on left food. So less active. Correlating with this more dyspnea on exertion than usual. Also, found to have mold in the bathroom and professionally removed . Has not affected her but she is asking for feno and walk test both of which aare normal. She wants sample breo       OV 12/17/2017  Subjective:  Patient ID: Anne [name], female , DOB: [birth year] , age 14 y.o. , MRN: [id number] , ADDRESS: [address] [place]  [address]   12/17/2017 -   Chief Complaint  Patient presents with   Follow-up    Pt states she has been doing well since last visit and denies any complaints.   Fu Mild persiostent asthma with associated diastolic dysfunction  HPI Anne [name] 89 y.o. -  Mild persistent asthma follow-up. Recently she was switched to Brio on Qvar. This is helped his symptoms. In addition she is in the mold in the house.She feels great. She is somewhat she has done well. No interim asthma exacerbations. She will have a high dose flu shot today. There are really no other issues other than some mild recurrence of costochondral chest pain in the last few days is a recurrent problem for her.     Results for Anne [name], Anne [name] (MRN [id number]) as of 06/05/2017 14:14  Ref. Range 08/07/2016 12:01 02/12/2017 11:30 06/05/2017 13:04  Nitric Oxide Unknown '17 18 20   '$ OV 01/15/2019  Subjective:  Patient ID: Anne [name], female , DOB: November 23, 1930 , age 60 y.o. , MRN: [id number] , ADDRESS: [place] [address]   01/15/2019 -   Chief Complaint  Patient presents with   Follow-up    Pt states her breathing is doing well. Pt denies any increase in SOB, cough, CP/tightness, f/c/s, body aches.    89 year old female never smoker followed for mild persistent asthma History of diastolic dysfunction Known chronic right middle lobe collapse/ lymphadenopathy -  No results found.    07/14/2021: Today-follow-up Patient presents today  for follow-up after pulmonary function testing. She has a very mild obstructive airway disease with increased lung volumes and without significant bronchodilator response. No evidence of restriction. Today, she reports feeling well. Improved since last visit. Still having some SOB upon exertion, progressive over the past year. Very minimal wheeze at time. Cough has improved. Denies any leg swelling, orthopnea, PND, chest pain. She continues on Waverly daily. She did receive a letter from her insurance company who said they do not cover Breo anymore. She rarely has to use albuterol. Continues on Zyrtec daily for allergies, which are well controlled.    Latest Reference Range & Units 06/16/21 10:12  Sheep Sorrel IgE kU/L <0.10  Pecan/Hickory Tree IgE kU/L 0.11 (H)  IgE (Immunoglobulin E), Serum <OR=114 kU/L <OR=114 kU/L 314 (H) 288 (H)  Allergen, D pternoyssinus,d7 kU/L <0.10  Cat Dander kU/L <0.10  Dog Dander kU/L <0.10  [place] Grass kU/L <0.10  Johnson Grass kU/L <0.10  Timothy Grass kU/L <0.10  Cockroach kU/L <0.10  Aspergillus fumigatus, m3 kU/L 0.11 (H)  Allergen, Comm Silver Wendee Copp, t9 kU/L <0.10  Allergen, Cottonwood, t14 kU/L 0.16 (H)  Elm IgE kU/L 0.16 (H)  Allergen, Mulberry, t76 kU/L <0.10  Allergen, Oak,t7 kU/L 0.10 (H)  COMMON RAGWEED (SHORT) (W1) IGE kU/L <0.10  Allergen, Mouse Urine Protein, e78 kU/L <0.10  D. farinae kU/L <0.10  Allergen, Cedar tree, t12 kU/L <0.10  Box Elder IgE kU/L <0.10  Rough Pigweed  IgE kU/L <0.10  (H): Data is abnormally high   OV 09/05/2021  Subjective:  Patient ID: Anne [name], female , DOB: 10/28/1930 , age 7 y.o. , MRN: [id number] , ADDRESS: [address] [place] [address] PCP [name], MD Patient Care Team: [name], MD as PCP - General (Internal Medicine) Belva Crome, MD as PCP - Cardiology (Cardiology) Daryll Brod, MD as Consulting Physician (Orthopedic Surgery) [name], DO as Consulting Physician  (Neurology)  This Provider for this visit: Treatment Team:  Attending Provider: Brand Males, MD    09/05/2021 -   Chief Complaint  Patient presents with   Follow-up    Pt states she has been doing okay since last visit.    HPI Anne [name] 89 y.o. -presents for follow-up.  After last visit because of worsening dyspnea on exertion we ordered RAST allergy panel.  This shows slightly high IgE level and some positivity to environmental allergies.  Presents for follow-up.  She then saw nurse practitioner.  Who started on Trelegy but she called back saying this did not help.  At this point in time she states that she is back to baseline but she still wanting to know if I recommended Trelegy.  I did advise that there is no point in trying Trelegy again.  Explained to her what Trelegy is compared to [facility].  Explained that because she did not feel better she is unlikely to feel better again with a rechallenge.  Explained the multifactorial nature of her dyspnea.  Did explain to her positive allergies.  We did discuss that be options such as Xolair if she were to get worse and having recurrent exacerbations or even Singulair.  OV 05/01/2022  Subjective:  Patient ID: Anne [name], female , DOB: 30-May-1930 , age 61 y.o. , MRN: [id number] , ADDRESS: [address] [place] [address] PCP Charlane Ferretti, MD Patient Care Team: Charlane Ferretti, MD as PCP -  No results found.    07/14/2021: Today-follow-up Patient presents today  for follow-up after pulmonary function testing. She has a very mild obstructive airway disease with increased lung volumes and without significant bronchodilator response. No evidence of restriction. Today, she reports feeling well. Improved since last visit. Still having some SOB upon exertion, progressive over the past year. Very minimal wheeze at time. Cough has improved. Denies any leg swelling, orthopnea, PND, chest pain. She continues on Waverly daily. She did receive a letter from her insurance company who said they do not cover Breo anymore. She rarely has to use albuterol. Continues on Zyrtec daily for allergies, which are well controlled.    Latest Reference Range & Units 06/16/21 10:12  Sheep Sorrel IgE kU/L <0.10  Pecan/Hickory Tree IgE kU/L 0.11 (H)  IgE (Immunoglobulin E), Serum <OR=114 kU/L <OR=114 kU/L 314 (H) 288 (H)  Allergen, D pternoyssinus,d7 kU/L <0.10  Cat Dander kU/L <0.10  Dog Dander kU/L <0.10  [place] Grass kU/L <0.10  Johnson Grass kU/L <0.10  Timothy Grass kU/L <0.10  Cockroach kU/L <0.10  Aspergillus fumigatus, m3 kU/L 0.11 (H)  Allergen, Comm Silver Wendee Copp, t9 kU/L <0.10  Allergen, Cottonwood, t14 kU/L 0.16 (H)  Elm IgE kU/L 0.16 (H)  Allergen, Mulberry, t76 kU/L <0.10  Allergen, Oak,t7 kU/L 0.10 (H)  COMMON RAGWEED (SHORT) (W1) IGE kU/L <0.10  Allergen, Mouse Urine Protein, e78 kU/L <0.10  D. farinae kU/L <0.10  Allergen, Cedar tree, t12 kU/L <0.10  Box Elder IgE kU/L <0.10  Rough Pigweed  IgE kU/L <0.10  (H): Data is abnormally high   OV 09/05/2021  Subjective:  Patient ID: Anne [name], female , DOB: 10/28/1930 , age 7 y.o. , MRN: [id number] , ADDRESS: [address] [place] [address] PCP [name], MD Patient Care Team: [name], MD as PCP - General (Internal Medicine) Belva Crome, MD as PCP - Cardiology (Cardiology) Daryll Brod, MD as Consulting Physician (Orthopedic Surgery) [name], DO as Consulting Physician  (Neurology)  This Provider for this visit: Treatment Team:  Attending Provider: Brand Males, MD    09/05/2021 -   Chief Complaint  Patient presents with   Follow-up    Pt states she has been doing okay since last visit.    HPI Anne [name] 89 y.o. -presents for follow-up.  After last visit because of worsening dyspnea on exertion we ordered RAST allergy panel.  This shows slightly high IgE level and some positivity to environmental allergies.  Presents for follow-up.  She then saw nurse practitioner.  Who started on Trelegy but she called back saying this did not help.  At this point in time she states that she is back to baseline but she still wanting to know if I recommended Trelegy.  I did advise that there is no point in trying Trelegy again.  Explained to her what Trelegy is compared to [facility].  Explained that because she did not feel better she is unlikely to feel better again with a rechallenge.  Explained the multifactorial nature of her dyspnea.  Did explain to her positive allergies.  We did discuss that be options such as Xolair if she were to get worse and having recurrent exacerbations or even Singulair.  OV 05/01/2022  Subjective:  Patient ID: Anne [name], female , DOB: 30-May-1930 , age 61 y.o. , MRN: [id number] , ADDRESS: [address] [place] [address] PCP Charlane Ferretti, MD Patient Care Team: Charlane Ferretti, MD as PCP -  wants refills on her inhalers.  She is social distancing, masking and has had a vaccines.  She continues to be active.  ACT score is 25 and shows good asthma control.    Asthma Control Test ACT Total Score  05/06/2020 25       03/09/2021 Follow up : Asthma  Patient complains of 3 days of increased cough and congestion. Mucus is thick and hard to get up but remains clear. Remains active. Drives and lives alone at home . Has some mild sinus drainage  Remains on BREO daily . No increased Albuterol inhaler use. No wheezing .  No fever,edema , abd pain , n/v/d. Does have some epigastric burning from time to time. No dysphagia.   Recent cardiac workup last month . Echo 02/20/21 showed EF 60%, Grade 1 DD ,Normal PAP  Chest xray nad Coronary CT with no lung issues , felt CAD stable -no significant stenosis per cards note.  Placed on PPI from PCP but has not started. Cardiac monitor to be placed.  TEST/EVENTS :  Exhaled nitric oxide test May 2018: 17 ppb High resolution CT chest August 2016 showed no evidence of interstitial lung disease, chronic collapse of right middle lobe MEdiastinaoscopy 07/27/2009 - Benign node of 4R and 4L. ANthracotoic pigment +. No granuoma. Non  malignant PFT 10/28/2014 FEV1 94, ratio 54, FVC 135, no significant bronchodilator response, mid flows reversibility, DLCO 65%  OV 06/16/2021  Subjective:  Patient ID: Anne [name], female , DOB: 1930/12/06 , age 89 y.o. , MRN: [id number] , ADDRESS: [address] [place] [address] PCP [name], MD Patient Care Team: [name], MD as PCP - General (Internal Medicine) Belva Crome, MD as PCP - Cardiology (Cardiology) Daryll Brod, MD as Consulting Physician (Orthopedic Surgery) [name], DO as Consulting Physician (Neurology)  This Provider for this visit: Treatment Team:  Attending Provider: Clayton Bibles, NP    06/16/2021 -   Chief Complaint  Patient presents with   Follow-up    Pt states she is still having complaints of SOB. Has an occasional cough mainly at night but not of any concern. Denies any complaints of wheezing.   Female never smoker followed for mild persistent asthma  History of diastolic dysfunction  Known chronic right middle lobe collapse/ lymphadenopathy -s/p 07/2007 FOB +psuedomonas - MEdiastinaoscopy 07/27/2009 - Benign node of 4R and 4L. ANthracotoic pigment +. No granuoma. Non malignant  - last cxr 2019 and clear  HPI Anne [name] 89 y.o. -presents for an acute visit.  I personally saw her in February 2022 for her mild asthma and she was stable.  After that she saw a nurse practitioner Anne [name] in December 2022 for congestion over 3 days and was given 3 days prednisone.  Meanwhile she is continued her Breo.  However she tells me that ever since Thanksgiving 2022 she has been dealing with congestion and chest that she describes it as a burning and a pain.  Its not present daily.  It is present intermittently on and off.  Definitely she feels different.  Yet at the same time she states she has no wheezing or cough.  She does have some paroxysmal nocturnal dyspnea.  The most recent one was last night but she is unable to  quantify how severe this is.  She also admits to acid reflux but then she says this is on and off.  She is not able to tell me if this is worse than before.  No results found.    07/14/2021: Today-follow-up Patient presents today  for follow-up after pulmonary function testing. She has a very mild obstructive airway disease with increased lung volumes and without significant bronchodilator response. No evidence of restriction. Today, she reports feeling well. Improved since last visit. Still having some SOB upon exertion, progressive over the past year. Very minimal wheeze at time. Cough has improved. Denies any leg swelling, orthopnea, PND, chest pain. She continues on Waverly daily. She did receive a letter from her insurance company who said they do not cover Breo anymore. She rarely has to use albuterol. Continues on Zyrtec daily for allergies, which are well controlled.    Latest Reference Range & Units 06/16/21 10:12  Sheep Sorrel IgE kU/L <0.10  Pecan/Hickory Tree IgE kU/L 0.11 (H)  IgE (Immunoglobulin E), Serum <OR=114 kU/L <OR=114 kU/L 314 (H) 288 (H)  Allergen, D pternoyssinus,d7 kU/L <0.10  Cat Dander kU/L <0.10  Dog Dander kU/L <0.10  [place] Grass kU/L <0.10  Johnson Grass kU/L <0.10  Timothy Grass kU/L <0.10  Cockroach kU/L <0.10  Aspergillus fumigatus, m3 kU/L 0.11 (H)  Allergen, Comm Silver Wendee Copp, t9 kU/L <0.10  Allergen, Cottonwood, t14 kU/L 0.16 (H)  Elm IgE kU/L 0.16 (H)  Allergen, Mulberry, t76 kU/L <0.10  Allergen, Oak,t7 kU/L 0.10 (H)  COMMON RAGWEED (SHORT) (W1) IGE kU/L <0.10  Allergen, Mouse Urine Protein, e78 kU/L <0.10  D. farinae kU/L <0.10  Allergen, Cedar tree, t12 kU/L <0.10  Box Elder IgE kU/L <0.10  Rough Pigweed  IgE kU/L <0.10  (H): Data is abnormally high   OV 09/05/2021  Subjective:  Patient ID: Anne [name], female , DOB: 10/28/1930 , age 7 y.o. , MRN: [id number] , ADDRESS: [address] [place] [address] PCP [name], MD Patient Care Team: [name], MD as PCP - General (Internal Medicine) Belva Crome, MD as PCP - Cardiology (Cardiology) Daryll Brod, MD as Consulting Physician (Orthopedic Surgery) [name], DO as Consulting Physician  (Neurology)  This Provider for this visit: Treatment Team:  Attending Provider: Brand Males, MD    09/05/2021 -   Chief Complaint  Patient presents with   Follow-up    Pt states she has been doing okay since last visit.    HPI Anne [name] 89 y.o. -presents for follow-up.  After last visit because of worsening dyspnea on exertion we ordered RAST allergy panel.  This shows slightly high IgE level and some positivity to environmental allergies.  Presents for follow-up.  She then saw nurse practitioner.  Who started on Trelegy but she called back saying this did not help.  At this point in time she states that she is back to baseline but she still wanting to know if I recommended Trelegy.  I did advise that there is no point in trying Trelegy again.  Explained to her what Trelegy is compared to [facility].  Explained that because she did not feel better she is unlikely to feel better again with a rechallenge.  Explained the multifactorial nature of her dyspnea.  Did explain to her positive allergies.  We did discuss that be options such as Xolair if she were to get worse and having recurrent exacerbations or even Singulair.  OV 05/01/2022  Subjective:  Patient ID: Anne [name], female , DOB: 30-May-1930 , age 61 y.o. , MRN: [id number] , ADDRESS: [address] [place] [address] PCP Charlane Ferretti, MD Patient Care Team: Charlane Ferretti, MD as PCP -  No results found.    07/14/2021: Today-follow-up Patient presents today  for follow-up after pulmonary function testing. She has a very mild obstructive airway disease with increased lung volumes and without significant bronchodilator response. No evidence of restriction. Today, she reports feeling well. Improved since last visit. Still having some SOB upon exertion, progressive over the past year. Very minimal wheeze at time. Cough has improved. Denies any leg swelling, orthopnea, PND, chest pain. She continues on [place] daily. She did receive a letter from her insurance company who said they do not cover Breo anymore. She rarely has to use albuterol. Continues on Zyrtec daily for allergies, which are well controlled.    Latest Reference Range & Units 06/16/21 10:12  Sheep Sorrel IgE kU/L <0.10  Pecan/Hickory Tree IgE kU/L 0.11 (H)  IgE (Immunoglobulin E), Serum <OR=114 kU/L <OR=114 kU/L 314 (H) 288 (H)  Allergen, D pternoyssinus,d7 kU/L <0.10  Cat Dander kU/L <0.10  Dog Dander kU/L <0.10  [place] Grass kU/L <0.10  Johnson Grass kU/L <0.10  Timothy Grass kU/L <0.10  Cockroach kU/L <0.10  Aspergillus fumigatus, m3 kU/L 0.11 (H)  Allergen, Comm Silver Wendee Copp, t9 kU/L <0.10  Allergen, Cottonwood, t14 kU/L 0.16 (H)  Elm IgE kU/L 0.16 (H)  Allergen, Mulberry, t76 kU/L <0.10  Allergen, Oak,t7 kU/L 0.10 (H)  COMMON RAGWEED (SHORT) (W1) IGE kU/L <0.10  Allergen, Mouse Urine Protein, e78 kU/L <0.10  D. farinae kU/L <0.10  Allergen, Cedar tree, t12 kU/L <0.10  Box Elder IgE kU/L <0.10  Rough Pigweed  IgE kU/L <0.10  (H): Data is abnormally high   OV 09/05/2021  Subjective:  Patient ID: Anne [name], female , DOB: 10/28/1930 , age 7 y.o. , MRN: [id number] , ADDRESS: [address] [place] [address] PCP [name], MD Patient Care Team: [name], MD as PCP - General (Internal Medicine) Belva Crome, MD as PCP - Cardiology (Cardiology) Daryll Brod, MD as Consulting Physician (Orthopedic Surgery) [name], DO as Consulting Physician  (Neurology)  This Provider for this visit: Treatment Team:  Attending Provider: Brand Males, MD    09/05/2021 -   Chief Complaint  Patient presents with   Follow-up    Pt states she has been doing okay since last visit.    HPI Anne [name] 89 y.o. -presents for follow-up.  After last visit because of worsening dyspnea on exertion we ordered RAST allergy panel.  This shows slightly high IgE level and some positivity to environmental allergies.  Presents for follow-up.  She then saw nurse practitioner.  Who started on Trelegy but she called back saying this did not help.  At this point in time she states that she is back to baseline but she still wanting to know if I recommended Trelegy.  I did advise that there is no point in trying Trelegy again.  Explained to her what Trelegy is compared to [facility].  Explained that because she did not feel better she is unlikely to feel better again with a rechallenge.  Explained the multifactorial nature of her dyspnea.  Did explain to her positive allergies.  We did discuss that be options such as Xolair if she were to get worse and having recurrent exacerbations or even Singulair.  OV 05/01/2022  Subjective:  Patient ID: Anne [name], female , DOB: 30-May-1930 , age 61 y.o. , MRN: [id number] , ADDRESS: [address] [place] [address] PCP Charlane Ferretti, MD Patient Care Team: Charlane Ferretti, MD as PCP -

## 2022-05-18 ENCOUNTER — Ambulatory Visit: Payer: Medicare PPO | Admitting: Cardiology

## 2022-07-04 ENCOUNTER — Encounter (HOSPITAL_BASED_OUTPATIENT_CLINIC_OR_DEPARTMENT_OTHER): Payer: Self-pay

## 2022-07-04 ENCOUNTER — Emergency Department (HOSPITAL_BASED_OUTPATIENT_CLINIC_OR_DEPARTMENT_OTHER)
Admission: EM | Admit: 2022-07-04 | Discharge: 2022-07-04 | Disposition: A | Discharge status: Home / Self Care | Payer: Medicare PPO | Attending: Emergency Medicine | Admitting: Emergency Medicine

## 2022-07-04 ENCOUNTER — Emergency Department (HOSPITAL_BASED_OUTPATIENT_CLINIC_OR_DEPARTMENT_OTHER): Payer: Medicare PPO | Admitting: Radiology

## 2022-07-04 ENCOUNTER — Other Ambulatory Visit: Payer: Self-pay

## 2022-07-04 DIAGNOSIS — Z7982 Long term (current) use of aspirin: Secondary | ICD-10-CM | POA: Insufficient documentation

## 2022-07-04 DIAGNOSIS — Z91040 Latex allergy status: Secondary | ICD-10-CM | POA: Diagnosis not present

## 2022-07-04 DIAGNOSIS — J45909 Unspecified asthma, uncomplicated: Secondary | ICD-10-CM | POA: Diagnosis not present

## 2022-07-04 DIAGNOSIS — Z7951 Long term (current) use of inhaled steroids: Secondary | ICD-10-CM | POA: Diagnosis not present

## 2022-07-04 DIAGNOSIS — R0602 Shortness of breath: Secondary | ICD-10-CM | POA: Diagnosis not present

## 2022-07-04 DIAGNOSIS — J4521 Mild intermittent asthma with (acute) exacerbation: Secondary | ICD-10-CM | POA: Insufficient documentation

## 2022-07-04 DIAGNOSIS — Z1152 Encounter for screening for COVID-19: Secondary | ICD-10-CM | POA: Diagnosis not present

## 2022-07-04 LAB — CBC WITH DIFFERENTIAL/PLATELET
Abs Immature Granulocytes: 0.02 10*3/uL (ref 0.00–0.07)
Basophils Absolute: 0 10*3/uL (ref 0.0–0.1)
Basophils Relative: 0 %
Eosinophils Absolute: 0.1 10*3/uL (ref 0.0–0.5)
Eosinophils Relative: 1 %
HCT: 37 % (ref 36.0–46.0)
Hemoglobin: 12 g/dL (ref 12.0–15.0)
Immature Granulocytes: 0 %
Lymphocytes Relative: 32 %
Lymphs Abs: 2.4 10*3/uL (ref 0.7–4.0)
MCH: 30.4 pg (ref 26.0–34.0)
MCHC: 32.4 g/dL (ref 30.0–36.0)
MCV: 93.7 fL (ref 80.0–100.0)
Monocytes Absolute: 0.9 10*3/uL (ref 0.1–1.0)
Monocytes Relative: 12 %
Neutro Abs: 4.2 10*3/uL (ref 1.7–7.7)
Neutrophils Relative %: 55 %
Platelets: 271 10*3/uL (ref 150–400)
RBC: 3.95 MIL/uL (ref 3.87–5.11)
RDW: 13.1 % (ref 11.5–15.5)
WBC: 7.7 10*3/uL (ref 4.0–10.5)
nRBC: 0 % (ref 0.0–0.2)

## 2022-07-04 LAB — BASIC METABOLIC PANEL
Anion gap: 9 (ref 5–15)
BUN: 33 mg/dL — ABNORMAL HIGH (ref 8–23)
CO2: 23 mmol/L (ref 22–32)
Calcium: 9.6 mg/dL (ref 8.9–10.3)
Chloride: 106 mmol/L (ref 98–111)
Creatinine, Ser: 0.97 mg/dL (ref 0.44–1.00)
GFR, Estimated: 56 mL/min — ABNORMAL LOW (ref >60)
Glucose, Bld: 82 mg/dL (ref 70–99)
Potassium: 4.5 mmol/L (ref 3.5–5.1)
Sodium: 138 mmol/L (ref 135–145)

## 2022-07-04 LAB — RESP PANEL BY RT-PCR (RSV, FLU A&B, COVID)  RVPGX2
Influenza A by PCR: NEGATIVE
Influenza B by PCR: NEGATIVE
Resp Syncytial Virus by PCR: NEGATIVE
SARS Coronavirus 2 by RT PCR: NEGATIVE

## 2022-07-04 LAB — TROPONIN I (HIGH SENSITIVITY): Troponin I (High Sensitivity): 5 ng/L (ref <18)

## 2022-07-04 MED ORDER — ALBUTEROL SULFATE (2.5 MG/3ML) 0.083% IN NEBU
2.5000 mg | INHALATION_SOLUTION | Freq: Once | RESPIRATORY_TRACT | Status: AC
  Administered 2022-07-04: 2.5 mg via RESPIRATORY_TRACT
  Filled 2022-07-04: qty 3

## 2022-07-04 MED ORDER — IPRATROPIUM-ALBUTEROL 0.5-2.5 (3) MG/3ML IN SOLN
3.0000 mL | Freq: Once | RESPIRATORY_TRACT | Status: AC
  Administered 2022-07-04: 3 mL via RESPIRATORY_TRACT
  Filled 2022-07-04: qty 3

## 2022-07-04 NOTE — ED Triage Notes (Signed)
Patient here POV from Home.  Endorses SOB that began Last PM related to Asthma. No Fever. Some nasal Drainage and Congestion.   Utilized Inhaler Last PM on two separate occasions which has been helpful.   NAD Noted during Triage. A&Ox4. GCS 15. Ambulatory.

## 2022-07-04 NOTE — Discharge Instructions (Signed)
Thank you for letting us take care of you today.  We treated you for an asthma exacerbation.  Your blood work, chest x-ray, and EKG today are reassuring and we do not see any problems with your heart or otherwise any issues with your workup today.  Please continue to take your medications at home as prescribed by your outpatient provider.  These follow-up with your PCP in the next 2 days to discuss your ED visit today.  If you develop any new or worsening symptoms such as severe shortness of breath, chest pain, lethargy, loss of consciousness, weakness on one side of her body, severe headache, or other new, concerning symptoms, please return to the nearest emergency department for reevaluation.

## 2022-07-04 NOTE — ED Provider Notes (Signed)
[facility] Provider Note   CSN: [id number] Arrival date & time: 07/04/22  1416     History  Chief Complaint  Patient presents with   Shortness of Breath    Anne [name] is a 89 y.o. female with past medical history asthma, arthritis, dyslipidemia, GERD who presents to the ED complaining of shortness of breath that started last night.  Patient reports that it feels similar to her asthma and she tried using her albuterol at home but did not have relief so came to the ED for further evaluation.  She notes a possible trigger as she received some flowers for the Easter holiday and has had increased time outdoors for the last week that she believes may have caused an asthma exacerbation.  She denies chest pain, diaphoresis, nausea, vomiting, diarrhea, fever, chills, cough, or congestion. She denies personal or family history of CAD/MI or heart failure. She denies leg pain or swelling, recent travel, recent surgery, or history of DVT/PE.       Home Medications Prior to Admission medications   Medication Sig Start Date End Date Taking? Authorizing Provider  albuterol (PROAIR HFA) 108 (90 Base) MCG/ACT inhaler INHALE 1-2 PUFFS INTO THE LUNGS EVERY 6 (SIX) HOURS AS NEEDED FOR WHEEZING OR SHORTNESS OF BREATH. 03/02/21   Brand Males, MD  amLODipine (NORVASC) 5 MG tablet Take 1 tablet by mouth daily.    [provider]  ASPIRIN LOW DOSE 81 MG tablet TAKE 1 TABLET (81 MG TOTAL) BY MOUTH DAILY. SWALLOW WHOLE. 02/09/22   Isaiah Serge, NP  BREO ELLIPTA 100-25 MCG/INH AEPB INHALE 1 PUFF BY MOUTH EVERY DAY 01/16/21   Brand Males, MD  Calcium Carb-Cholecalciferol 600-200 MG-UNIT TABS Take 1 tablet by mouth daily.    [provider]  cetirizine (ZYRTEC) 10 MG tablet Take 10 mg by mouth every morning.    [provider]  clotrimazole (MYCELEX) 10 MG troche Take 1 tablet (10 mg total) by mouth 5 (five) times daily.  03/16/22   Cobb, Karie Schwalbe, NP  dorzolamide-timolol (COSOPT) 2-0.5 % ophthalmic solution Place 1 drop into the right eye 2 (two) times daily. 03/14/22   [provider]  ferrous sulfate 325 (65 FE) MG tablet Take 325 mg by mouth 3 (three) times a week. Take on MWF  Take once a week reported on: 01/01/2022    [provider]  fluticasone (FLONASE) 50 MCG/ACT nasal spray Place 1 spray into both nostrils 2 (two) times daily as needed for allergies.     [provider]  gabapentin (NEURONTIN) 300 MG capsule Take 1 capsule (300 mg total) by mouth at bedtime. 01/01/22   Patel, Donika K, DO  latanoprost (XALATAN) 0.005 % ophthalmic solution Place 1 drop into both eyes at bedtime.    [provider]  metoprolol succinate (TOPROL-XL) 25 MG 24 hr tablet TAKE 1 TABLET (25 MG TOTAL) BY MOUTH DAILY. 03/06/22   Belva Crome, MD  Multiple Vitamin (MULTIVITAMIN) tablet Take 1 tablet by mouth every morning.     [provider]  nitroGLYCERIN (NITROSTAT) 0.4 MG SL tablet Place 1 tablet (0.4 mg total) under the tongue every 5 (five) minutes as needed for chest pain (Up to 3 doses in 15 minutes.). 03/20/22   Elgie Collard, PA-C  rosuvastatin (CRESTOR) 20 MG tablet TAKE 1 TABLET BY MOUTH EVERY DAY 04/24/22   Isaiah Serge, NP      Allergies    Other, Shellfish  allergy, Iodine, Latex, Pravastatin, and Adhesive [tape]    Review of Systems   Review of Systems  All other systems reviewed and are negative.   Physical Exam Updated Vital Signs BP 120/87   Pulse 66   Temp 98 F (36.7 C) (Oral)   Resp 18   Ht 5\' 7"  (1.702 m)   Wt 65.3 kg   SpO2 100%   BMI 22.55 kg/m  Physical Exam Vitals and nursing note reviewed.  Constitutional:      General: She is not in acute distress.    Appearance: Normal appearance. She is not ill-appearing, toxic-appearing or diaphoretic.  HENT:     Head: Normocephalic and atraumatic.     Mouth/Throat:     Mouth: Mucous membranes  are moist.     Pharynx: No pharyngeal swelling or oropharyngeal exudate.  Eyes:     Extraocular Movements: Extraocular movements intact.     Conjunctiva/sclera: Conjunctivae normal.     Pupils: Pupils are equal, round, and reactive to light.  Cardiovascular:     Rate and Rhythm: Normal rate and regular rhythm.     Heart sounds: No murmur heard.    No friction rub. No gallop.  Pulmonary:     Effort: Pulmonary effort is normal. No tachypnea, bradypnea, accessory muscle usage or respiratory distress.     Breath sounds: No stridor. Wheezing (minimal diffuse expiratory) present. No decreased breath sounds, rhonchi or rales.  Chest:     Chest wall: No mass, deformity, tenderness, crepitus or edema.  Abdominal:     General: Abdomen is flat.     Palpations: Abdomen is soft.     Tenderness: There is no abdominal tenderness.  Musculoskeletal:        General: Normal range of motion.     Cervical back: Normal range of motion and neck supple.     Right lower leg: No tenderness. No edema.     Left lower leg: No tenderness. No edema.  Skin:    General: Skin is warm and dry.     Capillary Refill: Capillary refill takes less than 2 seconds.  Neurological:     General: No focal deficit present.     Mental Status: She is alert and oriented to person, place, and time. Mental status is at baseline.     Cranial Nerves: No cranial nerve deficit.     Motor: No weakness.  Psychiatric:        Mood and Affect: Mood normal.        Behavior: Behavior normal.     ED Results / Procedures / Treatments   Labs (all labs ordered are listed, but only abnormal results are displayed) Labs Reviewed  BASIC METABOLIC PANEL - Abnormal; Notable for the following components:      Result Value   BUN 33 (*)    GFR, Estimated 56 (*)    All other components within normal limits  RESP PANEL BY RT-PCR (RSV, FLU A&B, COVID)  RVPGX2  CBC WITH DIFFERENTIAL/PLATELET  TROPONIN I (HIGH SENSITIVITY)    EKG Normal sinus  rhythm at 64 bpm, PVCs, no acute ST-T changes, no STEMI  Radiology DG Chest 2 View  Result Date: 07/04/2022 CLINICAL DATA:  Shortness of breath.  Asthma EXAM: CHEST - 2 VIEW COMPARISON:  X-ray 03/21/2022 FINDINGS: Hyperinflation. No consolidation, pneumothorax or effusion. Normal cardiopericardial silhouette. Calcified aorta. Overlapping cardiac leads. Degenerative changes are seen along the spine. Eventration of the left hemidiaphragm IMPRESSION: No acute cardiopulmonary disease.  Hyperinflation Electronically Signed  By: Jill Side M.D.   On: 07/04/2022 16:57    Procedures Procedures    Medications Ordered in ED Medications  albuterol (PROVENTIL) (2.5 MG/3ML) 0.083% nebulizer solution 2.5 mg (2.5 mg Nebulization Given 07/04/22 1605)  ipratropium-albuterol (DUONEB) 0.5-2.5 (3) MG/3ML nebulizer solution 3 mL (3 mLs Nebulization Given 07/04/22 1604)    ED Course/ Medical Decision Making/ A&P                             Medical Decision Making Amount and/or Complexity of Data Reviewed Labs: ordered. Decision-making details documented in ED Course. Radiology: ordered. Decision-making details documented in ED Course. ECG/medicine tests: ordered. Decision-making details documented in ED Course.   Medical Decision Making:   Samaree Kinner is a 89 y.o. female who presented to the ED today with shortness of breath detailed above.    Patient's presentation is complicated by their history of asthma, hypertension, hyperlipidemia, advanced age.  Patient placed on continuous vitals and telemetry monitoring while in ED which was reviewed periodically.  Complete initial physical exam performed, notably the patient  was in no acute distress.  She had minimal expiratory diffuse wheezing but no retractions, no tachypnea, no nasal flaring, no signs of respiratory distress.  Regular rate and rhythm.  No lower extremity edema or tenderness.   Abdomen soft and nontender.  Patient neurologically  intact. Reviewed and confirmed nursing documentation for past medical history, family history, social history.    Initial Assessment:   With the patient's presentation of shortness of breath, most likely diagnosis is asthma exacerbation. The causes for shortness of breath include but are not limited to Cardiac (AHF, pericardial effusion and tamponade, arrhythmias, ischemia, etc) Respiratory (COPD, asthma, pneumonia, pneumothorax, primary pulmonary hypertension, PE/VQ mismatch) Hematological (anemia) Neuromuscular (ALS, Guillain-Barr, etc)  This is most consistent with an acute complicated illness  Initial Plan:  Screening labs including CBC and Metabolic panel to evaluate for infectious or metabolic etiology of disease.  Viral swabs CXR to evaluate for structural/infectious intrathoracic pathology.  EKG and troponin to evaluate for cardiac pathology Objective evaluation as reviewed   Initial Study Results:   Laboratory  All laboratory results reviewed without evidence of clinically relevant pathology.    EKG EKG was reviewed independently. ST segments without concerns for elevations.   EKG: normal sinus rhythm, PVCs.   Radiology:  All images reviewed independently. Agree with radiology report at this time.   DG Chest 2 View  Result Date: 07/04/2022 CLINICAL DATA:  Shortness of breath.  Asthma EXAM: CHEST - 2 VIEW COMPARISON:  X-ray 03/21/2022 FINDINGS: Hyperinflation. No consolidation, pneumothorax or effusion. Normal cardiopericardial silhouette. Calcified aorta. Overlapping cardiac leads. Degenerative changes are seen along the spine. Eventration of the left hemidiaphragm IMPRESSION: No acute cardiopulmonary disease.  Hyperinflation Electronically Signed   By: Jill Side M.D.   On: 07/04/2022 16:57      Final Assessment and Plan:   This is an 89 year old female who presents to the ED complaining of shortness of breath onset last night.  Patient reports that symptoms consistent  with previous asthma exacerbations.  Possible allergen exposure as above.  Patient has minimal expiratory wheezing diffusely on initial exam.  She received 1 breathing treatment in the ED and reports active relief of symptoms following this.  On reassessment, lungs clear to auscultation.  Patient notes that she came to the ED to make sure she does not have a problem with her heart.  EKG  normal sinus rhythm with PVCs but no acute ST-T changes, no STEMI.  Troponin negative.  Symptoms overall have resolved.  Remainder of workup unremarkable and vital signs within normal limits.  Patient expresses desire to be discharged home.  Will discharge patient home with strict ED return precautions, primary care follow-up.  All questions answered and stable for discharge.   Clinical Impression:  1. Mild intermittent asthma with exacerbation      Discharge           Final Clinical Impression(s) / ED Diagnoses Final diagnoses:  Mild intermittent asthma with exacerbation    Rx / DC Orders ED Discharge Orders     None         Turner Daniels 07/04/22 Anola Gurney, MD 07/04/22 [phone]

## 2022-07-04 NOTE — ED Notes (Signed)
Patient verbalizes understanding of discharge instructions. Opportunity for questioning and answers were provided. Patient discharged from ED.  °

## 2022-07-12 DIAGNOSIS — M542 Cervicalgia: Secondary | ICD-10-CM | POA: Diagnosis not present

## 2022-07-12 DIAGNOSIS — J45901 Unspecified asthma with (acute) exacerbation: Secondary | ICD-10-CM | POA: Diagnosis not present

## 2022-07-19 DIAGNOSIS — H35033 Hypertensive retinopathy, bilateral: Secondary | ICD-10-CM | POA: Diagnosis not present

## 2022-07-19 DIAGNOSIS — H401121 Primary open-angle glaucoma, left eye, mild stage: Secondary | ICD-10-CM | POA: Diagnosis not present

## 2022-07-19 DIAGNOSIS — H401112 Primary open-angle glaucoma, right eye, moderate stage: Secondary | ICD-10-CM | POA: Diagnosis not present

## 2022-07-19 DIAGNOSIS — H524 Presbyopia: Secondary | ICD-10-CM | POA: Diagnosis not present

## 2022-07-22 ENCOUNTER — Other Ambulatory Visit: Payer: Self-pay

## 2022-07-22 ENCOUNTER — Emergency Department (HOSPITAL_COMMUNITY)
Admission: EM | Admit: 2022-07-22 | Discharge: 2022-07-22 | Disposition: A | Discharge status: Home / Self Care | Payer: Medicare PPO | Attending: Emergency Medicine | Admitting: Emergency Medicine

## 2022-07-22 ENCOUNTER — Emergency Department (HOSPITAL_COMMUNITY): Payer: Medicare PPO

## 2022-07-22 DIAGNOSIS — J45909 Unspecified asthma, uncomplicated: Secondary | ICD-10-CM | POA: Diagnosis not present

## 2022-07-22 DIAGNOSIS — Z7982 Long term (current) use of aspirin: Secondary | ICD-10-CM | POA: Diagnosis not present

## 2022-07-22 DIAGNOSIS — I4891 Unspecified atrial fibrillation: Secondary | ICD-10-CM | POA: Diagnosis not present

## 2022-07-22 DIAGNOSIS — Z79899 Other long term (current) drug therapy: Secondary | ICD-10-CM | POA: Diagnosis not present

## 2022-07-22 DIAGNOSIS — Z91040 Latex allergy status: Secondary | ICD-10-CM | POA: Insufficient documentation

## 2022-07-22 DIAGNOSIS — R059 Cough, unspecified: Secondary | ICD-10-CM | POA: Diagnosis not present

## 2022-07-22 DIAGNOSIS — Z7901 Long term (current) use of anticoagulants: Secondary | ICD-10-CM | POA: Insufficient documentation

## 2022-07-22 DIAGNOSIS — R0602 Shortness of breath: Secondary | ICD-10-CM | POA: Diagnosis present

## 2022-07-22 LAB — CBC
HCT: 42.3 % (ref 36.0–46.0)
Hemoglobin: 13.5 g/dL (ref 12.0–15.0)
MCH: 30.4 pg (ref 26.0–34.0)
MCHC: 31.9 g/dL (ref 30.0–36.0)
MCV: 95.3 fL (ref 80.0–100.0)
Platelets: 290 10*3/uL (ref 150–400)
RBC: 4.44 MIL/uL (ref 3.87–5.11)
RDW: 13.2 % (ref 11.5–15.5)
WBC: 8.2 10*3/uL (ref 4.0–10.5)
nRBC: 0 % (ref 0.0–0.2)

## 2022-07-22 LAB — BASIC METABOLIC PANEL
Anion gap: 10 (ref 5–15)
BUN: 27 mg/dL — ABNORMAL HIGH (ref 8–23)
CO2: 24 mmol/L (ref 22–32)
Calcium: 9.2 mg/dL (ref 8.9–10.3)
Chloride: 106 mmol/L (ref 98–111)
Creatinine, Ser: 1.11 mg/dL — ABNORMAL HIGH (ref 0.44–1.00)
GFR, Estimated: 48 mL/min — ABNORMAL LOW (ref >60)
Glucose, Bld: 82 mg/dL (ref 70–99)
Potassium: 4.1 mmol/L (ref 3.5–5.1)
Sodium: 140 mmol/L (ref 135–145)

## 2022-07-22 LAB — TROPONIN I (HIGH SENSITIVITY)
Troponin I (High Sensitivity): 17 ng/L (ref <18)
Troponin I (High Sensitivity): 18 ng/L — ABNORMAL HIGH (ref <18)

## 2022-07-22 LAB — TSH: TSH: 1.064 u[IU]/mL (ref 0.350–4.500)

## 2022-07-22 LAB — MAGNESIUM: Magnesium: 2.2 mg/dL (ref 1.7–2.4)

## 2022-07-22 MED ORDER — DEXAMETHASONE 4 MG PO TABS
10.0000 mg | ORAL_TABLET | Freq: Once | ORAL | Status: AC
  Administered 2022-07-22: 10 mg via ORAL
  Filled 2022-07-22: qty 1

## 2022-07-22 MED ORDER — APIXABAN 2.5 MG PO TABS: 2.5000 mg | ORAL_TABLET | Freq: Two times a day (BID) | ORAL | 0 refills | Status: DC

## 2022-07-22 NOTE — ED Provider Notes (Signed)
[place] EMERGENCY DEPARTMENT AT [facility] Provider Note   CSN: [id number] Arrival date & time: 07/22/22  1220     History  Chief Complaint  Patient presents with   Shortness of Breath    Anne [name] is a 89 y.o. female.   Shortness of Breath Patient was that shortness of breath and cough.  Has had for the last 3 weeks.  Mild sputum production.  Has been on antibiotics and has had albuterol.  Has not Zyrtec.  Seen by her PCP.  Headache she been seen in the ER around 3 weeks ago also.  Feeling more short of breath the last couple days.  Does not feel her heart beating differently but appears to be in A-fib which is new for her.    Past Medical History:  Diagnosis Date   Allergic rhinitis    Arthritis    Asthma    Atypical chest pain    Collapse of right lung    Dyslipidemia    Gastroesophageal reflux disease    Glaucoma    Hematuria    Hyperlipidemia    Lung nodules    PVC's (premature ventricular contractions) 02/21/2021   Sinus bradycardia 02/21/2021    Home Medications Prior to Admission medications   Medication Sig Start Date End Date Taking? Authorizing Provider  apixaban (ELIQUIS) 2.5 MG TABS tablet Take 1 tablet (2.5 mg total) by mouth 2 (two) times daily. 07/22/22 08/21/22 Yes Benjiman Core, MD  albuterol (PROAIR HFA) 108 (90 Base) MCG/ACT inhaler INHALE 1-2 PUFFS INTO THE LUNGS EVERY 6 (SIX) HOURS AS NEEDED FOR WHEEZING OR SHORTNESS OF BREATH. 03/02/21   Kalman Shan, MD  amLODipine (NORVASC) 5 MG tablet Take 1 tablet by mouth daily.    [provider]  ASPIRIN LOW DOSE 81 MG tablet TAKE 1 TABLET (81 MG TOTAL) BY MOUTH DAILY. SWALLOW WHOLE. 02/09/22   Leone Brand, NP  BREO ELLIPTA 100-25 MCG/INH AEPB INHALE 1 PUFF BY MOUTH EVERY DAY 01/16/21   Kalman Shan, MD  Calcium Carb-Cholecalciferol 600-200 MG-UNIT TABS Take 1 tablet by mouth daily.    [provider]  cetirizine (ZYRTEC) 10 MG tablet Take 10 mg by  mouth every morning.    [provider]  clotrimazole (MYCELEX) 10 MG troche Take 1 tablet (10 mg total) by mouth 5 (five) times daily. 03/16/22   Cobb, Ruby Cola, NP  dorzolamide-timolol (COSOPT) 2-0.5 % ophthalmic solution Place 1 drop into the right eye 2 (two) times daily. 03/14/22   [provider]  ferrous sulfate 325 (65 FE) MG tablet Take 325 mg by mouth 3 (three) times a week. Take on MWF  Take once a week reported on: 01/01/2022    [provider]  fluticasone (FLONASE) 50 MCG/ACT nasal spray Place 1 spray into both nostrils 2 (two) times daily as needed for allergies.     [provider]  gabapentin (NEURONTIN) 300 MG capsule Take 1 capsule (300 mg total) by mouth at bedtime. 01/01/22   Patel, Donika K, DO  latanoprost (XALATAN) 0.005 % ophthalmic solution Place 1 drop into both eyes at bedtime.    [provider]  metoprolol succinate (TOPROL-XL) 25 MG 24 hr tablet TAKE 1 TABLET (25 MG TOTAL) BY MOUTH DAILY. 03/06/22   Lyn Records, MD  Multiple Vitamin (MULTIVITAMIN) tablet Take 1 tablet by mouth every morning.     [provider]  nitroGLYCERIN (NITROSTAT) 0.4 MG SL tablet Place 1 tablet (0.4 mg total) under  the tongue every 5 (five) minutes as needed for chest pain (Up to 3 doses in 15 minutes.). 03/20/22   Sharlene Dory, PA-C  rosuvastatin (CRESTOR) 20 MG tablet TAKE 1 TABLET BY MOUTH EVERY DAY 04/24/22   Leone Brand, NP      Allergies    Other, Shellfish allergy, Iodine, Latex, Methocarbamol, Pravastatin, and Adhesive [tape]    Review of Systems   Review of Systems  Respiratory:  Positive for shortness of breath.     Physical Exam Updated Vital Signs BP 113/64   Pulse 87   Temp 98.1 F (36.7 C) (Oral)   Resp 16   SpO2 99%  Physical Exam Vitals and nursing note reviewed.  Cardiovascular:     Rate and Rhythm: Normal rate. Rhythm irregular.  Pulmonary:     Comments: Mildly harsh breath sounds without focal  rales or rhonchi. Chest:     Chest wall: No tenderness.  Abdominal:     Tenderness: There is no abdominal tenderness.  Musculoskeletal:     Cervical back: Neck supple.     Right lower leg: No tenderness.     Left lower leg: No tenderness.  Skin:    Capillary Refill: Capillary refill takes less than 2 seconds.  Neurological:     Mental Status: She is alert and oriented to person, place, and time.     ED Results / Procedures / Treatments   Labs (all labs ordered are listed, but only abnormal results are displayed) Labs Reviewed  CBC  BASIC METABOLIC PANEL  MAGNESIUM  TSH  TROPONIN I (HIGH SENSITIVITY)  TROPONIN I (HIGH SENSITIVITY)    EKG EKG Interpretation  Date/Time:  Sunday July 22 2022 12:30:50 EDT Ventricular Rate:  77 PR Interval:    QRS Duration: 75 QT Interval:  340 QTC Calculation: 380 R Axis:   70 Text Interpretation: Atrial fibrillation Anteroseptal infarct, old Minimal ST depression, inferior leads afib is new since last tracing Confirmed by Benjiman Core [phone]) on 07/22/2022 12:34:33 PM  Radiology DG Chest 2 View  Result Date: 07/22/2022 CLINICAL DATA:  Cough EXAM: CHEST - 2 VIEW COMPARISON:  X-ray 07/04/2022 and older FINDINGS: Hyperinflation. No consolidation, pneumothorax or effusion. No edema. Normal cardiopericardial silhouette with calcified aorta. Overlapping cardiac leads. Mild degenerative changes of the spine on lateral view. Eventration of the right hemidiaphragm IMPRESSION: Hyperinflation.  No acute cardiopulmonary disease Electronically Signed   By: Karen Kays M.D.   On: 07/22/2022 13:54    Procedures Procedures    Medications Ordered in ED Medications - No data to display  ED Course/ Medical Decision Making/ A&P                             Medical Decision Making Amount and/or Complexity of Data Reviewed Labs: ordered. Radiology: ordered.  Risk Prescription drug management.   Patient with URI symptoms with cough.  Some  shortness of breath.  Has already been treated.  However found to be on a new onset A-fib.  Not in RVR.  CHA2DS2-VASc of at least 4 due to age sex and hypertension.  Will get x-ray to evaluate for pneumonia.  Will get basic blood work.  No falls or GI bleeding.  Reviewed previous cardiology note.  Reviewed recent ER note also.  Will get basic blood work.  Chest x-ray reviewed and reassuring.  No pneumonia.  Will start on lower dose Eliquis with outpatient follow-up.  Care turned over to  Dr. Adela Lank.        Final Clinical Impression(s) / ED Diagnoses Final diagnoses:  Atrial fibrillation, unspecified type    Rx / DC Orders ED Discharge Orders          Ordered    apixaban (ELIQUIS) 2.5 MG TABS tablet  2 times daily        07/22/22 1456    Ambulatory referral to Cardiology       Comments: If you have not heard from the Cardiology office within the next 72 hours please call [phone].   07/22/22 1457              Benjiman Core, MD 07/22/22 1512

## 2022-07-22 NOTE — ED Triage Notes (Signed)
C/o productive cough with white sputum, sob, and congestion x3 weeks.  Denies cp Recent neb treatment on 4/11 with relief but then sob returned on 4/14. Pt was also prescribed abx on 4/11 Hx asthma Mucinex, zyrtec, and albuterol inhaler w/o relief

## 2022-07-22 NOTE — Discharge Instructions (Addendum)
Call the afib clinic tomorrow morning to see when they can see you in the office.  You can also call your cardiologist tomorrow.  We have ordered a blood thinning medication for you to reduce your risk of stroke.  Please return for worsening difficulty breathing or if you need to use your inhaler more often than every 4 hours when you are awake.

## 2022-07-22 NOTE — ED Provider Notes (Signed)
Received patient in turnover from Dr. Rubin Payor.  Please see their note for further details of Hx, PE.  Briefly patient is a 89 y.o. female with a Shortness of Breath .  Patient was found to be newly in atrial fibrillation.  She has had symptoms for weeks.  Rate controlled.  Plan for laboratory evaluation.  Patient with flat troponins.  No significant anemia, no significant electrolyte abnormalities.  I discussed the plan with the patient.  She would like to go home.  Will call the A-fib clinic in the morning.  CHA2DS2/VAS Stroke Risk Points  Current as of 14 minutes ago     5 >= 2 Points: High Risk  1 - 1.99 Points: Medium Risk  0 Points: Low Risk    No Change      Details    This score determines the patient's risk of having a stroke if the  patient has atrial fibrillation.       Points Metrics  0 Has Congestive Heart Failure:  No    Current as of 14 minutes ago  1 Has Vascular Disease:  Yes    Current as of 14 minutes ago  1 Has Hypertension:  Yes    Current as of 14 minutes ago  2 Age:  21    Current as of 14 minutes ago  0 Has Diabetes:  No    Current as of 14 minutes ago  0 Had Stroke:  No  Had TIA:  No  Had Thromboembolism:  No    Current as of 14 minutes ago  1 Female:  Yes    Current as of 14 minutes ago              Melene Plan, DO 07/22/22 1736

## 2022-07-23 ENCOUNTER — Telehealth: Payer: Self-pay | Admitting: Internal Medicine

## 2022-07-23 NOTE — ED Notes (Signed)
Opened chart to review meds with pt as requested.

## 2022-07-23 NOTE — Telephone Encounter (Signed)
Patient would like to know if she should be taking Mycelex. Does not have the medication. Pharmacy is [facility]. Patient phone number is [phone].

## 2022-07-24 NOTE — Telephone Encounter (Signed)
Called pt but unable to reach. Left message for her to return call. 

## 2022-07-25 ENCOUNTER — Other Ambulatory Visit: Payer: Self-pay | Admitting: Internal Medicine

## 2022-07-26 ENCOUNTER — Telehealth: Payer: Self-pay | Admitting: Internal Medicine

## 2022-07-26 ENCOUNTER — Other Ambulatory Visit: Payer: Self-pay | Admitting: Internal Medicine

## 2022-07-26 NOTE — Telephone Encounter (Signed)
Patient called to ask the doctor if she could switch inhaler to the Trelegy.  She stated that she used it before and would like to go back using it again.  Please advise and call patient to receive a script.  [facility] [address]  CB# [phone]

## 2022-07-27 NOTE — Telephone Encounter (Signed)
It is fine to switch to Trelegy low-dose

## 2022-07-27 NOTE — Telephone Encounter (Signed)
Called patient but she did not answer. Her VM was full.   Will attempt to call back.

## 2022-07-29 NOTE — Progress Notes (Signed)
Cardiology Office Note:    Date:  07/29/2022   ID:  Anne [name], DOB Dec 24, 1930, MRN [id number]  PCP:  Genelle Gather, DO   [facility] Providers Cardiologist:  Lesleigh Noe, MD (Inactive)     Referring MD: Genelle Gather, DO   Chief Complaint:   History of Present Illness:    Anne [name] is a  89 y.o. female with a hx of HLD, HTN, GERD, asthma, anxiety, COPD  She was initially seen by Dr. Katrinka Blazing in 2016 for chest discomfort.  She had been having dysphagia and postprandial chest discomfort.  Myoview 10/2014 was low risk.  CT performed that showed no abnormalities.  She presented to the ED 02/20/2021 with chest pain and intermittent dizziness.  EKG was completed and revealed PVCs with no ischemic changes.  2D echo was completed showing EF of 60 to 65%, no RWMA, grade 1 DD, normal RV function.  She underwent cardiac CT with FFR which revealed calcium score of 615 with 50 to 69% LAD with FFR verifying no significant stenosis.    She was seen by Dr. Katrinka Blazing on 06/05/2021 for CHF and chest pain follow-up.  She reported anxiety related to chest discomfort and expressed concerns about burning discomfort that occurs spontaneously as well as DOE.  Seen by Jari Favre, PA on 03/20/2022 and reported ongoing fatigue and shortness of breath.  2D echo was updated with normal EF and moderate TR and mild MR.  Seen by Robin Searing, NP, on 03/28/2022 for follow-up of echo results.  She reported tightness in her chest with increased activity.  This problem occurred when she ate seafood few weeks ago and had an allergic reaction.  Her echo results were reviewed and all questions were answered to her satisfaction.  She appeared euvolemic on exam.  ECG revealed sinus bradycardia with rate of 57 bpm.  She was recommended to follow-up with pulmonology for her shortness of breath and return to cardiology in 6 months.  ED admission 07/04/22 for shortness of breath.  She reported trying to use her  albuterol inhaler with no relief.  She thought symptoms were in response to flowers she had received and increasing time outdoors.  EKG revealed sinus rhythm with PVCs, no ST abnormalities. Hs trop negative at 5. Symptoms resolved and she was d/ced.   Return to ED on 07/22/22 with shortness of breath and cough x 3 weeks, mild sputum production.  Found to be in AF without RVR.  Troponins were flat (17 >>18), and she had no significant anemia or electrolyte abnormalities.  She was started on Eliquis 2.5 mg twice daily for CHA2DS2-VASc score of 4. HR was stable and she was maintained on metoprolol succinate 25 mg daily.   Today, she is here  Eliquis is wrong dose   Past Medical History:  Diagnosis Date   Allergic rhinitis    Arthritis    Asthma    Atypical chest pain    Collapse of right lung    Dyslipidemia    Gastroesophageal reflux disease    Glaucoma    Hematuria    Hyperlipidemia    Lung nodules    PVC's (premature ventricular contractions) 02/21/2021   Sinus bradycardia 02/21/2021    Past Surgical History:  Procedure Laterality Date   ANAL FISSURE REPAIR     BREAST BIOPSY Right    CATARACT EXTRACTION Bilateral    MASS EXCISION  04/11/2012   Procedure: EXCISION MASS;  Surgeon: Nicki Reaper,  MD;  Location: [facility];  Service: Orthopedics;  Laterality: Right;  Excision of mass right upper arm   MEDIASTINOSCOPY  4/11   NASAL SINUS SURGERY     TOTAL ABDOMINAL HYSTERECTOMY W/ BILATERAL SALPINGOOPHORECTOMY      Current Medications: No outpatient medications have been marked as taking for the 08/02/22 encounter (Appointment) with Lissa Hoard Zachary George, NP.     Allergies:   Other, Shellfish allergy, Iodine, Latex, Methocarbamol, Pravastatin, and Adhesive [tape]   Social History   Socioeconomic History   Marital status: Widowed    Spouse name: Not on file   Number of children: Not on file   Years of education: Not on file   Highest education level: Not on file   Occupational History   Occupation: retired  Tobacco Use   Smoking status: Former    Years: 4    Types: Cigarettes    Start date: Mar 09, 1952    Quit date: 1960    Years since quitting: 03/10/2063   Smokeless tobacco: Never   Tobacco comments:    social smoker  Building services engineer Use: Never used  Substance and Sexual Activity   Alcohol use: No    Alcohol/week: 0.0 standard drinks of alcohol   Drug use: No   Sexual activity: Never    Birth control/protection: Surgical  Other Topics Concern   Not on file  Social History Narrative   Retired Public relations account executive.  Lives alone, husband passed away in 09-Mar-2017.  No children.   Right handed   One level home   Social Determinants of Health   Financial Resource Strain: Not on file  Food Insecurity: Not on file  Transportation Needs: Not on file  Physical Activity: Not on file  Stress: Not on file  Social Connections: Not on file     Family History: The patient's family history includes Breast cancer in her cousin; Cancer in her mother; Heart disease in her father. There is no history of Heart attack.  ROS:   Please see the history of present illness.     All other systems reviewed and are negative.  Labs/Other Studies Reviewed:    The following studies were reviewed today:  Cardiac Studies & Procedures     STRESS TESTS  MYOCARDIAL PERFUSION IMAGING 06/09/2021  Narrative   The study is normal. The study is low risk.   No ST deviation was noted.   LV perfusion is normal. There is no evidence of ischemia. There is no evidence of infarction.   Left ventricular function is normal. Nuclear stress EF: 74 %. The left ventricular ejection fraction is hyperdynamic (>65%). End diastolic cavity size is normal. End systolic cavity size is normal.   Prior study available for comparison from 11/26/2014.  Fixed perfusion defect in apical inferior wall and apex with normal wall motion consistent with artifact Low risk study    ECHOCARDIOGRAM  ECHOCARDIOGRAM COMPLETE 03/21/2022   IMPRESSIONS   1. Left ventricular ejection fraction, by estimation, is 60 to 65%. The left ventricle has normal function. The left ventricle has no regional wall motion abnormalities. Left ventricular diastolic parameters were normal. The average left ventricular global longitudinal strain is -19.4 %. The global longitudinal strain is normal. 2. Right ventricular systolic function is normal. The right ventricular size is normal. There is mildly elevated pulmonary artery systolic pressure. The estimated right ventricular systolic pressure is 37.6 mmHg. 3. The mitral valve is normal in structure. Mild mitral valve regurgitation. No evidence of mitral  stenosis. 4. Tricuspid valve regurgitation is moderate. 5. The aortic valve is tricuspid. There is mild calcification of the aortic valve. There is mild thickening of the aortic valve. Aortic valve regurgitation is not visualized. Aortic valve sclerosis is present, with no evidence of aortic valve stenosis. 6. The inferior vena cava is normal in size with greater than 50% respiratory variability, suggesting right atrial pressure of 3 mmHg.  FINDINGS Left Ventricle: Left ventricular ejection fraction, by estimation, is 60 to 65%. The left ventricle has normal function. The left ventricle has no regional wall motion abnormalities. The average left ventricular global longitudinal strain is -19.4 %. The global longitudinal strain is normal. The left ventricular internal cavity size was normal in size. There is no left ventricular hypertrophy. Left ventricular diastolic parameters were normal.  Right Ventricle: The right ventricular size is normal. No increase in right ventricular wall thickness. Right ventricular systolic function is normal. There is mildly elevated pulmonary artery systolic pressure. The tricuspid regurgitant velocity is 2.94 m/s, and with an assumed right atrial pressure of 3 mmHg,  the estimated right ventricular systolic pressure is 37.6 mmHg.  Left Atrium: Left atrial size was normal in size.  Right Atrium: Right atrial size was normal in size.  Pericardium: Trivial pericardial effusion is present.  Mitral Valve: The mitral valve is normal in structure. Mild mitral valve regurgitation. No evidence of mitral valve stenosis.  Tricuspid Valve: The tricuspid valve is normal in structure. Tricuspid valve regurgitation is moderate . No evidence of tricuspid stenosis.  Aortic Valve: The aortic valve is tricuspid. There is mild calcification of the aortic valve. There is mild thickening of the aortic valve. Aortic valve regurgitation is not visualized. Aortic valve sclerosis is present, with no evidence of aortic valve stenosis.  Pulmonic Valve: The pulmonic valve was normal in structure. Pulmonic valve regurgitation is not visualized. No evidence of pulmonic stenosis.  Aorta: The aortic root is normal in size and structure.  Venous: The inferior vena cava is normal in size with greater than 50% respiratory variability, suggesting right atrial pressure of 3 mmHg.  IAS/Shunts: No atrial level shunt detected by color flow Doppler.      MONITORS  CARDIAC EVENT MONITOR 04/08/2021  Narrative  Basic rhythm is NSR and SB  PVC's are prevalent with burden of 1%  No atrial fibrillation or ventricular tachycardia.   CT SCANS  CT CORONARY MORPH W/CTA COR W/SCORE 02/21/2021  Addendum 02/21/2021 11:43 AM ADDENDUM REPORT: 02/21/2021 11:40  HISTORY: Chest pain, nonspecific  EXAM: Cardiac/Coronary CT  TECHNIQUE: The patient was scanned on a [employer].  PROTOCOL: A 120 kV prospective scan was triggered in the descending thoracic aorta at 111 HU's. Axial non-contrast 3 mm slices were carried out through the heart. The data set was analyzed on a dedicated work station and scored using the Agatson method. Gantry rotation speed was 250 msecs and  collimation was 0.6 mm. Heart rate optimized medically, and 0.8 mg of sublingual nitroglycerin was given. The 3D data set was reconstructed in 5% intervals of 35-75% of the R-R cycle. Diastolic phases were analyzed on a dedicated work station using MPR, MIP and VRT modes. The patient received 95mL OMNIPAQUE IOHEXOL 350 MG/ML SOLN of contrast.  FINDINGS: Coronary calcium score: The patient's coronary artery calcium score is 615. She is out of the age range for percentile comparison.  Coronary arteries: Normal coronary origins.  Right dominance.  Right Coronary Artery: Normal caliber vessel, gives rise to PDA. Predominantly noncalcified plaque  with small areas of focal calcification, 1-24% stenosis.  Left Main Coronary Artery: Normal caliber vessel. Calcified plaque at distal LM/bifurcation of LAD and LCx with 1-24% stenosis.  Left Anterior Descending Coronary Artery: Normal caliber vessel. Diffuse mixed calcified and noncalcified plaque in proximal portion, most with 25-49% stenosis but there is a focal area of mixed plaque with 50-69% stenosis. There is a large D1 with scattered plaque and 25-49% stenosis.  Left Circumflex Artery: Normal caliber vessel. Scattered mixed calcified and noncalcified plaque with 25-49% stenosis, small caliber beyond mid portion. Gives rise to 2 OM branches.  Aorta: Normal size, mm at the mid ascending aorta (level of the PA bifurcation) measured double oblique. Aortic calcifications consistent with aortic atherosclerosis. No dissection seen in visualized portions of the aorta.  Aortic Valve: No calcifications. Trileaflet.  Other findings:  Normal pulmonary vein drainage into the left atrium.  Normal left atrial appendage without a thrombus.  Normal size of the pulmonary artery.  Normal appearance of the pericardium.  Significant left ventricular hypertrophy noted.  IMPRESSION: 1. Moderate CAD, CADRADS = 3. CT FFR will be performed and  reported separately.  2. Coronary calcium score of 615. She is out of the age range for percentile comparison  3. Normal coronary origin with right dominance.  4.  Aortic atherosclerosis.  INTERPRETATION:  1. CAD-RADS 0: No evidence of CAD (0%). Consider non-atherosclerotic causes of chest pain.  2. CAD-RADS 1: Minimal non-obstructive CAD (0-24%). Consider non-atherosclerotic causes of chest pain. Consider preventive therapy and risk factor modification.  3. CAD-RADS 2: Mild non-obstructive CAD (25-49%). Consider non-atherosclerotic causes of chest pain. Consider preventive therapy and risk factor modification.  4. CAD-RADS 3: Moderate stenosis (50-69%). Consider symptom-guided anti-ischemic pharmacotherapy as well as risk factor modification per guideline directed care. Additional analysis with CT FFR will be submitted.  5. CAD-RADS 4: Severe stenosis. (70-99% or > 50% left main). Cardiac catheterization or CT FFR is recommended. Consider symptom-guided anti-ischemic pharmacotherapy as well as risk factor modification per guideline directed care. Invasive coronary angiography recommended with revascularization per published guideline statements.  6. CAD-RADS 5: Total coronary occlusion (100%). Consider cardiac catheterization or viability assessment. Consider symptom-guided anti-ischemic pharmacotherapy as well as risk factor modification per guideline directed care.  7. CAD-RADS N: Non-diagnostic study. Obstructive CAD can't be excluded. Alternative evaluation is recommended.   Electronically Signed By: Jodelle Red M.D. On: 02/21/2021 11:40  Narrative EXAM: OVER-READ INTERPRETATION  CT CHEST  The following report is an over-read performed by radiologist Dr. Charlett Nose of [facility] on 02/21/2021. This over-read does not include interpretation of cardiac or coronary anatomy or pathology. The coronary CTA interpretation by  the cardiologist is attached.  COMPARISON:  11/19/2014  FINDINGS: Vascular: Heart is normal size. Aorta normal caliber. Scattered aortic calcifications.  Mediastinum/Nodes: No adenopathy  Lungs/Pleura: No confluent airspace opacities or effusions.  Upper Abdomen: Imaging into the upper abdomen demonstrates no acute findings.  Musculoskeletal: Chest wall soft tissues are unremarkable. No acute bony abnormality.  IMPRESSION: Scattered aortic atherosclerosis.  No acute extra cardiac abnormality.  Electronically Signed: By: Charlett Nose M.D. On: 02/21/2021 10:38           Recent Labs: 12/12/2021: ALT 17 07/22/2022: BUN 27; Creatinine, Ser 1.11; Hemoglobin 13.5; Magnesium 2.2; Platelets 290; Potassium 4.1; Sodium 140; TSH 1.064  Recent Lipid Panel    Component Value Date/Time   CHOL 154 12/12/2021 1029   TRIG 60 12/12/2021 1029   HDL 61 12/12/2021 1029   CHOLHDL  2.5 12/12/2021 1029   LDLCALC 81 12/12/2021 1029     Risk Assessment/Calculations:    CHA2DS2-VASc Score =     This indicates a  % annual risk of stroke. The patient's score is based upon:            Physical Exam:    VS:  There were no vitals taken for this visit.    Wt Readings from Last 3 Encounters:  07/04/22 144 lb (65.3 kg)  05/01/22 156 lb (70.8 kg)  03/29/22 153 lb (69.4 kg)     GEN:  Well nourished, well developed in no acute distress HEENT: Normal NECK: No JVD; No carotid bruits CARDIAC: RRR, no murmurs, rubs, gallops RESPIRATORY:  Clear to auscultation without rales, wheezing or rhonchi  ABDOMEN: Soft, non-tender, non-distended MUSCULOSKELETAL:  No edema; No deformity.  pedal pulses, bilaterally SKIN: Warm and dry NEUROLOGIC:  Alert and oriented x 3 PSYCHIATRIC:  Normal affect   EKG:  EKG is  ordered today.  The ekg ordered today demonstrates       Diagnoses:    No diagnosis found. Assessment and Plan:     New onset atrial fibrillation: CAD without  angina: DOE: Hypertension: Hyperlipidemia LDL goal < 55: LDL 81 on 12/12/21     Disposition:  Medication Adjustments/Labs and Tests Ordered: Current medicines are reviewed at length with the patient today.  Concerns regarding medicines are outlined above.  No orders of the defined types were placed in this encounter.  No orders of the defined types were placed in this encounter.   There are no Patient Instructions on file for this visit.   Signed, Levi Aland, NP  07/29/2022 11:58 AM    [facility] HeartCareThis encounter was created in error - please disregard.

## 2022-07-31 ENCOUNTER — Ambulatory Visit (HOSPITAL_COMMUNITY)
Admission: RE | Admit: 2022-07-31 | Discharge: 2022-07-31 | Disposition: A | Discharge status: Home / Self Care | Payer: Medicare PPO | Source: Ambulatory Visit | Attending: Internal Medicine | Admitting: Internal Medicine

## 2022-07-31 ENCOUNTER — Inpatient Hospital Stay (HOSPITAL_COMMUNITY)
Admission: RE | Admit: 2022-07-31 | Discharge: 2022-07-31 | Disposition: A | Discharge status: Home / Self Care | Payer: Medicare PPO | Source: Ambulatory Visit | Attending: Internal Medicine | Admitting: Internal Medicine

## 2022-07-31 VITALS — BP 156/58 | HR 70 | Ht 67.0 in | Wt 149.6 lb

## 2022-07-31 DIAGNOSIS — I4891 Unspecified atrial fibrillation: Secondary | ICD-10-CM | POA: Diagnosis not present

## 2022-07-31 DIAGNOSIS — I480 Paroxysmal atrial fibrillation: Secondary | ICD-10-CM

## 2022-07-31 DIAGNOSIS — I10 Essential (primary) hypertension: Secondary | ICD-10-CM | POA: Diagnosis not present

## 2022-07-31 DIAGNOSIS — Z7901 Long term (current) use of anticoagulants: Secondary | ICD-10-CM | POA: Diagnosis not present

## 2022-07-31 DIAGNOSIS — D6869 Other thrombophilia: Secondary | ICD-10-CM | POA: Diagnosis not present

## 2022-07-31 DIAGNOSIS — E785 Hyperlipidemia, unspecified: Secondary | ICD-10-CM | POA: Diagnosis not present

## 2022-07-31 MED ORDER — APIXABAN 5 MG PO TABS: 5.0000 mg | ORAL_TABLET | Freq: Two times a day (BID) | ORAL | 3 refills | Status: DC

## 2022-07-31 NOTE — Progress Notes (Signed)
Primary Care Physician: Genelle Gather, DO Primary Cardiologist: Dr. Katrinka Blazing Primary Electrophysiologist: None Referring Physician: [facility]    Anne [name] is a 89 y.o. female with a history of HTN, HLD, GERD, anxiety, nonobstructive CAD, asthma, and atrial fibrillation who presents for consultation in the [facility]. The patient was initially diagnosed with atrial fibrillation on 07/22/22 after presenting to [facility] ED with symptoms of shortness of breath and cough. She was in Afib without RVR and discharged on Eliquis 2.5 mg BID. Patient is on Eliquis 2.5 mg BID for a CHADS2VASC score of 4.  On evaluation today, she is currently in SR. She was sick and on prednisone which led to ED evaluation when she was found to be Afib that was rate controlled. She converted to SR spontaneously but does not know when this happened. She appears to not have any cardiac awareness of her Afib. She maybe feels chest pain at time but this is noted historically in review of records as well.   She is compliant with anticoagulation and has not missed any doses. She has no bleeding concerns.  Today, she denies symptoms of palpitations, shortness of breath, orthopnea, PND, lower extremity edema, dizziness, presyncope, syncope, snoring, daytime somnolence, bleeding, or neurologic sequela. The patient is tolerating medications without difficulties and is otherwise without complaint today.   Atrial Fibrillation Risk Factors:  she does not have symptoms or diagnosis of sleep apnea. she does not have a history of rheumatic fever. she does not have a history of alcohol use. The patient does not have a history of early familial atrial fibrillation or other arrhythmias.  she has a BMI of Body mass index is 23.43 kg/m.Marland Kitchen Filed Weights   07/31/22 1113  Weight: 67.9 kg    Family History  Problem Relation Age of Onset   Cancer Mother    Heart disease Father    Breast  cancer Cousin    Heart attack Neg Hx      Atrial Fibrillation Management history:  Previous antiarrhythmic drugs: None Previous cardioversions: None Previous ablations: None Anticoagulation history: Eliquis 2.5 mg BID   Past Medical History:  Diagnosis Date   Allergic rhinitis    Arthritis    Asthma    Atypical chest pain    Collapse of right lung    Dyslipidemia    Gastroesophageal reflux disease    Glaucoma    Hematuria    Hyperlipidemia    Lung nodules    PVC's (premature ventricular contractions) 02/21/2021   Sinus bradycardia 02/21/2021   Past Surgical History:  Procedure Laterality Date   ANAL FISSURE REPAIR     BREAST BIOPSY Right    CATARACT EXTRACTION Bilateral    MASS EXCISION  04/11/2012   Procedure: EXCISION MASS;  Surgeon: Nicki Reaper, MD;  Location: [facility];  Service: Orthopedics;  Laterality: Right;  Excision of mass right upper arm   MEDIASTINOSCOPY  4/11   NASAL SINUS SURGERY     TOTAL ABDOMINAL HYSTERECTOMY W/ BILATERAL SALPINGOOPHORECTOMY      Current Outpatient Medications  Medication Sig Dispense Refill   albuterol (PROAIR HFA) 108 (90 Base) MCG/ACT inhaler INHALE 1-2 PUFFS INTO THE LUNGS EVERY 6 (SIX) HOURS AS NEEDED FOR WHEEZING OR SHORTNESS OF BREATH. 18 g 3   amLODipine (NORVASC) 5 MG tablet Take 1 tablet by mouth daily.     apixaban (ELIQUIS) 5 MG TABS tablet Take 1 tablet (5 mg total) by mouth  2 (two) times daily. 60 tablet 3   ASPIRIN LOW DOSE 81 MG tablet TAKE 1 TABLET (81 MG TOTAL) BY MOUTH DAILY. SWALLOW WHOLE. 90 tablet 3   BREO ELLIPTA 100-25 MCG/ACT AEPB TAKE 1 PUFF BY MOUTH EVERY DAY 60 each 11   Calcium Carb-Cholecalciferol 600-200 MG-UNIT TABS Take 1 tablet by mouth daily.     cetirizine (ZYRTEC) 10 MG tablet Take 10 mg by mouth every morning.     dorzolamide-timolol (COSOPT) 2-0.5 % ophthalmic solution Place 1 drop into the right eye 2 (two) times daily.     ferrous sulfate 325 (65 FE) MG tablet Take 325 mg by  mouth 3 (three) times a week. Take on MWF  Take once a week reported on: 01/01/2022     fluticasone (FLONASE) 50 MCG/ACT nasal spray Place 1 spray into both nostrils 2 (two) times daily as needed for allergies.      gabapentin (NEURONTIN) 300 MG capsule Take 1 capsule (300 mg total) by mouth at bedtime. 90 capsule 3   latanoprost (XALATAN) 0.005 % ophthalmic solution Place 1 drop into both eyes at bedtime.     meloxicam (MOBIC) 7.5 MG tablet Take 7.5 mg by mouth as needed.     metoprolol succinate (TOPROL-XL) 25 MG 24 hr tablet TAKE 1 TABLET (25 MG TOTAL) BY MOUTH DAILY. 90 tablet 3   Multiple Vitamin (MULTIVITAMIN) tablet Take 1 tablet by mouth every morning.      nitroGLYCERIN (NITROSTAT) 0.4 MG SL tablet Place 1 tablet (0.4 mg total) under the tongue every 5 (five) minutes as needed for chest pain (Up to 3 doses in 15 minutes.). 25 tablet 3   rosuvastatin (CRESTOR) 20 MG tablet TAKE 1 TABLET BY MOUTH EVERY DAY 90 tablet 2   No current facility-administered medications for this encounter.   Facility-Administered Medications Ordered in Other Encounters  Medication Dose Route Frequency Provider Last Rate Last Admin   regadenoson (LEXISCAN) injection SOLN 0.4 mg  0.4 mg Intravenous Once Quintella Reichert, MD       regadenoson (LEXISCAN) injection SOLN 0.4 mg  0.4 mg Intravenous Once Little Ishikawa, MD       technetium tetrofosmin (TC-MYOVIEW) injection 32.3 millicurie  32.3 millicurie Intravenous Once PRN Little Ishikawa, MD        Allergies  Allergen Reactions   Other Shortness Of Breath    Pt states she has asthma and seafood causes breathing problems.    Shellfish Allergy Shortness Of Breath    Pt states she has asthma and seafood causes breathing problems.   Iodine Other (See Comments)    REACTION: " UNSURE TOLD TO LIST AS ALLERGEN BY MD" REACTION: " UNSURE TOLD TO LIST AS ALLERGEN BY MD"   Latex Itching and Rash   Methocarbamol Other (See Comments)   Pravastatin  Other (See Comments)   Adhesive [Tape] Rash    Social History   Socioeconomic History   Marital status: Widowed    Spouse name: Not on file   Number of children: Not on file   Years of education: Not on file   Highest education level: Not on file  Occupational History   Occupation: retired  Tobacco Use   Smoking status: Former    Years: 4    Types: Cigarettes    Start date: 1953    Quit date: 1960    Years since quitting: 64.3   Smokeless tobacco: Never   Tobacco comments:    social smoker  Advertising account planner  Vaping Use: Never used  Substance and Sexual Activity   Alcohol use: No    Alcohol/week: 0.0 standard drinks of alcohol   Drug use: No   Sexual activity: Never    Birth control/protection: Surgical  Other Topics Concern   Not on file  Social History Narrative   Retired Public relations account executive.  Lives alone, husband passed away in 2016/08/25.  No children.   Right handed   One level home   Social Determinants of Health   Financial Resource Strain: Not on file  Food Insecurity: Not on file  Transportation Needs: Not on file  Physical Activity: Not on file  Stress: Not on file  Social Connections: Not on file  Intimate Partner Violence: Not on file     ROS- All systems are reviewed and negative except as per the HPI above.  Physical Exam: Vitals:   07/31/22 1113  BP: (!) 156/58  Pulse: 70  Weight: 67.9 kg  Height: 5\' 7"  (1.702 m)    GEN- The patient is a well appearing female, alert and oriented x 3 today.   Head- normocephalic, atraumatic Eyes-  Sclera clear, conjunctiva pink Ears- hearing intact Oropharynx- clear Neck- supple  Lungs- Clear to ausculation bilaterally, normal work of breathing Heart- Regular rate and rhythm, no murmurs, rubs or gallops  GI- soft, NT, ND, + BS Extremities- no clubbing, cyanosis, or edema MS- no significant deformity or atrophy Skin- no rash or lesion Psych- euthymic mood, full affect Neuro- strength and sensation are  intact  Wt Readings from Last 3 Encounters:  07/31/22 67.9 kg  07/04/22 65.3 kg  05/01/22 70.8 kg    EKG today demonstrates  Vent. rate 70 BPM PR interval 200 ms QRS duration 68 ms QT/QTcB 350/378 ms P-R-T axes 74 65 72 Sinus rhythm with Premature supraventricular complexes Otherwise normal ECG When compared with ECG of 22-Jul-2022 17:20, PREVIOUS ECG IS PRESENT  Echo 03/21/22 demonstrated: 1. Left ventricular ejection fraction, by estimation, is 60 to 65%. The  left ventricle has normal function. The left ventricle has no regional  wall motion abnormalities. Left ventricular diastolic parameters were  normal. The average left ventricular  global longitudinal strain is -19.4 %. The global longitudinal strain is  normal.   2. Right ventricular systolic function is normal. The right ventricular  size is normal. There is mildly elevated pulmonary artery systolic  pressure. The estimated right ventricular systolic pressure is 37.6 mmHg.   3. The mitral valve is normal in structure. Mild mitral valve  regurgitation. No evidence of mitral stenosis.   4. Tricuspid valve regurgitation is moderate.   5. The aortic valve is tricuspid. There is mild calcification of the  aortic valve. There is mild thickening of the aortic valve. Aortic valve  regurgitation is not visualized. Aortic valve sclerosis is present, with  no evidence of aortic valve stenosis.   6. The inferior vena cava is normal in size with greater than 50%  respiratory variability, suggesting right atrial pressure of 3 mmHg.  Epic records are reviewed at length today.  CHA2DS2-VASc Score = 4  The patient's score is based upon: CHF History: 0 HTN History: 1 Diabetes History: 0 Stroke History: 0 Vascular Disease History: 0 Age Score: 2 Gender Score: 1       ASSESSMENT AND PLAN: Paroxysmal Atrial Fibrillation (ICD10:  I48.0) The patient's CHA2DS2-VASc score is 4, indicating a 4.8% annual risk of stroke.     Education provided about Afib with visual diagram. Discussion about medication treatments  provided. After discussion, we will proceed with conservative observation at this time.  She is in SR today. She does not know when she converted to SR. She is not sure if she feels when she has been in Afib. Due to uncertainty of Afib burden, will place cardiac monitor for 2 weeks and see her back in 1 month to discuss results. We could consider Tikosyn or amiodarone as AAD options, caution given asthma history, if medication treatment is desired.   2. Secondary Hypercoagulable State (ICD10:  D68.69) The patient is at significant risk for stroke/thromboembolism based upon her CHA2DS2-VASc Score of 4.  Continue Apixaban (Eliquis).   No missed doses.  Discontinue ASA.  Patient is 89 years of age, weighs > 60 kg, and most recent creatinine 07/22/22 is 1.11.   I will transition her to Eliquis 5 mg BID for anticoagulation.  3. HTN Elevated today, advised to trend at home.   Follow up in 1 month Afib clinic to discuss monitor results.   Lake Bells, PA-C Afib Clinic [facility] [address] [place], [place] [address] [phone] 07/31/2022 12:15 PM

## 2022-07-31 NOTE — Patient Instructions (Signed)
Increase Eliquis to 5mg twice a day 

## 2022-08-01 ENCOUNTER — Ambulatory Visit: Payer: Medicare PPO | Admitting: Student

## 2022-08-02 ENCOUNTER — Ambulatory Visit: Payer: Medicare PPO | Admitting: Nurse Practitioner

## 2022-08-02 ENCOUNTER — Telehealth: Payer: Self-pay | Admitting: Internal Medicine

## 2022-08-02 NOTE — Telephone Encounter (Signed)
[facility] [address]  She would like to try Trelegy again. She has used Engineer, materials for years but feels Trelegy works better.

## 2022-08-03 NOTE — Telephone Encounter (Signed)
Patient would like to go back on  trelegy- has used in the past and worked better for her. Dr. Marchelle Gearing please advise if this okay

## 2022-08-03 NOTE — Telephone Encounter (Signed)
PT calling again yo see how her RX is going. Pls call. TY.

## 2022-08-03 NOTE — Telephone Encounter (Signed)
(  Addendum) PT upset no CB. When I explained we art short staffed and sick PT take priority she said "And asthma is not being sick?" I explained we handel calls in order of urgency and reminded her that she simply wants to switch medications so she already has meds in place unlike sick PT's. She still felt it was inexcusable and I assured here we were working as fast as we can to get an answer for her on switching her inhaler. She mumbled that she would have to "find another place."

## 2022-08-06 MED ORDER — TRELEGY ELLIPTA 100-62.5-25 MCG/ACT IN AEPB: 1.0000 | INHALATION_SPRAY | Freq: Every day | RESPIRATORY_TRACT | 6 refills | Status: DC

## 2022-08-06 NOTE — Telephone Encounter (Signed)
Trelegy - ok

## 2022-08-06 NOTE — Telephone Encounter (Signed)
Spoke to pt and informed her I will [facility]. Pt verbalized understanding nothing further needed.

## 2022-08-09 ENCOUNTER — Ambulatory Visit: Payer: Medicare PPO | Admitting: Nurse Practitioner

## 2022-08-18 DIAGNOSIS — I480 Paroxysmal atrial fibrillation: Secondary | ICD-10-CM | POA: Diagnosis not present

## 2022-08-30 ENCOUNTER — Ambulatory Visit (HOSPITAL_COMMUNITY)
Admission: RE | Admit: 2022-08-30 | Discharge: 2022-08-30 | Disposition: A | Discharge status: Home / Self Care | Payer: Medicare PPO | Source: Ambulatory Visit | Attending: Internal Medicine | Admitting: Internal Medicine

## 2022-08-30 VITALS — BP 116/50 | HR 60 | Ht 67.0 in | Wt 149.0 lb

## 2022-08-30 DIAGNOSIS — I10 Essential (primary) hypertension: Secondary | ICD-10-CM | POA: Diagnosis not present

## 2022-08-30 DIAGNOSIS — K219 Gastro-esophageal reflux disease without esophagitis: Secondary | ICD-10-CM | POA: Insufficient documentation

## 2022-08-30 DIAGNOSIS — Z7901 Long term (current) use of anticoagulants: Secondary | ICD-10-CM | POA: Diagnosis not present

## 2022-08-30 DIAGNOSIS — D6869 Other thrombophilia: Secondary | ICD-10-CM | POA: Diagnosis not present

## 2022-08-30 DIAGNOSIS — I480 Paroxysmal atrial fibrillation: Secondary | ICD-10-CM | POA: Insufficient documentation

## 2022-08-30 LAB — CBC
HCT: 37.7 % (ref 36.0–46.0)
Hemoglobin: 12.1 g/dL (ref 12.0–15.0)
MCH: 29.9 pg (ref 26.0–34.0)
MCHC: 32.1 g/dL (ref 30.0–36.0)
MCV: 93.1 fL (ref 80.0–100.0)
Platelets: 260 10*3/uL (ref 150–400)
RBC: 4.05 MIL/uL (ref 3.87–5.11)
RDW: 14.3 % (ref 11.5–15.5)
WBC: 6.1 10*3/uL (ref 4.0–10.5)
nRBC: 0 % (ref 0.0–0.2)

## 2022-08-30 NOTE — Patient Instructions (Signed)
Kardia mobile 

## 2022-08-30 NOTE — Progress Notes (Signed)
Primary Care Physician: Genelle Gather, DO Primary Cardiologist: Dr. Katrinka Blazing Primary Electrophysiologist: None Referring Physician: [facility]    Anne [name] is a 89 y.o. female with a history of HTN, HLD, GERD, anxiety, nonobstructive CAD, asthma, and atrial fibrillation who presents for consultation in the [facility]. The patient was initially diagnosed with atrial fibrillation on 07/22/22 after presenting to [facility] ED with symptoms of shortness of breath and cough. She was in Afib without RVR and discharged on Eliquis 2.5 mg BID. Patient is on Eliquis 2.5 mg BID for a CHADS2VASC score of 4.  On evaluation 07/31/22, she is currently in SR. She was sick and on prednisone which led to ED evaluation when she was found to be Afib that was rate controlled. She converted to SR spontaneously but does not know when this happened. She appears to not have any cardiac awareness of her Afib. She maybe feels chest pain at time but this is noted historically in review of records as well.   She is compliant with anticoagulation and has not missed any doses. She has no bleeding concerns.  On follow up 08/30/22, she is currently in NSR. Cardiac monitor worn for 2 weeks did not show any Afib burden. Since last office visit, she has felt well overall. She has been compliant with Toprol 25 mg daily and Eliquis 5 mg BID. No bleeding concerns.  Today, she denies symptoms of palpitations, shortness of breath, orthopnea, PND, lower extremity edema, dizziness, presyncope, syncope, snoring, daytime somnolence, bleeding, or neurologic sequela. The patient is tolerating medications without difficulties and is otherwise without complaint today.   Atrial Fibrillation Risk Factors:  she does not have symptoms or diagnosis of sleep apnea. she does not have a history of rheumatic fever. she does not have a history of alcohol use. The patient does not have a history of early  familial atrial fibrillation or other arrhythmias.  she has a BMI of Body mass index is 23.34 kg/m.Marland Kitchen Filed Weights   08/30/22 1108  Weight: 67.6 kg     Family History  Problem Relation Age of Onset   Cancer Mother    Heart disease Father    Breast cancer Cousin    Heart attack Neg Hx    Atrial Fibrillation Management history:  Previous antiarrhythmic drugs: None Previous cardioversions: None Previous ablations: None Anticoagulation history: Eliquis 5 mg BID   Past Medical History:  Diagnosis Date   Allergic rhinitis    Arthritis    Asthma    Atypical chest pain    Collapse of right lung    Dyslipidemia    Gastroesophageal reflux disease    Glaucoma    Hematuria    Hyperlipidemia    Lung nodules    PVC's (premature ventricular contractions) 02/21/2021   Sinus bradycardia 02/21/2021   Past Surgical History:  Procedure Laterality Date   ANAL FISSURE REPAIR     BREAST BIOPSY Right    CATARACT EXTRACTION Bilateral    MASS EXCISION  04/11/2012   Procedure: EXCISION MASS;  Surgeon: Nicki Reaper, MD;  [facility];  Service: Orthopedics;  Laterality: Right;  Excision of mass right upper arm   MEDIASTINOSCOPY  4/11   NASAL SINUS SURGERY     TOTAL ABDOMINAL HYSTERECTOMY W/ BILATERAL SALPINGOOPHORECTOMY      Current Outpatient Medications  Medication Sig Dispense Refill   albuterol (PROAIR HFA) 108 (90 Base) MCG/ACT inhaler INHALE 1-2 PUFFS INTO THE  LUNGS EVERY 6 (SIX) HOURS AS NEEDED FOR WHEEZING OR SHORTNESS OF BREATH. 18 g 3   amLODipine (NORVASC) 5 MG tablet Take 1 tablet by mouth daily.     apixaban (ELIQUIS) 5 MG TABS tablet Take 1 tablet (5 mg total) by mouth 2 (two) times daily. 60 tablet 3   Calcium Carb-Cholecalciferol 600-200 MG-UNIT TABS Take 1 tablet by mouth daily.     cetirizine (ZYRTEC) 10 MG tablet Take 10 mg by mouth every morning.     dorzolamide-timolol (COSOPT) 2-0.5 % ophthalmic solution Place 1 drop into the right eye 2  (two) times daily.     ferrous sulfate 325 (65 FE) MG tablet Take 325 mg by mouth 3 (three) times a week. Take on MWF  Take once a week reported on: 01/01/2022     fluticasone (FLONASE) 50 MCG/ACT nasal spray Place 1 spray into both nostrils 2 (two) times daily as needed for allergies.      Fluticasone-Umeclidin-Vilant (TRELEGY ELLIPTA) 100-62.5-25 MCG/ACT AEPB Inhale 1 puff into the lungs daily. 60 each 6   gabapentin (NEURONTIN) 300 MG capsule Take 1 capsule (300 mg total) by mouth at bedtime. 90 capsule 3   latanoprost (XALATAN) 0.005 % ophthalmic solution Place 1 drop into both eyes at bedtime.     metoprolol succinate (TOPROL-XL) 25 MG 24 hr tablet TAKE 1 TABLET (25 MG TOTAL) BY MOUTH DAILY. 90 tablet 3   Multiple Vitamin (MULTIVITAMIN) tablet Take 1 tablet by mouth every morning.      nitroGLYCERIN (NITROSTAT) 0.4 MG SL tablet Place 1 tablet (0.4 mg total) under the tongue every 5 (five) minutes as needed for chest pain (Up to 3 doses in 15 minutes.). 25 tablet 3   rosuvastatin (CRESTOR) 20 MG tablet TAKE 1 TABLET BY MOUTH EVERY DAY 90 tablet 2   No current facility-administered medications for this encounter.   Facility-Administered Medications Ordered in Other Encounters  Medication Dose Route Frequency Provider Last Rate Last Admin   regadenoson (LEXISCAN) injection SOLN 0.4 mg  0.4 mg Intravenous Once Quintella Reichert, MD       regadenoson (LEXISCAN) injection SOLN 0.4 mg  0.4 mg Intravenous Once Little Ishikawa, MD       technetium tetrofosmin (TC-MYOVIEW) injection 32.3 millicurie  32.3 millicurie Intravenous Once PRN Little Ishikawa, MD        Allergies  Allergen Reactions   Other Shortness Of Breath    Pt states she has asthma and seafood causes breathing problems.    Shellfish Allergy Shortness Of Breath    Pt states she has asthma and seafood causes breathing problems.   Iodine Other (See Comments)    REACTION: " UNSURE TOLD TO LIST AS ALLERGEN BY  MD" REACTION: " UNSURE TOLD TO LIST AS ALLERGEN BY MD"   Latex Itching and Rash   Methocarbamol Other (See Comments)   Pravastatin Other (See Comments)   Adhesive [Tape] Rash    Social History   Socioeconomic History   Marital status: Widowed    Spouse name: Not on file   Number of children: Not on file   Years of education: Not on file   Highest education level: Not on file  Occupational History   Occupation: retired  Tobacco Use   Smoking status: Former    Years: 4    Types: Cigarettes    Start date: 1953    Quit date: 1960    Years since quitting: 64.4   Smokeless tobacco: Never   Tobacco  comments:    social smoker  Vaping Use   Vaping Use: Never used  Substance and Sexual Activity   Alcohol use: No    Alcohol/week: 0.0 standard drinks of alcohol   Drug use: No   Sexual activity: Never    Birth control/protection: Surgical  Other Topics Concern   Not on file  Social History Narrative   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  No children.   Right handed   One level home   Social Determinants of Health   Financial Resource Strain: Not on file  Food Insecurity: Not on file  Transportation Needs: Not on file  Physical Activity: Not on file  Stress: Not on file  Social Connections: Not on file  Intimate Partner Violence: Not on file     ROS- All systems are reviewed and negative except as per the HPI above.  Physical Exam: Vitals:   08/30/22 1108  BP: (!) 116/50  Pulse: 60  Weight: 67.6 kg  Height: 5\' 7"  (1.702 m)    GEN- The patient is well appearing, alert and oriented x 3 today.   Head- normocephalic, atraumatic Eyes-  Sclera clear, conjunctiva pink Ears- hearing intact Lungs- Clear to ausculation bilaterally, normal work of breathing Heart- Regular rate and rhythm, no murmurs, rubs or gallops, PMI not laterally displaced Extremities- no clubbing, cyanosis, or edema MS- no significant deformity or atrophy Skin-  no rash or lesion Psych- euthymic mood, full affect Neuro- strength and sensation are intact   Wt Readings from Last 3 Encounters:  08/30/22 67.6 kg  07/31/22 67.9 kg  07/04/22 65.3 kg    EKG today demonstrates  Vent. rate 60 BPM PR interval 204 ms QRS duration 74 ms QT/QTcB 364/364 ms P-R-T axes 74 59 72 Sinus rhythm with Premature supraventricular complexes Septal infarct , age undetermined Abnormal ECG When compared with ECG of 31-Jul-2022 11:31, PREVIOUS ECG IS PRESENT  Echo 03/21/22 demonstrated: 1. Left ventricular ejection fraction, by estimation, is 60 to 65%. The  left ventricle has normal function. The left ventricle has no regional  wall motion abnormalities. Left ventricular diastolic parameters were  normal. The average left ventricular  global longitudinal strain is -19.4 %. The global longitudinal strain is  normal.   2. Right ventricular systolic function is normal. The right ventricular  size is normal. There is mildly elevated pulmonary artery systolic  pressure. The estimated right ventricular systolic pressure is 37.6 mmHg.   3. The mitral valve is normal in structure. Mild mitral valve  regurgitation. No evidence of mitral stenosis.   4. Tricuspid valve regurgitation is moderate.   5. The aortic valve is tricuspid. There is mild calcification of the  aortic valve. There is mild thickening of the aortic valve. Aortic valve  regurgitation is not visualized. Aortic valve sclerosis is present, with  no evidence of aortic valve stenosis.   6. The inferior vena cava is normal in size with greater than 50%  respiratory variability, suggesting right atrial pressure of 3 mmHg.  Epic records are reviewed at length today.  Cardiac monitor 4/30-5/14/24: Patch Wear Time:  13 days and 23 hours    Predominant rhythm was sinus rhythm 13.1% supraventricular ectopy Less than 1% ventricular ectopy No atrial fibrillation noted Patient triggered episodes associated  with sinus rhythm with PACs  CHA2DS2-VASc Score = 4  The patient's score is based upon: CHF History: 0 HTN History: 1 Diabetes History: 0 Stroke History: 0 Vascular Disease History: 0  Age Score: 2 Gender Score: 1     ASSESSMENT AND PLAN: Paroxysmal Atrial Fibrillation (ICD10:  I48.0) The patient's CHA2DS2-VASc score is 4, indicating a 4.8% annual risk of stroke.    Education provided about Afib with visual diagram. Discussion about medication treatments provided. After discussion, we will proceed with conservative observation at this time.  She is in SR today. Recommended Kardiamobile device (she will ask niece for help setting up on her phone) for patient to monitor rhythm at home. I suspect she may not have cardiac awareness; rate controlled Afib when she was in ED. We could consider Tikosyn or amiodarone as AAD options, caution given asthma history, if medication treatment is indicated in the future. If no cardiac awareness, can also consider rate control.   Continue Toprol 25 mg daily.   2. Secondary Hypercoagulable State (ICD10:  D68.69) The patient is at significant risk for stroke/thromboembolism based upon her CHA2DS2-VASc Score of 4.  Continue Apixaban (Eliquis).   No missed doses. Continue Eliquis 5 mg BID CBC drawn today.  3. HTN Stable today, no changes.   Follow up September Afib clinic.   Lake Bells, PA-C Afib Clinic [facility] [address] [place], [place] [address] [phone] 08/30/2022 12:10 PM

## 2022-09-04 ENCOUNTER — Encounter: Payer: Self-pay | Admitting: Cardiology

## 2022-09-04 ENCOUNTER — Ambulatory Visit: Payer: Medicare PPO | Attending: Cardiology | Admitting: Cardiology

## 2022-09-04 VITALS — BP 124/54 | HR 71 | Ht 67.0 in | Wt 150.0 lb

## 2022-09-04 DIAGNOSIS — D6869 Other thrombophilia: Secondary | ICD-10-CM

## 2022-09-04 DIAGNOSIS — I480 Paroxysmal atrial fibrillation: Secondary | ICD-10-CM

## 2022-09-04 DIAGNOSIS — I10 Essential (primary) hypertension: Secondary | ICD-10-CM

## 2022-09-04 DIAGNOSIS — E785 Hyperlipidemia, unspecified: Secondary | ICD-10-CM | POA: Diagnosis not present

## 2022-09-04 NOTE — Patient Instructions (Signed)
Medication Instructions:  The current medical regimen is effective;  continue present plan and medications.  *If you need a refill on your cardiac medications before your next appointment, please call your pharmacy*   Follow-Up: At [facility], you and your health needs are our priority.  As part of our continuing mission to provide you with exceptional heart care, we have created designated Provider Care Teams.  These Care Teams include your primary Cardiologist (physician) and Advanced Practice Providers (APPs -  Physician Assistants and Nurse Practitioners) who all work together to provide you with the care you need, when you need it.  We recommend signing up for the patient portal called "MyChart".  Sign up information is provided on this After Visit Summary.  MyChart is used to connect with patients for Virtual Visits (Telemedicine).  Patients are able to view lab/test results, encounter notes, upcoming appointments, etc.  Non-urgent messages can be sent to your provider as well.   To learn more about what you can do with MyChart, go to [email or web address].    Your next appointment:   1 year(s)  Provider:   Dr Mark Skains      

## 2022-09-04 NOTE — Progress Notes (Signed)
solution Place 1 drop into both eyes at bedtime.   metoprolol succinate (TOPROL-XL) 25 MG 24 hr tablet TAKE 1 TABLET (25 MG TOTAL) BY MOUTH DAILY.   Multiple Vitamin (MULTIVITAMIN) tablet Take 1 tablet by mouth every morning.    nitroGLYCERIN (NITROSTAT) 0.4 MG SL tablet Place 1 tablet (0.4 mg total) under the tongue every 5 (five) minutes as needed for chest pain (Up to 3 doses in 15 minutes.).   rosuvastatin (CRESTOR) 20 MG tablet TAKE 1 TABLET BY MOUTH EVERY DAY     Allergies:   Other, Shellfish allergy, Iodine, Latex, Methocarbamol, Pravastatin, and Adhesive [tape]   Social History   Socioeconomic History   Marital status: Widowed    Spouse name: Not on  file   Number of children: Not on file   Years of education: Not on file   Highest education level: Not on file  Occupational History   Occupation: retired  Tobacco Use   Smoking status: Former    Years: 4    Types: Cigarettes    Start date: 09-28-1951    Quit date: 1960    Years since quitting: 64.4   Smokeless tobacco: Never   Tobacco comments:    social smoker  Building [name] engineer Use: Never used  Substance and Sexual Activity   Alcohol use: No    Alcohol/week: 0.0 standard drinks of alcohol   Drug use: No   Sexual activity: Never    Birth control/protection: Surgical  Other Topics Concern   Not on file  Social History Narrative   Retired Public relations account executive.  Lives alone, husband passed away in 09/27/2016.  No children.   Right handed   One level home   Social Determinants of Health   Financial Resource Strain: Not on file  Food Insecurity: Not on file  Transportation Needs: Not on file  Physical Activity: Not on file  Stress: Not on file  Social Connections: Not on file     Family History: The patient's family history includes Breast cancer in her cousin; Cancer in her mother; Heart disease in her father. There is no history of Heart attack.  ROS:   Please see the history of present illness.     All other systems reviewed and are negative.  EKGs/Labs/Other Studies Reviewed:    The following studies were reviewed today: Cardiac Studies & Procedures     STRESS TESTS  MYOCARDIAL PERFUSION IMAGING 06/09/2021  Narrative   The study is normal. The study is low risk.   No ST deviation was noted.   LV perfusion is normal. There is no evidence of ischemia. There is no evidence of infarction.   Left ventricular function is normal. Nuclear stress EF: 74 %. The left ventricular ejection fraction is hyperdynamic (>65%). End diastolic cavity size is normal. End systolic cavity size is normal.   Prior study available for comparison from 11/26/2014.  Fixed perfusion defect in  apical inferior wall and apex with normal wall motion consistent with artifact Low risk study   ECHOCARDIOGRAM  ECHOCARDIOGRAM COMPLETE 03/21/2022  Narrative ECHOCARDIOGRAM REPORT    Patient Name:   Anne [name] Date of Exam: 03/21/2022 Medical Rec #:  [id number]           Height:       67.0 in Accession #:    [id number]          Weight:       154.4 lb Date of Birth:  Jul 25, 1930  Cardiology Office Note:    Date:  09/04/2022   ID:  Anne [name], DOB 08-10-1930, MRN [id number]  PCP:  Genelle Gather, DO   [facility] Providers Cardiologist:  Donato Schultz, MD     Referring MD: Thana Ates, MD    History of Present Illness:    Anne [name] is a 89 y.o. female former patient of Dr. Verdis Prime here with nonobstructive coronary artery disease here for follow-up atrial fibrillation.  Has been seen in the A-fib clinic.  Currently in sinus rhythm.  Doing well.  On anticoagulation.  Medications reviewed.  Diagnosed with atrial fibrillation on 07/22/22 after presenting to [facility] ED with symptoms of shortness of breath and cough. She was in Afib without RVR and discharged on Eliquis 2.5 mg BID. Patient is on Eliquis 2.5 mg BID for a CHADS2VASC score of 4.   On evaluation 07/31/22, she is currently in SR. She was sick and on prednisone which led to ED evaluation when she was found to be Afib that was rate controlled. She converted to SR spontaneously but does not know when this happened. She appears to not have any cardiac awareness of her Afib.  Had a cold for so long, has to clear her throat and drainage.   Multiple records reviewed lab work reviewed studies reviewed  Past Medical History:  Diagnosis Date   Allergic rhinitis    Arthritis    Asthma    Atypical chest pain    Collapse of right lung    Dyslipidemia    Gastroesophageal reflux disease    Glaucoma    Hematuria    Hyperlipidemia    Lung nodules    PVC's (premature ventricular contractions) 02/21/2021   Sinus bradycardia 02/21/2021    Past Surgical History:  Procedure Laterality Date   ANAL FISSURE REPAIR     BREAST BIOPSY Right    CATARACT EXTRACTION Bilateral    MASS EXCISION  04/11/2012   Procedure: EXCISION MASS;  Surgeon: Nicki Reaper, MD;  Location: [facility];  Service: Orthopedics;  Laterality: Right;  Excision of mass right upper arm    MEDIASTINOSCOPY  4/11   NASAL SINUS SURGERY     TOTAL ABDOMINAL HYSTERECTOMY W/ BILATERAL SALPINGOOPHORECTOMY      Current Medications: Current Meds  Medication Sig   albuterol (PROAIR HFA) 108 (90 Base) MCG/ACT inhaler INHALE 1-2 PUFFS INTO THE LUNGS EVERY 6 (SIX) HOURS AS NEEDED FOR WHEEZING OR SHORTNESS OF BREATH.   amLODipine (NORVASC) 5 MG tablet Take 1 tablet by mouth daily.   apixaban (ELIQUIS) 5 MG TABS tablet Take 1 tablet (5 mg total) by mouth 2 (two) times daily.   Calcium Carb-Cholecalciferol 600-200 MG-UNIT TABS Take 1 tablet by mouth daily.   cetirizine (ZYRTEC) 10 MG tablet Take 10 mg by mouth every morning.   dorzolamide-timolol (COSOPT) 2-0.5 % ophthalmic solution Place 1 drop into the right eye 2 (two) times daily.   ferrous sulfate 325 (65 FE) MG tablet Take 325 mg by mouth 3 (three) times a week. Take on MWF  Take once a week reported on: 01/01/2022   fluticasone (FLONASE) 50 MCG/ACT nasal spray Place 1 spray into both nostrils 2 (two) times daily as needed for allergies.    Fluticasone-Umeclidin-Vilant (TRELEGY ELLIPTA) 100-62.5-25 MCG/ACT AEPB Inhale 1 puff into the lungs daily.   gabapentin (NEURONTIN) 300 MG capsule Take 1 capsule (300 mg total) by mouth at bedtime.   latanoprost (XALATAN) 0.005 % ophthalmic  solution Place 1 drop into both eyes at bedtime.   metoprolol succinate (TOPROL-XL) 25 MG 24 hr tablet TAKE 1 TABLET (25 MG TOTAL) BY MOUTH DAILY.   Multiple Vitamin (MULTIVITAMIN) tablet Take 1 tablet by mouth every morning.    nitroGLYCERIN (NITROSTAT) 0.4 MG SL tablet Place 1 tablet (0.4 mg total) under the tongue every 5 (five) minutes as needed for chest pain (Up to 3 doses in 15 minutes.).   rosuvastatin (CRESTOR) 20 MG tablet TAKE 1 TABLET BY MOUTH EVERY DAY     Allergies:   Other, Shellfish allergy, Iodine, Latex, Methocarbamol, Pravastatin, and Adhesive [tape]   Social History   Socioeconomic History   Marital status: Widowed    Spouse name: Not on  file   Number of children: Not on file   Years of education: Not on file   Highest education level: Not on file  Occupational History   Occupation: retired  Tobacco Use   Smoking status: Former    Years: 4    Types: Cigarettes    Start date: 09-28-1951    Quit date: 1960    Years since quitting: 64.4   Smokeless tobacco: Never   Tobacco comments:    social smoker  Building [name] engineer Use: Never used  Substance and Sexual Activity   Alcohol use: No    Alcohol/week: 0.0 standard drinks of alcohol   Drug use: No   Sexual activity: Never    Birth control/protection: Surgical  Other Topics Concern   Not on file  Social History Narrative   Retired Public relations account executive.  Lives alone, husband passed away in 09/27/2016.  No children.   Right handed   One level home   Social Determinants of Health   Financial Resource Strain: Not on file  Food Insecurity: Not on file  Transportation Needs: Not on file  Physical Activity: Not on file  Stress: Not on file  Social Connections: Not on file     Family History: The patient's family history includes Breast cancer in her cousin; Cancer in her mother; Heart disease in her father. There is no history of Heart attack.  ROS:   Please see the history of present illness.     All other systems reviewed and are negative.  EKGs/Labs/Other Studies Reviewed:    The following studies were reviewed today: Cardiac Studies & Procedures     STRESS TESTS  MYOCARDIAL PERFUSION IMAGING 06/09/2021  Narrative   The study is normal. The study is low risk.   No ST deviation was noted.   LV perfusion is normal. There is no evidence of ischemia. There is no evidence of infarction.   Left ventricular function is normal. Nuclear stress EF: 74 %. The left ventricular ejection fraction is hyperdynamic (>65%). End diastolic cavity size is normal. End systolic cavity size is normal.   Prior study available for comparison from 11/26/2014.  Fixed perfusion defect in  apical inferior wall and apex with normal wall motion consistent with artifact Low risk study   ECHOCARDIOGRAM  ECHOCARDIOGRAM COMPLETE 03/21/2022  Narrative ECHOCARDIOGRAM REPORT    Patient Name:   Anne [name] Date of Exam: 03/21/2022 Medical Rec #:  [id number]           Height:       67.0 in Accession #:    [id number]          Weight:       154.4 lb Date of Birth:  Jul 25, 1930  solution Place 1 drop into both eyes at bedtime.   metoprolol succinate (TOPROL-XL) 25 MG 24 hr tablet TAKE 1 TABLET (25 MG TOTAL) BY MOUTH DAILY.   Multiple Vitamin (MULTIVITAMIN) tablet Take 1 tablet by mouth every morning.    nitroGLYCERIN (NITROSTAT) 0.4 MG SL tablet Place 1 tablet (0.4 mg total) under the tongue every 5 (five) minutes as needed for chest pain (Up to 3 doses in 15 minutes.).   rosuvastatin (CRESTOR) 20 MG tablet TAKE 1 TABLET BY MOUTH EVERY DAY     Allergies:   Other, Shellfish allergy, Iodine, Latex, Methocarbamol, Pravastatin, and Adhesive [tape]   Social History   Socioeconomic History   Marital status: Widowed    Spouse name: Not on  file   Number of children: Not on file   Years of education: Not on file   Highest education level: Not on file  Occupational History   Occupation: retired  Tobacco Use   Smoking status: Former    Years: 4    Types: Cigarettes    Start date: 09-28-1951    Quit date: 1960    Years since quitting: 64.4   Smokeless tobacco: Never   Tobacco comments:    social smoker  Building [name] engineer Use: Never used  Substance and Sexual Activity   Alcohol use: No    Alcohol/week: 0.0 standard drinks of alcohol   Drug use: No   Sexual activity: Never    Birth control/protection: Surgical  Other Topics Concern   Not on file  Social History Narrative   Retired Public relations account executive.  Lives alone, husband passed away in 09/27/2016.  No children.   Right handed   One level home   Social Determinants of Health   Financial Resource Strain: Not on file  Food Insecurity: Not on file  Transportation Needs: Not on file  Physical Activity: Not on file  Stress: Not on file  Social Connections: Not on file     Family History: The patient's family history includes Breast cancer in her cousin; Cancer in her mother; Heart disease in her father. There is no history of Heart attack.  ROS:   Please see the history of present illness.     All other systems reviewed and are negative.  EKGs/Labs/Other Studies Reviewed:    The following studies were reviewed today: Cardiac Studies & Procedures     STRESS TESTS  MYOCARDIAL PERFUSION IMAGING 06/09/2021  Narrative   The study is normal. The study is low risk.   No ST deviation was noted.   LV perfusion is normal. There is no evidence of ischemia. There is no evidence of infarction.   Left ventricular function is normal. Nuclear stress EF: 74 %. The left ventricular ejection fraction is hyperdynamic (>65%). End diastolic cavity size is normal. End systolic cavity size is normal.   Prior study available for comparison from 11/26/2014.  Fixed perfusion defect in  apical inferior wall and apex with normal wall motion consistent with artifact Low risk study   ECHOCARDIOGRAM  ECHOCARDIOGRAM COMPLETE 03/21/2022  Narrative ECHOCARDIOGRAM REPORT    Patient Name:   Anne [name] Date of Exam: 03/21/2022 Medical Rec #:  [id number]           Height:       67.0 in Accession #:    [id number]          Weight:       154.4 lb Date of Birth:  Jul 25, 1930  solution Place 1 drop into both eyes at bedtime.   metoprolol succinate (TOPROL-XL) 25 MG 24 hr tablet TAKE 1 TABLET (25 MG TOTAL) BY MOUTH DAILY.   Multiple Vitamin (MULTIVITAMIN) tablet Take 1 tablet by mouth every morning.    nitroGLYCERIN (NITROSTAT) 0.4 MG SL tablet Place 1 tablet (0.4 mg total) under the tongue every 5 (five) minutes as needed for chest pain (Up to 3 doses in 15 minutes.).   rosuvastatin (CRESTOR) 20 MG tablet TAKE 1 TABLET BY MOUTH EVERY DAY     Allergies:   Other, Shellfish allergy, Iodine, Latex, Methocarbamol, Pravastatin, and Adhesive [tape]   Social History   Socioeconomic History   Marital status: Widowed    Spouse name: Not on  file   Number of children: Not on file   Years of education: Not on file   Highest education level: Not on file  Occupational History   Occupation: retired  Tobacco Use   Smoking status: Former    Years: 4    Types: Cigarettes    Start date: 09-28-1951    Quit date: 1960    Years since quitting: 64.4   Smokeless tobacco: Never   Tobacco comments:    social smoker  Building [name] engineer Use: Never used  Substance and Sexual Activity   Alcohol use: No    Alcohol/week: 0.0 standard drinks of alcohol   Drug use: No   Sexual activity: Never    Birth control/protection: Surgical  Other Topics Concern   Not on file  Social History Narrative   Retired Public relations account executive.  Lives alone, husband passed away in 09/27/2016.  No children.   Right handed   One level home   Social Determinants of Health   Financial Resource Strain: Not on file  Food Insecurity: Not on file  Transportation Needs: Not on file  Physical Activity: Not on file  Stress: Not on file  Social Connections: Not on file     Family History: The patient's family history includes Breast cancer in her cousin; Cancer in her mother; Heart disease in her father. There is no history of Heart attack.  ROS:   Please see the history of present illness.     All other systems reviewed and are negative.  EKGs/Labs/Other Studies Reviewed:    The following studies were reviewed today: Cardiac Studies & Procedures     STRESS TESTS  MYOCARDIAL PERFUSION IMAGING 06/09/2021  Narrative   The study is normal. The study is low risk.   No ST deviation was noted.   LV perfusion is normal. There is no evidence of ischemia. There is no evidence of infarction.   Left ventricular function is normal. Nuclear stress EF: 74 %. The left ventricular ejection fraction is hyperdynamic (>65%). End diastolic cavity size is normal. End systolic cavity size is normal.   Prior study available for comparison from 11/26/2014.  Fixed perfusion defect in  apical inferior wall and apex with normal wall motion consistent with artifact Low risk study   ECHOCARDIOGRAM  ECHOCARDIOGRAM COMPLETE 03/21/2022  Narrative ECHOCARDIOGRAM REPORT    Patient Name:   Anne [name] Date of Exam: 03/21/2022 Medical Rec #:  [id number]           Height:       67.0 in Accession #:    [id number]          Weight:       154.4 lb Date of Birth:  Jul 25, 1930  Cardiology Office Note:    Date:  09/04/2022   ID:  Anne [name], DOB 08-10-1930, MRN [id number]  PCP:  Genelle Gather, DO   [facility] Providers Cardiologist:  Donato Schultz, MD     Referring MD: Thana Ates, MD    History of Present Illness:    Anne [name] is a 89 y.o. female former patient of Dr. Verdis Prime here with nonobstructive coronary artery disease here for follow-up atrial fibrillation.  Has been seen in the A-fib clinic.  Currently in sinus rhythm.  Doing well.  On anticoagulation.  Medications reviewed.  Diagnosed with atrial fibrillation on 07/22/22 after presenting to [facility] ED with symptoms of shortness of breath and cough. She was in Afib without RVR and discharged on Eliquis 2.5 mg BID. Patient is on Eliquis 2.5 mg BID for a CHADS2VASC score of 4.   On evaluation 07/31/22, she is currently in SR. She was sick and on prednisone which led to ED evaluation when she was found to be Afib that was rate controlled. She converted to SR spontaneously but does not know when this happened. She appears to not have any cardiac awareness of her Afib.  Had a cold for so long, has to clear her throat and drainage.   Multiple records reviewed lab work reviewed studies reviewed  Past Medical History:  Diagnosis Date   Allergic rhinitis    Arthritis    Asthma    Atypical chest pain    Collapse of right lung    Dyslipidemia    Gastroesophageal reflux disease    Glaucoma    Hematuria    Hyperlipidemia    Lung nodules    PVC's (premature ventricular contractions) 02/21/2021   Sinus bradycardia 02/21/2021    Past Surgical History:  Procedure Laterality Date   ANAL FISSURE REPAIR     BREAST BIOPSY Right    CATARACT EXTRACTION Bilateral    MASS EXCISION  04/11/2012   Procedure: EXCISION MASS;  Surgeon: Nicki Reaper, MD;  Location: [facility];  Service: Orthopedics;  Laterality: Right;  Excision of mass right upper arm    MEDIASTINOSCOPY  4/11   NASAL SINUS SURGERY     TOTAL ABDOMINAL HYSTERECTOMY W/ BILATERAL SALPINGOOPHORECTOMY      Current Medications: Current Meds  Medication Sig   albuterol (PROAIR HFA) 108 (90 Base) MCG/ACT inhaler INHALE 1-2 PUFFS INTO THE LUNGS EVERY 6 (SIX) HOURS AS NEEDED FOR WHEEZING OR SHORTNESS OF BREATH.   amLODipine (NORVASC) 5 MG tablet Take 1 tablet by mouth daily.   apixaban (ELIQUIS) 5 MG TABS tablet Take 1 tablet (5 mg total) by mouth 2 (two) times daily.   Calcium Carb-Cholecalciferol 600-200 MG-UNIT TABS Take 1 tablet by mouth daily.   cetirizine (ZYRTEC) 10 MG tablet Take 10 mg by mouth every morning.   dorzolamide-timolol (COSOPT) 2-0.5 % ophthalmic solution Place 1 drop into the right eye 2 (two) times daily.   ferrous sulfate 325 (65 FE) MG tablet Take 325 mg by mouth 3 (three) times a week. Take on MWF  Take once a week reported on: 01/01/2022   fluticasone (FLONASE) 50 MCG/ACT nasal spray Place 1 spray into both nostrils 2 (two) times daily as needed for allergies.    Fluticasone-Umeclidin-Vilant (TRELEGY ELLIPTA) 100-62.5-25 MCG/ACT AEPB Inhale 1 puff into the lungs daily.   gabapentin (NEURONTIN) 300 MG capsule Take 1 capsule (300 mg total) by mouth at bedtime.   latanoprost (XALATAN) 0.005 % ophthalmic  Cardiology Office Note:    Date:  09/04/2022   ID:  Anne [name], DOB 08-10-1930, MRN [id number]  PCP:  Genelle Gather, DO   [facility] Providers Cardiologist:  Donato Schultz, MD     Referring MD: Thana Ates, MD    History of Present Illness:    Anne [name] is a 89 y.o. female former patient of Dr. Verdis Prime here with nonobstructive coronary artery disease here for follow-up atrial fibrillation.  Has been seen in the A-fib clinic.  Currently in sinus rhythm.  Doing well.  On anticoagulation.  Medications reviewed.  Diagnosed with atrial fibrillation on 07/22/22 after presenting to [facility] ED with symptoms of shortness of breath and cough. She was in Afib without RVR and discharged on Eliquis 2.5 mg BID. Patient is on Eliquis 2.5 mg BID for a CHADS2VASC score of 4.   On evaluation 07/31/22, she is currently in SR. She was sick and on prednisone which led to ED evaluation when she was found to be Afib that was rate controlled. She converted to SR spontaneously but does not know when this happened. She appears to not have any cardiac awareness of her Afib.  Had a cold for so long, has to clear her throat and drainage.   Multiple records reviewed lab work reviewed studies reviewed  Past Medical History:  Diagnosis Date   Allergic rhinitis    Arthritis    Asthma    Atypical chest pain    Collapse of right lung    Dyslipidemia    Gastroesophageal reflux disease    Glaucoma    Hematuria    Hyperlipidemia    Lung nodules    PVC's (premature ventricular contractions) 02/21/2021   Sinus bradycardia 02/21/2021    Past Surgical History:  Procedure Laterality Date   ANAL FISSURE REPAIR     BREAST BIOPSY Right    CATARACT EXTRACTION Bilateral    MASS EXCISION  04/11/2012   Procedure: EXCISION MASS;  Surgeon: Nicki Reaper, MD;  Location: [facility];  Service: Orthopedics;  Laterality: Right;  Excision of mass right upper arm    MEDIASTINOSCOPY  4/11   NASAL SINUS SURGERY     TOTAL ABDOMINAL HYSTERECTOMY W/ BILATERAL SALPINGOOPHORECTOMY      Current Medications: Current Meds  Medication Sig   albuterol (PROAIR HFA) 108 (90 Base) MCG/ACT inhaler INHALE 1-2 PUFFS INTO THE LUNGS EVERY 6 (SIX) HOURS AS NEEDED FOR WHEEZING OR SHORTNESS OF BREATH.   amLODipine (NORVASC) 5 MG tablet Take 1 tablet by mouth daily.   apixaban (ELIQUIS) 5 MG TABS tablet Take 1 tablet (5 mg total) by mouth 2 (two) times daily.   Calcium Carb-Cholecalciferol 600-200 MG-UNIT TABS Take 1 tablet by mouth daily.   cetirizine (ZYRTEC) 10 MG tablet Take 10 mg by mouth every morning.   dorzolamide-timolol (COSOPT) 2-0.5 % ophthalmic solution Place 1 drop into the right eye 2 (two) times daily.   ferrous sulfate 325 (65 FE) MG tablet Take 325 mg by mouth 3 (three) times a week. Take on MWF  Take once a week reported on: 01/01/2022   fluticasone (FLONASE) 50 MCG/ACT nasal spray Place 1 spray into both nostrils 2 (two) times daily as needed for allergies.    Fluticasone-Umeclidin-Vilant (TRELEGY ELLIPTA) 100-62.5-25 MCG/ACT AEPB Inhale 1 puff into the lungs daily.   gabapentin (NEURONTIN) 300 MG capsule Take 1 capsule (300 mg total) by mouth at bedtime.   latanoprost (XALATAN) 0.005 % ophthalmic

## 2022-09-20 ENCOUNTER — Telehealth: Payer: Self-pay | Admitting: Internal Medicine

## 2022-09-20 NOTE — Telephone Encounter (Signed)
Spoke with patient. States has some lingering symptoms related to a cold she had 2-3 weeks ago. Having some drainage in her throat. She wants to know if she would be okay to get RSV vaccination?  Dr. Marchelle Gearing please advise?

## 2022-09-20 NOTE — Telephone Encounter (Signed)
Patient would like a nurse to call regarding whether she should get the RSV vaccine.  She stated she has some drainage and is not sure if she should get the shot at this time.  Please advise and call patient to discuss at [phone]

## 2022-09-20 NOTE — Telephone Encounter (Signed)
Ok to get RSV vaccination

## 2022-09-21 NOTE — Telephone Encounter (Signed)
ATC X1 LVM for patient to call the office back. Please advise Dr. Marchelle Gearing has okayd her to get RSV vaccine

## 2022-09-24 NOTE — Telephone Encounter (Signed)
Called pt and let her know Dr Marchelle Gearing said it was okay for her to get the RSV vaccine. Pt verbalized understanding nothing further needed.

## 2022-10-02 DIAGNOSIS — H6120 Impacted cerumen, unspecified ear: Secondary | ICD-10-CM | POA: Diagnosis not present

## 2022-10-02 DIAGNOSIS — J45909 Unspecified asthma, uncomplicated: Secondary | ICD-10-CM | POA: Diagnosis not present

## 2022-10-02 DIAGNOSIS — B370 Candidal stomatitis: Secondary | ICD-10-CM | POA: Diagnosis not present

## 2022-10-18 DIAGNOSIS — R300 Dysuria: Secondary | ICD-10-CM | POA: Diagnosis not present

## 2022-10-18 DIAGNOSIS — R351 Nocturia: Secondary | ICD-10-CM | POA: Diagnosis not present

## 2022-10-22 ENCOUNTER — Telehealth: Payer: Self-pay | Admitting: Cardiology

## 2022-10-22 NOTE — Telephone Encounter (Signed)
Pt c/o of Chest Pain: STAT if CP now or developed within 24 hours  1. Are you having CP right now? Yes   2. Are you experiencing any other symptoms (ex. SOB, nausea, vomiting, sweating)? Tired and SOB   3. How long have you been experiencing CP? Last couple of weeks   4. Is your CP continuous or coming and going? Coming and going   5. Have you taken Nitroglycerin? No  ?

## 2022-10-22 NOTE — Telephone Encounter (Signed)
Patient call sent straight to triage. Patient complaining of chest pressure, chest congestion, feels like it is heavy, SOB, and low energy. Patient stated chest pressure has been coming and going for last couple of weeks, and feels like it is due to stress. Patient's recent BP 125/54, HR 61, and O2 99%. Patient does have A. FIB and she is taking her eliquis. Encouraged patient to go to ED since she is presently having SOB and chest pressure. Will send message to Dr. Anne Fu for further advisement.

## 2022-11-23 ENCOUNTER — Encounter: Payer: Self-pay | Admitting: Internal Medicine

## 2022-11-23 ENCOUNTER — Ambulatory Visit: Payer: Medicare PPO | Admitting: Internal Medicine

## 2022-11-23 VITALS — BP 100/60 | HR 57 | Ht 67.0 in | Wt 149.6 lb

## 2022-11-23 DIAGNOSIS — J4530 Mild persistent asthma, uncomplicated: Secondary | ICD-10-CM

## 2022-11-23 DIAGNOSIS — Z7185 Encounter for immunization safety counseling: Secondary | ICD-10-CM

## 2022-11-23 DIAGNOSIS — Z9109 Other allergy status, other than to drugs and biological substances: Secondary | ICD-10-CM

## 2022-11-23 NOTE — Patient Instructions (Addendum)
ICD-10-CM   1. Mild persistent asthma without complication  J45.30     2. Environmental allergies  Z91.09          Shortness of breath overall stable and asthma under good control CXR 03/29/22 clear   Plan - STOP TRELEGY -= RESTART Breo 100 strength daily  - albuterol as needed -Continue Zyrtec as before -Continue other medications as before - RSV vaccie and high dose flu shot in the fall of 2024 - Monitor symptoms  - if frequent flare ups or worsening shortness of breath can consider rechallenge trelegy or injection xolair  Follow-up - Visit Dr Marchelle Gearing - 6-9 months or sooner if needed

## 2022-11-23 NOTE — Progress Notes (Signed)
07/14/2021: Today-follow-up Patient presents today for follow-up after pulmonary function  testing. She has a very mild obstructive airway disease with increased lung volumes and without significant bronchodilator response. No evidence of restriction. Today, she reports feeling well. Improved since last visit. Still having some SOB upon exertion, progressive over the past year. Very minimal wheeze at time. Cough has improved. Denies any leg swelling, orthopnea, PND, chest pain. She continues on [place] daily. She did receive a letter from her insurance company who said they do not cover Breo anymore. She rarely has to use albuterol. Continues on Zyrtec daily for allergies, which are well controlled.    Latest Reference Range & Units 06/16/21 10:12  Sheep Sorrel IgE kU/L <0.10  Pecan/Hickory Tree IgE kU/L 0.11 (H)  IgE (Immunoglobulin E), Serum <OR=114 kU/L <OR=114 kU/L 314 (H) 288 (H)  Allergen, D pternoyssinus,d7 kU/L <0.10  Cat Dander kU/L <0.10  Dog Dander kU/L <0.10  [place] Grass kU/L <0.10  Johnson Grass kU/L <0.10  Timothy Grass kU/L <0.10  Cockroach kU/L <0.10  Aspergillus fumigatus, m3 kU/L 0.11 (H)  Allergen, Comm Silver Charletta Cousin, t9 kU/L <0.10  Allergen, Cottonwood, t14 kU/L 0.16 (H)  Elm IgE kU/L 0.16 (H)  Allergen, Mulberry, t76 kU/L <0.10  Allergen, Oak,t7 kU/L 0.10 (H)  COMMON RAGWEED (SHORT) (W1) IGE kU/L <0.10  Allergen, Mouse Urine Protein, e78 kU/L <0.10  D. farinae kU/L <0.10  Allergen, Cedar tree, t12 kU/L <0.10  Box Elder IgE kU/L <0.10  Rough Pigweed  IgE kU/L <0.10  (H): Data is abnormally high   OV 09/05/2021  Subjective:  Patient ID: Anne [name], female , DOB: Jun 12, 1930 , age 89 y.o. , MRN: [id number] , ADDRESS: [address] [place] [address] PCP Merlene Laughter, MD Patient Care Team: Merlene Laughter, MD as PCP - General (Internal Medicine) Lyn Records, MD as PCP - Cardiology (Cardiology) Cindee Salt, MD as Consulting Physician (Orthopedic Surgery) Glendale Chard, DO as Consulting Physician (Neurology)  This Provider for this  visit: Treatment Team:  Attending Provider: Kalman Shan, MD    09/05/2021 -   Chief Complaint  Patient presents with   Follow-up    Pt states she has been doing okay since last visit.    HPI Anne [name] 89 y.o. -presents for follow-up.  After last visit because of worsening dyspnea on exertion we ordered RAST allergy panel.  This shows slightly high IgE level and some positivity to environmental allergies.  Presents for follow-up.  She then saw nurse practitioner.  Who started on Trelegy but she called back saying this did not help.  At this point in time she states that she is back to baseline but she still wanting to know if I recommended Trelegy.  I did advise that there is no point in trying Trelegy again.  Explained to her what Trelegy is compared to [facility].  Explained that because she did not feel better she is unlikely to feel better again with a rechallenge.  Explained the multifactorial nature of her dyspnea.  Did explain to her positive allergies.  We did discuss that be options such as Xolair if she were to get worse and having recurrent exacerbations or even Singulair.  OV 05/01/2022  Subjective:  Patient ID: Anne [name], female , DOB: 06/11/1930 , age 53 y.o. , MRN: [id number] , ADDRESS: [address] [place] [address] PCP Thana Ates, MD Patient Care Team: Thana Ates, MD as PCP - General (Internal Medicine) Lyn Records,  She is social distancing, masking and has had a vaccines.  She continues to be active.  ACT score is 25 and shows good asthma control.    Asthma Control Test ACT Total Score  05/06/2020 25       03/09/2021 Follow up : Asthma  Patient complains of 3 days of increased cough and congestion. Mucus is thick and hard to get up but remains clear. Remains active. Drives and lives alone at home . Has some mild sinus drainage  Remains on BREO daily . No increased Albuterol inhaler use. No wheezing .  No fever,edema , abd pain , n/v/d. Does have some epigastric burning from time to time. No dysphagia.   Recent cardiac workup last month . Echo 02/20/21 showed EF 60%, Grade 1 DD ,Normal PAP  Chest xray nad Coronary CT with no lung issues , felt CAD stable -no significant stenosis per cards note.  Placed on PPI from PCP but has not started. Cardiac monitor to be placed.  TEST/EVENTS :  Exhaled nitric oxide test May 2018: 17 ppb High resolution CT chest August 2016 showed no evidence of interstitial lung disease, chronic collapse of right middle lobe MEdiastinaoscopy 07/27/2009 - Benign node of 4R and 4L. ANthracotoic pigment +. No granuoma. Non malignant PFT 10/28/2014 FEV1  94, ratio 54, FVC 135, no significant bronchodilator response, mid flows reversibility, DLCO 65%  OV 06/16/2021  Subjective:  Patient ID: Anne [name], female , DOB: 23-Jan-1931 , age 71 y.o. , MRN: [id number] , ADDRESS: [address] [place] [address] PCP Merlene Laughter, MD Patient Care Team: Merlene Laughter, MD as PCP - General (Internal Medicine) Lyn Records, MD as PCP - Cardiology (Cardiology) Cindee Salt, MD as Consulting Physician (Orthopedic Surgery) Glendale Chard, DO as Consulting Physician (Neurology)  This Provider for this visit: Treatment Team:  Attending Provider: Noemi Chapel, NP    06/16/2021 -   Chief Complaint  Patient presents with   Follow-up    Pt states she is still having complaints of SOB. Has an occasional cough mainly at night but not of any concern. Denies any complaints of wheezing.   Female never smoker followed for mild persistent asthma  History of diastolic dysfunction  Known chronic right middle lobe collapse/ lymphadenopathy -s/p 07/2007 FOB +psuedomonas - MEdiastinaoscopy 07/27/2009 - Benign node of 4R and 4L. ANthracotoic pigment +. No granuoma. Non malignant  - last cxr 2019 and clear  HPI Anne [name] 89 y.o. -presents for an acute visit.  I personally saw her in February 2022 for her mild asthma and she was stable.  After that she saw a nurse practitioner Rikki Spearing in December 2022 for congestion over 3 days and was given 3 days prednisone.  Meanwhile she is continued her Breo.  However she tells me that ever since Thanksgiving 2022 she has been dealing with congestion and chest that she describes it as a burning and a pain.  Its not present daily.  It is present intermittently on and off.  Definitely she feels different.  Yet at the same time she states she has no wheezing or cough.  She does have some paroxysmal nocturnal dyspnea.  The most recent one was last night but she is unable to quantify how severe this is.   She also admits to acid reflux but then she says this is on and off.  She is not able to tell me if this is worse than before.  She also reports shortness of  HPI Chief Complaint  Patient presents with   Acute Visit    cough     Referring provider: Merlene Laughter, MD  HPI: 89 year old female never smoker followed for mild persistent asthma History of diastolic dysfunction Known chronic right middle lobe collapse/ lymphadenopathy -s/p 07/2007 FOB +psuedomonas   Test Exhaled nitric oxide test May 2018: 17 ppb High resolution CT chest August 2016 showed no evidence of interstitial lung disease, chronic collapse of right middle lobe MEdiastinaoscopy 07/27/2009 - Benign node of 4R and 4L. ANthracotoic pigment +. No granuoma. Non malignant PFT 10/28/2014 FEV1 94, ratio 54, FVC 135, no significant bronchodilator response, mid flows reversibility, DLCO 65%    04/25/2017 Acute OV : Asthma  Patient presents for an acute office visit.  She complains of 3 weeks of cough ,congestion sinus congestion, drainage , wheezing . Seen PCP dx w/ bronchitis , rx  , Doxycycline and Prednisone . Feels some better but still has chest congestion and tightness .  She remains on Breo daily.    Appetite is okay w/ no nv/d.  No chest pain, orthopnea or ede   OV 06/05/2017  Chief Complaint  Patient presents with   Follow-up    Pt states she has been doing good since last visit. Pt states she does become SOB with exertion. Denies any cough or CP.    Fu Mild persiostent asthma with associated diastolic dysfunction  Contines on broe. Overall stable. But last  4 month has has had 2 surgeries for hammer toe on left food. So less active. Correlating with this more dyspnea on exertion than usual. Also, found to have mold in the bathroom and professionally removed . Has not affected her but she is asking for feno and walk test both of which aare normal. She wants sample breo       OV 12/17/2017  Subjective:  Patient ID: Anne [name], female , DOB: 02-02-1931 , age 59 y.o. , MRN: [id number] , ADDRESS: [address] [place] [address]   12/17/2017 -    Chief Complaint  Patient presents with   Follow-up    Pt states she has been doing well since last visit and denies any complaints.   Fu Mild persiostent asthma with associated diastolic dysfunction  HPI Anne [name] 89 y.o. -  Mild persistent asthma follow-up. Recently she was switched to Brio on Qvar. This is helped his symptoms. In addition she is in the mold in the house.She feels great. She is somewhat she has done well. No interim asthma exacerbations. She will have a high dose flu shot today. There are really no other issues other than some mild recurrence of costochondral chest pain in the last few days is a recurrent problem for her.     Results for DILCIA, CLEVERLY (MRN [id number]) as of 06/05/2017 14:14  Ref. Range 08/07/2016 12:01 02/12/2017 11:30 06/05/2017 13:04  Nitric Oxide Unknown 17 18 20    OV 01/15/2019  Subjective:  Patient ID: Anne [name], female , DOB: [birth year] , age 18 y.o. , MRN: [id number] , ADDRESS: [address] [place] [address]   01/15/2019 -   Chief Complaint  Patient presents with   Follow-up    Pt states her breathing is doing well. Pt denies any increase in SOB, cough, CP/tightness, f/c/s, body aches.    89 year old female never smoker followed for mild persistent asthma History of diastolic dysfunction Known chronic right middle lobe collapse/ lymphadenopathy -s/p 07/2007 FOB +psuedomonas -  She is social distancing, masking and has had a vaccines.  She continues to be active.  ACT score is 25 and shows good asthma control.    Asthma Control Test ACT Total Score  05/06/2020 25       03/09/2021 Follow up : Asthma  Patient complains of 3 days of increased cough and congestion. Mucus is thick and hard to get up but remains clear. Remains active. Drives and lives alone at home . Has some mild sinus drainage  Remains on BREO daily . No increased Albuterol inhaler use. No wheezing .  No fever,edema , abd pain , n/v/d. Does have some epigastric burning from time to time. No dysphagia.   Recent cardiac workup last month . Echo 02/20/21 showed EF 60%, Grade 1 DD ,Normal PAP  Chest xray nad Coronary CT with no lung issues , felt CAD stable -no significant stenosis per cards note.  Placed on PPI from PCP but has not started. Cardiac monitor to be placed.  TEST/EVENTS :  Exhaled nitric oxide test May 2018: 17 ppb High resolution CT chest August 2016 showed no evidence of interstitial lung disease, chronic collapse of right middle lobe MEdiastinaoscopy 07/27/2009 - Benign node of 4R and 4L. ANthracotoic pigment +. No granuoma. Non malignant PFT 10/28/2014 FEV1  94, ratio 54, FVC 135, no significant bronchodilator response, mid flows reversibility, DLCO 65%  OV 06/16/2021  Subjective:  Patient ID: Anne [name], female , DOB: 23-Jan-1931 , age 71 y.o. , MRN: [id number] , ADDRESS: [address] [place] [address] PCP Merlene Laughter, MD Patient Care Team: Merlene Laughter, MD as PCP - General (Internal Medicine) Lyn Records, MD as PCP - Cardiology (Cardiology) Cindee Salt, MD as Consulting Physician (Orthopedic Surgery) Glendale Chard, DO as Consulting Physician (Neurology)  This Provider for this visit: Treatment Team:  Attending Provider: Noemi Chapel, NP    06/16/2021 -   Chief Complaint  Patient presents with   Follow-up    Pt states she is still having complaints of SOB. Has an occasional cough mainly at night but not of any concern. Denies any complaints of wheezing.   Female never smoker followed for mild persistent asthma  History of diastolic dysfunction  Known chronic right middle lobe collapse/ lymphadenopathy -s/p 07/2007 FOB +psuedomonas - MEdiastinaoscopy 07/27/2009 - Benign node of 4R and 4L. ANthracotoic pigment +. No granuoma. Non malignant  - last cxr 2019 and clear  HPI Anne [name] 89 y.o. -presents for an acute visit.  I personally saw her in February 2022 for her mild asthma and she was stable.  After that she saw a nurse practitioner Rikki Spearing in December 2022 for congestion over 3 days and was given 3 days prednisone.  Meanwhile she is continued her Breo.  However she tells me that ever since Thanksgiving 2022 she has been dealing with congestion and chest that she describes it as a burning and a pain.  Its not present daily.  It is present intermittently on and off.  Definitely she feels different.  Yet at the same time she states she has no wheezing or cough.  She does have some paroxysmal nocturnal dyspnea.  The most recent one was last night but she is unable to quantify how severe this is.   She also admits to acid reflux but then she says this is on and off.  She is not able to tell me if this is worse than before.  She also reports shortness of  MD as PCP - Cardiology (Cardiology) Cindee Salt, MD as Consulting Physician (Orthopedic Surgery) Glendale Chard, DO as Consulting Physician (Neurology)  This Provider for this visit: Treatment Team:  Attending Provider: Kalman Shan, MD    05/01/2022 -   Chief Complaint  Patient presents with   Follow-up    Breathing has improved since her last visit. She prefers [employer] over [employer]. ACT= 19.      HPI Anne [name] 7 y.o. -returns for follow-up.  In December 2023 she had shellfish reaction with wheezing  was given prednisone.  March 21, 2022 she saw Dr. Illene Bolus.  Chest x-ray I reviewed from that day was clear.  Currently she is feeling well.  She is back on her Breo.  She has no issues.  She is up-to-date with the vaccines except RSV.  Educated about RSV vaccine.  She is not taking Mucinex and overall feeling stable.   OV 11/23/2022  Subjective:  Patient ID: Anne [name], female , DOB: Aug 21, 1930 , age 78 y.o. , MRN: [id number] , ADDRESS: [address] [place] [address] PCP Genelle Gather, DO Patient Care Team: Genelle Gather, DO as PCP - General (Family Medicine) Jake Bathe, MD as PCP - Cardiology (Cardiology) Cindee Salt, MD as Consulting Physician (Orthopedic Surgery) Glendale Chard, DO as Consulting Physician (Neurology)  This Provider for this visit: Treatment Team:  Attending Provider: Kalman Shan, MD    11/23/2022 -   Chief Complaint  Patient presents with   Follow-up    F/up, questions about inhalers.    Female never smoker followed for mild persistent asthma  - RAST allergy test positive March 2023  History of diastolic dysfunction  Known chronic right middle lobe collapse/ lymphadenopathy -s/p 07/2007 FOB +psuedomonas - MEdiastinaoscopy 07/27/2009 - Benign node of 4R and 4L. ANthracotoic pigment +. No granuoma. Non malignant  -present on CT Feb 2023   Multifactorial dyspnea on exertion  Shellfish allergy with respiratory wheezing December 2023   HPI Anne [name] 89 y.o. -last seen in January 2024.  Then she called in April 2024 wanting to switch to Trelegy.  We switched her to Trelegy low-dose.  She comes back now saying Interim Health status: No new complaints No new medical problems. No new surgeries. No ER visits. No Urgent care visits. No changes to medications.  She is asking to go back on Breo.  She says she did not find the Trelegy particularly helpful.  She also feels it was a little bit too excessive.  She is also  nervous at the same time of going back to [facility] saying that she worries of asthma we will go out of control.  I did tell her that so many options available and scaling up and down is definitely possible based on her symptoms.  Based on this she is willing to go back on Breo.  We discussed high dose versus low-dose.  Previously she was on 100 send which is a low-dose.  Will go back to that.   PFT     Latest Ref Rng & Units 07/14/2021   10:54 AM 10/28/2014   10:13 AM  ILD indicators  FVC-Pre L 2.69  2.83   FVC-Predicted Pre % 130  123   FVC-Post L 2.70  3.13   FVC-Predicted Post % 131  135   TLC L 6.36  6.74   TLC Predicted % 115  122   DLCO uncorrected ml/min/mmHg 17.31  18.63   DLCO  07/14/2021: Today-follow-up Patient presents today for follow-up after pulmonary function  testing. She has a very mild obstructive airway disease with increased lung volumes and without significant bronchodilator response. No evidence of restriction. Today, she reports feeling well. Improved since last visit. Still having some SOB upon exertion, progressive over the past year. Very minimal wheeze at time. Cough has improved. Denies any leg swelling, orthopnea, PND, chest pain. She continues on [place] daily. She did receive a letter from her insurance company who said they do not cover Breo anymore. She rarely has to use albuterol. Continues on Zyrtec daily for allergies, which are well controlled.    Latest Reference Range & Units 06/16/21 10:12  Sheep Sorrel IgE kU/L <0.10  Pecan/Hickory Tree IgE kU/L 0.11 (H)  IgE (Immunoglobulin E), Serum <OR=114 kU/L <OR=114 kU/L 314 (H) 288 (H)  Allergen, D pternoyssinus,d7 kU/L <0.10  Cat Dander kU/L <0.10  Dog Dander kU/L <0.10  [place] Grass kU/L <0.10  Johnson Grass kU/L <0.10  Timothy Grass kU/L <0.10  Cockroach kU/L <0.10  Aspergillus fumigatus, m3 kU/L 0.11 (H)  Allergen, Comm Silver Charletta Cousin, t9 kU/L <0.10  Allergen, Cottonwood, t14 kU/L 0.16 (H)  Elm IgE kU/L 0.16 (H)  Allergen, Mulberry, t76 kU/L <0.10  Allergen, Oak,t7 kU/L 0.10 (H)  COMMON RAGWEED (SHORT) (W1) IGE kU/L <0.10  Allergen, Mouse Urine Protein, e78 kU/L <0.10  D. farinae kU/L <0.10  Allergen, Cedar tree, t12 kU/L <0.10  Box Elder IgE kU/L <0.10  Rough Pigweed  IgE kU/L <0.10  (H): Data is abnormally high   OV 09/05/2021  Subjective:  Patient ID: Anne [name], female , DOB: Jun 12, 1930 , age 89 y.o. , MRN: [id number] , ADDRESS: [address] [place] [address] PCP Merlene Laughter, MD Patient Care Team: Merlene Laughter, MD as PCP - General (Internal Medicine) Lyn Records, MD as PCP - Cardiology (Cardiology) Cindee Salt, MD as Consulting Physician (Orthopedic Surgery) Glendale Chard, DO as Consulting Physician (Neurology)  This Provider for this  visit: Treatment Team:  Attending Provider: Kalman Shan, MD    09/05/2021 -   Chief Complaint  Patient presents with   Follow-up    Pt states she has been doing okay since last visit.    HPI Anne [name] 89 y.o. -presents for follow-up.  After last visit because of worsening dyspnea on exertion we ordered RAST allergy panel.  This shows slightly high IgE level and some positivity to environmental allergies.  Presents for follow-up.  She then saw nurse practitioner.  Who started on Trelegy but she called back saying this did not help.  At this point in time she states that she is back to baseline but she still wanting to know if I recommended Trelegy.  I did advise that there is no point in trying Trelegy again.  Explained to her what Trelegy is compared to [facility].  Explained that because she did not feel better she is unlikely to feel better again with a rechallenge.  Explained the multifactorial nature of her dyspnea.  Did explain to her positive allergies.  We did discuss that be options such as Xolair if she were to get worse and having recurrent exacerbations or even Singulair.  OV 05/01/2022  Subjective:  Patient ID: Anne [name], female , DOB: 06/11/1930 , age 53 y.o. , MRN: [id number] , ADDRESS: [address] [place] [address] PCP Thana Ates, MD Patient Care Team: Thana Ates, MD as PCP - General (Internal Medicine) Lyn Records,  07/14/2021: Today-follow-up Patient presents today for follow-up after pulmonary function  testing. She has a very mild obstructive airway disease with increased lung volumes and without significant bronchodilator response. No evidence of restriction. Today, she reports feeling well. Improved since last visit. Still having some SOB upon exertion, progressive over the past year. Very minimal wheeze at time. Cough has improved. Denies any leg swelling, orthopnea, PND, chest pain. She continues on [place] daily. She did receive a letter from her insurance company who said they do not cover Breo anymore. She rarely has to use albuterol. Continues on Zyrtec daily for allergies, which are well controlled.    Latest Reference Range & Units 06/16/21 10:12  Sheep Sorrel IgE kU/L <0.10  Pecan/Hickory Tree IgE kU/L 0.11 (H)  IgE (Immunoglobulin E), Serum <OR=114 kU/L <OR=114 kU/L 314 (H) 288 (H)  Allergen, D pternoyssinus,d7 kU/L <0.10  Cat Dander kU/L <0.10  Dog Dander kU/L <0.10  [place] Grass kU/L <0.10  Johnson Grass kU/L <0.10  Timothy Grass kU/L <0.10  Cockroach kU/L <0.10  Aspergillus fumigatus, m3 kU/L 0.11 (H)  Allergen, Comm Silver Charletta Cousin, t9 kU/L <0.10  Allergen, Cottonwood, t14 kU/L 0.16 (H)  Elm IgE kU/L 0.16 (H)  Allergen, Mulberry, t76 kU/L <0.10  Allergen, Oak,t7 kU/L 0.10 (H)  COMMON RAGWEED (SHORT) (W1) IGE kU/L <0.10  Allergen, Mouse Urine Protein, e78 kU/L <0.10  D. farinae kU/L <0.10  Allergen, Cedar tree, t12 kU/L <0.10  Box Elder IgE kU/L <0.10  Rough Pigweed  IgE kU/L <0.10  (H): Data is abnormally high   OV 09/05/2021  Subjective:  Patient ID: Anne [name], female , DOB: Jun 12, 1930 , age 89 y.o. , MRN: [id number] , ADDRESS: [address] [place] [address] PCP Merlene Laughter, MD Patient Care Team: Merlene Laughter, MD as PCP - General (Internal Medicine) Lyn Records, MD as PCP - Cardiology (Cardiology) Cindee Salt, MD as Consulting Physician (Orthopedic Surgery) Glendale Chard, DO as Consulting Physician (Neurology)  This Provider for this  visit: Treatment Team:  Attending Provider: Kalman Shan, MD    09/05/2021 -   Chief Complaint  Patient presents with   Follow-up    Pt states she has been doing okay since last visit.    HPI Anne [name] 89 y.o. -presents for follow-up.  After last visit because of worsening dyspnea on exertion we ordered RAST allergy panel.  This shows slightly high IgE level and some positivity to environmental allergies.  Presents for follow-up.  She then saw nurse practitioner.  Who started on Trelegy but she called back saying this did not help.  At this point in time she states that she is back to baseline but she still wanting to know if I recommended Trelegy.  I did advise that there is no point in trying Trelegy again.  Explained to her what Trelegy is compared to [facility].  Explained that because she did not feel better she is unlikely to feel better again with a rechallenge.  Explained the multifactorial nature of her dyspnea.  Did explain to her positive allergies.  We did discuss that be options such as Xolair if she were to get worse and having recurrent exacerbations or even Singulair.  OV 05/01/2022  Subjective:  Patient ID: Anne [name], female , DOB: 06/11/1930 , age 53 y.o. , MRN: [id number] , ADDRESS: [address] [place] [address] PCP Thana Ates, MD Patient Care Team: Thana Ates, MD as PCP - General (Internal Medicine) Lyn Records,  She is social distancing, masking and has had a vaccines.  She continues to be active.  ACT score is 25 and shows good asthma control.    Asthma Control Test ACT Total Score  05/06/2020 25       03/09/2021 Follow up : Asthma  Patient complains of 3 days of increased cough and congestion. Mucus is thick and hard to get up but remains clear. Remains active. Drives and lives alone at home . Has some mild sinus drainage  Remains on BREO daily . No increased Albuterol inhaler use. No wheezing .  No fever,edema , abd pain , n/v/d. Does have some epigastric burning from time to time. No dysphagia.   Recent cardiac workup last month . Echo 02/20/21 showed EF 60%, Grade 1 DD ,Normal PAP  Chest xray nad Coronary CT with no lung issues , felt CAD stable -no significant stenosis per cards note.  Placed on PPI from PCP but has not started. Cardiac monitor to be placed.  TEST/EVENTS :  Exhaled nitric oxide test May 2018: 17 ppb High resolution CT chest August 2016 showed no evidence of interstitial lung disease, chronic collapse of right middle lobe MEdiastinaoscopy 07/27/2009 - Benign node of 4R and 4L. ANthracotoic pigment +. No granuoma. Non malignant PFT 10/28/2014 FEV1  94, ratio 54, FVC 135, no significant bronchodilator response, mid flows reversibility, DLCO 65%  OV 06/16/2021  Subjective:  Patient ID: Anne [name], female , DOB: 23-Jan-1931 , age 71 y.o. , MRN: [id number] , ADDRESS: [address] [place] [address] PCP Merlene Laughter, MD Patient Care Team: Merlene Laughter, MD as PCP - General (Internal Medicine) Lyn Records, MD as PCP - Cardiology (Cardiology) Cindee Salt, MD as Consulting Physician (Orthopedic Surgery) Glendale Chard, DO as Consulting Physician (Neurology)  This Provider for this visit: Treatment Team:  Attending Provider: Noemi Chapel, NP    06/16/2021 -   Chief Complaint  Patient presents with   Follow-up    Pt states she is still having complaints of SOB. Has an occasional cough mainly at night but not of any concern. Denies any complaints of wheezing.   Female never smoker followed for mild persistent asthma  History of diastolic dysfunction  Known chronic right middle lobe collapse/ lymphadenopathy -s/p 07/2007 FOB +psuedomonas - MEdiastinaoscopy 07/27/2009 - Benign node of 4R and 4L. ANthracotoic pigment +. No granuoma. Non malignant  - last cxr 2019 and clear  HPI Anne [name] 89 y.o. -presents for an acute visit.  I personally saw her in February 2022 for her mild asthma and she was stable.  After that she saw a nurse practitioner Rikki Spearing in December 2022 for congestion over 3 days and was given 3 days prednisone.  Meanwhile she is continued her Breo.  However she tells me that ever since Thanksgiving 2022 she has been dealing with congestion and chest that she describes it as a burning and a pain.  Its not present daily.  It is present intermittently on and off.  Definitely she feels different.  Yet at the same time she states she has no wheezing or cough.  She does have some paroxysmal nocturnal dyspnea.  The most recent one was last night but she is unable to quantify how severe this is.   She also admits to acid reflux but then she says this is on and off.  She is not able to tell me if this is worse than before.  She also reports shortness of  MD as PCP - Cardiology (Cardiology) Cindee Salt, MD as Consulting Physician (Orthopedic Surgery) Glendale Chard, DO as Consulting Physician (Neurology)  This Provider for this visit: Treatment Team:  Attending Provider: Kalman Shan, MD    05/01/2022 -   Chief Complaint  Patient presents with   Follow-up    Breathing has improved since her last visit. She prefers [employer] over [employer]. ACT= 19.      HPI Anne [name] 7 y.o. -returns for follow-up.  In December 2023 she had shellfish reaction with wheezing  was given prednisone.  March 21, 2022 she saw Dr. Illene Bolus.  Chest x-ray I reviewed from that day was clear.  Currently she is feeling well.  She is back on her Breo.  She has no issues.  She is up-to-date with the vaccines except RSV.  Educated about RSV vaccine.  She is not taking Mucinex and overall feeling stable.   OV 11/23/2022  Subjective:  Patient ID: Anne [name], female , DOB: Aug 21, 1930 , age 78 y.o. , MRN: [id number] , ADDRESS: [address] [place] [address] PCP Genelle Gather, DO Patient Care Team: Genelle Gather, DO as PCP - General (Family Medicine) Jake Bathe, MD as PCP - Cardiology (Cardiology) Cindee Salt, MD as Consulting Physician (Orthopedic Surgery) Glendale Chard, DO as Consulting Physician (Neurology)  This Provider for this visit: Treatment Team:  Attending Provider: Kalman Shan, MD    11/23/2022 -   Chief Complaint  Patient presents with   Follow-up    F/up, questions about inhalers.    Female never smoker followed for mild persistent asthma  - RAST allergy test positive March 2023  History of diastolic dysfunction  Known chronic right middle lobe collapse/ lymphadenopathy -s/p 07/2007 FOB +psuedomonas - MEdiastinaoscopy 07/27/2009 - Benign node of 4R and 4L. ANthracotoic pigment +. No granuoma. Non malignant  -present on CT Feb 2023   Multifactorial dyspnea on exertion  Shellfish allergy with respiratory wheezing December 2023   HPI Anne [name] 89 y.o. -last seen in January 2024.  Then she called in April 2024 wanting to switch to Trelegy.  We switched her to Trelegy low-dose.  She comes back now saying Inter[facility] status: No new complaints No new medical problems. No new surgeries. No ER visits. No Urgent care visits. No changes to medications.  She is asking to go back on Breo.  She says she did not find the Trelegy particularly helpful.  She also feels it was a little bit too excessive.  She is also  nervous at the same time of going back to [facility] saying that she worries of asthma we will go out of control.  I did tell her that so many options available and scaling up and down is definitely possible based on her symptoms.  Based on this she is willing to go back on Breo.  We discussed high dose versus low-dose.  Previously she was on 100 send which is a low-dose.  Will go back to that.   PFT     Latest Ref Rng & Units 07/14/2021   10:54 AM 10/28/2014   10:13 AM  ILD indicators  FVC-Pre L 2.69  2.83   FVC-Predicted Pre % 130  123   FVC-Post L 2.70  3.13   FVC-Predicted Post % 131  135   TLC L 6.36  6.74   TLC Predicted % 115  122   DLCO uncorrected ml/min/mmHg 17.31  18.63   DLCO

## 2022-11-28 ENCOUNTER — Telehealth: Payer: Self-pay | Admitting: Internal Medicine

## 2022-11-28 ENCOUNTER — Other Ambulatory Visit: Payer: Self-pay

## 2022-11-28 MED ORDER — FLUTICASONE FUROATE-VILANTEROL 100-25 MCG/ACT IN AEPB: 1.0000 | INHALATION_SPRAY | Freq: Every day | RESPIRATORY_TRACT | 5 refills | Status: DC

## 2022-11-28 NOTE — Telephone Encounter (Signed)
Spoke with patient. Advised Breo inhaler has been sent to pharmacy with several refills. Nfn ATT

## 2022-12-04 ENCOUNTER — Telehealth: Payer: Self-pay | Admitting: Internal Medicine

## 2022-12-04 ENCOUNTER — Other Ambulatory Visit: Payer: Self-pay | Admitting: Internal Medicine

## 2022-12-04 NOTE — Telephone Encounter (Signed)
Patient is calling for a refill of albuterol. A new prescription needs to be sent to the pharmacy.  Pharmacy [facility] [address]

## 2022-12-05 ENCOUNTER — Other Ambulatory Visit (HOSPITAL_COMMUNITY): Payer: Self-pay | Admitting: Internal Medicine

## 2022-12-06 MED ORDER — ALBUTEROL SULFATE HFA 108 (90 BASE) MCG/ACT IN AERS: INHALATION_SPRAY | RESPIRATORY_TRACT | 3 refills | Status: DC

## 2022-12-06 NOTE — Telephone Encounter (Signed)
Albuterol refill sent to pharmacy.

## 2022-12-07 ENCOUNTER — Other Ambulatory Visit: Payer: Self-pay | Admitting: Internal Medicine

## 2022-12-07 DIAGNOSIS — Z1231 Encounter for screening mammogram for malignant neoplasm of breast: Secondary | ICD-10-CM

## 2022-12-12 ENCOUNTER — Telehealth: Payer: Self-pay | Admitting: Neurology

## 2022-12-12 NOTE — Telephone Encounter (Signed)
Received a voicemail from patient that her neuropathy has been worsening. Patient stated that she is having pain in her feet and balance issues. She would like to know if she can increase her Gabapentin to a higher dosage? She stated she is taking Gabapentin 300 mg once a day.

## 2022-12-12 NOTE — Telephone Encounter (Signed)
Patient is wanting to know if she could increase the Gabapentin 300mg  . Loss of balance and feeling in both feet but mostly right foot and into right big toe

## 2022-12-13 NOTE — Telephone Encounter (Signed)
Left a detailed message on patients answering machine per DPR that per Dr. Allena Katz  "OK to increase gabapentin to 300mg  twice daily. She has follow-up scheduled for next month."  Left contact information incase patient had any further questions or concerns.

## 2022-12-13 NOTE — Telephone Encounter (Signed)
OK to increase gabapentin to 300mg  twice daily. She has follow-up scheduled for next month.

## 2022-12-19 ENCOUNTER — Ambulatory Visit (HOSPITAL_COMMUNITY)
Admission: RE | Admit: 2022-12-19 | Discharge: 2022-12-19 | Disposition: A | Discharge status: Home / Self Care | Payer: Medicare PPO | Source: Ambulatory Visit | Attending: Internal Medicine | Admitting: Internal Medicine

## 2022-12-19 VITALS — BP 122/56 | HR 55 | Ht 67.0 in | Wt 149.4 lb

## 2022-12-19 DIAGNOSIS — I2510 Atherosclerotic heart disease of native coronary artery without angina pectoris: Secondary | ICD-10-CM | POA: Insufficient documentation

## 2022-12-19 DIAGNOSIS — I10 Essential (primary) hypertension: Secondary | ICD-10-CM | POA: Diagnosis not present

## 2022-12-19 DIAGNOSIS — I480 Paroxysmal atrial fibrillation: Secondary | ICD-10-CM | POA: Insufficient documentation

## 2022-12-19 DIAGNOSIS — D6869 Other thrombophilia: Secondary | ICD-10-CM | POA: Insufficient documentation

## 2022-12-19 DIAGNOSIS — Z7901 Long term (current) use of anticoagulants: Secondary | ICD-10-CM | POA: Insufficient documentation

## 2022-12-19 NOTE — Patient Instructions (Signed)
Kardia mobile or apple watch

## 2022-12-19 NOTE — Progress Notes (Addendum)
Primary Care Physician: Genelle Gather, DO Primary Cardiologist: Dr. Katrinka Blazing Primary Electrophysiologist: None Referring Physician: [facility]    Anne [name] is a 89 y.o. female with a history of HTN, HLD, GERD, anxiety, nonobstructive CAD, asthma, and atrial fibrillation who presents for consultation in the [facility]. The patient was initially diagnosed with atrial fibrillation on 07/22/22 after presenting to [facility] with symptoms of shortness of breath and cough. She was in Afib without RVR and discharged on Eliquis 2.5 mg BID. Patient is on Eliquis 2.5 mg BID for a CHADS2VASC score of 4.  On evaluation 07/31/22, she is currently in SR. She was sick and on prednisone which led to ED evaluation when she was found to be Afib that was rate controlled. She converted to SR spontaneously but does not know when this happened. She appears to not have any cardiac awareness of her Afib. She maybe feels chest pain at time but this is noted historically in review of records as well.   She is compliant with anticoagulation and has not missed any doses. She has no bleeding concerns.  On follow up 08/30/22, she is currently in NSR. Cardiac monitor worn for 2 weeks did not show any Afib burden. Since last office visit, she has felt well overall. She has been compliant with Toprol 25 mg daily and Eliquis 5 mg BID. No bleeding concerns.  On follow up 12/19/22, she is currently in NSR. No episodes of Afib since last office visit that she knows of but unsure if has cardiac awareness. She did not obtain rhythm monitoring device. She has felt well overall. No bleeding issues on Eliquis.   Today, she denies symptoms of palpitations, shortness of breath, orthopnea, PND, lower extremity edema, dizziness, presyncope, syncope, snoring, daytime somnolence, bleeding, or neurologic sequela. The patient is tolerating medications without difficulties and is otherwise without  complaint today.   Atrial Fibrillation Risk Factors:  she does not have symptoms or diagnosis of sleep apnea. she does not have a history of rheumatic fever. she does not have a history of alcohol use. The patient does not have a history of early familial atrial fibrillation or other arrhythmias.  she has a BMI of Body mass index is 23.4 kg/m.Marland Kitchen Filed Weights   12/19/22 1057  Weight: 67.8 kg     Atrial Fibrillation Management history:  Previous antiarrhythmic drugs: None Previous cardioversions: None Previous ablations: None Anticoagulation history: Eliquis 5 mg BID   Past Medical History:  Diagnosis Date   Allergic rhinitis    Arthritis    Asthma    Atypical chest pain    Collapse of right lung    Dyslipidemia    Gastroesophageal reflux disease    Glaucoma    Hematuria    Hyperlipidemia    Lung nodules    PVC's (premature ventricular contractions) 02/21/2021   Sinus bradycardia 02/21/2021   Past Surgical History:  Procedure Laterality Date   ANAL FISSURE REPAIR     BREAST BIOPSY Right    CATARACT EXTRACTION Bilateral    MASS EXCISION  04/11/2012   Procedure: EXCISION MASS;  Surgeon: Nicki Reaper, MD;  Location: [facility];  Service: Orthopedics;  Laterality: Right;  Excision of mass right upper arm   MEDIASTINOSCOPY  4/11   NASAL SINUS SURGERY     TOTAL ABDOMINAL HYSTERECTOMY W/ BILATERAL SALPINGOOPHORECTOMY      Current Outpatient Medications  Medication Sig Dispense Refill   albuterol (  VENTOLIN HFA) 108 (90 Base) MCG/ACT inhaler INHALE 1-2 PUFFS BY MOUTH EVERY 6 HOURS AS NEEDED FOR WHEEZE OR SHORTNESS OF BREATH 18 each 3   amLODipine (NORVASC) 5 MG tablet Take 1 tablet by mouth daily.     Calcium Carb-Cholecalciferol 600-200 MG-UNIT TABS Take 1 tablet by mouth daily.     cetirizine (ZYRTEC) 10 MG tablet Take 10 mg by mouth every morning.     dorzolamide-timolol (COSOPT) 2-0.5 % ophthalmic solution Place 1 drop into the right eye 2 (two) times  daily.     ELIQUIS 5 MG TABS tablet TAKE 1 TABLET BY MOUTH TWICE A DAY 60 tablet 3   ferrous sulfate 325 (65 FE) MG tablet Take 325 mg by mouth once a week.   Take once a week reported on: 01/01/2022     fluticasone (FLONASE) 50 MCG/ACT nasal spray Place 1 spray into both nostrils 2 (two) times daily as needed for allergies.      fluticasone furoate-vilanterol (BREO ELLIPTA) 100-25 MCG/ACT AEPB Inhale 1 puff into the lungs daily. 60 each 5   gabapentin (NEURONTIN) 300 MG capsule Take 1 capsule (300 mg total) by mouth at bedtime. 90 capsule 3   latanoprost (XALATAN) 0.005 % ophthalmic solution Place 1 drop into both eyes at bedtime.     metoprolol succinate (TOPROL-XL) 25 MG 24 hr tablet TAKE 1 TABLET (25 MG TOTAL) BY MOUTH DAILY. 90 tablet 3   Multiple Vitamin (MULTIVITAMIN) tablet Take 1 tablet by mouth every morning.      nitroGLYCERIN (NITROSTAT) 0.4 MG SL tablet Place 1 tablet (0.4 mg total) under the tongue every 5 (five) minutes as needed for chest pain (Up to 3 doses in 15 minutes.). 25 tablet 3   rosuvastatin (CRESTOR) 20 MG tablet TAKE 1 TABLET BY MOUTH EVERY DAY 90 tablet 2   GEMTESA 75 MG TABS Take 1 tablet by mouth daily. (Patient not taking: Reported on 12/19/2022)     No current facility-administered medications for this encounter.   Facility-Administered Medications Ordered in Other Encounters  Medication Dose Route Frequency Provider Last Rate Last Admin   regadenoson (LEXISCAN) injection SOLN 0.4 mg  0.4 mg Intravenous Once Quintella Reichert, MD       regadenoson (LEXISCAN) injection SOLN 0.4 mg  0.4 mg Intravenous Once Little Ishikawa, MD       technetium tetrofosmin (TC-MYOVIEW) injection 32.3 millicurie  32.3 millicurie Intravenous Once PRN Little Ishikawa, MD        Allergies  Allergen Reactions   Other Shortness Of Breath    Pt states she has asthma and seafood causes breathing problems.    Shellfish Allergy Shortness Of Breath    Pt states she has  asthma and seafood causes breathing problems.   Iodine Other (See Comments)    REACTION: " UNSURE TOLD TO LIST AS ALLERGEN BY MD" REACTION: " UNSURE TOLD TO LIST AS ALLERGEN BY MD"   Latex Itching and Rash   Methocarbamol Other (See Comments)   Pravastatin Other (See Comments)   Adhesive [Tape] Rash   ROS- All systems are reviewed and negative except as per the HPI above.  Physical Exam: Vitals:   12/19/22 1057  BP: (!) 122/56  Pulse: (!) 55  Weight: 67.8 kg  Height: 5\' 7"  (1.702 m)     GEN- The patient is well appearing, alert and oriented x 3 today.   Neck - no JVD or carotid bruit noted Lungs- Clear to ausculation bilaterally, normal work of breathing  Heart- Regular rate and rhythm, no murmurs, rubs or gallops, PMI not laterally displaced Extremities- no clubbing, cyanosis, or edema Skin - no rash or ecchymosis noted   Wt Readings from Last 3 Encounters:  12/19/22 67.8 kg  11/23/22 67.9 kg  09/04/22 68 kg    EKG today demonstrates  Vent. rate 55 BPM PR interval 214 ms QRS duration 72 ms QT/QTcB 374/357 ms P-R-T axes -18 -7 -13 Sinus bradycardia with 1st degree A-V block Septal infarct , age undetermined Inferior infarct , age undetermined Abnormal ECG When compared with ECG of 30-Aug-2022 11:39, PREVIOUS ECG IS PRESENT  Echo 03/21/22 demonstrated: 1. Left ventricular ejection fraction, by estimation, is 60 to 65%. The  left ventricle has normal function. The left ventricle has no regional  wall motion abnormalities. Left ventricular diastolic parameters were  normal. The average left ventricular  global longitudinal strain is -19.4 %. The global longitudinal strain is  normal.   2. Right ventricular systolic function is normal. The right ventricular  size is normal. There is mildly elevated pulmonary artery systolic  pressure. The estimated right ventricular systolic pressure is 37.6 mmHg.   3. The mitral valve is normal in structure. Mild mitral valve   regurgitation. No evidence of mitral stenosis.   4. Tricuspid valve regurgitation is moderate.   5. The aortic valve is tricuspid. There is mild calcification of the  aortic valve. There is mild thickening of the aortic valve. Aortic valve  regurgitation is not visualized. Aortic valve sclerosis is present, with  no evidence of aortic valve stenosis.   6. The inferior vena cava is normal in size with greater than 50%  respiratory variability, suggesting right atrial pressure of 3 mmHg.  Epic records are reviewed at length today.  Cardiac monitor 4/30-5/14/24: Patch Wear Time:  13 days and 23 hours    Predominant rhythm was sinus rhythm 13.1% supraventricular ectopy Less than 1% ventricular ectopy No atrial fibrillation noted Patient triggered episodes associated with sinus rhythm with PACs  CHA2DS2-VASc Score = 4  The patient's score is based upon: CHF History: 0 HTN History: 1 Diabetes History: 0 Stroke History: 0 Vascular Disease History: 0 Age Score: 2 Gender Score: 1     ASSESSMENT AND PLAN: Paroxysmal Atrial Fibrillation (ICD10:  I48.0) The patient's CHA2DS2-VASc score is 4, indicating a 4.8% annual risk of stroke.    She is in SR today.   Recommended Kardiamobile device or watch for rhythm monitoring device. Monitor in April/May showed no Afib. I suspect she may not have cardiac awareness; rate controlled Afib when she was in ED.   We could consider amiodarone as AAD options, caution given asthma history. Less likely Tikosyn noted by Dr. Anne Fu. If no cardiac awareness, can also consider rate control.   Continue Toprol 25 mg daily.   2. Secondary Hypercoagulable State (ICD10:  D68.69) The patient is at significant risk for stroke/thromboembolism based upon her CHA2DS2-VASc Score of 4.  Continue Apixaban (Eliquis).   No missed doses. Continue Eliquis 5 mg BID  3. HTN Stable today, no changes.   Follow up 6 months Afib clinic.    Lake Bells, PA-C Afib  Clinic [facility] [address] [place], [place] [address] [phone] 12/19/2022 11:40 AM

## 2022-12-31 DIAGNOSIS — H60501 Unspecified acute noninfective otitis externa, right ear: Secondary | ICD-10-CM | POA: Diagnosis not present

## 2023-01-02 ENCOUNTER — Ambulatory Visit
Admission: RE | Admit: 2023-01-02 | Discharge: 2023-01-02 | Disposition: A | Discharge status: Home / Self Care | Payer: Medicare PPO | Source: Ambulatory Visit | Attending: Internal Medicine | Admitting: Internal Medicine

## 2023-01-02 ENCOUNTER — Ambulatory Visit: Payer: Medicare PPO

## 2023-01-02 DIAGNOSIS — Z1231 Encounter for screening mammogram for malignant neoplasm of breast: Secondary | ICD-10-CM

## 2023-01-03 ENCOUNTER — Encounter (HOSPITAL_COMMUNITY): Payer: Self-pay | Admitting: Emergency Medicine

## 2023-01-03 ENCOUNTER — Emergency Department (HOSPITAL_COMMUNITY): Payer: Medicare PPO

## 2023-01-03 ENCOUNTER — Inpatient Hospital Stay (HOSPITAL_COMMUNITY)
Admission: EM | Admit: 2023-01-03 | Discharge: 2023-01-07 | DRG: 178 | Disposition: A | Discharge status: Home Health | Payer: Medicare PPO | Attending: Internal Medicine | Admitting: Internal Medicine

## 2023-01-03 ENCOUNTER — Other Ambulatory Visit: Payer: Self-pay

## 2023-01-03 DIAGNOSIS — Z803 Family history of malignant neoplasm of breast: Secondary | ICD-10-CM

## 2023-01-03 DIAGNOSIS — J45909 Unspecified asthma, uncomplicated: Secondary | ICD-10-CM | POA: Diagnosis present

## 2023-01-03 DIAGNOSIS — N3001 Acute cystitis with hematuria: Secondary | ICD-10-CM | POA: Diagnosis not present

## 2023-01-03 DIAGNOSIS — K219 Gastro-esophageal reflux disease without esophagitis: Secondary | ICD-10-CM | POA: Diagnosis present

## 2023-01-03 DIAGNOSIS — F17210 Nicotine dependence, cigarettes, uncomplicated: Secondary | ICD-10-CM | POA: Diagnosis present

## 2023-01-03 DIAGNOSIS — E872 Acidosis, unspecified: Secondary | ICD-10-CM

## 2023-01-03 DIAGNOSIS — R42 Dizziness and giddiness: Secondary | ICD-10-CM

## 2023-01-03 DIAGNOSIS — K551 Chronic vascular disorders of intestine: Secondary | ICD-10-CM | POA: Diagnosis present

## 2023-01-03 DIAGNOSIS — U071 COVID-19: Principal | ICD-10-CM | POA: Diagnosis present

## 2023-01-03 DIAGNOSIS — Z7901 Long term (current) use of anticoagulants: Secondary | ICD-10-CM

## 2023-01-03 DIAGNOSIS — I10 Essential (primary) hypertension: Secondary | ICD-10-CM | POA: Diagnosis present

## 2023-01-03 DIAGNOSIS — Z8249 Family history of ischemic heart disease and other diseases of the circulatory system: Secondary | ICD-10-CM

## 2023-01-03 DIAGNOSIS — I480 Paroxysmal atrial fibrillation: Secondary | ICD-10-CM | POA: Diagnosis present

## 2023-01-03 DIAGNOSIS — K449 Diaphragmatic hernia without obstruction or gangrene: Secondary | ICD-10-CM | POA: Diagnosis not present

## 2023-01-03 DIAGNOSIS — I2510 Atherosclerotic heart disease of native coronary artery without angina pectoris: Secondary | ICD-10-CM | POA: Diagnosis present

## 2023-01-03 DIAGNOSIS — I959 Hypotension, unspecified: Secondary | ICD-10-CM | POA: Diagnosis present

## 2023-01-03 DIAGNOSIS — Z91013 Allergy to seafood: Secondary | ICD-10-CM

## 2023-01-03 DIAGNOSIS — Z79899 Other long term (current) drug therapy: Secondary | ICD-10-CM

## 2023-01-03 DIAGNOSIS — E876 Hypokalemia: Secondary | ICD-10-CM | POA: Diagnosis present

## 2023-01-03 DIAGNOSIS — Z888 Allergy status to other drugs, medicaments and biological substances status: Secondary | ICD-10-CM

## 2023-01-03 DIAGNOSIS — R509 Fever, unspecified: Secondary | ICD-10-CM | POA: Diagnosis not present

## 2023-01-03 DIAGNOSIS — Z809 Family history of malignant neoplasm, unspecified: Secondary | ICD-10-CM

## 2023-01-03 DIAGNOSIS — I700 Atherosclerosis of aorta: Secondary | ICD-10-CM | POA: Diagnosis not present

## 2023-01-03 DIAGNOSIS — R319 Hematuria, unspecified: Secondary | ICD-10-CM

## 2023-01-03 DIAGNOSIS — Z515 Encounter for palliative care: Secondary | ICD-10-CM | POA: Diagnosis not present

## 2023-01-03 DIAGNOSIS — Z91048 Other nonmedicinal substance allergy status: Secondary | ICD-10-CM | POA: Diagnosis not present

## 2023-01-03 DIAGNOSIS — W19XXXA Unspecified fall, initial encounter: Secondary | ICD-10-CM | POA: Diagnosis not present

## 2023-01-03 DIAGNOSIS — Z91040 Latex allergy status: Secondary | ICD-10-CM

## 2023-01-03 DIAGNOSIS — R531 Weakness: Secondary | ICD-10-CM | POA: Diagnosis present

## 2023-01-03 DIAGNOSIS — Z7189 Other specified counseling: Secondary | ICD-10-CM | POA: Diagnosis not present

## 2023-01-03 DIAGNOSIS — H409 Unspecified glaucoma: Secondary | ICD-10-CM | POA: Diagnosis present

## 2023-01-03 DIAGNOSIS — R109 Unspecified abdominal pain: Secondary | ICD-10-CM | POA: Diagnosis not present

## 2023-01-03 DIAGNOSIS — R6510 Systemic inflammatory response syndrome (SIRS) of non-infectious origin without acute organ dysfunction: Secondary | ICD-10-CM

## 2023-01-03 DIAGNOSIS — R55 Syncope and collapse: Secondary | ICD-10-CM

## 2023-01-03 DIAGNOSIS — E785 Hyperlipidemia, unspecified: Secondary | ICD-10-CM | POA: Diagnosis present

## 2023-01-03 DIAGNOSIS — N281 Cyst of kidney, acquired: Secondary | ICD-10-CM | POA: Diagnosis not present

## 2023-01-03 DIAGNOSIS — S0990XA Unspecified injury of head, initial encounter: Secondary | ICD-10-CM | POA: Diagnosis not present

## 2023-01-03 LAB — URINALYSIS, W/ REFLEX TO CULTURE (INFECTION SUSPECTED)
Bacteria, UA: NONE SEEN
Bilirubin Urine: NEGATIVE
Glucose, UA: NEGATIVE mg/dL
Ketones, ur: 5 mg/dL — AB
Leukocytes,Ua: NEGATIVE
Nitrite: NEGATIVE
Protein, ur: NEGATIVE mg/dL
RBC / HPF: >50 RBC/hpf (ref 0–5)
Specific Gravity, Urine: 1.011 (ref 1.005–1.030)
pH: 7 (ref 5.0–8.0)

## 2023-01-03 LAB — CBC WITH DIFFERENTIAL/PLATELET
Abs Immature Granulocytes: 0.02 10*3/uL (ref 0.00–0.07)
Basophils Absolute: 0 10*3/uL (ref 0.0–0.1)
Basophils Relative: 0 %
Eosinophils Absolute: 0 10*3/uL (ref 0.0–0.5)
Eosinophils Relative: 0 %
HCT: 33.6 % — ABNORMAL LOW (ref 36.0–46.0)
Hemoglobin: 10.9 g/dL — ABNORMAL LOW (ref 12.0–15.0)
Immature Granulocytes: 0 %
Lymphocytes Relative: 16 %
Lymphs Abs: 1 10*3/uL (ref 0.7–4.0)
MCH: 29.5 pg (ref 26.0–34.0)
MCHC: 32.4 g/dL (ref 30.0–36.0)
MCV: 91.1 fL (ref 80.0–100.0)
Monocytes Absolute: 1.3 10*3/uL — ABNORMAL HIGH (ref 0.1–1.0)
Monocytes Relative: 21 %
Neutro Abs: 3.7 10*3/uL (ref 1.7–7.7)
Neutrophils Relative %: 63 %
Platelets: 237 10*3/uL (ref 150–400)
RBC: 3.69 MIL/uL — ABNORMAL LOW (ref 3.87–5.11)
RDW: 13.5 % (ref 11.5–15.5)
WBC: 6 10*3/uL (ref 4.0–10.5)
nRBC: 0 % (ref 0.0–0.2)

## 2023-01-03 LAB — I-STAT CG4 LACTIC ACID, ED
Lactic Acid, Venous: 1.2 mmol/L (ref 0.5–1.9)
Lactic Acid, Venous: 4.4 mmol/L (ref 0.5–1.9)
Lactic Acid, Venous: 0.6 mmol/L (ref 0.5–1.9)

## 2023-01-03 LAB — COMPREHENSIVE METABOLIC PANEL
ALT: 26 U/L (ref 0–44)
AST: 43 U/L — ABNORMAL HIGH (ref 15–41)
Albumin: 3.7 g/dL (ref 3.5–5.0)
Alkaline Phosphatase: 51 U/L (ref 38–126)
Anion gap: 9 (ref 5–15)
BUN: 13 mg/dL (ref 8–23)
CO2: 23 mmol/L (ref 22–32)
Calcium: 8.9 mg/dL (ref 8.9–10.3)
Chloride: 103 mmol/L (ref 98–111)
Creatinine, Ser: 0.97 mg/dL (ref 0.44–1.00)
GFR, Estimated: 56 mL/min — ABNORMAL LOW (ref >60)
Glucose, Bld: 96 mg/dL (ref 70–99)
Potassium: 3.5 mmol/L (ref 3.5–5.1)
Sodium: 135 mmol/L (ref 135–145)
Total Bilirubin: 0.5 mg/dL (ref 0.3–1.2)
Total Protein: 6.5 g/dL (ref 6.5–8.1)

## 2023-01-03 LAB — TROPONIN I (HIGH SENSITIVITY)
Troponin I (High Sensitivity): 17 ng/L (ref <18)
Troponin I (High Sensitivity): 16 ng/L (ref <18)

## 2023-01-03 LAB — APTT: aPTT: 33 s (ref 24–36)

## 2023-01-03 LAB — PROTIME-INR
INR: 1.2 (ref 0.8–1.2)
Prothrombin Time: 15.7 s — ABNORMAL HIGH (ref 11.4–15.2)

## 2023-01-03 MED ORDER — ONDANSETRON HCL 4 MG/2ML IJ SOLN: 4.0000 mg | Freq: Four times a day (QID) | INTRAMUSCULAR | Status: DC | PRN

## 2023-01-03 MED ORDER — GABAPENTIN 300 MG PO CAPS
300.0000 mg | ORAL_CAPSULE | Freq: Every day | ORAL | Status: DC
  Administered 2023-01-03 – 2023-01-06 (×4): 300 mg via ORAL
  Filled 2023-01-03 (×4): qty 1

## 2023-01-03 MED ORDER — ONDANSETRON HCL 4 MG PO TABS: 4.0000 mg | ORAL_TABLET | Freq: Four times a day (QID) | ORAL | Status: DC | PRN

## 2023-01-03 MED ORDER — ROSUVASTATIN CALCIUM 20 MG PO TABS
20.0000 mg | ORAL_TABLET | Freq: Every day | ORAL | Status: DC
  Administered 2023-01-03 – 2023-01-07 (×5): 20 mg via ORAL
  Filled 2023-01-03 (×5): qty 1

## 2023-01-03 MED ORDER — ACETAMINOPHEN 500 MG PO TABS
1000.0000 mg | ORAL_TABLET | Freq: Once | ORAL | Status: AC
  Administered 2023-01-03: 1000 mg via ORAL
  Filled 2023-01-03: qty 2

## 2023-01-03 MED ORDER — FLUTICASONE FUROATE-VILANTEROL 100-25 MCG/ACT IN AEPB
1.0000 | INHALATION_SPRAY | Freq: Every day | RESPIRATORY_TRACT | Status: DC
  Administered 2023-01-03 – 2023-01-07 (×5): 1 via RESPIRATORY_TRACT
  Filled 2023-01-03: qty 28

## 2023-01-03 MED ORDER — APIXABAN 5 MG PO TABS
5.0000 mg | ORAL_TABLET | Freq: Two times a day (BID) | ORAL | Status: DC
  Administered 2023-01-03 – 2023-01-07 (×8): 5 mg via ORAL
  Filled 2023-01-03 (×8): qty 1

## 2023-01-03 MED ORDER — ACETAMINOPHEN 325 MG PO TABS
650.0000 mg | ORAL_TABLET | Freq: Four times a day (QID) | ORAL | Status: DC | PRN
  Administered 2023-01-04 (×3): 650 mg via ORAL
  Filled 2023-01-03 (×3): qty 2

## 2023-01-03 MED ORDER — SENNOSIDES-DOCUSATE SODIUM 8.6-50 MG PO TABS: 1.0000 | ORAL_TABLET | Freq: Every evening | ORAL | Status: DC | PRN

## 2023-01-03 MED ORDER — SODIUM CHLORIDE 0.9% FLUSH
3.0000 mL | Freq: Two times a day (BID) | INTRAVENOUS | Status: DC
  Administered 2023-01-03 – 2023-01-07 (×6): 3 mL via INTRAVENOUS

## 2023-01-03 MED ORDER — ACETAMINOPHEN 650 MG RE SUPP: 650.0000 mg | Freq: Four times a day (QID) | RECTAL | Status: DC | PRN

## 2023-01-03 MED ORDER — LACTATED RINGERS IV BOLUS
1000.0000 mL | Freq: Once | INTRAVENOUS | Status: AC
  Administered 2023-01-03: 1000 mL via INTRAVENOUS

## 2023-01-03 MED ORDER — OFLOXACIN 0.3 % OP SOLN
5.0000 [drp] | Freq: Every day | OPHTHALMIC | Status: DC
  Administered 2023-01-03 – 2023-01-07 (×5): 5 [drp] via OTIC
  Filled 2023-01-03: qty 5

## 2023-01-03 MED ORDER — METOPROLOL SUCCINATE ER 25 MG PO TB24
25.0000 mg | ORAL_TABLET | Freq: Every day | ORAL | Status: DC
  Administered 2023-01-04 – 2023-01-07 (×4): 25 mg via ORAL
  Filled 2023-01-03 (×4): qty 1

## 2023-01-03 MED ORDER — ALBUTEROL SULFATE (2.5 MG/3ML) 0.083% IN NEBU: 2.5000 mg | INHALATION_SOLUTION | Freq: Four times a day (QID) | RESPIRATORY_TRACT | Status: DC | PRN

## 2023-01-03 MED ORDER — LACTATED RINGERS IV SOLN: INTRAVENOUS | Status: DC

## 2023-01-03 MED ORDER — SODIUM CHLORIDE 0.9 % IV SOLN
1.0000 g | Freq: Once | INTRAVENOUS | Status: AC
  Administered 2023-01-03: 1 g via INTRAVENOUS
  Filled 2023-01-03: qty 10

## 2023-01-03 MED ORDER — SODIUM CHLORIDE 0.9 % IV SOLN: INTRAVENOUS | Status: AC

## 2023-01-03 NOTE — Hospital Course (Signed)
Anne [name] is a 89 y.o. female with medical history significant for PAF on Eliquis, asthma, HTN, HLD who is admitted after near syncopal episode.

## 2023-01-03 NOTE — ED Notes (Signed)
Patient transported to CT 

## 2023-01-03 NOTE — ED Notes (Signed)
Patient placed onto monitor

## 2023-01-03 NOTE — ED Notes (Signed)
ED TO INPATIENT HANDOFF REPORT  ED Nurse Name and Phone #: [email or web address] [phone]  S Name/Age/Gender Anne [name] 89 y.o. female Room/Bed: 018C/018C  Code Status   Code Status: Full Code  Home/SNF/Other Home Patient oriented to: self, place, time, and situation Is this baseline? Yes   Triage Complete: Triage complete  Chief Complaint Near syncope [R55]  Triage Note Pt arrives via [facility] from home with reports of sliding out of the bed today. Complaints of dizziness when sitting up. Pt has not taken HTN meds today yet. States she felt bad last night.    Allergies Allergies  Allergen Reactions   Other Shortness Of Breath    Pt states she has asthma and seafood causes breathing problems.    Shellfish Allergy Shortness Of Breath    Pt states she has asthma and seafood causes breathing problems.   Iodine Other (See Comments)    REACTION: " UNSURE TOLD TO LIST AS ALLERGEN BY MD" REACTION: " UNSURE TOLD TO LIST AS ALLERGEN BY MD"   Latex Itching and Rash   Methocarbamol Other (See Comments)   Pravastatin Other (See Comments)   Adhesive [Tape] Rash    Level of Care/Admitting Diagnosis ED Disposition     ED Disposition  Admit   Condition  --   Comment  Hospital Area: [facility] [[id number]]  Level of Care: Telemetry Medical [104]  May place patient in observation at [facility] or [facility] if equivalent level of care is available:: No  Covid Evaluation: Asymptomatic - no recent exposure (last 10 days) testing not required  Diagnosis: Near syncope [phone]  Admitting Physician: Charlsie Quest [[id number]]  Attending Physician: Charlsie Quest [[id number]]          B Medical/Surgery History Past Medical History:  Diagnosis Date   Allergic rhinitis    Arthritis    Asthma    Atypical chest pain    Collapse of right lung    Dyslipidemia    Gastroesophageal reflux disease    Glaucoma    Hematuria    Hyperlipidemia    Lung nodules    PVC's (premature  ventricular contractions) 02/21/2021   Sinus bradycardia 02/21/2021   Past Surgical History:  Procedure Laterality Date   ANAL FISSURE REPAIR     BREAST BIOPSY Right    CATARACT EXTRACTION Bilateral    MASS EXCISION  04/11/2012   Procedure: EXCISION MASS;  Surgeon: Nicki Reaper, MD;  Location: [facility];  Service: Orthopedics;  Laterality: Right;  Excision of mass right upper arm   MEDIASTINOSCOPY  4/11   NASAL SINUS SURGERY     TOTAL ABDOMINAL HYSTERECTOMY W/ BILATERAL SALPINGOOPHORECTOMY       A IV Location/Drains/Wounds Patient Lines/Drains/Airways Status     Active Line/Drains/Airways     Name Placement date Placement time Site Days   Peripheral IV 01/03/23 20 G Left Antecubital 01/03/23  1055  Antecubital  less than 1   External Urinary Catheter 01/03/23  1452  --  less than 1            Intake/Output Last 24 hours No intake or output data in the 24 hours ending 01/03/23 1811  Labs/Imaging Results for orders placed or performed during the hospital encounter of 01/03/23 (from the past 48 hour(s))  Comprehensive metabolic panel     Status: Abnormal   Collection Time: 01/03/23 10:55 AM  Result Value Ref Range   Sodium 135 135 - 145 mmol/L   Potassium  3.5 3.5 - 5.1 mmol/L   Chloride 103 98 - 111 mmol/L   CO2 23 22 - 32 mmol/L   Glucose, Bld 96 70 - 99 mg/dL    Comment: Glucose reference range applies only to samples taken after fasting for at least 8 hours.   BUN 13 8 - 23 mg/dL   Creatinine, Ser 1.61 0.44 - 1.00 mg/dL   Calcium 8.9 8.9 - 09.6 mg/dL   Total Protein 6.5 6.5 - 8.1 g/dL   Albumin 3.7 3.5 - 5.0 g/dL   AST 43 (H) 15 - 41 U/L   ALT 26 0 - 44 U/L   Alkaline Phosphatase 51 38 - 126 U/L   Total Bilirubin 0.5 0.3 - 1.2 mg/dL   GFR, Estimated 56 (L) >60 mL/min    Comment: (NOTE) Calculated using the CKD-EPI Creatinine Equation (2021)    Anion gap 9 5 - 15    Comment: Performed at [facility], [address] [place], [place]  [address]  CBC with Differential     Status: Abnormal   Collection Time: 01/03/23 10:55 AM  Result Value Ref Range   WBC 6.0 4.0 - 10.5 K/uL   RBC 3.69 (L) 3.87 - 5.11 MIL/uL   Hemoglobin 10.9 (L) 12.0 - 15.0 g/dL   HCT 98.1 (L) 19.1 - 47.8 %   MCV 91.1 80.0 - 100.0 fL   MCH 29.5 26.0 - 34.0 pg   MCHC 32.4 30.0 - 36.0 g/dL   RDW 29.5 62.1 - 30.8 %   Platelets 237 150 - 400 K/uL   nRBC 0.0 0.0 - 0.2 %   Neutrophils Relative % 63 %   Neutro Abs 3.7 1.7 - 7.7 K/uL   Lymphocytes Relative 16 %   Lymphs Abs 1.0 0.7 - 4.0 K/uL   Monocytes Relative 21 %   Monocytes Absolute 1.3 (H) 0.1 - 1.0 K/uL   Eosinophils Relative 0 %   Eosinophils Absolute 0.0 0.0 - 0.5 K/uL   Basophils Relative 0 %   Basophils Absolute 0.0 0.0 - 0.1 K/uL   Immature Granulocytes 0 %   Abs Immature Granulocytes 0.02 0.00 - 0.07 K/uL    Comment: Performed at [facility], [address] [place], [place] [address]  Protime-INR     Status: Abnormal   Collection Time: 01/03/23 10:55 AM  Result Value Ref Range   Prothrombin Time 15.7 (H) 11.4 - 15.2 seconds   INR 1.2 0.8 - 1.2    Comment: (NOTE) INR goal varies based on device and disease states. Performed at [facility], [address] [place], [place] [address]   APTT     Status: None   Collection Time: 01/03/23 10:55 AM  Result Value Ref Range   aPTT 33 24 - 36 seconds    Comment: Performed at [facility], [address] [place], [place] [address]  Troponin I (High Sensitivity)     Status: None   Collection Time: 01/03/23 10:55 AM  Result Value Ref Range   Troponin I (High Sensitivity) 17 <18 ng/L    Comment: (NOTE) Elevated high sensitivity troponin I (hsTnI) values and significant  changes across serial measurements may suggest ACS but many other  chronic and acute conditions are known to elevate hsTnI results.  Refer to the "Links" section for chest pain algorithms and additional  guidance. Performed at [facility],  [address] [place], [place] [address]   I-Stat Lactic Acid, ED  Status: None   Collection Time: 01/03/23 11:23 AM  Result Value Ref Range   Lactic Acid, Venous 1.2 0.5 - 1.9 mmol/L  Urinalysis, w/ Reflex to Culture (Infection Suspected) -Urine, Clean Catch     Status: Abnormal   Collection Time: 01/03/23 12:03 PM  Result Value Ref Range   Specimen Source URINE, CLEAN CATCH    Color, Urine YELLOW YELLOW   APPearance CLEAR CLEAR   Specific Gravity, Urine 1.011 1.005 - 1.030   pH 7.0 5.0 - 8.0   Glucose, UA NEGATIVE NEGATIVE mg/dL   Hgb urine dipstick MODERATE (A) NEGATIVE   Bilirubin Urine NEGATIVE NEGATIVE   Ketones, ur 5 (A) NEGATIVE mg/dL   Protein, ur NEGATIVE NEGATIVE mg/dL   Nitrite NEGATIVE NEGATIVE   Leukocytes,Ua NEGATIVE NEGATIVE   RBC / HPF >50 0 - 5 RBC/hpf   WBC, UA 0-5 0 - 5 WBC/hpf    Comment:        Reflex urine culture not performed if WBC <=10, OR if Squamous epithelial cells >5. If Squamous epithelial cells >5 suggest recollection.    Bacteria, UA NONE SEEN NONE SEEN   Squamous Epithelial / HPF 0-5 0 - 5 /HPF   Mucus PRESENT     Comment: Performed at [facility], [address] [place], [place] [address]  Troponin I (High Sensitivity)     Status: None   Collection Time: 01/03/23  1:10 PM  Result Value Ref Range   Troponin I (High Sensitivity) 16 <18 ng/L    Comment: (NOTE) Elevated high sensitivity troponin I (hsTnI) values and significant  changes across serial measurements may suggest ACS but many other  chronic and acute conditions are known to elevate hsTnI results.  Refer to the "Links" section for chest pain algorithms and additional  guidance. Performed at [facility], [address], [place], [place] [address]   I-Stat Lactic Acid, ED     Status: Abnormal   Collection Time: 01/03/23  1:13 PM  Result Value Ref Range   Lactic Acid, Venous 4.4 (HH) 0.5 - 1.9 mmol/L   Comment NOTIFIED PHYSICIAN    CT ABDOMEN PELVIS WO  CONTRAST  Result Date: 01/03/2023 CLINICAL DATA:  Acute, non localized abdominal pain. EXAM: CT ABDOMEN AND PELVIS WITHOUT CONTRAST TECHNIQUE: Multidetector CT imaging of the abdomen and pelvis was performed following the standard protocol without IV contrast. RADIATION DOSE REDUCTION: This exam was performed according to the departmental dose-optimization program which includes automated exposure control, adjustment of the mA and/or kV according to patient size and/or use of iterative reconstruction technique. COMPARISON:  07/11/2007.  Chest CTA dated 05/06/2021. FINDINGS: Lower chest: Borderline enlarged heart. Small hiatal hernia. Mild linear scarring/atelectasis at both lung bases. Hepatobiliary: No focal liver abnormality is seen. No gallstones, gallbladder wall thickening, or biliary dilatation. Pancreas: Unremarkable. No pancreatic ductal dilatation or surrounding inflammatory changes. Spleen: Normal in size without focal abnormality. Adrenals/Urinary Tract: Normal appearing adrenal glands with the exception of a small amount of calcification again demonstrated in the right adrenal gland. A simple appearing left renal cyst is again demonstrated with a mean density of -2.8 Hounsfield units. Normal-appearing right kidney, ureters and urinary bladder. Stomach/Bowel: Small hiatal hernia. Prominent stool in the right and transverse colon. Normal-appearing appendix. Unremarkable small bowel. Vascular/Lymphatic: Atheromatous arterial calcifications. 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 No enlarged lymph nodes. Reproductive: Status post hysterectomy. No adnexal masses. Other: No abdominal wall hernia or abnormality. No abdominopelvic ascites. Musculoskeletal: Lumbar and lower thoracic spine degenerative changes.  IMPRESSION: 1. No acute abnormality. 2. Prominent stool in the right and transverse colon. 3. Small hiatal hernia. 4. Borderline cardiomegaly. 5. 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 Electronically Signed   By: Beckie Salts M.D.   On: 01/03/2023 17:42   MM 3D SCREENING MAMMOGRAM BILATERAL BREAST  Result Date: 01/03/2023 CLINICAL DATA:  Screening. EXAM: DIGITAL SCREENING BILATERAL MAMMOGRAM WITH TOMOSYNTHESIS AND CAD TECHNIQUE: Bilateral screening digital craniocaudal and mediolateral oblique mammograms were obtained. Bilateral screening digital breast tomosynthesis was performed. The images were evaluated with computer-aided detection. COMPARISON:  Previous exam(s). ACR Breast Density Category b: There are scattered areas of fibroglandular density. FINDINGS: There are no findings suspicious for malignancy. IMPRESSION: No mammographic evidence of malignancy. A result letter of this screening mammogram will be mailed directly to the patient. RECOMMENDATION: Screening mammogram in one year. (Code:SM-B-01Y) BI-RADS CATEGORY  1: Negative. Electronically Signed   By: Sherron Ales M.D.   On: 01/03/2023 17:07   DG Chest Port 1 View  Result Date: 01/03/2023 CLINICAL DATA:  Dizziness. EXAM: PORTABLE CHEST 1 VIEW COMPARISON:  Chest radiograph dated July 22, 2022. FINDINGS: The heart size and mediastinal contours are within normal limits. Aortic calcification. No focal consolidation, pneumothorax, or sizable pleural effusion. No acute osseous abnormality. IMPRESSION: No active cardiopulmonary disease. Electronically Signed   By: Hart Robinsons M.D.   On: 01/03/2023 13:42   CT Head Wo Contrast  Result Date: 01/03/2023 CLINICAL DATA:  Minor head trauma EXAM: CT HEAD WITHOUT CONTRAST TECHNIQUE: Contiguous axial images were obtained from the base of the skull through the vertex without  intravenous contrast. RADIATION DOSE REDUCTION: This exam was performed according to the departmental dose-optimization program which includes automated exposure control, adjustment of the mA and/or kV according to patient size and/or use of iterative reconstruction technique. COMPARISON:  05/31/2019 FINDINGS: Brain: No evidence of acute infarction, hemorrhage, hydrocephalus, extra-axial collection or mass lesion/mass effect. Generalized brain atrophy that is mild for age. Age normal white matter appearance Vascular: No hyperdense vessel or unexpected calcification. Skull: No acute or aggressive finding Sinuses/Orbits: Chronic sinusitis with generalized mucosal thickening. Prior endoscopic sinus surgery.No acute finding. IMPRESSION: No evidence of intracranial injury. Electronically Signed   By: Tiburcio Pea M.D.   On: 01/03/2023 13:19    Pending Labs Unresulted Labs (From admission, onward)     Start     Ordered   01/04/23 0500  CBC  Tomorrow morning,   R        01/03/23 1811   01/04/23 0500  Basic metabolic panel  Tomorrow morning,   R        01/03/23 1811   01/03/23 2000  Lactic acid, plasma  (Lactic Acid)  Once,   R        01/03/23 1811   01/03/23 1110  Blood Culture (routine x 2)  (Undifferentiated presentation (screening labs and basic nursing orders))  BLOOD CULTURE X 2,   STAT      01/03/23 1110            Vitals/Pain Today's Vitals   01/03/23 1726 01/03/23 1730 01/03/23 1730 01/03/23 1803  BP: (!) 147/64   (!) 157/51  Pulse: 72  78 80  Resp: 16  18 20   Temp:      TempSrc:      SpO2: 97%  95% 98%  Weight:      Height:      PainSc:  0-No pain      Isolation Precautions No active isolations  Medications Medications  sodium chloride flush (NS) 0.9 % injection 3 mL (has no administration in time range)  0.9 %  sodium chloride infusion (has no administration in time range)  acetaminophen (TYLENOL) tablet 650 mg (has no administration in time range)    Or   acetaminophen (TYLENOL) suppository 650 mg (has no administration in time range)  ondansetron (ZOFRAN) tablet 4 mg (has no administration in time range)    Or  ondansetron (ZOFRAN) injection 4 mg (has no administration in time range)  senna-docusate (Senokot-S) tablet 1 tablet (has no administration in time range)  cefTRIAXone (ROCEPHIN) 1 g in sodium chloride 0.9 % 100 mL IVPB (0 g Intravenous Stopped 01/03/23 1519)  lactated ringers bolus 1,000 mL (1,000 mLs Intravenous New Bag/Given 01/03/23 1656)  acetaminophen (TYLENOL) tablet 1,000 mg (1,000 mg Oral Given 01/03/23 1456)    Mobility walks     Focused Assessments     R Recommendations: See Admitting Provider Note  Report given to:   Additional Notes:  from home, typically walks but unable to walk today due to weakness

## 2023-01-03 NOTE — ED Provider Notes (Signed)
[place] EMERGENCY DEPARTMENT AT [facility] Provider Note   CSN: [id number] Arrival date & time: 01/03/23  1028     History  Chief Complaint  Patient presents with   Dizziness    Anne [name] is a 89 y.o. female.  HPI history of HTN, HLD, GERD, anxiety, nonobstructive CAD, asthma, and atrial fibrillation anticoagulated on Eliquis 5mg  BID.  Patient reports that she got up multiple times during the night to urinate.  She reports that she had urgency but was not getting dysuria.  She reports later in the morning after getting up she got very weak and felt that her legs could not really hold her.  She ended up on the ground and then she reports she could not get back up again.  She reports that she tried to get up a number of times and she hit her head lightly on the ground trying to get up but denies that she struck her head with the fall.  She does not have generalized headache but has an area that is tender on the back of her head.  Family members had just seen the patient within the past couple of days and report that she was well.  They have gone to church and done other activities.  She is alert and functions independently at baseline at home.  Patient denies she had any focal weakness numbness or tingling over the extremity but just generally felt too weak to get herself back up.  Reports she also developed some really intense chills at that time.  She had not had a fever that she was aware of.  She did not vomit.  She denies she is having any low back pain or abdominal pain.    Home Medications Prior to Admission medications   Medication Sig Start Date End Date Taking? Authorizing Provider  albuterol (VENTOLIN HFA) 108 (90 Base) MCG/ACT inhaler INHALE 1-2 PUFFS BY MOUTH EVERY 6 HOURS AS NEEDED FOR WHEEZE OR SHORTNESS OF BREATH 12/06/22   Kalman Shan, MD  amLODipine (NORVASC) 5 MG tablet Take 1 tablet by mouth daily.    [provider]  Calcium  Carb-Cholecalciferol 600-200 MG-UNIT TABS Take 1 tablet by mouth daily.    [provider]  cetirizine (ZYRTEC) 10 MG tablet Take 10 mg by mouth every morning.    [provider]  dorzolamide-timolol (COSOPT) 2-0.5 % ophthalmic solution Place 1 drop into the right eye 2 (two) times daily. 03/14/22   [provider]  ELIQUIS 5 MG TABS tablet TAKE 1 TABLET BY MOUTH TWICE A DAY 12/06/22   Eustace Pen, PA-C  ferrous sulfate 325 (65 FE) MG tablet Take 325 mg by mouth once a week.   Take once a week reported on: 01/01/2022    [provider]  fluticasone (FLONASE) 50 MCG/ACT nasal spray Place 1 spray into both nostrils 2 (two) times daily as needed for allergies.     [provider]  fluticasone furoate-vilanterol (BREO ELLIPTA) 100-25 MCG/ACT AEPB Inhale 1 puff into the lungs daily. 11/28/22   Kalman Shan, MD  gabapentin (NEURONTIN) 300 MG capsule Take 1 capsule (300 mg total) by mouth at bedtime. 01/01/22   Patel, Donika K, DO  GEMTESA 75 MG TABS Take 1 tablet by mouth daily. Patient not taking: Reported on 12/19/2022 09/22/22   [provider]  latanoprost (XALATAN) 0.005 % ophthalmic solution Place 1 drop into both eyes at bedtime.    [provider]  metoprolol succinate (  TOPROL-XL) 25 MG 24 hr tablet TAKE 1 TABLET (25 MG TOTAL) BY MOUTH DAILY. 03/06/22   Lyn Records, MD  Multiple Vitamin (MULTIVITAMIN) tablet Take 1 tablet by mouth every morning.     [provider]  nitroGLYCERIN (NITROSTAT) 0.4 MG SL tablet Place 1 tablet (0.4 mg total) under the tongue every 5 (five) minutes as needed for chest pain (Up to 3 doses in 15 minutes.). 03/20/22   Sharlene Dory, PA-C  rosuvastatin (CRESTOR) 20 MG tablet TAKE 1 TABLET BY MOUTH EVERY DAY 04/24/22   Leone Brand, NP      Allergies    Other, Shellfish allergy, Iodine, Latex, Methocarbamol, Pravastatin, and Adhesive [tape]    Review of Systems   Review of  Systems  Physical Exam Updated Vital Signs BP (!) 168/61   Pulse 87   Temp (!) 100.4 F (38 C) (Rectal)   Resp 15   Ht 5\' 7"  (1.702 m)   Wt 67 kg   SpO2 94%   BMI 23.13 kg/m  Physical Exam Constitutional:      Comments: Patient is alert.  She is nontoxic.  She is answering questions appropriately.  No respiratory distress.  Excellent condition for age.  HENT:     Head: Normocephalic and atraumatic.     Comments: I do not appreciate any hematomas but patient does endorse some tenderness to the back of the head.    Nose: Nose normal.     Mouth/Throat:     Mouth: Mucous membranes are moist.     Pharynx: Oropharynx is clear.  Eyes:     Extraocular Movements: Extraocular movements intact.     Pupils: Pupils are equal, round, and reactive to light.  Neck:     Comments: No midline C-spine tenderness to palpation. Cardiovascular:     Rate and Rhythm: Normal rate and regular rhythm.  Pulmonary:     Effort: Pulmonary effort is normal.     Breath sounds: Normal breath sounds.  Chest:     Chest wall: No tenderness.  Abdominal:     Palpations: Abdomen is soft.     Comments: Abdomen soft without guarding.  Patient does endorse some mild lower abdominal discomfort to palpation.  Musculoskeletal:        General: No swelling, deformity or signs of injury. Normal range of motion.     Right lower leg: No edema.     Left lower leg: No edema.  Skin:    General: Skin is warm and dry.  Neurological:     Comments: Patient is alert and mental status is generally clear although there is a subtle suggestion of confusion.  All movements are coordinated purposeful symmetric.  Patient follows commands appropriately.  Psychiatric:        Mood and Affect: Mood normal.     ED Results / Procedures / Treatments   Labs (all labs ordered are listed, but only abnormal results are displayed) Labs Reviewed  COMPREHENSIVE METABOLIC PANEL - Abnormal; Notable for the following components:      Result  Value   AST 43 (*)    GFR, Estimated 56 (*)    All other components within normal limits  CBC WITH DIFFERENTIAL/PLATELET - Abnormal; Notable for the following components:   RBC 3.69 (*)    Hemoglobin 10.9 (*)    HCT 33.6 (*)    Monocytes Absolute 1.3 (*)    All other components within normal limits  PROTIME-INR - Abnormal; Notable for the following components:  Prothrombin Time 15.7 (*)    All other components within normal limits  URINALYSIS, W/ REFLEX TO CULTURE (INFECTION SUSPECTED) - Abnormal; Notable for the following components:   Hgb urine dipstick MODERATE (*)    Ketones, ur 5 (*)    All other components within normal limits  I-STAT CG4 LACTIC ACID, ED - Abnormal; Notable for the following components:   Lactic Acid, Venous 4.4 (*)    All other components within normal limits  CULTURE, BLOOD (ROUTINE X 2)  CULTURE, BLOOD (ROUTINE X 2)  APTT  I-STAT CG4 LACTIC ACID, ED  TROPONIN I (HIGH SENSITIVITY)  TROPONIN I (HIGH SENSITIVITY)    EKG EKG Interpretation Date/Time:  Thursday January 03 2023 10:47:19 EDT Ventricular Rate:  93 PR Interval:  205 QRS Duration:  93 QT Interval:  332 QTC Calculation: 413 R Axis:   60  Text Interpretation: Sinus rhythm dynamic inferior t wave compared to previous, increased rate, no acute ischemic appearance Confirmed by Arby Barrette [phone]) on 01/03/2023 2:22:12 PM  Radiology DG Chest Port 1 View  Result Date: 01/03/2023 CLINICAL DATA:  Dizziness. EXAM: PORTABLE CHEST 1 VIEW COMPARISON:  Chest radiograph dated July 22, 2022. FINDINGS: The heart size and mediastinal contours are within normal limits. Aortic calcification. No focal consolidation, pneumothorax, or sizable pleural effusion. No acute osseous abnormality. IMPRESSION: No active cardiopulmonary disease. Electronically Signed   By: Hart Robinsons M.D.   On: 01/03/2023 13:42   CT Head Wo Contrast  Result Date: 01/03/2023 CLINICAL DATA:  Minor head trauma EXAM: CT HEAD  WITHOUT CONTRAST TECHNIQUE: Contiguous axial images were obtained from the base of the skull through the vertex without intravenous contrast. RADIATION DOSE REDUCTION: This exam was performed according to the departmental dose-optimization program which includes automated exposure control, adjustment of the mA and/or kV according to patient size and/or use of iterative reconstruction technique. COMPARISON:  05/31/2019 FINDINGS: Brain: No evidence of acute infarction, hemorrhage, hydrocephalus, extra-axial collection or mass lesion/mass effect. Generalized brain atrophy that is mild for age. Age normal white matter appearance Vascular: No hyperdense vessel or unexpected calcification. Skull: No acute or aggressive finding Sinuses/Orbits: Chronic sinusitis with generalized mucosal thickening. Prior endoscopic sinus surgery.No acute finding. IMPRESSION: No evidence of intracranial injury. Electronically Signed   By: Tiburcio Pea M.D.   On: 01/03/2023 13:19    Procedures Procedures   CRITICAL CARE Performed by: Arby Barrette   Total critical care time: 30 minutes  Critical care time was exclusive of separately billable procedures and treating other patients.  Critical care was necessary to treat or prevent imminent or life-threatening deterioration.  Critical care was time spent personally by me on the following activities: development of treatment plan with patient and/or surrogate as well as nursing, discussions with consultants, evaluation of patient's response to treatment, examination of patient, obtaining history from patient or surrogate, ordering and performing treatments and interventions, ordering and review of laboratory studies, ordering and review of radiographic studies, pulse oximetry and re-evaluation of patient's condition.  Medications Ordered in ED Medications  lactated ringers infusion ( Intravenous New Bag/Given 01/03/23 1210)  cefTRIAXone (ROCEPHIN) 1 g in sodium chloride 0.9  % 100 mL IVPB (has no administration in time range)  lactated ringers bolus 1,000 mL (has no administration in time range)  acetaminophen (TYLENOL) tablet 1,000 mg (has no administration in time range)    ED Course/ Medical Decision Making/ A&P  Medical Decision Making Amount and/or Complexity of Data Reviewed Labs: ordered. Radiology: ordered. ECG/medicine tests: ordered.  Risk OTC drugs. Prescription drug management. Decision regarding hospitalization.   Patient presents as outlined.  Differential diagnosis includes intracerebral hemorrhage on Eliquis\sepsis\metabolic derangement\CVA.  Will proceed with broad diagnostic evaluation.  Patient does feel hot to touch and rectal temperature was 100.4.  With urinary frequency, I have higher suspicion for infectious etiology with general weakness and very subtle mental status change.  Will proceed with CT scan of the head patient does report she did hit her head.  Will also obtain chest x-ray for possible pneumonia.  Urinalysis greater than 50 RBC 0-5 WBC.  White count 6.0.  Lactic initially was 1.1 and repeat was 4.4.  Comprehensive metabolic panel within normal limits GFR 56.  CT head reviewed by radiology no acute findings.  Chest x-ray reviewed radiology no acute findings.  Given patient's symptoms of frequent urination, low-grade fever and general weakness, will treat for urinary source for probable early sepsis.  Given patient's age and general weakness with lactic that elevated from normal to 4 I do not feel patient is stable for home management.  Blood pressures remain normotensive to hypertensive.  Will administer 1 L of lactated Ringer's and continue to observe with repeat vital signs and lactic acid.  Given suspected urinary source but limited symptoms will also proceed with CT scan of the abdomen pelvis to rule out a retained kidney stone.        Final Clinical Impression(s) / ED  Diagnoses Final diagnoses:  Acute cystitis with hematuria  General weakness  SIRS (systemic inflammatory response syndrome) (HCC)    Rx / DC Orders ED Discharge Orders     None         Arby Barrette, MD 01/04/23 1652

## 2023-01-03 NOTE — H&P (Signed)
History and Physical    Anne [name] ZOX:[id number] DOB: 1930-12-02 DOA: 01/03/2023  PCP: Genelle Gather, DO  Patient coming from: Home  I have personally briefly reviewed patient's old medical records in [facility]  Chief Complaint: Near syncope  HPI: Anne [name] is a 89 y.o. female with medical history significant for PAF on Eliquis, asthma, HTN, HLD who presented to the ED for evaluation after a near syncopal event.  Patient states that last night she was walking to her bathroom when she became lightheaded, lost her balance, and slowly fell to the floor.  She says she did hit her head on the bathroom floor but did not lose consciousness.  She was able to get up and use the toilet.  Afterwards she was walking back to her bed and when she sat down into her bed she slid off and again fell to the floor.  She felt lightheaded again when this happened.  She says she did not lose consciousness.  She felt very weak in her legs and was unable to get up on her own.  She has had some suprapubic discomfort.  She denies any nausea, vomiting, chest pain, dysuria, or obvious bleeding.  She says she was on the floor for about 2 hours before she called family for assistance. She came to the ED for further evaluation.  Patient lives alone and is independent in ADLs at baseline.  ED Course  Labs/Imaging on admission: I have personally reviewed following labs and imaging studies.  Initial vitals showed BP 152/67, pulse 91, RR 16, temp 100.2 F, SpO2 98% on room air.  Tmax 100.4 F rectally.  Labs show WBC 6.0, hemoglobin 10.9, platelets 237,000, sodium 135, potassium 3.5, bicarb 23, BUN 13, creatinine 0.97, serum glucose 96, AST 43, ALT 26, alk phos 51, total bilirubin 0.5, troponin 17 > 16.  Lactic acid 1.2 > 4.4.  Urinalysis shows >50 RBCs, negative nitrites, negative leukocytes, 0-5 WBCs, no bacteria.  Portable chest x-ray negative for focal consolidation, edema,  effusion.  CT head without contrast negative for evidence of intracranial injury.  CT abdomen/pelvis without contrast was negative for acute abnormality.  Prominent stool in the right and transverse colon noted.  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  Patient was given 1 L LR, IV ceftriaxone.  The hospitalist service was consulted to admit for further evaluation and management.  Review of Systems: All systems reviewed and are negative except as documented in history of present illness above.   Past Medical History:  Diagnosis Date   Allergic rhinitis    Arthritis    Asthma    Atypical chest pain    Collapse of right lung    Dyslipidemia    Gastroesophageal reflux disease    Glaucoma    Hematuria    Hyperlipidemia    Lung nodules    PVC's (premature ventricular contractions) 02/21/2021   Sinus bradycardia 02/21/2021    Past Surgical History:  Procedure Laterality Date   ANAL FISSURE REPAIR     BREAST BIOPSY Right    CATARACT EXTRACTION Bilateral    MASS EXCISION  04/11/2012   Procedure: EXCISION MASS;  Surgeon: Nicki Reaper, MD;  Location: [facility];  Service: Orthopedics;  Laterality: Right;  Excision of mass right upper arm   MEDIASTINOSCOPY  4/11   NASAL SINUS SURGERY  TOTAL ABDOMINAL HYSTERECTOMY W/ BILATERAL SALPINGOOPHORECTOMY      Social History:  reports that she quit smoking about 64 years ago. Her smoking use included cigarettes. She started smoking about 71 years ago. She has never used smokeless tobacco. She reports that she does not drink alcohol and does not use drugs.  Allergies  Allergen Reactions   Other Shortness Of Breath    Pt states she has asthma and seafood causes breathing problems.    Shellfish Allergy Shortness Of Breath    Pt states she has asthma and seafood causes  breathing problems.   Iodine Other (See Comments)    REACTION: " UNSURE TOLD TO LIST AS ALLERGEN BY MD" REACTION: " UNSURE TOLD TO LIST AS ALLERGEN BY MD"   Latex Itching and Rash   Methocarbamol Other (See Comments)   Pravastatin Other (See Comments)   Adhesive [Tape] Rash    Family History  Problem Relation Age of Onset   Cancer Mother    Heart disease Father    Breast cancer Cousin    Heart attack Neg Hx      Prior to Admission medications   Medication Sig Start Date End Date Taking? Authorizing Provider  albuterol (VENTOLIN HFA) 108 (90 Base) MCG/ACT inhaler INHALE 1-2 PUFFS BY MOUTH EVERY 6 HOURS AS NEEDED FOR WHEEZE OR SHORTNESS OF BREATH 12/06/22   Kalman Shan, MD  amLODipine (NORVASC) 5 MG tablet Take 1 tablet by mouth daily.    [provider]  Calcium Carb-Cholecalciferol 600-200 MG-UNIT TABS Take 1 tablet by mouth daily.    [provider]  cetirizine (ZYRTEC) 10 MG tablet Take 10 mg by mouth every morning.    [provider]  dorzolamide-timolol (COSOPT) 2-0.5 % ophthalmic solution Place 1 drop into the right eye 2 (two) times daily. 03/14/22   [provider]  ELIQUIS 5 MG TABS tablet TAKE 1 TABLET BY MOUTH TWICE A DAY 12/06/22   Eustace Pen, PA-C  ferrous sulfate 325 (65 FE) MG tablet Take 325 mg by mouth once a week.   Take once a week reported on: 01/01/2022    [provider]  fluticasone (FLONASE) 50 MCG/ACT nasal spray Place 1 spray into both nostrils 2 (two) times daily as needed for allergies.     [provider]  fluticasone furoate-vilanterol (BREO ELLIPTA) 100-25 MCG/ACT AEPB Inhale 1 puff into the lungs daily. 11/28/22   Kalman Shan, MD  gabapentin (NEURONTIN) 300 MG capsule Take 1 capsule (300 mg total) by mouth at bedtime. 01/01/22   Daesean Lazarz, Donika K, DO  GEMTESA 75 MG TABS Take 1 tablet by mouth daily. Patient not taking: Reported on 12/19/2022 09/22/22   [provider]  latanoprost  (XALATAN) 0.005 % ophthalmic solution Place 1 drop into both eyes at bedtime.    [provider]  metoprolol succinate (TOPROL-XL) 25 MG 24 hr tablet TAKE 1 TABLET (25 MG TOTAL) BY MOUTH DAILY. 03/06/22   Lyn Records, MD  Multiple Vitamin (MULTIVITAMIN) tablet Take 1 tablet by mouth every morning.     [provider]  nitroGLYCERIN (NITROSTAT) 0.4 MG SL tablet Place 1 tablet (0.4 mg total) under the tongue every 5 (five) minutes as needed for chest pain (Up to 3 doses in 15 minutes.). 03/20/22   Sharlene Dory, PA-C  rosuvastatin (CRESTOR) 20 MG tablet TAKE 1 TABLET BY MOUTH EVERY DAY 04/24/22   Leone Brand, NP    Physical Exam: Vitals:   01/03/23 1701 01/03/23 1726  01/03/23 1730 01/03/23 1803  BP: (!) 93/56 (!) 147/64  (!) 157/51  Pulse: 73 72 78 80  Resp: 16 16 18 20   Temp:      TempSrc:      SpO2: 93% 97% 95% 98%  Weight:      Height:       Constitutional: Resting in bed, NAD, calm, comfortable Eyes: EOMI, lids and conjunctivae normal ENMT: Mucous membranes are moist. Posterior pharynx clear of any exudate or lesions.Normal dentition.  Neck: normal, supple, no masses. Respiratory: clear to auscultation bilaterally, no wheezing, no crackles. Normal respiratory effort. No accessory muscle use.  Cardiovascular: Regular rate and rhythm, no murmurs / rubs / gallops. No extremity edema. 2+ pedal pulses. Abdomen: Mild suprapubic tenderness, no masses palpated. No hepatosplenomegaly. Bowel sounds positive.  Musculoskeletal: no clubbing / cyanosis. No joint deformity upper and lower extremities. Good ROM, no contractures. Normal muscle tone.  Skin: no rashes, lesions, ulcers. No induration Neurologic: CN 2-12 grossly intact. Sensation intact. Strength 5/5 in all 4.  Psychiatric: Normal judgment and insight. Alert and oriented x 3. Normal mood.   EKG: Personally reviewed. Sinus rhythm, rate 93, no acute ischemic changes.  Previous EKG 12/19/2022 showed sinus bradycardia  rate 55, first-degree AV block.  Assessment/Plan Principal Problem:   Near syncope Active Problems:   Asthma   Essential hypertension   PAF (paroxysmal atrial fibrillation) (HCC)   Lactic acidosis   Hematuria   Anne [name] is a 89 y.o. female with medical history significant for PAF on Eliquis, asthma, HTN, HLD who is admitted after near syncopal episode.  Assessment and Plan: Near syncope Hypotension with lactic acidosis: Patient with 2 near syncopal episodes at home without LOC.  Transient hypotension noted while in the ED with lactic acid 1.2 > 4.4.  Suspect orthostasis as etiology of near syncopal episodes.  Borderline fever 100.4 F but no obvious infectious process for symptoms.  Will follow blood cultures. -Keep on telemetry -Check orthostatic vitals -Continue IV fluid hydration and recheck lactic acid level tonight -PT/OT eval  Hematuria: UA showed >50 RBCs otherwise negative for infection.  No gross hematuria.  On review of records it appears that patient has chronic hematuria.  CT A/P negative for acute process.  Paroxysmal atrial fibrillation: In sinus rhythm with controlled rate on admission.  Continue Eliquis and resume Toprol-XL tomorrow.  Hypertension: Holding amlodipine due to transient hypotension.  Resume Toprol-XL tomorrow as BP allows.  Asthma: Stable.  Continue Breo Ellipta and albuterol as needed.  Hyperlipidemia: Continue rosuvastatin.   DVT prophylaxis:  apixaban (ELIQUIS) tablet 5 mg   Code Status: Full code, confirmed with patient on admission Family Communication: Cousin and niece at bedside Disposition Plan: From home and likely discharge to home pending clinical progress Consults called: None Severity of Illness: The appropriate patient status for this patient is OBSERVATION. Observation status is judged to be reasonable and necessary in order to provide the required intensity of service to ensure the patient's safety. The patient's  presenting symptoms, physical exam findings, and initial radiographic and laboratory data in the context of their medical condition is felt to place them at decreased risk for further clinical deterioration. Furthermore, it is anticipated that the patient will be medically stable for discharge from the hospital within 2 midnights of admission.   Darreld Mclean MD [facility]  If 7PM-7AM, please contact night-coverage [email or web address]  01/03/2023, 6:29 PM

## 2023-01-03 NOTE — ED Triage Notes (Signed)
Pt arrives via [facility] from home with reports of sliding out of the bed today. Complaints of dizziness when sitting up. Pt has not taken HTN meds today yet. States she felt bad last night.

## 2023-01-04 ENCOUNTER — Observation Stay (HOSPITAL_COMMUNITY): Payer: Medicare PPO

## 2023-01-04 DIAGNOSIS — R509 Fever, unspecified: Secondary | ICD-10-CM | POA: Diagnosis not present

## 2023-01-04 DIAGNOSIS — Z91013 Allergy to seafood: Secondary | ICD-10-CM | POA: Diagnosis not present

## 2023-01-04 DIAGNOSIS — Z91040 Latex allergy status: Secondary | ICD-10-CM | POA: Diagnosis not present

## 2023-01-04 DIAGNOSIS — I10 Essential (primary) hypertension: Secondary | ICD-10-CM | POA: Diagnosis present

## 2023-01-04 DIAGNOSIS — Z8249 Family history of ischemic heart disease and other diseases of the circulatory system: Secondary | ICD-10-CM | POA: Diagnosis not present

## 2023-01-04 DIAGNOSIS — Z803 Family history of malignant neoplasm of breast: Secondary | ICD-10-CM | POA: Diagnosis not present

## 2023-01-04 DIAGNOSIS — E785 Hyperlipidemia, unspecified: Secondary | ICD-10-CM | POA: Diagnosis present

## 2023-01-04 DIAGNOSIS — I959 Hypotension, unspecified: Secondary | ICD-10-CM | POA: Diagnosis present

## 2023-01-04 DIAGNOSIS — Z515 Encounter for palliative care: Secondary | ICD-10-CM | POA: Diagnosis not present

## 2023-01-04 DIAGNOSIS — Z91048 Other nonmedicinal substance allergy status: Secondary | ICD-10-CM | POA: Diagnosis not present

## 2023-01-04 DIAGNOSIS — Z79899 Other long term (current) drug therapy: Secondary | ICD-10-CM | POA: Diagnosis not present

## 2023-01-04 DIAGNOSIS — F17210 Nicotine dependence, cigarettes, uncomplicated: Secondary | ICD-10-CM | POA: Diagnosis present

## 2023-01-04 DIAGNOSIS — U071 COVID-19: Secondary | ICD-10-CM | POA: Diagnosis present

## 2023-01-04 DIAGNOSIS — K219 Gastro-esophageal reflux disease without esophagitis: Secondary | ICD-10-CM | POA: Diagnosis present

## 2023-01-04 DIAGNOSIS — R42 Dizziness and giddiness: Secondary | ICD-10-CM

## 2023-01-04 DIAGNOSIS — R55 Syncope and collapse: Secondary | ICD-10-CM | POA: Diagnosis not present

## 2023-01-04 DIAGNOSIS — I2510 Atherosclerotic heart disease of native coronary artery without angina pectoris: Secondary | ICD-10-CM | POA: Diagnosis present

## 2023-01-04 DIAGNOSIS — E872 Acidosis, unspecified: Secondary | ICD-10-CM | POA: Diagnosis present

## 2023-01-04 DIAGNOSIS — H409 Unspecified glaucoma: Secondary | ICD-10-CM | POA: Diagnosis present

## 2023-01-04 DIAGNOSIS — Z809 Family history of malignant neoplasm, unspecified: Secondary | ICD-10-CM | POA: Diagnosis not present

## 2023-01-04 DIAGNOSIS — E876 Hypokalemia: Secondary | ICD-10-CM | POA: Diagnosis present

## 2023-01-04 DIAGNOSIS — I480 Paroxysmal atrial fibrillation: Secondary | ICD-10-CM | POA: Diagnosis present

## 2023-01-04 DIAGNOSIS — Z888 Allergy status to other drugs, medicaments and biological substances status: Secondary | ICD-10-CM | POA: Diagnosis not present

## 2023-01-04 DIAGNOSIS — J45909 Unspecified asthma, uncomplicated: Secondary | ICD-10-CM | POA: Diagnosis present

## 2023-01-04 DIAGNOSIS — K551 Chronic vascular disorders of intestine: Secondary | ICD-10-CM | POA: Diagnosis present

## 2023-01-04 DIAGNOSIS — Z7189 Other specified counseling: Secondary | ICD-10-CM | POA: Diagnosis not present

## 2023-01-04 DIAGNOSIS — R531 Weakness: Secondary | ICD-10-CM | POA: Diagnosis present

## 2023-01-04 DIAGNOSIS — Z7901 Long term (current) use of anticoagulants: Secondary | ICD-10-CM | POA: Diagnosis not present

## 2023-01-04 LAB — BASIC METABOLIC PANEL
Anion gap: 13 (ref 5–15)
BUN: 8 mg/dL (ref 8–23)
CO2: 24 mmol/L (ref 22–32)
Calcium: 8.7 mg/dL — ABNORMAL LOW (ref 8.9–10.3)
Chloride: 99 mmol/L (ref 98–111)
Creatinine, Ser: 1.08 mg/dL — ABNORMAL HIGH (ref 0.44–1.00)
GFR, Estimated: 49 mL/min — ABNORMAL LOW (ref >60)
Glucose, Bld: 120 mg/dL — ABNORMAL HIGH (ref 70–99)
Potassium: 3.1 mmol/L — ABNORMAL LOW (ref 3.5–5.1)
Sodium: 136 mmol/L (ref 135–145)

## 2023-01-04 LAB — RESP PANEL BY RT-PCR (RSV, FLU A&B, COVID)  RVPGX2
Influenza A by PCR: NEGATIVE
Influenza B by PCR: NEGATIVE
Resp Syncytial Virus by PCR: NEGATIVE
SARS Coronavirus 2 by RT PCR: POSITIVE — AB

## 2023-01-04 LAB — URINALYSIS, COMPLETE (UACMP) WITH MICROSCOPIC
Bilirubin Urine: NEGATIVE
Glucose, UA: NEGATIVE mg/dL
Ketones, ur: NEGATIVE mg/dL
Leukocytes,Ua: NEGATIVE
Nitrite: NEGATIVE
Protein, ur: NEGATIVE mg/dL
Specific Gravity, Urine: 1.004 — ABNORMAL LOW (ref 1.005–1.030)
pH: 6 (ref 5.0–8.0)

## 2023-01-04 LAB — CBC
HCT: 37.8 % (ref 36.0–46.0)
Hemoglobin: 12.3 g/dL (ref 12.0–15.0)
MCH: 29.3 pg (ref 26.0–34.0)
MCHC: 32.5 g/dL (ref 30.0–36.0)
MCV: 90 fL (ref 80.0–100.0)
Platelets: 241 10*3/uL (ref 150–400)
RBC: 4.2 MIL/uL (ref 3.87–5.11)
RDW: 13.6 % (ref 11.5–15.5)
WBC: 6.2 10*3/uL (ref 4.0–10.5)
nRBC: 0 % (ref 0.0–0.2)

## 2023-01-04 LAB — MAGNESIUM: Magnesium: 1.6 mg/dL — ABNORMAL LOW (ref 1.7–2.4)

## 2023-01-04 LAB — LACTIC ACID, PLASMA
Lactic Acid, Venous: 1 mmol/L (ref 0.5–1.9)
Lactic Acid, Venous: 2.4 mmol/L (ref 0.5–1.9)

## 2023-01-04 LAB — C-REACTIVE PROTEIN: CRP: 13 mg/dL — ABNORMAL HIGH (ref <1.0)

## 2023-01-04 MED ORDER — SODIUM CHLORIDE 0.9 % IV SOLN
1.0000 g | INTRAVENOUS | Status: DC
  Administered 2023-01-04: 1 g via INTRAVENOUS
  Filled 2023-01-04: qty 10

## 2023-01-04 MED ORDER — POTASSIUM CHLORIDE CRYS ER 20 MEQ PO TBCR
40.0000 meq | EXTENDED_RELEASE_TABLET | ORAL | Status: AC
  Administered 2023-01-04 (×2): 40 meq via ORAL
  Filled 2023-01-04 (×2): qty 2

## 2023-01-04 MED ORDER — INFLUENZA VAC A&B SURF ANT ADJ 0.5 ML IM SUSY
0.5000 mL | PREFILLED_SYRINGE | INTRAMUSCULAR | Status: DC
  Filled 2023-01-04: qty 0.5

## 2023-01-04 MED ORDER — SODIUM CHLORIDE 0.9 % IV SOLN: INTRAVENOUS | Status: DC

## 2023-01-04 MED ORDER — MECLIZINE HCL 12.5 MG PO TABS
12.5000 mg | ORAL_TABLET | Freq: Three times a day (TID) | ORAL | Status: DC | PRN
  Administered 2023-01-04 – 2023-01-05 (×2): 12.5 mg via ORAL
  Filled 2023-01-04 (×3): qty 1

## 2023-01-04 MED ORDER — GUAIFENESIN-DM 100-10 MG/5ML PO SYRP
10.0000 mL | ORAL_SOLUTION | ORAL | Status: DC | PRN
  Administered 2023-01-04 – 2023-01-07 (×3): 10 mL via ORAL
  Filled 2023-01-04 (×3): qty 10

## 2023-01-04 MED ORDER — MENTHOL 3 MG MT LOZG
1.0000 | LOZENGE | OROMUCOSAL | Status: DC | PRN
  Administered 2023-01-04 – 2023-01-07 (×2): 3 mg via ORAL
  Filled 2023-01-04 (×2): qty 9

## 2023-01-04 MED ORDER — MAGNESIUM SULFATE 2 GM/50ML IV SOLN
2.0000 g | Freq: Once | INTRAVENOUS | Status: AC
  Administered 2023-01-04: 2 g via INTRAVENOUS
  Filled 2023-01-04: qty 50

## 2023-01-04 NOTE — Consult Note (Signed)
[facility] [place]  ASSESSMENT / PLAN: 89 y.o. female with celiac and superior mesenteric artery atherosclerosis.  The patient has no symptoms of acute or chronic mesenteric ischemia.  She should continue Eliquis and statin therapies.  She can follow-up with me in 3 to 6 months with mesenteric artery duplex.  CHIEF COMPLAINT: near syncope  HISTORY OF PRESENT ILLNESS: Anne [name] is a 89 y.o. female admitted to the hospital for near syncope.  Patient found to be febrile in the hospital.  She initially reported some abdominal pain.  She was found to have significant urinary tract infection.  A CT scan of the abdomen pelvis was performed without contrast.  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  On my evaluation, the patient is sitting upright.  She reports she is feeling well.  She denies any abdominal pain.  She denies any postprandial abdominal pain in the weeks preceding admission.  She denies unintentional weight loss.  She is not afraid to eat.  We reviewed her CT findings and the common symptoms associated with acute and chronic mesenteric ischemia.  Past Medical History:  Diagnosis Date   Allergic rhinitis    Arthritis    Asthma    Atypical chest pain    Collapse of right lung    Dyslipidemia    Gastroesophageal reflux disease    Glaucoma    Hematuria    Hyperlipidemia    Lung nodules    PVC's (premature ventricular contractions) 02/21/2021   Sinus bradycardia 02/21/2021    Past Surgical History:  Procedure Laterality Date   ANAL FISSURE REPAIR     BREAST BIOPSY Right    CATARACT EXTRACTION Bilateral    MASS EXCISION  04/11/2012   Procedure: EXCISION MASS;  Surgeon: Nicki Reaper, MD;  Location:  SURGERY CENTER;  Service: Orthopedics;  Laterality: Right;  Excision of mass right upper arm   MEDIASTINOSCOPY   4/11   NASAL SINUS SURGERY     TOTAL ABDOMINAL HYSTERECTOMY W/ BILATERAL SALPINGOOPHORECTOMY      Family History  Problem Relation Age of Onset   Cancer Mother    Heart disease Father    Breast cancer Cousin    Heart attack Neg Hx     Social History   Socioeconomic History   Marital status: Widowed    Spouse name: Not on file   Number of children: Not on file   Years of education: Not on file   Highest education level: Not on file  Occupational History   Occupation: retired  Tobacco Use   Smoking status: Former    Current packs/day: 0.00    Types: Cigarettes    Start date: 01-20-1952    Quit date: 1960    Years since quitting: 64.8   Smokeless tobacco: Never   Tobacco comments:    social smoker  Advertising account planner   Vaping status: Never Used  Substance and Sexual Activity   Alcohol use: No    Alcohol/week: 0.0 standard drinks of alcohol   Drug use: No   Sexual activity: Never    Birth control/protection: Surgical  Other Topics Concern   Not on file  Social History Narrative   Retired Public relations account executive.  Lives alone, husband passed away in Jan 19, 2017.  No children.   Right handed   One level home   Social Determinants of Health  Financial Resource Strain: Not on file  Food Insecurity: No Food Insecurity (01/03/2023)   Hunger Vital Sign    Worried About Running Out of Food in the Last Year: Never true    Ran Out of Food in the Last Year: Never true  Transportation Needs: No Transportation Needs (01/03/2023)   PRAPARE - Administrator, Civil Service (Medical): No    Lack of Transportation (Non-Medical): No  Physical Activity: Not on file  Stress: Not on file  Social Connections: Not on file  Intimate Partner Violence: Not At Risk (01/03/2023)   Humiliation, Afraid, Rape, and Kick questionnaire    Fear of Current or Ex-Partner: No    Emotionally Abused: No    Physically Abused: No    Sexually Abused: No    Allergies  Allergen Reactions   Other Shortness Of  Breath    Pt states she has asthma and seafood causes breathing problems.    Shellfish Allergy Shortness Of Breath    Pt states she has asthma and seafood causes breathing problems.   Iodine Other (See Comments)    REACTION: " UNSURE TOLD TO LIST AS ALLERGEN BY MD" REACTION: " UNSURE TOLD TO LIST AS ALLERGEN BY MD"   Latex Itching and Rash   Methocarbamol Other (See Comments)   Pravastatin Other (See Comments)   Adhesive [Tape] Rash    Current Facility-Administered Medications  Medication Dose Route Frequency Provider Last Rate Last Admin   acetaminophen (TYLENOL) tablet 650 mg  650 mg Oral Q6H PRN Charlsie Quest, MD   650 mg at 01/04/23 1308   Or   acetaminophen (TYLENOL) suppository 650 mg  650 mg Rectal Q6H PRN Darreld Mclean R, MD       albuterol (PROVENTIL) (2.5 MG/3ML) 0.083% nebulizer solution 2.5 mg  2.5 mg Inhalation Q6H PRN Charlsie Quest, MD       apixaban (ELIQUIS) tablet 5 mg  5 mg Oral BID Darreld Mclean R, MD   5 mg at 01/04/23 0948   cefTRIAXone (ROCEPHIN) 1 g in sodium chloride 0.9 % 100 mL IVPB  1 g Intravenous Q24H Alekh, Kshitiz, MD 200 mL/hr at 01/04/23 1009 1 g at 01/04/23 1009   fluticasone furoate-vilanterol (BREO ELLIPTA) 100-25 MCG/ACT 1 puff  1 puff Inhalation Daily Darreld Mclean R, MD   1 puff at 01/04/23 0832   gabapentin (NEURONTIN) capsule 300 mg  300 mg Oral QHS Darreld Mclean R, MD   300 mg at 01/03/23 2113   [START ON 01/05/2023] influenza vaccine adjuvanted (FLUAD) injection 0.5 mL  0.5 mL Intramuscular Tomorrow-1000 Darreld Mclean R, MD       menthol-cetylpyridinium (CEPACOL) lozenge 3 mg  1 lozenge Oral PRN Howerter, Justin B, DO   3 mg at 01/04/23 0508   metoprolol succinate (TOPROL-XL) 24 hr tablet 25 mg  25 mg Oral Daily Darreld Mclean R, MD   25 mg at 01/04/23 0948   ofloxacin (OCUFLOX) 0.3 % ophthalmic solution 5 drop  5 drop Right EAR Daily Darreld Mclean R, MD   5 drop at 01/03/23 2113   ondansetron (ZOFRAN) tablet 4 mg  4 mg Oral Q6H PRN Charlsie Quest, MD       Or   ondansetron (ZOFRAN) injection 4 mg  4 mg Intravenous Q6H PRN Darreld Mclean R, MD       potassium chloride SA (KLOR-CON M) CR tablet 40 mEq  40 mEq Oral Q4H Hanley Ben, Kshitiz, MD   40 mEq at 01/04/23 [phone]  rosuvastatin (CRESTOR) tablet 20 mg  20 mg Oral Daily Darreld Mclean R, MD   20 mg at 01/04/23 0948   senna-docusate (Senokot-S) tablet 1 tablet  1 tablet Oral QHS PRN Charlsie Quest, MD       sodium chloride flush (NS) 0.9 % injection 3 mL  3 mL Intravenous Q12H Charlsie Quest, MD   3 mL at 01/03/23 2113   Facility-Administered Medications Ordered in Other Encounters  Medication Dose Route Frequency Provider Last Rate Last Admin   regadenoson (LEXISCAN) injection SOLN 0.4 mg  0.4 mg Intravenous Once Quintella Reichert, MD       regadenoson (LEXISCAN) injection SOLN 0.4 mg  0.4 mg Intravenous Once Little Ishikawa, MD       technetium tetrofosmin (TC-MYOVIEW) injection 32.3 millicurie  32.3 millicurie Intravenous Once PRN Little Ishikawa, MD        PHYSICAL EXAM Vitals:   01/04/23 0525 01/04/23 0559 01/04/23 0756 01/04/23 0832  BP: (!) 169/79 (!) 177/64 (!) 133/58   Pulse: 98 96 84   Resp: 16 16 18    Temp: (!) 100.9 F (38.3 C) (!) 102.3 F (39.1 C) (!) 100.8 F (38.2 C)   TempSrc: Oral Oral    SpO2: (!) 89% 100% 94% 95%  Weight:      Height:       Elderly woman in no distress Regular rate and rhythm Unlabored breathing Soft, nontender, nondistended abdomen  PERTINENT LABORATORY AND RADIOLOGIC DATA  Most recent CBC    Latest Ref Rng & Units 01/04/2023    6:04 AM 01/03/2023   10:55 AM 08/30/2022   11:13 AM  CBC  WBC 4.0 - 10.5 K/uL 6.2  6.0  6.1   Hemoglobin 12.0 - 15.0 g/dL 16.1  09.6  04.5   Hematocrit 36.0 - 46.0 % 37.8  33.6  37.7   Platelets 150 - 400 K/uL 241  237  260      Most recent CMP    Latest Ref Rng & Units 01/04/2023    6:04 AM 01/03/2023   10:55 AM 07/22/2022    1:01 PM  CMP  Glucose 70 - 99 mg/dL 409  96  82    BUN 8 - 23 mg/dL 8  13  27    Creatinine 0.44 - 1.00 mg/dL 8.11  9.14  7.82   Sodium 135 - 145 mmol/L 136  135  140   Potassium 3.5 - 5.1 mmol/L 3.1  3.5  4.1   Chloride 98 - 111 mmol/L 99  103  106   CO2 22 - 32 mmol/L 24  23  24    Calcium 8.9 - 10.3 mg/dL 8.7  8.9  9.2   Total Protein 6.5 - 8.1 g/dL  6.5    Total Bilirubin 0.3 - 1.2 mg/dL  0.5    Alkaline Phos 38 - 126 U/L  51    AST 15 - 41 U/L  43    ALT 0 - 44 U/L  26      Renal function Estimated Creatinine Clearance: 33.7 mL/min (A) (by C-G formula based on SCr of 1.08 mg/dL (H)).  No results found for: "HGBA1C"  LDL Chol Calc (NIH)  Date Value Ref Range Status  12/12/2021 81 0 - 99 mg/dL Final    CT scan without contrast personally reviewed There is calcified atherosclerotic plaque at the origins of the celiac and superior mesenteric artery.  Given the lack of contrast, severity of stenosis cannot be estimated.  No  signs in the small bowel or colon of stool ischemia.  Rande Brunt. Lenell Antu, MD FACS [facility] Phone Number: [phone] 01/04/2023 10:29 AM   Total time spent on preparing this encounter including chart review, data review, collecting history, examining the patient, coordinating care for this new patient, 60 minutes.  Portions of this report may have been transcribed using voice recognition software.  Every effort has been made to ensure accuracy; however, inadvertent computerized transcription errors may still be present.

## 2023-01-04 NOTE — Progress Notes (Signed)
TRH night cross cover note:   I was notified by RN that the patient has spiked her first objective fever during this hospitalization, noting temperature max overnight of 102.3.  Heart rates in the 90s, systolic blood pressures in the 60s to 70s; respiratory rate 16 and oxygen saturation 99 to 100% on room air.  In the absence of any known underlying infectious process, criteria for sepsis not currently met.  In the setting of new objective fever will expand/revisit infectious workup.  Specifically, I\ have ordered an updated chest x-ray, urinalysis, as well as placed new order for blood cultures x 2.  I  also ordered stat lactic acid level.  In the absence of any known underlying infectious source, will refrain from IV antibiotics at this time.    Newton Pigg, DO Hospitalist

## 2023-01-04 NOTE — Progress Notes (Signed)
TRH night cross cover note:   I was notified by RN that the patient is complaining of a sore throat.  I subsequently placed order for prn Cepacol throat lozenges     Newton Pigg, DO Hospitalist

## 2023-01-04 NOTE — NC FL2 (Signed)
[place] MEDICAID [facility] LEVEL OF CARE FORM     IDENTIFICATION  Patient Name: Anne [name] Birthdate: [birth year] Sex: female Admission Date (Current Location): 01/03/2023  [facility] and [place] Number:  Producer, television/film/video and Address:  [facility], [address], [place], [place] [address]      Provider Number: [id number]  Attending Physician Name and Address:  Glade Lloyd, MD  Relative Name and Phone Number:  Remus Blake (Sister)  [phone]    Current Level of Care: Hospital Recommended Level of Care: Skilled Nursing Facility Prior Approval Number:    Date Approved/Denied:   PASRR Number: [id number]  Discharge Plan: SNF    Current Diagnoses: Patient Active Problem List   Diagnosis Date Noted   Postural dizziness with presyncope 01/04/2023   Lactic acidosis 01/03/2023   Hematuria 01/03/2023   PAF (paroxysmal atrial fibrillation) (HCC) 07/31/2022   Hypercoagulable state due to paroxysmal atrial fibrillation (HCC) 07/31/2022   Shellfish allergy 03/08/2022   PVC's (premature ventricular contractions) symptomatic 02/21/2021   Sinus bradycardia 02/21/2021   Near syncope 02/20/2021   Arthritis 04/08/2018   Glaucoma 04/08/2018   Herpes simplex type 2 infection 04/08/2018   Uterine leiomyoma 04/08/2018   Non-recurrent acute suppurative otitis media of left ear with spontaneous rupture of tympanic membrane 01/10/2018   Pain 05/29/2017   Throat pain in adult 05/28/2017   Thrush of mouth and esophagus (HCC) 05/28/2017   Acute bacterial sinusitis 10/10/2016   Tommi Rumps Quervain's syndrome (tenosynovitis) 01/25/2016   Primary osteoarthritis of first carpometacarpal joint of right hand 01/25/2016   Trigger finger of right thumb 01/25/2016   Essential hypertension 12/19/2015   Diastolic dysfunction 10/26/2015   Dysphagia 01/04/2015   Chest pressure 11/16/2014   Dyspnea 11/15/2014   COPD GOLD I 11/02/2014   GERD (gastroesophageal reflux  disease) 11/02/2014   Proptosis 05/31/2014   Glaucoma, bilateral 05/31/2014   Ptosis of both eyelids 05/31/2014   Xanthelasma of left upper eyelid 05/31/2014   Pain in limb 08/18/2012   Encounter for routine gynecological examination 07/24/2011   Environmental allergies 07/24/2011   Osteoporosis 07/24/2011   Nocturia 07/24/2011   Asthma 07/04/2007   PULMONARY COLLAPSE 07/04/2007   PULMONARY NODULE 07/04/2007    Orientation RESPIRATION BLADDER Height & Weight     Self, Time, Situation, Place  Normal Incontinent, External catheter Weight: 147 lb 11.3 oz (67 kg) Height:  5\' 7"  (170.2 cm)  BEHAVIORAL SYMPTOMS/MOOD NEUROLOGICAL BOWEL NUTRITION STATUS      Continent Diet  AMBULATORY STATUS COMMUNICATION OF NEEDS Skin   Limited Assist Verbally Normal                       Personal Care Assistance Level of Assistance  Bathing, Feeding, Dressing Bathing Assistance: Maximum assistance Feeding assistance: Limited assistance Dressing Assistance: Maximum assistance     Functional Limitations Info  Sight, Hearing, Speech Sight Info: Impaired (glaucoma) Hearing Info: Impaired Speech Info: Adequate    SPECIAL CARE FACTORS FREQUENCY  PT (By licensed PT), OT (By licensed OT)     PT Frequency: 5x/week OT Frequency: 5x/week            Contractures Contractures Info: Not present    Additional Factors Info  Code Status, Allergies Code Status Info: FULL Allergies Info: Other  Shellfish Allergy  Iodine  Latex  Methocarbamol  Pravastatin  Adhesive (Tape)           Current Medications (01/04/2023):  This is the current hospital active  medication list Current Facility-Administered Medications  Medication Dose Route Frequency Provider Last Rate Last Admin   0.9 %  sodium chloride infusion   Intravenous Continuous Glade Lloyd, MD 50 mL/hr at 01/04/23 1249 New Bag at 01/04/23 1249   acetaminophen (TYLENOL) tablet 650 mg  650 mg Oral Q6H PRN Charlsie Quest, MD   650 mg at  01/04/23 1438   Or   acetaminophen (TYLENOL) suppository 650 mg  650 mg Rectal Q6H PRN Charlsie Quest, MD       albuterol (PROVENTIL) (2.5 MG/3ML) 0.083% nebulizer solution 2.5 mg  2.5 mg Inhalation Q6H PRN Charlsie Quest, MD       apixaban (ELIQUIS) tablet 5 mg  5 mg Oral BID Darreld Mclean R, MD   5 mg at 01/04/23 0948   cefTRIAXone (ROCEPHIN) 1 g in sodium chloride 0.9 % 100 mL IVPB  1 g Intravenous Q24H Alekh, Kshitiz, MD 200 mL/hr at 01/04/23 1009 1 g at 01/04/23 1009   fluticasone furoate-vilanterol (BREO ELLIPTA) 100-25 MCG/ACT 1 puff  1 puff Inhalation Daily Charlsie Quest, MD   1 puff at 01/04/23 0832   gabapentin (NEURONTIN) capsule 300 mg  300 mg Oral QHS Darreld Mclean R, MD   300 mg at 01/03/23 2113   guaiFENesin-dextromethorphan (ROBITUSSIN DM) 100-10 MG/5ML syrup 10 mL  10 mL Oral Q4H PRN Glade Lloyd, MD       [START ON 01/05/2023] influenza vaccine adjuvanted (FLUAD) injection 0.5 mL  0.5 mL Intramuscular Tomorrow-1000 Charlsie Quest, MD       meclizine (ANTIVERT) tablet 12.5 mg  12.5 mg Oral TID PRN Glade Lloyd, MD   12.5 mg at 01/04/23 1438   menthol-cetylpyridinium (CEPACOL) lozenge 3 mg  1 lozenge Oral PRN Howerter, Justin B, DO   3 mg at 01/04/23 0508   metoprolol succinate (TOPROL-XL) 24 hr tablet 25 mg  25 mg Oral Daily Darreld Mclean R, MD   25 mg at 01/04/23 0948   ofloxacin (OCUFLOX) 0.3 % ophthalmic solution 5 drop  5 drop Right EAR Daily Darreld Mclean R, MD   5 drop at 01/04/23 1247   ondansetron (ZOFRAN) tablet 4 mg  4 mg Oral Q6H PRN Charlsie Quest, MD       Or   ondansetron (ZOFRAN) injection 4 mg  4 mg Intravenous Q6H PRN Darreld Mclean R, MD       rosuvastatin (CRESTOR) tablet 20 mg  20 mg Oral Daily Darreld Mclean R, MD   20 mg at 01/04/23 0948   senna-docusate (Senokot-S) tablet 1 tablet  1 tablet Oral QHS PRN Darreld Mclean R, MD       sodium chloride flush (NS) 0.9 % injection 3 mL  3 mL Intravenous Q12H Darreld Mclean R, MD   3 mL at 01/03/23 2113    Facility-Administered Medications Ordered in Other Encounters  Medication Dose Route Frequency Provider Last Rate Last Admin   regadenoson (LEXISCAN) injection SOLN 0.4 mg  0.4 mg Intravenous Once Quintella Reichert, MD       regadenoson (LEXISCAN) injection SOLN 0.4 mg  0.4 mg Intravenous Once Little Ishikawa, MD       technetium tetrofosmin (TC-MYOVIEW) injection 32.3 millicurie  32.3 millicurie Intravenous Once PRN Little Ishikawa, MD         Discharge Medications: Please see discharge summary for a list of discharge medications.  Relevant Imaging Results:  Relevant Lab Results:   Additional Information SSN:[id number],   Covid+ 01/04/23  Shawnika Pepin A [place], LCSWA

## 2023-01-04 NOTE — Evaluation (Signed)
Physical Therapy Evaluation Patient Details Name: Anne [name] MRN: [id number] DOB: 02-05-1931 Today's Date: 01/04/2023  History of Present Illness  89 yo female admitted 10/3 after fall out of bed. PMHx: PAF on Eliquis, asthma, HTN, HLD, glaucoma  Clinical Impression  Pt pleasant, HOH with delayed processing, posterior bias, min assist for basic transfers and orthostatic hypotension. Pt lives at home alone and have family who can stay 24hr if pt at [facility] level or better. Pt with decreased transfers, function and mobility who will benefit from acute therapy to maximize mobility, safety and independence and decrease fall risk.   Orthostatic BPs  Supine 147/57, HR 87  Sitting 101/68, HR90     Standing 125/35, HR 100  Standing after 2 min  Seated in chair end of session 81/69, HR 89  105/43, HR 80          If plan is discharge home, recommend the following: A little help with walking and/or transfers;A lot of help with bathing/dressing/bathroom;Assistance with cooking/housework;Direct supervision/assist for medications management;Assist for transportation;Supervision due to cognitive status   Can travel by private vehicle   No    Equipment Recommendations Rolling walker (2 wheels);BSC/3in1  Recommendations for Other Services  OT consult    Functional Status Assessment Patient has had a recent decline in their functional status and demonstrates the ability to make significant improvements in function in a reasonable and predictable amount of time.     Precautions / Restrictions Precautions Precautions: Fall;Other (comment) Precaution Comments: watch bp, posterior bias      Mobility  Bed Mobility Overal bed mobility: Needs Assistance Bed Mobility: Supine to Sit     Supine to sit: Supervision, HOB elevated     General bed mobility comments: HOB 20 degrees, cues to initiate and scoot fully to EOB. pt able to scoot along side of bed in sitting without assist     Transfers Overall transfer level: Needs assistance   Transfers: Sit to/from Stand, Bed to chair/wheelchair/BSC Sit to Stand: Min assist           General transfer comment: min assist to rise from bed x 4 trials with noted orthostatic hypotension. mod assist to pivot from bed to chair with max cues and face to face technique. Pt with significant posterior bias throughout mobility requiring physical assist to stand    Ambulation/Gait               General Gait Details: unable  Stairs            Wheelchair Mobility     Tilt Bed    Modified Rankin (Stroke Patients Only)       Balance Overall balance assessment: Needs assistance, History of Falls Sitting-balance support: No upper extremity supported, Feet supported Sitting balance-Leahy Scale: Fair Sitting balance - Comments: able to static sit without support   Standing balance support: No upper extremity supported, Bilateral upper extremity supported Standing balance-Leahy Scale: Poor Standing balance comment: posterior bias with and without RW present requiring physical assist at all times                             Pertinent Vitals/Pain Pain Assessment Pain Assessment: No/denies pain    Home Living Family/patient expects to be discharged to:: Private residence Living Arrangements: Alone Available Help at Discharge: Family;Available 24 hours/day Type of Home: House Home Access: Stairs to enter Entrance Stairs-Rails: Right Entrance Stairs-Number of Steps: 3   Home Layout:  One level Home Equipment: None Additional Comments: pt does report 1 other fall in the last 6 months    Prior Function Prior Level of Function : Driving;Independent/Modified Independent             Mobility Comments: pt was independent and driving ADLs Comments: family assists with heavy chores     Extremity/Trunk Assessment   Upper Extremity Assessment Upper Extremity Assessment: Generalized weakness     Lower Extremity Assessment Lower Extremity Assessment: Generalized weakness    Cervical / Trunk Assessment Cervical / Trunk Assessment: Normal  Communication   Communication Communication: Hearing impairment  Cognition Arousal: Alert Behavior During Therapy: Flat affect Overall Cognitive Status: Impaired/Different from baseline Area of Impairment: Problem solving, Safety/judgement                         Safety/Judgement: Decreased awareness of deficits, Decreased awareness of safety   Problem Solving: Slow processing General Comments: pt hOH but processing slower than normal with niece present to confirm, pt with lack of awareness of deficits and LOB        General Comments      Exercises     Assessment/Plan    PT Assessment Patient needs continued PT services  PT Problem List Decreased mobility;Decreased activity tolerance;Decreased balance;Decreased knowledge of use of DME;Decreased coordination;Decreased cognition       PT Treatment Interventions DME instruction;Gait training;Functional mobility training;Therapeutic activities;Stair training;Balance training;Therapeutic exercise;Patient/family education;Cognitive remediation;Neuromuscular re-education    PT Goals (Current goals can be found in the Care Plan section)  Acute Rehab PT Goals Patient Stated Goal: return home and play bridge PT Goal Formulation: With patient/family Time For Goal Achievement: 01/18/23 Potential to Achieve Goals: Good    Frequency Min 1X/week     Co-evaluation               AM-PAC PT "6 Clicks" Mobility  Outcome Measure Help needed turning from your back to your side while in a flat bed without using bedrails?: A Little Help needed moving from lying on your back to sitting on the side of a flat bed without using bedrails?: A Little Help needed moving to and from a bed to a chair (including a wheelchair)?: A Little Help needed standing up from a chair using your  arms (e.g., wheelchair or bedside chair)?: A Little Help needed to walk in hospital room?: Total Help needed climbing 3-5 steps with a railing? : Total 6 Click Score: 14    End of Session   Activity Tolerance: Patient tolerated treatment well Patient left: in chair;with call bell/phone within reach;with family/visitor present;with chair alarm set Nurse Communication: Mobility status PT Visit Diagnosis: Other abnormalities of gait and mobility (R26.89);History of falling (Z91.81);Muscle weakness (generalized) (M62.81)    Time: 2956-2130 PT Time Calculation (min) (ACUTE ONLY): 29 min   Charges:   PT Evaluation $PT Eval Moderate Complexity: 1 Mod PT Treatments $Therapeutic Activity: 8-22 mins PT General Charges $$ ACUTE PT VISIT: 1 Visit         Merryl Hacker, PT Acute Rehabilitation Services Office: [phone]   Enedina Finner Bashir Marchetti 01/04/2023, 9:50 AM

## 2023-01-04 NOTE — Progress Notes (Signed)
PROGRESS NOTE    Curran Potenza  ZOX:[id number] DOB: 1930-09-02 DOA: 01/03/2023 PCP: Genelle Gather, DO   Brief Narrative:  89 y.o. female with medical history significant for PAF on Eliquis, asthma, HTN, HLD presented with lightheadedness, loss of balance and fall to the floor without loss of consciousness.  She apparently was on the floor for about 2 hours before she called family for assistance.  Agitation, temperature was 100.4 rectally.  WBC of 6.  Lactic acid of 1.2 then 4.4.  UA with more than 50 RBCs but negative nitrites/leukocytes and no pyuria.  Portable chest x-ray was negative for focal consolidation, edema or effusion.  CT of the head without contrast was negative for any acute intracranial injury.  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  She was started on IV fluids.  Assessment & Plan:   Fever -Questionable cause.  Temperature max of 102.3 overnight.  Follow cultures.  Chest x-ray on presentation did not show any focal airspace opacity.  Will check COVID and respiratory virus panel. -Patient received Rocephin on presentation.  Continue Rocephin for now.  Lactic acidosis -Resolved  Hypotension -Resolved.  Blood pressure intermittently on the higher side currently.  Monitor.  Near syncope -Questionable cause.  May be orthostatic in nature.  Check orthostatic vitals.  Treated with IV fluids. -Continue telemetry monitoring  Hematuria -UA showed more than 50 RBCs but negative for pyuria.  No gross hematuria.  Patient has history of chronic hematuria.  CT of abdomen and pelvis negative for acute process.  Outpatient follow-up with urology  Paroxysmal A-fib -Currently rate controlled.  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  Communicated with Dr. Unknown Jim surgery via epic chat: He will see the patient in consultation  Hypokalemia -Replace.  Repeat a.m. labs  Hypomagnesemia -Replace.  Repeat a.m. labs.  Hypertension Hyperlipidemia -Continue Toprol-XL.  Blood pressure on the higher side currently.  Might have to resume amlodipine as well. -Continue statin  Asthma -Stable.  Continue current inhaled regimen.  Physical deconditioning -PT eval  DVT prophylaxis: Eliquis Code Status: Full Family Communication: Niece at bedside Disposition Plan: Status is: Observation The patient will require care spanning > 2 midnights and should be moved to inpatient because: Of severity of illness.  Monitor temperature curve.  Need for IV antibiotics.  Consultants: Vascular surgery  Procedures: None  Antimicrobials:  Rocephin from 01/03/2023 onwards  Subjective: Patient seen and examined at bedside.  Complains of sore throat and fever.  Feels weak.  Denies worsening abdominal pain or vomiting.  Objective: Vitals:   01/04/23 0525 01/04/23 0559 01/04/23 0756 01/04/23 0832  BP: (!) 169/79 (!) 177/64 (!) 133/58   Pulse: 98 96 84   Resp: 16 16 18    Temp: (!) 100.9 F (38.3 C) (!) 102.3 F (39.1 C) (!) 100.8 F (38.2 C)   TempSrc: Oral Oral    SpO2: (!) 89% 100% 94% 95%  Weight:      Height:        Intake/Output Summary (Last 24 hours) at 01/04/2023 1015 Last data filed at 01/04/2023 0600 Gross per 24 hour  Intake 778.89 ml  Output 1550 ml  Net -771.11 ml   [employer]  01/03/23 1037  Weight: 67 kg    Examination:  General exam: Appears calm and comfortable.  On room air.  Elderly female lying in bed. Respiratory system: Bilateral decreased breath sounds at bases with scattered crackles Cardiovascular system: S1 & S2 heard, Rate controlled Gastrointestinal system: Abdomen is nondistended, soft and mildly tender in  the suprapubic region.  Normal bowel sounds heard. Extremities: No cyanosis, clubbing, edema  Central nervous system: Awake, slow to respond.  No focal neurological deficits. Moving extremities Skin: No rashes, lesions or ulcers Psychiatry: Flat affect.  Not agitated..     Data Reviewed: I have personally reviewed following labs and imaging studies  CBC: Recent Labs  Lab 01/03/23 1055 01/04/23 0604  WBC 6.0 6.2  NEUTROABS 3.7  --   HGB 10.9* 12.3  HCT 33.6* 37.8  MCV 91.1 90.0  PLT 237 241   Basic Metabolic Panel: Recent Labs  Lab 01/03/23 1055 01/04/23 0604 01/04/23 0818  NA 135 136  --   K 3.5 3.1*  --   CL 103 99  --   CO2 23 24  --   GLUCOSE 96 120*  --   BUN 13 8  --   CREATININE 0.97 1.08*  --   CALCIUM 8.9 8.7*  --   MG  --   --  1.6*   GFR: Estimated Creatinine Clearance: 33.7 mL/min (A) (by C-G formula based on SCr of 1.08 mg/dL (H)). Liver Function Tests: Recent Labs  Lab 01/03/23 1055  AST 43*  ALT 26  ALKPHOS 51  BILITOT 0.5  PROT 6.5  ALBUMIN 3.7   No results for input(s): "LIPASE", "AMYLASE" in the last 168 hours. No results for input(s): "AMMONIA" in the last 168 hours. Coagulation Profile: Recent Labs  Lab 01/03/23 1055  INR 1.2   Cardiac Enzymes: No results for input(s): "CKTOTAL", "CKMB", "CKMBINDEX", "TROPONINI" in the last 168 hours. BNP (last 3 results) No results for input(s): "PROBNP" in the last 8760 hours. HbA1C: No results for input(s): "HGBA1C" in the last 72 hours. CBG: No results for input(s): "GLUCAP" in the last 168 hours. Lipid Profile: No results for input(s): "CHOL", "HDL", "LDLCALC", "TRIG", "CHOLHDL", "LDLDIRECT" in the last 72 hours. Thyroid Function Tests: No results for input(s): "TSH", "T4TOTAL", "FREET4", "T3FREE", "THYROIDAB" in the last 72 hours. Anemia Panel: No results for input(s): "VITAMINB12", "FOLATE", "FERRITIN", "TIBC", "IRON", "RETICCTPCT" in the last 72 hours. Sepsis Labs: Recent Labs  Lab  01/03/23 1123 01/03/23 1313 01/03/23 1839 01/04/23 0818  LATICACIDVEN 1.2 4.4* 0.6 1.0    Recent Results (from the past 240 hour(s))  Blood Culture (routine x 2)     Status: None (Preliminary result)   Collection Time: 01/03/23 11:10 AM   Specimen: BLOOD RIGHT FOREARM  Result Value Ref Range Status   Specimen Description BLOOD RIGHT FOREARM  Final   Special Requests   Final    BOTTLES DRAWN AEROBIC AND ANAEROBIC Blood Culture adequate volume   Culture   Final    NO GROWTH < 24 HOURS Performed at [facility], [address], [place], [place] [address]    Report Status PENDING  Incomplete  Blood Culture (routine x 2)     Status: None (Preliminary result)   Collection Time: 01/03/23 11:15 AM   Specimen: BLOOD RIGHT HAND  Result Value Ref Range Status   Specimen Description BLOOD RIGHT HAND  Final   Special Requests   Final    BOTTLES DRAWN AEROBIC AND ANAEROBIC Blood Culture adequate volume  Culture   Final    NO GROWTH < 24 HOURS Performed at [facility], [address] [place], [place] [address]    Report Status PENDING  Incomplete         Radiology Studies: DG Chest Port 1 View  Result Date: 01/04/2023 CLINICAL DATA:  [id number] Fever [id number] EXAM: PORTABLE CHEST 1 VIEW COMPARISON:  CXR 01/03/23 FINDINGS: No pleural effusion. No pneumothorax. Normal cardiac and mediastinal contours. No focal airspace opacity. No radiographically apparent displaced rib fractures. Visualized upper abdomen is unremarkable. IMPRESSION: No focal airspace opacity Electronically Signed   By: Lorenza Cambridge M.D.   On: 01/04/2023 08:15   CT ABDOMEN PELVIS WO CONTRAST  Result Date: 01/03/2023 CLINICAL DATA:  Acute, non localized abdominal pain. EXAM: CT ABDOMEN AND PELVIS WITHOUT CONTRAST TECHNIQUE: Multidetector CT imaging of the abdomen and pelvis was performed following the standard protocol without IV contrast. RADIATION DOSE REDUCTION: This exam was performed according to the  departmental dose-optimization program which includes automated exposure control, adjustment of the mA and/or kV according to patient size and/or use of iterative reconstruction technique. COMPARISON:  07/11/2007.  Chest CTA dated 05/06/2021. FINDINGS: Lower chest: Borderline enlarged heart. Small hiatal hernia. Mild linear scarring/atelectasis at both lung bases. Hepatobiliary: No focal liver abnormality is seen. No gallstones, gallbladder wall thickening, or biliary dilatation. Pancreas: Unremarkable. No pancreatic ductal dilatation or surrounding inflammatory changes. Spleen: Normal in size without focal abnormality. Adrenals/Urinary Tract: Normal appearing adrenal glands with the exception of a small amount of calcification again demonstrated in the right adrenal gland. A simple appearing left renal cyst is again demonstrated with a mean density of -2.8 Hounsfield units. Normal-appearing right kidney, ureters and urinary bladder. Stomach/Bowel: Small hiatal hernia. Prominent stool in the right and transverse colon. Normal-appearing appendix. Unremarkable small bowel. Vascular/Lymphatic: Atheromatous arterial calcifications. These include dense calcifications at the origins of the celiac axis and superior mesenteric artery with approximately 80% stenosis of the origin of the celiac axis and proximally 60% stenosis of the origin of the superior mesenteric artery. No enlarged lymph nodes. Reproductive: Status post hysterectomy. No adnexal masses. Other: No abdominal wall hernia or abnormality. No abdominopelvic ascites. Musculoskeletal: Lumbar and lower thoracic spine degenerative changes. IMPRESSION: 1. No acute abnormality. 2. Prominent stool in the right and transverse colon. 3. Small hiatal hernia. 4. Borderline cardiomegaly. 5. 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 Electronically  Signed   By: Beckie Salts M.D.   On: 01/03/2023 17:42   MM 3D SCREENING MAMMOGRAM BILATERAL BREAST  Result Date: 01/03/2023 CLINICAL DATA:  Screening. EXAM: DIGITAL SCREENING BILATERAL MAMMOGRAM WITH TOMOSYNTHESIS AND CAD TECHNIQUE: Bilateral screening digital craniocaudal and mediolateral oblique mammograms were obtained. Bilateral screening digital breast tomosynthesis was performed. The images were evaluated with computer-aided detection. COMPARISON:  Previous exam(s). ACR Breast Density Category b: There are scattered areas of fibroglandular density. FINDINGS: There are no findings suspicious for malignancy. IMPRESSION: No mammographic evidence of malignancy. A result letter of this screening mammogram will be mailed directly to the patient. RECOMMENDATION: Screening mammogram in one year. (Code:SM-B-01Y) BI-RADS CATEGORY  1: Negative. Electronically Signed   By: Sherron Ales M.D.   On: 01/03/2023 17:07   DG Chest Port 1 View  Result Date: 01/03/2023 CLINICAL DATA:  Dizziness. EXAM: PORTABLE CHEST 1 VIEW COMPARISON:  Chest radiograph dated July 22, 2022. FINDINGS: The heart size and mediastinal contours are within  normal limits. Aortic calcification. No focal consolidation, pneumothorax, or sizable pleural effusion. No acute osseous abnormality. IMPRESSION: No active cardiopulmonary disease. Electronically Signed   By: Hart Robinsons M.D.   On: 01/03/2023 13:42   CT Head Wo Contrast  Result Date: 01/03/2023 CLINICAL DATA:  Minor head trauma EXAM: CT HEAD WITHOUT CONTRAST TECHNIQUE: Contiguous axial images were obtained from the base of the skull through the vertex without intravenous contrast. RADIATION DOSE REDUCTION: This exam was performed according to the departmental dose-optimization program which includes automated exposure control, adjustment of the mA and/or kV according to patient size and/or use of iterative reconstruction technique. COMPARISON:  05/31/2019 FINDINGS: Brain: No evidence  of acute infarction, hemorrhage, hydrocephalus, extra-axial collection or mass lesion/mass effect. Generalized brain atrophy that is mild for age. Age normal white matter appearance Vascular: No hyperdense vessel or unexpected calcification. Skull: No acute or aggressive finding Sinuses/Orbits: Chronic sinusitis with generalized mucosal thickening. Prior endoscopic sinus surgery.No acute finding. IMPRESSION: No evidence of intracranial injury. Electronically Signed   By: Tiburcio Pea M.D.   On: 01/03/2023 13:19        Scheduled Meds:  apixaban  5 mg Oral BID   fluticasone furoate-vilanterol  1 puff Inhalation Daily   gabapentin  300 mg Oral QHS   [START ON 01/05/2023] influenza vaccine adjuvanted  0.5 mL Intramuscular Tomorrow-1000   metoprolol succinate  25 mg Oral Daily   ofloxacin  5 drop Right EAR Daily   potassium chloride  40 mEq Oral Q4H   rosuvastatin  20 mg Oral Daily   sodium chloride flush  3 mL Intravenous Q12H   Continuous Infusions:  cefTRIAXone (ROCEPHIN)  IV 1 g (01/04/23 1009)          Glade Lloyd, MD [facility] 01/04/2023, 10:15 AM

## 2023-01-04 NOTE — TOC Initial Note (Signed)
Transition of Care [facility]) - Initial/Assessment Note    Patient Details  Name: Anne [name] MRN: [id number] Date of Birth: Feb 24, 1931  [facility] CM/SW Contact:    Anne [name], Anne [name] Phone Number: 01/04/2023, 10:46 AM  Clinical Narrative:                  CSW met with pt at bedside. Pt was agreeable to SNF. CSW explained SNF process, pt stated that she was familiar.  CSW also spoke with sister, Anne [name], regarding referral to SNF. She stated that pt had been before and was open to referral but did not want [facility].   SNF workup completed. Bed offers pending.   TOC will continue to follow.   Expected Discharge Plan: Skilled Nursing Facility Barriers to Discharge: Continued Medical Work up, English as a second language teacher, SNF Pending bed offer   Patient Goals and CMS Choice            Expected Discharge Plan and Services In-house Referral: Clinical Social Work     Living arrangements for the past 2 months: Single Family Home                                      Prior Living Arrangements/Services Living arrangements for the past 2 months: Single Family Home Lives with:: Self          Need for Family Participation in Patient Care: Yes (Comment) Care giver support system in place?: Yes (comment)      Activities of Daily Living   ADL Screening (condition at time of admission) Independently performs ADLs?: Yes (appropriate for developmental age) Is the patient deaf or have difficulty hearing?: Yes Does the patient have difficulty seeing, even when wearing glasses/contacts?: No Does the patient have difficulty concentrating, remembering, or making decisions?: No  Permission Sought/Granted                  Emotional Assessment Appearance:: Appears stated age Attitude/Demeanor/Rapport: Unable to Assess Affect (typically observed): Unable to Assess Orientation: : Oriented to Self, Oriented to Place, Oriented to  Time, Oriented to  Situation Alcohol / Substance Use: Not Applicable Psych Involvement: No (comment)  Admission diagnosis:  General weakness [R53.1] SIRS (systemic inflammatory response syndrome) (HCC) [R65.10] Acute cystitis with hematuria [N30.01] Near syncope [R55] Patient Active Problem List   Diagnosis Date Noted   Lactic acidosis 01/03/2023   Hematuria 01/03/2023   PAF (paroxysmal atrial fibrillation) (HCC) 07/31/2022   Hypercoagulable state due to paroxysmal atrial fibrillation (HCC) 07/31/2022   Shellfish allergy 03/08/2022   PVC's (premature ventricular contractions) symptomatic 02/21/2021   Sinus bradycardia 02/21/2021   Near syncope 02/20/2021   Arthritis 04/08/2018   Glaucoma 04/08/2018   Herpes simplex type 2 infection 04/08/2018   Uterine leiomyoma 04/08/2018   Non-recurrent acute suppurative otitis media of left ear with spontaneous rupture of tympanic membrane 01/10/2018   Pain 05/29/2017   Throat pain in adult 05/28/2017   Thrush of mouth and esophagus (HCC) 05/28/2017   Acute bacterial sinusitis 10/10/2016   De Quervain's syndrome (tenosynovitis) 01/25/2016   Primary osteoarthritis of first carpometacarpal joint of right hand 01/25/2016   Trigger finger of right thumb 01/25/2016   Essential hypertension 12/19/2015   Diastolic dysfunction 10/26/2015   Dysphagia 01/04/2015   Chest pressure 11/16/2014   Dyspnea 11/15/2014   COPD GOLD I 11/02/2014   GERD (gastroesophageal reflux disease) 11/02/2014   Proptosis 05/31/2014  Glaucoma, bilateral 05/31/2014   Ptosis of both eyelids 05/31/2014   Xanthelasma of left upper eyelid 05/31/2014   Pain in limb 08/18/2012   Encounter for routine gynecological examination 07/24/2011   Environmental allergies 07/24/2011   Osteoporosis 07/24/2011   Nocturia 07/24/2011   Asthma 07/04/2007   PULMONARY COLLAPSE 07/04/2007   PULMONARY NODULE 07/04/2007   PCP:  Genelle Gather, DO Pharmacy:   [facility] [phone] [place], [place] - [address] [place] [address] Phone: [phone] Fax: [phone]  [facility]) - [place], [place] - [address] [facility] [address] [place] [address] [place]  [address] Phone: [phone] Fax: [phone]  [facility] [address] [place] [address] Phone: [phone] Fax: [phone]     Social Determinants of Health (SDOH) Social History: SDOH Screenings   Food Insecurity: No Food Insecurity (01/03/2023)  Housing: Low Risk  (01/03/2023)  Transportation Needs: No Transportation Needs (01/03/2023)  Utilities: Not At Risk (01/03/2023)  Tobacco Use: Medium Risk (01/03/2023)   SDOH Interventions:     Readmission Risk Interventions     No data to display

## 2023-01-04 NOTE — Plan of Care (Signed)

## 2023-01-04 NOTE — Evaluation (Signed)
Occupational Therapy Evaluation Patient Details Name: Anne [name] MRN: [id number] DOB: [birth year] Today's Date: 01/04/2023   History of Present Illness 89 yo female admitted 10/3 after fall out of bed. PMHx: PAF on Eliquis, asthma, HTN, HLD, glaucoma   Clinical Impression   Pt was living alone, driving, getting her own groceries and her 78 year old sister's and independent in ADLs. Her family helped with heavy housework. Pt is HOH, difficulty accurately assessing cognition, but she is unaware of her balance deficits and posterior lean in standing. Pt overall requires up to min assist with RW to stand and transfer and set up to min assist for ADLs. Patient will benefit from continued inpatient follow up therapy, <3 hours/day.       If plan is discharge home, recommend the following: A little help with walking and/or transfers;A little help with bathing/dressing/bathroom;Assistance with cooking/housework;Assist for transportation;Help with stairs or ramp for entrance    Functional Status Assessment  Patient has had a recent decline in their functional status and demonstrates the ability to make significant improvements in function in a reasonable and predictable amount of time.  Equipment Recommendations  BSC/3in1    Recommendations for Other Services       Precautions / Restrictions Precautions Precautions: Fall;Other (comment) Precaution Comments: watch bp, posterior bias      Mobility Bed Mobility               General bed mobility comments: received and returned to chair    Transfers Overall transfer level: Needs assistance Equipment used: Rolling walker (2 wheels) Transfers: Sit to/from Stand Sit to Stand: Min assist     Step pivot transfers: Min assist     General transfer comment: assist due to posterior bias, cues for hand placement      Balance Overall balance assessment: Needs assistance, History of Falls   Sitting balance-Leahy Scale:  Good Sitting balance - Comments: no LOB donning socks   Standing balance support: Bilateral upper extremity supported, No upper extremity supported Standing balance-Leahy Scale: Poor Standing balance comment: min assist for balance with and without RW                           ADL either performed or assessed with clinical judgement   ADL Overall ADL's : Needs assistance/impaired Eating/Feeding: Independent;Sitting   Grooming: Set up;Sitting;Wash/dry face   Upper Body Bathing: Set up;Sitting   Lower Body Bathing: Minimal assistance;Sit to/from stand;Sitting/lateral leans   Upper Body Dressing : Set up;Sitting   Lower Body Dressing: Minimal assistance;Sit to/from stand Lower Body Dressing Details (indicate cue type and reason): can don socks, needs assist for balance with pulling up pants Toilet Transfer: Minimal assistance;Stand-pivot;Rolling walker (2 wheels);BSC/3in1   Toileting- Clothing Manipulation and Hygiene: Minimal assistance;Sit to/from stand               Vision Baseline Vision/History: 1 Wears glasses Ability to See in Adequate Light: 0 Adequate Patient Visual Report: No change from baseline Additional Comments: niece questions if she consistently wears her glasses when she drives     Perception         Praxis         Pertinent Vitals/Pain Pain Assessment Pain Assessment: No/denies pain     Extremity/Trunk Assessment Upper Extremity Assessment Upper Extremity Assessment: Overall WFL for tasks assessed   Lower Extremity Assessment Lower Extremity Assessment: Defer to PT evaluation   Cervical / Trunk Assessment Cervical / Trunk Assessment:  Normal   Communication Communication Communication: Hearing impairment   Cognition Arousal: Alert Behavior During Therapy: WFL for tasks assessed/performed Overall Cognitive Status: Impaired/Different from baseline Area of Impairment: Safety/judgement                          Safety/Judgement: Decreased awareness of deficits     General Comments: pt lacks awareness of balance deficits and posterior lean     General Comments       Exercises     Shoulder Instructions      Home Living Family/patient expects to be discharged to:: Private residence Living Arrangements: Alone Available Help at Discharge: Family;Available 24 hours/day Type of Home: House Home Access: Stairs to enter [employer] of Steps: 3 Entrance Stairs-Rails: Right Home Layout: One level     Bathroom Shower/Tub: Producer, television/film/video: Standard     Home Equipment: None   Additional Comments: sponge bathes, reports her shower leaks      Prior Functioning/Environment Prior Level of Function : Driving;Independent/Modified Independent             Mobility Comments: pt was independent and driving ADLs Comments: family assists with heavy chores, pt gets groceries for her 3 year old sister        OT Problem List: Impaired balance (sitting and/or standing);Decreased safety awareness;Decreased knowledge of use of DME or AE      OT Treatment/Interventions: Self-care/ADL training;DME and/or AE instruction;Therapeutic activities;Cognitive remediation/compensation;Balance training    OT Goals(Current goals can be found in the care plan section) Acute Rehab OT Goals OT Goal Formulation: With patient/family Time For Goal Achievement: 01/18/23 Potential to Achieve Goals: Good  OT Frequency: Min 1X/week    Co-evaluation              AM-PAC OT "6 Clicks" Daily Activity     Outcome Measure Help from another person eating meals?: None Help from another person taking care of personal grooming?: A Little Help from another person toileting, which includes using toliet, bedpan, or urinal?: A Little Help from another person bathing (including washing, rinsing, drying)?: A Little Help from another person to put on and taking off regular upper body  clothing?: A Little Help from another person to put on and taking off regular lower body clothing?: A Little 6 Click Score: 19   End of Session Equipment Utilized During Treatment: Gait belt;Rolling walker (2 wheels)  Activity Tolerance: Patient tolerated treatment well Patient left: in chair;with call bell/phone within reach;with chair alarm set;with family/visitor present  OT Visit Diagnosis: Unsteadiness on feet (R26.81);Other abnormalities of gait and mobility (R26.89);Other symptoms and signs involving cognitive function                Time: 1131-1204 OT Time Calculation (min): 33 min Charges:  OT General Charges $OT Visit: 1 Visit OT Evaluation $OT Eval Moderate Complexity: 1 Mod OT Treatments $Self Care/Home Management : 8-22 mins  Berna Spare, OTR/L Acute Rehabilitation Services Office: [phone]   Evern Bio 01/04/2023, 12:33 PM

## 2023-01-04 NOTE — Progress Notes (Signed)
Pt stated she did not need a breathing tx at this time. Pt made aware to let us know if she did. Pt is in no distress at this time. Rt will continue to monitor.

## 2023-01-05 DIAGNOSIS — R55 Syncope and collapse: Secondary | ICD-10-CM | POA: Diagnosis not present

## 2023-01-05 LAB — CBC WITH DIFFERENTIAL/PLATELET
Abs Immature Granulocytes: 0.01 10*3/uL (ref 0.00–0.07)
Basophils Absolute: 0 10*3/uL (ref 0.0–0.1)
Basophils Relative: 1 %
Eosinophils Absolute: 0 10*3/uL (ref 0.0–0.5)
Eosinophils Relative: 0 %
HCT: 32.3 % — ABNORMAL LOW (ref 36.0–46.0)
Hemoglobin: 10.7 g/dL — ABNORMAL LOW (ref 12.0–15.0)
Immature Granulocytes: 0 %
Lymphocytes Relative: 45 %
Lymphs Abs: 2 10*3/uL (ref 0.7–4.0)
MCH: 29.8 pg (ref 26.0–34.0)
MCHC: 33.1 g/dL (ref 30.0–36.0)
MCV: 90 fL (ref 80.0–100.0)
Monocytes Absolute: 0.7 10*3/uL (ref 0.1–1.0)
Monocytes Relative: 16 %
Neutro Abs: 1.7 10*3/uL (ref 1.7–7.7)
Neutrophils Relative %: 38 %
Platelets: 211 10*3/uL (ref 150–400)
RBC: 3.59 MIL/uL — ABNORMAL LOW (ref 3.87–5.11)
RDW: 13.6 % (ref 11.5–15.5)
WBC: 4.4 10*3/uL (ref 4.0–10.5)
nRBC: 0 % (ref 0.0–0.2)

## 2023-01-05 LAB — COMPREHENSIVE METABOLIC PANEL
ALT: 36 U/L (ref 0–44)
AST: 67 U/L — ABNORMAL HIGH (ref 15–41)
Albumin: 2.8 g/dL — ABNORMAL LOW (ref 3.5–5.0)
Alkaline Phosphatase: 42 U/L (ref 38–126)
Anion gap: 8 (ref 5–15)
BUN: 13 mg/dL (ref 8–23)
CO2: 22 mmol/L (ref 22–32)
Calcium: 8.1 mg/dL — ABNORMAL LOW (ref 8.9–10.3)
Chloride: 107 mmol/L (ref 98–111)
Creatinine, Ser: 1.1 mg/dL — ABNORMAL HIGH (ref 0.44–1.00)
GFR, Estimated: 48 mL/min — ABNORMAL LOW (ref >60)
Glucose, Bld: 104 mg/dL — ABNORMAL HIGH (ref 70–99)
Potassium: 3.7 mmol/L (ref 3.5–5.1)
Sodium: 137 mmol/L (ref 135–145)
Total Bilirubin: 0.3 mg/dL (ref 0.3–1.2)
Total Protein: 5.8 g/dL — ABNORMAL LOW (ref 6.5–8.1)

## 2023-01-05 LAB — MAGNESIUM: Magnesium: 2.1 mg/dL (ref 1.7–2.4)

## 2023-01-05 LAB — FERRITIN: Ferritin: 122 ng/mL (ref 11–307)

## 2023-01-05 LAB — D-DIMER, QUANTITATIVE: D-Dimer, Quant: 0.46 ug{FEU}/mL (ref 0.00–0.50)

## 2023-01-05 LAB — C-REACTIVE PROTEIN: CRP: 12.4 mg/dL — ABNORMAL HIGH (ref <1.0)

## 2023-01-05 MED ORDER — METHYLPREDNISOLONE SODIUM SUCC 40 MG IJ SOLR
40.0000 mg | INTRAMUSCULAR | Status: DC
  Administered 2023-01-05 – 2023-01-07 (×3): 40 mg via INTRAVENOUS
  Filled 2023-01-05 (×3): qty 1

## 2023-01-05 NOTE — Plan of Care (Signed)

## 2023-01-05 NOTE — Progress Notes (Signed)
PROGRESS NOTE    Anne [name]  ZOX:[id number] DOB: [birth year] DOA: 01/03/2023 PCP: Genelle Gather, DO   Brief Narrative:  89 y.o. female with medical history significant for PAF on Eliquis, asthma, HTN, HLD presented with lightheadedness, loss of balance and fall to the floor without loss of consciousness.  She apparently was on the floor for about 2 hours before she called family for assistance.  Agitation, temperature was 100.4 rectally.  WBC of 6.  Lactic acid of 1.2 then 4.4.  UA with more than 50 RBCs but negative nitrites/leukocytes and no pyuria.  Portable chest x-ray was negative for focal consolidation, edema or effusion.  CT of the head without contrast was negative for any acute intracranial injury.  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  She was started on IV fluids.  Assessment & Plan:   COVID-19 infection -Tested positive for COVID-19 infection on 01/04/2023 after she was spiking temperatures.  Chest x-ray on presentation did not show any focal airspace opacity.   -DC Rocephin.  No signs of any bacterial infection.  Monitor respiratory status.  Currently on room air.  Complains of intermittent cough.  CRP elevated on 01/04/2023.  Start Paxlovid and Solu-Medrol. -Temperature max of 100.8 over the last 24 hours.  Lactic acidosis -Resolved  Hypotension -Resolved.  Blood pressure intermittently on the higher side currently.  Monitor.  Near syncope -Questionable cause.  May be orthostatic in nature.  Monitor orthostatic vitals.  Treated with IV fluids.  DC IV fluids.  Encourage oral intake. -Continue telemetry monitoring  Hematuria -UA showed more than 50 RBCs but negative for pyuria.  No gross hematuria.  Patient has history of chronic hematuria.  CT of abdomen and pelvis negative for acute process.  Outpatient follow-up with  urology  Paroxysmal A-fib -Currently rate controlled.  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  Vascular surgery evaluation appreciated: Outpatient follow-up with vascular surgery in 3 to 6 months with mesenteric artery duplex  Hypokalemia -Improved  Hypomagnesemia -Improved  Hypertension Hyperlipidemia -Continue Toprol-XL.  Blood pressure on the higher side currently.  Might have to resume amlodipine as well. -Continue statin  Asthma -Stable.  Continue current inhaled regimen.  Physical deconditioning -PT recommended SNF placement.  TOC consulted.  Goals of care -Currently listed as full code.  Palliative care consulted for goals of care discussion.  DVT prophylaxis: Eliquis Code Status: Full Family Communication: Niece at bedside on 01/04/2023 Disposition Plan: Status is: inpatient because: Of severity of illness.  Need for SNF placement Consultants: Vascular surgery  Procedures: None  Antimicrobials:  Rocephin from 01/03/2023 onwards  Subjective: Patient seen and examined at bedside.  Had fever yesterday.  Still complains of sore throat.  Feels weak.  No chest pain, worsening shortness of breath reported. Objective: Vitals:   01/04/23 1437 01/04/23 2144 01/05/23 0408 01/05/23 0548  BP: (!) 150/67 (!) 161/66 (!) 137/50   Pulse: 77 76 60   Resp: 18 17 16    Temp: 99.4 F (37.4 C) (!) 100.8 F (38.2 C) 99.2 F (37.3 C) 99 F (37.2 C)  TempSrc:  Oral Oral Oral  SpO2: 97% (!) 89% 97%   Weight:      Height:  Intake/Output Summary (Last 24 hours) at 01/05/2023 0759 Last data filed at 01/05/2023 0600 Gross per 24 hour  Intake --  Output 1400 ml  Net -1400 ml   Filed Weights   01/03/23 1037  Weight: 67 kg    Examination:  General: Currently on room air.  No distress.   Chronically ill and deconditioned looking.  Elderly female lying in bed. ENT/neck: No thyromegaly.  JVD is not elevated  respiratory: Decreased breath sounds at bases bilaterally with some crackles; no wheezing  CVS: S1-S2 heard, rate controlled currently Abdominal: Soft, nontender, slightly distended; no organomegaly, bowel sounds are heard Extremities: Trace lower extremity edema; no cyanosis  CNS: Awake.  Slow to respond.  Poor historian.  No focal neurologic deficit.  Moves extremities Lymph: No obvious lymphadenopathy Skin: No obvious ecchymosis/lesions  psych: Not agitated currently.  Flat affect mostly. musculoskeletal: No obvious joint swelling/deformity     Data Reviewed: I have personally reviewed following labs and imaging studies  CBC: Recent Labs  Lab 01/03/23 1055 01/04/23 0604 01/05/23 0551  WBC 6.0 6.2 4.4  NEUTROABS 3.7  --  1.7  HGB 10.9* 12.3 10.7*  HCT 33.6* 37.8 32.3*  MCV 91.1 90.0 90.0  PLT 237 241 211   Basic Metabolic Panel: Recent Labs  Lab 01/03/23 1055 01/04/23 0604 01/04/23 0818 01/05/23 0551  NA 135 136  --  137  K 3.5 3.1*  --  3.7  CL 103 99  --  107  CO2 23 24  --  22  GLUCOSE 96 120*  --  104*  BUN 13 8  --  13  CREATININE 0.97 1.08*  --  1.10*  CALCIUM 8.9 8.7*  --  8.1*  MG  --   --  1.6* 2.1   GFR: Estimated Creatinine Clearance: 33.1 mL/min (A) (by C-G formula based on SCr of 1.1 mg/dL (H)). Liver Function Tests: Recent Labs  Lab 01/03/23 1055 01/05/23 0551  AST 43* 67*  ALT 26 36  ALKPHOS 51 42  BILITOT 0.5 0.3  PROT 6.5 5.8*  ALBUMIN 3.7 2.8*   No results for input(s): "LIPASE", "AMYLASE" in the last 168 hours. No results for input(s): "AMMONIA" in the last 168 hours. Coagulation Profile: Recent Labs  Lab 01/03/23 1055  INR 1.2   Cardiac Enzymes: No results for input(s): "CKTOTAL", "CKMB", "CKMBINDEX", "TROPONINI" in the last 168 hours. BNP (last 3 results) No results for input(s): "PROBNP" in the last  8760 hours. HbA1C: No results for input(s): "HGBA1C" in the last 72 hours. CBG: No results for input(s): "GLUCAP" in the last 168 hours. Lipid Profile: No results for input(s): "CHOL", "HDL", "LDLCALC", "TRIG", "CHOLHDL", "LDLDIRECT" in the last 72 hours. Thyroid Function Tests: No results for input(s): "TSH", "T4TOTAL", "FREET4", "T3FREE", "THYROIDAB" in the last 72 hours. Anemia Panel: No results for input(s): "VITAMINB12", "FOLATE", "FERRITIN", "TIBC", "IRON", "RETICCTPCT" in the last 72 hours. Sepsis Labs: Recent Labs  Lab 01/03/23 1313 01/03/23 1839 01/04/23 0818 01/04/23 1108  LATICACIDVEN 4.4* 0.6 1.0 2.4*    Recent Results (from the past 240 hour(s))  Blood Culture (routine x 2)     Status: None (Preliminary result)   Collection Time: 01/03/23 11:10 AM   Specimen: BLOOD RIGHT FOREARM  Result Value Ref Range Status   Specimen Description BLOOD RIGHT FOREARM  Final   Special Requests   Final    BOTTLES DRAWN AEROBIC AND ANAEROBIC Blood Culture adequate volume   Culture   Final    NO GROWTH 2 DAYS  Performed at [facility], [address], [place], [place] [address]    Report Status PENDING  Incomplete  Blood Culture (routine x 2)     Status: None (Preliminary result)   Collection Time: 01/03/23 11:15 AM   Specimen: BLOOD RIGHT HAND  Result Value Ref Range Status   Specimen Description BLOOD RIGHT HAND  Final   Special Requests   Final    BOTTLES DRAWN AEROBIC AND ANAEROBIC Blood Culture adequate volume   Culture   Final    NO GROWTH 2 DAYS Performed at [facility], [address] [place], [place] [address]    Report Status PENDING  Incomplete  Culture, blood (Routine X 2) w Reflex to ID Panel     Status: None (Preliminary result)   Collection Time: 01/04/23  8:17 AM   Specimen: BLOOD  Result Value Ref Range Status   Specimen Description BLOOD BLOOD RIGHT HAND  Final   Special Requests   Final    BOTTLES DRAWN AEROBIC AND ANAEROBIC Blood Culture  adequate volume   Culture   Final    NO GROWTH < 24 HOURS Performed at [facility], [address] [place], [place] [address]    Report Status PENDING  Incomplete  Culture, blood (Routine X 2) w Reflex to ID Panel     Status: None (Preliminary result)   Collection Time: 01/04/23  8:18 AM   Specimen: BLOOD  Result Value Ref Range Status   Specimen Description BLOOD BLOOD RIGHT HAND  Final   Special Requests   Final    BOTTLES DRAWN AEROBIC AND ANAEROBIC Blood Culture adequate volume   Culture   Final    NO GROWTH < 24 HOURS Performed at [facility], [address] [place], [place] [address]    Report Status PENDING  Incomplete  Resp panel by RT-PCR (RSV, Flu A&B, Covid)     Status: Abnormal   Collection Time: 01/04/23  9:25 AM  Result Value Ref Range Status   SARS Coronavirus 2 by RT PCR POSITIVE (A) NEGATIVE Final   Influenza A by PCR NEGATIVE NEGATIVE Final   Influenza B by PCR NEGATIVE NEGATIVE Final    Comment: (NOTE) The Xpert Xpress SARS-CoV-2/FLU/RSV plus assay is intended as an aid in the diagnosis of influenza from Nasopharyngeal swab specimens and should not be used as a sole basis for treatment. Nasal washings and aspirates are unacceptable for Xpert Xpress SARS-CoV-2/FLU/RSV testing.  Fact Sheet for Patients: BloggerCourse.com  Fact Sheet for Healthcare Providers: SeriousBroker.it  This test is not yet approved or cleared by the [place] FDA and has been authorized for detection and/or diagnosis of SARS-CoV-2 by FDA under an Emergency Use Authorization (EUA). This EUA will remain in effect (meaning this test can be used) for the duration of the COVID-19 declaration under Section 564(b)(1) of the Act, 21 U.S.C. section 360bbb-3(b)(1), unless the authorization is terminated or revoked.     Resp Syncytial Virus by PCR NEGATIVE NEGATIVE Final    Comment: (NOTE) Fact Sheet for  Patients: BloggerCourse.com  Fact Sheet for Healthcare Providers: SeriousBroker.it  This test is not yet approved or cleared by the [place] FDA and has been authorized for detection and/or diagnosis of SARS-CoV-2 by FDA under an Emergency Use Authorization (EUA). This EUA will remain in effect (meaning this test can be used) for the duration of the COVID-19 declaration under Section 564(b)(1) of the Act, 21 U.S.C. section 360bbb-3(b)(1), unless the authorization  is terminated or revoked.  Performed at [facility], [address], [place], [place] [address]          Radiology Studies: DG Chest Port 1 View  Result Date: 01/04/2023 CLINICAL DATA:  [id number] Fever [id number] EXAM: PORTABLE CHEST 1 VIEW COMPARISON:  CXR 01/03/23 FINDINGS: No pleural effusion. No pneumothorax. Normal cardiac and mediastinal contours. No focal airspace opacity. No radiographically apparent displaced rib fractures. Visualized upper abdomen is unremarkable. IMPRESSION: No focal airspace opacity Electronically Signed   By: Lorenza Cambridge M.D.   On: 01/04/2023 08:15   CT ABDOMEN PELVIS WO CONTRAST  Result Date: 01/03/2023 CLINICAL DATA:  Acute, non localized abdominal pain. EXAM: CT ABDOMEN AND PELVIS WITHOUT CONTRAST TECHNIQUE: Multidetector CT imaging of the abdomen and pelvis was performed following the standard protocol without IV contrast. RADIATION DOSE REDUCTION: This exam was performed according to the departmental dose-optimization program which includes automated exposure control, adjustment of the mA and/or kV according to patient size and/or use of iterative reconstruction technique. COMPARISON:  07/11/2007.  Chest CTA dated 05/06/2021. FINDINGS: Lower chest: Borderline enlarged heart. Small hiatal hernia. Mild linear scarring/atelectasis at both lung bases. Hepatobiliary: No focal liver abnormality is seen. No gallstones, gallbladder wall  thickening, or biliary dilatation. Pancreas: Unremarkable. No pancreatic ductal dilatation or surrounding inflammatory changes. Spleen: Normal in size without focal abnormality. Adrenals/Urinary Tract: Normal appearing adrenal glands with the exception of a small amount of calcification again demonstrated in the right adrenal gland. A simple appearing left renal cyst is again demonstrated with a mean density of -2.8 Hounsfield units. Normal-appearing right kidney, ureters and urinary bladder. Stomach/Bowel: Small hiatal hernia. Prominent stool in the right and transverse colon. Normal-appearing appendix. Unremarkable small bowel. Vascular/Lymphatic: Atheromatous arterial calcifications. These include dense calcifications at the origins of the celiac axis and superior mesenteric artery with approximately 80% stenosis of the origin of the celiac axis and proximally 60% stenosis of the origin of the superior mesenteric artery. No enlarged lymph nodes. Reproductive: Status post hysterectomy. No adnexal masses. Other: No abdominal wall hernia or abnormality. No abdominopelvic ascites. Musculoskeletal: Lumbar and lower thoracic spine degenerative changes. IMPRESSION: 1. No acute abnormality. 2. Prominent stool in the right and transverse colon. 3. Small hiatal hernia. 4. Borderline cardiomegaly. 5. 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 Electronically Signed   By: Beckie Salts M.D.   On: 01/03/2023 17:42   DG Chest Port 1 View  Result Date: 01/03/2023 CLINICAL DATA:  Dizziness. EXAM: PORTABLE CHEST 1 VIEW COMPARISON:  Chest radiograph dated July 22, 2022. FINDINGS: The heart size and mediastinal contours are within normal limits. Aortic calcification. No focal consolidation, pneumothorax, or sizable pleural effusion. No acute osseous abnormality. IMPRESSION: No active cardiopulmonary  disease. Electronically Signed   By: Hart Robinsons M.D.   On: 01/03/2023 13:42   CT Head Wo Contrast  Result Date: 01/03/2023 CLINICAL DATA:  Minor head trauma EXAM: CT HEAD WITHOUT CONTRAST TECHNIQUE: Contiguous axial images were obtained from the base of the skull through the vertex without intravenous contrast. RADIATION DOSE REDUCTION: This exam was performed according to the departmental dose-optimization program which includes automated exposure control, adjustment of the mA and/or kV according to patient size and/or use of iterative reconstruction technique. COMPARISON:  05/31/2019 FINDINGS: Brain: No evidence of acute infarction, hemorrhage, hydrocephalus, extra-axial collection or mass lesion/mass effect. Generalized brain atrophy that is mild for age.  Age normal white matter appearance Vascular: No hyperdense vessel or unexpected calcification. Skull: No acute or aggressive finding Sinuses/Orbits: Chronic sinusitis with generalized mucosal thickening. Prior endoscopic sinus surgery.No acute finding. IMPRESSION: No evidence of intracranial injury. Electronically Signed   By: Tiburcio Pea M.D.   On: 01/03/2023 13:19        Scheduled Meds:  apixaban  5 mg Oral BID   fluticasone furoate-vilanterol  1 puff Inhalation Daily   gabapentin  300 mg Oral QHS   influenza vaccine adjuvanted  0.5 mL Intramuscular Tomorrow-1000   metoprolol succinate  25 mg Oral Daily   ofloxacin  5 drop Right EAR Daily   rosuvastatin  20 mg Oral Daily   sodium chloride flush  3 mL Intravenous Q12H   Continuous Infusions:  sodium chloride 50 mL/hr at 01/04/23 1249   cefTRIAXone (ROCEPHIN)  IV 1 g (01/04/23 1009)          Glade Lloyd, MD [facility] 01/05/2023, 7:59 AM

## 2023-01-06 DIAGNOSIS — Z7189 Other specified counseling: Secondary | ICD-10-CM

## 2023-01-06 DIAGNOSIS — Z515 Encounter for palliative care: Secondary | ICD-10-CM | POA: Diagnosis not present

## 2023-01-06 DIAGNOSIS — R55 Syncope and collapse: Secondary | ICD-10-CM | POA: Diagnosis not present

## 2023-01-06 LAB — COMPREHENSIVE METABOLIC PANEL
ALT: 34 U/L (ref 0–44)
AST: 58 U/L — ABNORMAL HIGH (ref 15–41)
Albumin: 2.8 g/dL — ABNORMAL LOW (ref 3.5–5.0)
Alkaline Phosphatase: 38 U/L (ref 38–126)
Anion gap: 9 (ref 5–15)
BUN: 14 mg/dL (ref 8–23)
CO2: 21 mmol/L — ABNORMAL LOW (ref 22–32)
Calcium: 8.5 mg/dL — ABNORMAL LOW (ref 8.9–10.3)
Chloride: 108 mmol/L (ref 98–111)
Creatinine, Ser: 0.98 mg/dL (ref 0.44–1.00)
GFR, Estimated: 55 mL/min — ABNORMAL LOW (ref >60)
Glucose, Bld: 119 mg/dL — ABNORMAL HIGH (ref 70–99)
Potassium: 3.9 mmol/L (ref 3.5–5.1)
Sodium: 138 mmol/L (ref 135–145)
Total Bilirubin: 0.2 mg/dL — ABNORMAL LOW (ref 0.3–1.2)
Total Protein: 5.7 g/dL — ABNORMAL LOW (ref 6.5–8.1)

## 2023-01-06 LAB — C-REACTIVE PROTEIN: CRP: 7.3 mg/dL — ABNORMAL HIGH (ref <1.0)

## 2023-01-06 LAB — MAGNESIUM: Magnesium: 1.9 mg/dL (ref 1.7–2.4)

## 2023-01-06 MED ORDER — AMLODIPINE BESYLATE 5 MG PO TABS
5.0000 mg | ORAL_TABLET | Freq: Every day | ORAL | Status: DC
  Administered 2023-01-06 – 2023-01-07 (×2): 5 mg via ORAL
  Filled 2023-01-06 (×2): qty 1

## 2023-01-06 NOTE — Progress Notes (Signed)
PROGRESS NOTE    Anne [name]  ZOX:[id number] DOB: [birth year] DOA: 01/03/2023 PCP: Genelle Gather, DO   Brief Narrative:  89 y.o. female with medical history significant for PAF on Eliquis, asthma, HTN, HLD presented with lightheadedness, loss of balance and fall to the floor without loss of consciousness.  She apparently was on the floor for about 2 hours before she called family for assistance.  Agitation, temperature was 100.4 rectally.  WBC of 6.  Lactic acid of 1.2 then 4.4.  UA with more than 50 RBCs but negative nitrites/leukocytes and no pyuria.  Portable chest x-ray was negative for focal consolidation, edema or effusion.  CT of the head without contrast was negative for any acute intracranial injury.  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  She was started on IV fluids.  Assessment & Plan:   COVID-19 infection -Tested positive for COVID-19 infection on 01/04/2023 after she was spiking temperatures.  Chest x-ray on presentation did not show any focal airspace opacity.   -Still on room air.  Complains of intermittent cough.  CRP elevated.  Continue Solu-Medrol. -No temperature spikes over the last 24 hours.  Lactic acidosis -Resolved  Hypotension -Resolved.  Blood pressure intermittently on the higher side currently.  Monitor.  Near syncope -Questionable cause.  May be orthostatic in nature.  Monitor orthostatic vitals.  Treated with IV fluids.  Off IV fluids.  Encourage oral intake. -Continue telemetry monitoring  Hematuria -UA showed more than 50 RBCs but negative for pyuria.  No gross hematuria.  Patient has history of chronic hematuria.  CT of abdomen and pelvis negative for acute process.  Outpatient follow-up with urology  Paroxysmal A-fib -Currently rate controlled.  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  Vascular surgery evaluation appreciated: Outpatient follow-up with vascular surgery in 3 to 6 months with mesenteric artery duplex  Hypokalemia -Improved  Hypomagnesemia -Improved  Hypertension Hyperlipidemia -Continue Toprol-XL.  Blood pressure on the higher side currently.  Might have to resume amlodipine as well. -Continue statin  Asthma -Stable.  Continue current inhaled regimen.  Physical deconditioning -PT recommended SNF placement.  TOC consulted.  Goals of care -Currently listed as full code.  Palliative care consultation pending.  DVT prophylaxis: Eliquis Code Status: Full Family Communication: Niece at bedside on 01/04/2023 Disposition Plan: Status is: inpatient because: Of severity of illness.  Need for SNF placement.  Currently stable for discharge to SNF. Consultants: Vascular surgery  Procedures: None  Antimicrobials:  Rocephin from 01/03/2023 onwards  Subjective: Patient seen and examined at bedside.  Poor historian.  No fever or vomiting reported.  Still complains of intermittent cough.  Feels weak.. Objective: Vitals:   01/05/23 1555 01/05/23 2107 01/06/23 0043 01/06/23 0458  BP: (!) 133/51 (!) 152/59 (!) 173/57 (!) 165/63  Pulse: 70 67 66 (!) 59  Resp: 18 16 17 18   Temp: 99.6 F (37.6 C) 98.4 F (36.9 C) 99.8 F (37.7 C) 98.5 F (36.9 C)  TempSrc: Oral Oral Oral Oral  SpO2: 99% 97% 97% 95%  Weight:      Height:        Intake/Output Summary (Last 24 hours) at 01/06/2023 0801 Last data filed at 01/06/2023 [phone]  Gross per 24 hour  Intake --  Output 1050 ml  Net -1050 ml   Filed Weights   01/03/23 1037  Weight: 67 kg    Examination:  General: No acute distress.  Remains on room air.  Chronically ill and deconditioned looking.  Elderly female lying in bed. ENT/neck: No  neck masses or elevated JVD noted respiratory: Bilateral decreased breath sounds at bases with some scattered crackles  CVS: S1-S2 heard, mild intermittent bradycardia present  abdominal: Soft, nontender, distended mildly; no organomegaly, bowel sounds normally heard  extremities: No clubbing; mild lower extremity edema present CNS: Alert; still slow to respond.  Poor historian.  No obvious focal deficits noted.  Lymph: No palpable lymphadenopathy Skin: No obvious petechiae/rashes  psych: Mostly flat affect.  Currently not agitated.   Musculoskeletal: No obvious joint tenderness/deformity    Data Reviewed: I have personally reviewed following labs and imaging studies  CBC: Recent Labs  Lab 01/03/23 1055 01/04/23 0604 01/05/23 0551  WBC 6.0 6.2 4.4  NEUTROABS 3.7  --  1.7  HGB 10.9* 12.3 10.7*  HCT 33.6* 37.8 32.3*  MCV 91.1 90.0 90.0  PLT 237 241 211   Basic Metabolic Panel: Recent Labs  Lab 01/03/23 1055 01/04/23 0604 01/04/23 0818 01/05/23 0551  NA 135 136  --  137  K 3.5 3.1*  --  3.7  CL 103 99  --  107  CO2 23 24  --  22  GLUCOSE 96 120*  --  104*  BUN 13 8  --  13  CREATININE 0.97 1.08*  --  1.10*  CALCIUM 8.9 8.7*  --  8.1*  MG  --   --  1.6* 2.1   GFR: Estimated Creatinine Clearance: 33.1 mL/min (A) (by C-G formula based on SCr of 1.1 mg/dL (H)). Liver Function Tests: Recent Labs  Lab 01/03/23 1055 01/05/23 0551  AST 43* 67*  ALT 26 36  ALKPHOS 51 42  BILITOT 0.5 0.3  PROT 6.5 5.8*  ALBUMIN 3.7 2.8*   No results for input(s): "LIPASE", "AMYLASE" in the last 168 hours. No results for input(s): "AMMONIA" in the last 168 hours. Coagulation Profile: Recent Labs  Lab 01/03/23 1055  INR 1.2   Cardiac Enzymes: No results for input(s): "CKTOTAL", "CKMB", "CKMBINDEX", "TROPONINI" in the last 168 hours. BNP (last 3 results) No results for input(s): "PROBNP" in the last 8760 hours. HbA1C: No results for input(s): "HGBA1C" in the last 72  hours. CBG: No results for input(s): "GLUCAP" in the last 168 hours. Lipid Profile: No results for input(s): "CHOL", "HDL", "LDLCALC", "TRIG", "CHOLHDL", "LDLDIRECT" in the last 72 hours. Thyroid Function Tests: No results for input(s): "TSH", "T4TOTAL", "FREET4", "T3FREE", "THYROIDAB" in the last 72 hours. Anemia Panel: Recent Labs    01/05/23 0551  FERRITIN 122   Sepsis Labs: Recent Labs  Lab 01/03/23 1313 01/03/23 1839 01/04/23 0818 01/04/23 1108  LATICACIDVEN 4.4* 0.6 1.0 2.4*    Recent Results (from the past 240 hour(s))  Blood Culture (routine x 2)     Status: None (Preliminary result)   Collection Time: 01/03/23 11:10 AM   Specimen: BLOOD RIGHT FOREARM  Result Value Ref Range Status   Specimen Description BLOOD RIGHT FOREARM  Final   Special Requests   Final    BOTTLES DRAWN AEROBIC AND ANAEROBIC Blood Culture adequate volume   Culture   Final    NO GROWTH 3 DAYS Performed at [facility], [address], [place], [place] [address]  Report Status PENDING  Incomplete  Blood Culture (routine x 2)     Status: None (Preliminary result)   Collection Time: 01/03/23 11:15 AM   Specimen: BLOOD RIGHT HAND  Result Value Ref Range Status   Specimen Description BLOOD RIGHT HAND  Final   Special Requests   Final    BOTTLES DRAWN AEROBIC AND ANAEROBIC Blood Culture adequate volume   Culture   Final    NO GROWTH 3 DAYS Performed at [facility], [address], [place], [place] [address]    Report Status PENDING  Incomplete  Culture, blood (Routine X 2) w Reflex to ID Panel     Status: None (Preliminary result)   Collection Time: 01/04/23  8:17 AM   Specimen: BLOOD  Result Value Ref Range Status   Specimen Description BLOOD BLOOD RIGHT HAND  Final   Special Requests   Final    BOTTLES DRAWN AEROBIC AND ANAEROBIC Blood Culture adequate volume   Culture   Final    NO GROWTH 2 DAYS Performed at [facility], [address] [place], [place]  [address]    Report Status PENDING  Incomplete  Culture, blood (Routine X 2) w Reflex to ID Panel     Status: None (Preliminary result)   Collection Time: 01/04/23  8:18 AM   Specimen: BLOOD  Result Value Ref Range Status   Specimen Description BLOOD BLOOD RIGHT HAND  Final   Special Requests   Final    BOTTLES DRAWN AEROBIC AND ANAEROBIC Blood Culture adequate volume   Culture   Final    NO GROWTH 2 DAYS Performed at [facility], [address], [place], [place] [address]    Report Status PENDING  Incomplete  Resp panel by RT-PCR (RSV, Flu A&B, Covid)     Status: Abnormal   Collection Time: 01/04/23  9:25 AM  Result Value Ref Range Status   SARS Coronavirus 2 by RT PCR POSITIVE (A) NEGATIVE Final   Influenza A by PCR NEGATIVE NEGATIVE Final   Influenza B by PCR NEGATIVE NEGATIVE Final    Comment: (NOTE) The Xpert Xpress SARS-CoV-2/FLU/RSV plus assay is intended as an aid in the diagnosis of influenza from Nasopharyngeal swab specimens and should not be used as a sole basis for treatment. Nasal washings and aspirates are unacceptable for Xpert Xpress SARS-CoV-2/FLU/RSV testing.  Fact Sheet for Patients: BloggerCourse.com  Fact Sheet for Healthcare Providers: SeriousBroker.it  This test is not yet approved or cleared by the [place] FDA and has been authorized for detection and/or diagnosis of SARS-CoV-2 by FDA under an Emergency Use Authorization (EUA). This EUA will remain in effect (meaning this test can be used) for the duration of the COVID-19 declaration under Section 564(b)(1) of the Act, 21 U.S.C. section 360bbb-3(b)(1), unless the authorization is terminated or revoked.     Resp Syncytial Virus by PCR NEGATIVE NEGATIVE Final    Comment: (NOTE) Fact Sheet for Patients: BloggerCourse.com  Fact Sheet for Healthcare Providers: SeriousBroker.it  This test is  not yet approved or cleared by the [place] FDA and has been authorized for detection and/or diagnosis of SARS-CoV-2 by FDA under an Emergency Use Authorization (EUA). This EUA will remain in effect (meaning this test can be used) for the duration of the COVID-19 declaration under Section 564(b)(1) of the Act, 21 U.S.C. section 360bbb-3(b)(1), unless the authorization is terminated or revoked.  Performed at [facility], [address], [place], [place] [address]  Radiology Studies: No results found.      Scheduled Meds:  apixaban  5 mg Oral BID   fluticasone furoate-vilanterol  1 puff Inhalation Daily   gabapentin  300 mg Oral QHS   influenza vaccine adjuvanted  0.5 mL Intramuscular Tomorrow-1000   methylPREDNISolone (SOLU-MEDROL) injection  40 mg Intravenous Q24H   metoprolol succinate  25 mg Oral Daily   ofloxacin  5 drop Right EAR Daily   rosuvastatin  20 mg Oral Daily   sodium chloride flush  3 mL Intravenous Q12H   Continuous Infusions:          Glade Lloyd, MD [facility] 01/06/2023, 8:01 AM

## 2023-01-06 NOTE — Consult Note (Signed)
Palliative Medicine Inpatient Consult Note  Consulting Provider:  Glade Lloyd, MD    Reason for consult:   Palliative Care Consult Services Palliative Medicine Consult  Reason for Consult? goals of care   01/06/2023  HPI:  Per intake H&P --> 89 y.o. female with medical history significant for PAF on Eliquis, asthma, HTN, HLD presented with lightheadedness, loss of balance and fall to the floor without loss of consciousness.  She apparently was on the floor for about 2 hours before she called family for assistance.    Palliative care asked to get involved to assist with goals of care conversations.   Clinical Assessment/Goals of Care:  *Please note that this is a verbal dictation therefore any spelling or grammatical errors are due to the "Dragon Medical One" system interpretation.  I have reviewed medical records including EPIC notes, labs and imaging, received report from bedside RN, assessed the patient who is cleaning herself at the sink with her RN present.    I met with Kashlynn to further discuss diagnosis prognosis, GOC, EOL wishes, disposition and options.   I introduced Palliative Medicine as specialized medical care for people living with serious illness. It focuses on providing relief from the symptoms and stress of a serious illness. The goal is to improve quality of life for both the patient and the family.  Medical History Review and Understanding:  A review of Mylena's past medical history inclusive of her atrial fibrillation, asthma, hyperlipidemia, hypertension, and recent COVID infection was held.  Social History:  Tylea has lived the majority of her life in [place].  She was married to her husband for over 50 years though sadly he passed away 5 years ago.  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  She  shares she held that job for over 30 years and brought her tremendous joy.  She is a woman of the [place] faith.  She gets enjoyment out of spending time with her family.  Functional and Nutritional State:  Preceding hospitalization Taliya was living in a single-family home able to do all B ADLs and IADLs independently.  Advance Directives:  A detailed discussion was had today regarding advanced directives.  An shares that she does have advanced directives with her lawyer and her surrogate decision maker is her niece Duanne Limerick.  Code Status:  Concepts specific to code status, artifical feeding and hydration, continued IV antibiotics and rehospitalization was had.  The difference between a aggressive medical intervention path  and a palliative comfort care path for this patient at this time was had.   Encouraged patient/family to consider DNR/DNI status understanding evidenced based poor outcomes in similar hospitalized patient, as the cause of arrest is likely associated with advanced chronic/terminal illness rather than an easily reversible acute cardio-pulmonary event. I explained that DNR/DNI does not change the medical plan and it only comes into effect after a person has arrested (died).  It is a protective measure to keep [place] from harming the patient in their last moments of life.  Ellaina would want interventions pursued to prolong life if they were found to be beneficial.  I shared with her my concerns associated with the traumas related to resuscitative efforts though at this point she vocalizes that she and her sister who is 28 would want measures pursued to sustain life. Provided  "  Hard Choices for Loving People" booklet and a MOST form.  I encouraged Nikiya to speak more to her PCP about her wishes regarding resuscitation which she shares she will.   Discussion:  We reviewed the circumstances leading to [facility] admission.  She and I discussed her current health state and she  endorses overall feeling improved though she recognizes she still has some muscular weakness.  She shares that her goals are to go to rehab for a few days to optimize her strength and then to get back to her home.  SAR expresses in the future she and her niece Maxine plan to move in together to further support each other as they age.  Plan for continuation of current care.   Discussed the importance of continued conversation with family and their  medical providers regarding overall plan of care and treatment options, ensuring decisions are within the context of the patients values and GOCs.  Decision Maker: Remus Blake (Niece): [phone] (Mobile)   SUMMARY OF RECOMMENDATIONS   Full Code --> Provided Hard Choices Book. Patient will discuss this further with her PCP. A MOST form was provided.   Continue current care  Appreciate TOC helping with placement options  PMT will continue to check in incrementally given clarity of goals  Code Status/Advance Care Planning: FULL CODE  Palliative Prophylaxis:  Aspiration, Bowel Regimen, Delirium Protocol, Frequent Pain Assessment, Oral Care, Palliative Wound Care, and Turn Reposition  Additional Recommendations (Limitations, Scope, Preferences): Continue current care  Psycho-social/Spiritual:  Desire for further Chaplaincy support: Yes - [facility] Additional Recommendations: Education on disease progression   Prognosis: Patient is well functioning. Despite ago and co-morbidities has projected longevity beyond 1 year.   Discharge Planning: Discharge to SNF once medially optimized.  Vitals:   01/06/23 0043 01/06/23 0458  BP: (!) 173/57 (!) 165/63  Pulse: 66 (!) 59  Resp: 17 18  Temp: 99.8 F (37.7 C) 98.5 F (36.9 C)  SpO2: 97% 95%    Intake/Output Summary (Last 24 hours) at 01/06/2023 0745 Last data filed at 01/06/2023 0503 Gross per 24 hour  Intake --  Output 1050 ml  Net -1050 ml   Last Weight  Most recent update:  01/03/2023 10:37 AM    Weight  67 kg (147 lb 11.3 oz)            Gen:  Elderly AA F in NAD HEENT: moist mucous membranes CV: Regular rate and rhythm  PULM:Breathing is even and nonlabored ABD: soft/nontender  EXT: No edema  Neuro: Alert and oriented x3   PPS: 70%   This conversation/these recommendations were discussed with patient primary care team, Dr. Hanley Ben  Billing based on MDM: High  Problems Addressed: One acute or chronic illness or injury that poses a threat to life or bodily function  Amount and/or Complexity of Data: Category 3:Discussion of management or test interpretation with external physician/other qualified health care professional/appropriate source (not separately reported)  Risks: Decision regarding hospitalization or escalation of hospital care ______________________________________________________ Lamarr Lulas [facility] Palliative Medicine Team Team Cell Phone: [phone] Please utilize secure chat with additional questions, if there is no response within 30 minutes please call the above phone number  Palliative Medicine Team providers are available by phone from 7am to 7pm daily and can be reached through the team cell phone.  Should this patient require assistance outside of these hours, please call the patient's attending physician.

## 2023-01-06 NOTE — Plan of Care (Signed)

## 2023-01-07 ENCOUNTER — Ambulatory Visit: Payer: Medicare PPO | Admitting: Neurology

## 2023-01-07 DIAGNOSIS — Z7189 Other specified counseling: Secondary | ICD-10-CM | POA: Diagnosis not present

## 2023-01-07 DIAGNOSIS — Z515 Encounter for palliative care: Secondary | ICD-10-CM | POA: Diagnosis not present

## 2023-01-07 DIAGNOSIS — R55 Syncope and collapse: Secondary | ICD-10-CM | POA: Diagnosis not present

## 2023-01-07 MED ORDER — LATANOPROST 0.005 % OP SOLN
1.0000 [drp] | Freq: Every day | OPHTHALMIC | Status: DC
  Filled 2023-01-07: qty 2.5

## 2023-01-07 MED ORDER — GUAIFENESIN-DM 100-10 MG/5ML PO SYRP: 10.0000 mL | ORAL_SOLUTION | ORAL | 0 refills | Status: DC | PRN

## 2023-01-07 MED ORDER — PREDNISONE 20 MG PO TABS: 40.0000 mg | ORAL_TABLET | Freq: Every day | ORAL | 0 refills | Status: AC

## 2023-01-07 MED ORDER — MECLIZINE HCL 12.5 MG PO TABS: 12.5000 mg | ORAL_TABLET | Freq: Three times a day (TID) | ORAL | 0 refills | Status: AC | PRN

## 2023-01-07 MED ORDER — DORZOLAMIDE HCL-TIMOLOL MAL 2-0.5 % OP SOLN
1.0000 [drp] | Freq: Two times a day (BID) | OPHTHALMIC | Status: DC
  Administered 2023-01-07: 1 [drp] via OPHTHALMIC
  Filled 2023-01-07: qty 10

## 2023-01-07 NOTE — Progress Notes (Signed)
PROGRESS NOTE    Anne [name]  ZOX:[id number] DOB: 1930-07-15 DOA: 01/03/2023 PCP: Genelle Gather, DO   Brief Narrative:  89 y.o. female with medical history significant for PAF on Eliquis, asthma, HTN, HLD presented with lightheadedness, loss of balance and fall to the floor without loss of consciousness.  She apparently was on the floor for about 2 hours before she called family for assistance.  Agitation, temperature was 100.4 rectally.  WBC of 6.  Lactic acid of 1.2 then 4.4.  UA with more than 50 RBCs but negative nitrites/leukocytes and no pyuria.  Portable chest x-ray was negative for focal consolidation, edema or effusion.  CT of the head without contrast was negative for any acute intracranial injury.  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  She was started on IV fluids.  She also subsequently tested positive for COVID.  Assessment & Plan:   COVID-19 infection -Tested positive for COVID-19 infection on 01/04/2023 after she was spiking temperatures.  Chest x-ray on presentation did not show any focal airspace opacity.   -Still on room air.  CRP elevated but improving.  Continue Solu-Medrol. -No temperature spikes over the last couple of days.  Lactic acidosis -Resolved  Hypotension -Resolved.  Blood pressure intermittently on the higher side currently.  Monitor.  Near syncope -Questionable cause.  May be orthostatic in nature.  Monitor orthostatic vitals.  Treated with IV fluids.  Off IV fluids.  Encourage oral intake. -Continue telemetry monitoring  Hematuria -UA showed more than 50 RBCs but negative for pyuria.  No gross hematuria.  Patient has history of chronic hematuria.  CT of abdomen and pelvis negative for acute process.  Outpatient follow-up with urology  Paroxysmal A-fib -Currently rate controlled.  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  Vascular surgery evaluation appreciated: Outpatient follow-up with vascular surgery in 3 to 6 months with mesenteric artery duplex  Hypokalemia -Improved  Hypomagnesemia -Improved  Hypertension Hyperlipidemia -Continue Toprol-XL.  Blood pressure on the higher side currently.  Might have to resume amlodipine as well. -Continue statin  Asthma -Stable.  Continue current inhaled regimen.  Physical deconditioning -PT recommended SNF placement.  TOC following.  Patient will need to complete isolation before SNF placement as per TOC.  Goals of care -Currently listed as full code.  Palliative care evaluation appreciated.  Remains full code  DVT prophylaxis: Eliquis Code Status: Full Family Communication: Niece at bedside on 01/04/2023 Disposition Plan: Status is: inpatient because: Of severity of illness.  Need for SNF placement.  Currently medically stable for discharge to SNF. Consultants: Vascular surgery  Procedures: None  Antimicrobials:  Rocephin from 01/03/2023 onwards  Subjective: Patient seen and examined at bedside.  Poor historian.  Denies fever, vomiting or abdominal pain.   Objective: Vitals:   01/06/23 2131 01/07/23 0207 01/07/23 0607 01/07/23 0742  BP: 130/78 (!) 158/62 (!) 160/60   Pulse: 66 64 76 76  Resp: 17 18 17 18   Temp: 98.1 F (36.7 C) 98.3 F (36.8 C) 98.4 F (36.9 C)   TempSrc: Oral Oral Axillary   SpO2: 98% 98% 97% 97%  Weight:      Height:        Intake/Output Summary (  Last 24 hours) at 01/07/2023 0746 Last data filed at 01/06/2023 2133 Gross per 24 hour  Intake --  Output 400 ml  Net -400 ml   Filed Weights   01/03/23 1037  Weight: 67 kg    Examination:  General: On room air.  No distress.  Elderly female lying in bed.  Looks  chronically ill and deconditioned. respiratory: Decreased breath sounds at bases bilaterally with some crackles CVS: Currently rate controlled; S1-S2 heard  abdominal: Soft, nontender, slightly distended, no organomegaly; normal bowel sounds are heard  extremities: Trace lower extremity edema; no clubbing.       Data Reviewed: I have personally reviewed following labs and imaging studies  CBC: Recent Labs  Lab 01/03/23 1055 01/04/23 0604 01/05/23 0551  WBC 6.0 6.2 4.4  NEUTROABS 3.7  --  1.7  HGB 10.9* 12.3 10.7*  HCT 33.6* 37.8 32.3*  MCV 91.1 90.0 90.0  PLT 237 241 211   Basic Metabolic Panel: Recent Labs  Lab 01/03/23 1055 01/04/23 0604 01/04/23 0818 01/05/23 0551 01/06/23 0646  NA 135 136  --  137 138  K 3.5 3.1*  --  3.7 3.9  CL 103 99  --  107 108  CO2 23 24  --  22 21*  GLUCOSE 96 120*  --  104* 119*  BUN 13 8  --  13 14  CREATININE 0.97 1.08*  --  1.10* 0.98  CALCIUM 8.9 8.7*  --  8.1* 8.5*  MG  --   --  1.6* 2.1 1.9   GFR: Estimated Creatinine Clearance: 37.1 mL/min (by C-G formula based on SCr of 0.98 mg/dL). Liver Function Tests: Recent Labs  Lab 01/03/23 1055 01/05/23 0551 01/06/23 0646  AST 43* 67* 58*  ALT 26 36 34  ALKPHOS 51 42 38  BILITOT 0.5 0.3 0.2*  PROT 6.5 5.8* 5.7*  ALBUMIN 3.7 2.8* 2.8*   No results for input(s): "LIPASE", "AMYLASE" in the last 168 hours. No results for input(s): "AMMONIA" in the last 168 hours. Coagulation Profile: Recent Labs  Lab 01/03/23 1055  INR 1.2   Cardiac Enzymes: No results for input(s): "CKTOTAL", "CKMB", "CKMBINDEX", "TROPONINI" in the last 168 hours. BNP (last 3 results) No results for input(s): "PROBNP" in the last 8760 hours. HbA1C: No results for input(s): "HGBA1C" in the last 72 hours. CBG: No results for input(s): "GLUCAP" in the last 168 hours. Lipid Profile: No results for input(s): "CHOL", "HDL", "LDLCALC", "TRIG", "CHOLHDL", "LDLDIRECT" in the last 72 hours. Thyroid Function  Tests: No results for input(s): "TSH", "T4TOTAL", "FREET4", "T3FREE", "THYROIDAB" in the last 72 hours. Anemia Panel: Recent Labs    01/05/23 0551  FERRITIN 122   Sepsis Labs: Recent Labs  Lab 01/03/23 1313 01/03/23 1839 01/04/23 0818 01/04/23 1108  LATICACIDVEN 4.4* 0.6 1.0 2.4*    Recent Results (from the past 240 hour(s))  Blood Culture (routine x 2)     Status: None (Preliminary result)   Collection Time: 01/03/23 11:10 AM   Specimen: BLOOD RIGHT FOREARM  Result Value Ref Range Status   Specimen Description BLOOD RIGHT FOREARM  Final   Special Requests   Final    BOTTLES DRAWN AEROBIC AND ANAEROBIC Blood Culture adequate volume   Culture   Final    NO GROWTH 3 DAYS Performed at [facility], [address], [place], [place] [address]    Report Status PENDING  Incomplete  Blood Culture (routine x 2)     Status: None (Preliminary result)  Collection Time: 01/03/23 11:15 AM   Specimen: BLOOD RIGHT HAND  Result Value Ref Range Status   Specimen Description BLOOD RIGHT HAND  Final   Special Requests   Final    BOTTLES DRAWN AEROBIC AND ANAEROBIC Blood Culture adequate volume   Culture   Final    NO GROWTH 3 DAYS Performed at [facility], [address] [place], [place] [address]    Report Status PENDING  Incomplete  Culture, blood (Routine X 2) w Reflex to ID Panel     Status: None (Preliminary result)   Collection Time: 01/04/23  8:17 AM   Specimen: BLOOD  Result Value Ref Range Status   Specimen Description BLOOD BLOOD RIGHT HAND  Final   Special Requests   Final    BOTTLES DRAWN AEROBIC AND ANAEROBIC Blood Culture adequate volume   Culture   Final    NO GROWTH 2 DAYS Performed at [facility], [address] [place], [place] [address]    Report Status PENDING  Incomplete  Culture, blood (Routine X 2) w Reflex to ID Panel     Status: None (Preliminary result)   Collection Time: 01/04/23  8:18 AM   Specimen: BLOOD  Result Value Ref Range  Status   Specimen Description BLOOD BLOOD RIGHT HAND  Final   Special Requests   Final    BOTTLES DRAWN AEROBIC AND ANAEROBIC Blood Culture adequate volume   Culture   Final    NO GROWTH 2 DAYS Performed at [facility], [address] [place], [place] [address]    Report Status PENDING  Incomplete  Resp panel by RT-PCR (RSV, Flu A&B, Covid)     Status: Abnormal   Collection Time: 01/04/23  9:25 AM  Result Value Ref Range Status   SARS Coronavirus 2 by RT PCR POSITIVE (A) NEGATIVE Final   Influenza A by PCR NEGATIVE NEGATIVE Final   Influenza B by PCR NEGATIVE NEGATIVE Final    Comment: (NOTE) The Xpert Xpress SARS-CoV-2/FLU/RSV plus assay is intended as an aid in the diagnosis of influenza from Nasopharyngeal swab specimens and should not be used as a sole basis for treatment. Nasal washings and aspirates are unacceptable for Xpert Xpress SARS-CoV-2/FLU/RSV testing.  Fact Sheet for Patients: BloggerCourse.com  Fact Sheet for Healthcare Providers: SeriousBroker.it  This test is not yet approved or cleared by the [place] FDA and has been authorized for detection and/or diagnosis of SARS-CoV-2 by FDA under an Emergency Use Authorization (EUA). This EUA will remain in effect (meaning this test can be used) for the duration of the COVID-19 declaration under Section 564(b)(1) of the Act, 21 U.S.C. section 360bbb-3(b)(1), unless the authorization is terminated or revoked.     Resp Syncytial Virus by PCR NEGATIVE NEGATIVE Final    Comment: (NOTE) Fact Sheet for Patients: BloggerCourse.com  Fact Sheet for Healthcare Providers: SeriousBroker.it  This test is not yet approved or cleared by the [place] FDA and has been authorized for detection and/or diagnosis of SARS-CoV-2 by FDA under an Emergency Use Authorization (EUA). This EUA will remain in effect (meaning  this test can be used) for the duration of the COVID-19 declaration under Section 564(b)(1) of the Act, 21 U.S.C. section 360bbb-3(b)(1), unless the authorization is terminated or revoked.  Performed at [facility], [address] [place], [place] [address]          Radiology Studies: No results found.      Scheduled Meds:  amLODipine  5 mg Oral Daily   apixaban  5 mg Oral BID   fluticasone furoate-vilanterol  1 puff Inhalation Daily   gabapentin  300 mg Oral QHS   influenza vaccine adjuvanted  0.5 mL Intramuscular Tomorrow-1000   methylPREDNISolone (SOLU-MEDROL) injection  40 mg Intravenous Q24H   metoprolol succinate  25 mg Oral Daily   ofloxacin  5 drop Right EAR Daily   rosuvastatin  20 mg Oral Daily   sodium chloride flush  3 mL Intravenous Q12H   Continuous Infusions:          Glade Lloyd, MD [facility] 01/07/2023, 7:46 AM

## 2023-01-07 NOTE — TOC Transition Note (Addendum)
Transition of Care [facility]) - CM/SW Discharge Note   Patient Details  Name: Anne [name] MRN: [id number] Date of Birth: 10-03-1930  Transition of Care [facility]) CM/SW Contact:  Janae Bridgeman, RN Phone Number: 01/07/2023, 12:37 PM   Clinical Narrative:    Cm met with the patient at the bedside to discuss TOC needs.  The patient admitted for near syncopal episode and plans to return home today.  Medicare choice was offered to the patient regarding DMe and home health and the patient did not have a preference.  I called [facility] and they accepted for [facility] PT.  I called [employer] to deliver a 3:1 to the patient's bedside before discharging home.  Patient's family will provide transportation to home via car.  01/07/23 1441 - [employer] was unable to deliver the 3:1 to the hospital room prior to discharge.  I called [employer] and asked that they drop ship the 3:1 to the patient's home.  [employer] in agreement.   Final next level of care: Home w Home Health Services Barriers to Discharge: No Barriers Identified   Patient Goals and CMS Choice CMS Medicare.gov Compare Post Acute Care list provided to:: Patient Choice offered to / list presented to : Patient  Discharge Placement                         Discharge Plan and Services Additional resources added to the After Visit Summary for   In-house Referral: Clinical Social Work Discharge Planning Services: CM Consult Post Acute Care Choice: Resumption of Svcs/PTA Provider, Home Health          DME Arranged: 3-N-1 DME Agency: [facility] Date DME Agency Contacted: 01/07/23 Time DME Agency Contacted: 1236 Representative spoke with at DME Agency: [employer] called to deliver 3:1 to hospital room HH Arranged: PT HH Agency: [facility] Date [facility] Agency Contacted: 01/07/23 Time HH Agency Contacted: 1236 Representative spoke with at [facility] Agency: [facility] accepted  Social Determinants of Health (SDOH) Interventions SDOH Screenings    Food Insecurity: No Food Insecurity (01/03/2023)  Housing: Low Risk  (01/03/2023)  Transportation Needs: No Transportation Needs (01/03/2023)  Utilities: Not At Risk (01/03/2023)  Tobacco Use: Medium Risk (01/03/2023)     Readmission Risk Interventions     No data to display

## 2023-01-07 NOTE — Progress Notes (Signed)
Physical Therapy Treatment Patient Details Name: Franchester Barr MRN: [id number] DOB: 1931/01/22 Today's Date: 01/07/2023   History of Present Illness 89 yo female admitted 10/3 after fall out of bed. PMHx: PAF on Eliquis, asthma, HTN, HLD, glaucoma    PT Comments  Pt greeted resting in bed and agreeable to session with continued progress towards acute goals. Pt BP stable throughout all positional changes and pt without c/o dizziness with mobility. Pt able to complete bed mobility at supervision level and transfer to stand with CGA for safety with improved posture and no posterior bias noted. Pt progressing gait in room for multiple laps without AD support with CGA for safety progressing to supervision with increased distance. Pt up in chair at end of session and verbalizing understanding of education re; appropriate activity progression. Pt eager to return home and endorsing adequate support post acutely, discussed with supervising PT and recommendation updated. Pt continues to benefit from skilled PT services to progress toward functional mobility goals.     If plan is discharge home, recommend the following: A little help with walking and/or transfers;Direct supervision/assist for medications management;Assist for transportation;A little help with bathing/dressing/bathroom;Assistance with cooking/housework;Supervision due to cognitive status   Can travel by private vehicle     Yes  Equipment Recommendations  Rolling walker (2 wheels);BSC/3in1    Recommendations for Other Services       Precautions / Restrictions Precautions Precautions: Fall;Other (comment) Precaution Comments: watch bp     Mobility  Bed Mobility Overal bed mobility: Needs Assistance Bed Mobility: Supine to Sit     Supine to sit: Supervision, HOB elevated     General bed mobility comments: no physical assist needed, HOB 15 degrees    Transfers Overall transfer level: Needs assistance Equipment used:  None Transfers: Sit to/from Stand Sit to Stand: Contact guard assist           General transfer comment: CGA for safety, steady rise    Ambulation/Gait Ambulation/Gait assistance: Contact guard assist, Supervision Gait Distance (Feet): 120 Feet Assistive device: None Gait Pattern/deviations: Step-through pattern       General Gait Details: CGA proressing to supervision without AD support, pt initially reaching for support of foot of bed first 5' progressing to no support needed, x4 laps in room, no LOB   Stairs             Wheelchair Mobility     Tilt Bed    Modified Rankin (Stroke Patients Only)       Balance Overall balance assessment: Needs assistance, History of Falls Sitting-balance support: No upper extremity supported, Feet supported Sitting balance-Leahy Scale: Good Sitting balance - Comments: no LOB donning socks   Standing balance support: Bilateral upper extremity supported, No upper extremity supported Standing balance-Leahy Scale: Fair                              Cognition Arousal: Alert Behavior During Therapy: WFL for tasks assessed/performed Overall Cognitive Status: Within Functional Limits for tasks assessed                                 General Comments: pt HOH, somewhat tangential and distactible needing cues to remain on task        Exercises      General Comments General comments (skin integrity, edema, etc.): BP stable with all positional changes  Pertinent Vitals/Pain Pain Assessment Pain Assessment: No/denies pain    Home Living                          Prior Function            PT Goals (current goals can now be found in the care plan section) Acute Rehab PT Goals Patient Stated Goal: return home and play bridge PT Goal Formulation: With patient/family Time For Goal Achievement: 01/18/23 Progress towards PT goals: Progressing toward goals    Frequency    Min  1X/week      PT Plan      Co-evaluation              AM-PAC PT "6 Clicks" Mobility   Outcome Measure  Help needed turning from your back to your side while in a flat bed without using bedrails?: A Little Help needed moving from lying on your back to sitting on the side of a flat bed without using bedrails?: A Little Help needed moving to and from a bed to a chair (including a wheelchair)?: A Little Help needed standing up from a chair using your arms (e.g., wheelchair or bedside chair)?: A Little Help needed to walk in hospital room?: A Little Help needed climbing 3-5 steps with a railing? : Total 6 Click Score: 16    End of Session   Activity Tolerance: Patient tolerated treatment well Patient left: in chair;with call bell/phone within reach;with chair alarm set Nurse Communication: Mobility status PT Visit Diagnosis: Other abnormalities of gait and mobility (R26.89);History of falling (Z91.81);Muscle weakness (generalized) (M62.81)     Time: 5621-3086 PT Time Calculation (min) (ACUTE ONLY): 24 min  Charges:    $Gait Training: 8-22 mins $Therapeutic Activity: 8-22 mins PT General Charges $$ ACUTE PT VISIT: 1 Visit                     Carnesha Maravilla R. PTA Acute Rehabilitation Services Office: [phone]   Catalina Antigua 01/07/2023, 10:13 AM

## 2023-01-07 NOTE — Plan of Care (Signed)

## 2023-01-07 NOTE — Discharge Summary (Signed)
Physician Discharge Summary  Anne [name] VHQ:[id number] DOB: [birth year] DOA: 01/03/2023  PCP: Genelle Gather, DO  Admit date: 01/03/2023 Discharge date: 01/07/2023  Admitted From: Home Disposition: Home  Recommendations for Outpatient Follow-up:  Follow up with PCP in 1 week with repeat CBC/BMP Outpatient follow-up with vascular surgery Recommend outpatient evaluation and follow-up by cardiology Follow up in ED if symptoms worsen or new appear   Home Health: Home health PT. Equipment/Devices: None  Discharge Condition: Stable CODE STATUS: Full Diet recommendation: Heart healthy  Brief/Interim Summary: 89 y.o. female with medical history significant for PAF on Eliquis, asthma, HTN, HLD presented with lightheadedness, loss of balance and fall to the floor without loss of consciousness.  She apparently was on the floor for about 2 hours before she called family for assistance.  Agitation, temperature was 100.4 rectally.  WBC of 6.  Lactic acid of 1.2 then 4.4.  UA with more than 50 RBCs but negative nitrites/leukocytes and no pyuria.  Portable chest x-ray was negative for focal consolidation, edema or effusion.  CT of the head without contrast was negative for any acute intracranial injury.  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  She was started on IV fluids.  She also subsequently tested positive for COVID.  She was kept in isolation and was treated with IV Solu-Medrol.  She has remained on room air with no recent temperature spikes.  PT initially recommended SNF placement but subsequently has recommended home health PT.  She will be discharged home today with outpatient follow-up with PCP.  Discharge Diagnoses:   COVID-19 infection -Tested positive for COVID-19 infection on 01/04/2023 after she was spiking temperatures.  Chest x-ray on  presentation did not show any focal airspace opacity.   -Still on room air.  CRP elevated but improving.  Currently on Solu-Medrol. -No temperature spikes over the last couple of days. -Discharge home today on oral prednisone for another week.  Finish isolation at home.   Lactic acidosis -Resolved   Hypotension -Resolved.  Blood pressure intermittently on the higher side currently.     Near syncope -Questionable cause.  May be orthostatic in nature.  Treated with IV fluids.  Off IV fluids.  -Improved. -Recommend outpatient evaluation and follow-up with cardiology  Hematuria -UA showed more than 50 RBCs but negative for pyuria.  No gross hematuria.  Patient has history of chronic hematuria.  CT of abdomen and pelvis negative for acute process.  Outpatient follow-up with urology   Paroxysmal A-fib -Currently rate controlled.  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  Vascular surgery evaluation appreciated: Outpatient follow-up with vascular surgery in 3 to 6 months with mesenteric artery duplex   Hypokalemia -Improved   Hypomagnesemia -Improved   Hypertension Hyperlipidemia -Continue Toprol-XL.  Blood pressure on the higher side currently.  Resume amlodipine.  Outpatient follow-up. -Continue statin   Asthma -Stable.  Continue current inhaled regimen.   Physical deconditioning -PT initially recommended SNF placement.  PT is now recommending home health PT.  Goals of care -Currently listed as full code.  Palliative care evaluation appreciated.  Remains full code  Discharge Instructions  Discharge Instructions     Ambulatory referral to Cardiology  Complete by: As directed    Diet - low sodium heart healthy   Complete by: As directed    Increase activity slowly   Complete by: As  directed       Allergies as of 01/07/2023       Reactions   Other Shortness Of Breath   Pt states she has asthma and seafood causes breathing problems.    Shellfish Allergy Shortness Of Breath   Pt states she has asthma and seafood causes breathing problems.   Iodine Other (See Comments)   REACTION: " UNSURE TOLD TO LIST AS ALLERGEN BY MD" REACTION: " UNSURE TOLD TO LIST AS ALLERGEN BY MD"   Latex Itching, Rash   Methocarbamol Other (See Comments)   Pravastatin Other (See Comments)   Adhesive [tape] Rash        Medication List     TAKE these medications    albuterol 108 (90 Base) MCG/ACT inhaler Commonly known as: VENTOLIN HFA INHALE 1-2 PUFFS BY MOUTH EVERY 6 HOURS AS NEEDED FOR WHEEZE OR SHORTNESS OF BREATH   amLODipine 5 MG tablet Commonly known as: NORVASC Take 1 tablet by mouth daily.   Calcium Carb-Cholecalciferol 600-200 MG-UNIT Tabs Take 1 tablet by mouth daily.   cetirizine 10 MG tablet Commonly known as: ZYRTEC Take 10 mg by mouth every morning.   dorzolamide-timolol 2-0.5 % ophthalmic solution Commonly known as: COSOPT Place 1 drop into the right eye 2 (two) times daily.   Eliquis 5 MG Tabs tablet Generic drug: apixaban TAKE 1 TABLET BY MOUTH TWICE A DAY   ferrous sulfate 325 (65 FE) MG tablet Take 325 mg by mouth once a week. wednesday  Take once a week reported on: 01/01/2022   fluticasone 50 MCG/ACT nasal spray Commonly known as: FLONASE Place 1 spray into both nostrils 2 (two) times daily as needed for allergies.   fluticasone furoate-vilanterol 100-25 MCG/ACT Aepb Commonly known as: Breo Ellipta Inhale 1 puff into the lungs daily.   gabapentin 300 MG capsule Commonly known as: NEURONTIN Take 1 capsule (300 mg total) by mouth at bedtime.   guaiFENesin-dextromethorphan 100-10 MG/5ML syrup Commonly known as: ROBITUSSIN DM Take 10 mLs by mouth every 4 (four) hours as needed for cough.   latanoprost 0.005 % ophthalmic solution Commonly  known as: XALATAN Place 1 drop into both eyes at bedtime.   meclizine 12.5 MG tablet Commonly known as: ANTIVERT Take 1 tablet (12.5 mg total) by mouth 3 (three) times daily as needed for dizziness (Vertigo).   metoprolol succinate 25 MG 24 hr tablet Commonly known as: TOPROL-XL TAKE 1 TABLET (25 MG TOTAL) BY MOUTH DAILY.   multivitamin tablet Take 1 tablet by mouth every morning.   nitroGLYCERIN 0.4 MG SL tablet Commonly known as: NITROSTAT Place 1 tablet (0.4 mg total) under the tongue every 5 (five) minutes as needed for chest pain (Up to 3 doses in 15 minutes.).   ofloxacin 0.3 % OTIC solution Commonly known as: FLOXIN Place 10 drops into the right ear daily.   predniSONE 20 MG tablet Commonly known as: DELTASONE Take 2 tablets (40 mg total) by mouth daily with breakfast for 7 days. Start taking on: January 08, 2023   rosuvastatin 20 MG tablet Commonly known as: CRESTOR TAKE 1 TABLET BY MOUTH EVERY DAY        Follow-up Information     Buren Kos Rhue, DO. Schedule an appointment as soon as possible for a visit in 1 week(s).   Specialty: Family Medicine  Leonie Douglas, MD Follow up in 1 month(s).   Specialties: Vascular Surgery, Interventional Cardiology Contact information: [address] [place] [address] [phone]         Cyndie Chime, MD .   Specialty: Urology               Allergies  Allergen Reactions   Other Shortness Of Breath    Pt states she has asthma and seafood causes breathing problems.    Shellfish Allergy Shortness Of Breath    Pt states she has asthma and seafood causes breathing problems.   Iodine Other (See Comments)    REACTION: " UNSURE TOLD TO LIST AS ALLERGEN BY MD" REACTION: " UNSURE TOLD TO LIST AS ALLERGEN BY MD"   Latex Itching and Rash   Methocarbamol Other (See Comments)   Pravastatin Other (See Comments)   Adhesive [Tape] Rash    Consultations: Vascular surgery/palliative  care   Procedures/Studies: DG Chest Port 1 View  Result Date: 01/04/2023 CLINICAL DATA:  [id number] Fever [id number] EXAM: PORTABLE CHEST 1 VIEW COMPARISON:  CXR 01/03/23 FINDINGS: No pleural effusion. No pneumothorax. Normal cardiac and mediastinal contours. No focal airspace opacity. No radiographically apparent displaced rib fractures. Visualized upper abdomen is unremarkable. IMPRESSION: No focal airspace opacity Electronically Signed   By: Lorenza Cambridge M.D.   On: 01/04/2023 08:15   CT ABDOMEN PELVIS WO CONTRAST  Result Date: 01/03/2023 CLINICAL DATA:  Acute, non localized abdominal pain. EXAM: CT ABDOMEN AND PELVIS WITHOUT CONTRAST TECHNIQUE: Multidetector CT imaging of the abdomen and pelvis was performed following the standard protocol without IV contrast. RADIATION DOSE REDUCTION: This exam was performed according to the departmental dose-optimization program which includes automated exposure control, adjustment of the mA and/or kV according to patient size and/or use of iterative reconstruction technique. COMPARISON:  07/11/2007.  Chest CTA dated 05/06/2021. FINDINGS: Lower chest: Borderline enlarged heart. Small hiatal hernia. Mild linear scarring/atelectasis at both lung bases. Hepatobiliary: No focal liver abnormality is seen. No gallstones, gallbladder wall thickening, or biliary dilatation. Pancreas: Unremarkable. No pancreatic ductal dilatation or surrounding inflammatory changes. Spleen: Normal in size without focal abnormality. Adrenals/Urinary Tract: Normal appearing adrenal glands with the exception of a small amount of calcification again demonstrated in the right adrenal gland. A simple appearing left renal cyst is again demonstrated with a mean density of -2.8 Hounsfield units. Normal-appearing right kidney, ureters and urinary bladder. Stomach/Bowel: Small hiatal hernia. Prominent stool in the right and transverse colon. Normal-appearing appendix. Unremarkable small bowel.  Vascular/Lymphatic: Atheromatous arterial calcifications. These include dense calcifications at the origins of the celiac axis and superior mesenteric artery with approximately 80% stenosis of the origin of the celiac axis and proximally 60% stenosis of the origin of the superior mesenteric artery. No enlarged lymph nodes. Reproductive: Status post hysterectomy. No adnexal masses. Other: No abdominal wall hernia or abnormality. No abdominopelvic ascites. Musculoskeletal: Lumbar and lower thoracic spine degenerative changes. IMPRESSION: 1. No acute abnormality. 2. Prominent stool in the right and transverse colon. 3. Small hiatal hernia. 4. Borderline cardiomegaly. 5. 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 Electronically Signed   By: Beckie Salts M.D.   On: 01/03/2023 17:42   MM 3D SCREENING MAMMOGRAM BILATERAL BREAST  Result Date: 01/03/2023 CLINICAL DATA:  Screening. EXAM: DIGITAL SCREENING BILATERAL MAMMOGRAM WITH TOMOSYNTHESIS AND CAD TECHNIQUE: Bilateral screening digital craniocaudal and mediolateral oblique mammograms were obtained. Bilateral screening  digital breast tomosynthesis was performed. The images were evaluated with computer-aided detection. COMPARISON:  Previous exam(s). ACR Breast Density Category b: There are scattered areas of fibroglandular density. FINDINGS: There are no findings suspicious for malignancy. IMPRESSION: No mammographic evidence of malignancy. A result letter of this screening mammogram will be mailed directly to the patient. RECOMMENDATION: Screening mammogram in one year. (Code:SM-B-01Y) BI-RADS CATEGORY  1: Negative. Electronically Signed   By: Sherron Ales M.D.   On: 01/03/2023 17:07   DG Chest Port 1 View  Result Date: 01/03/2023 CLINICAL DATA:  Dizziness. EXAM: PORTABLE CHEST 1 VIEW COMPARISON:  Chest radiograph dated July 22, 2022.  FINDINGS: The heart size and mediastinal contours are within normal limits. Aortic calcification. No focal consolidation, pneumothorax, or sizable pleural effusion. No acute osseous abnormality. IMPRESSION: No active cardiopulmonary disease. Electronically Signed   By: Hart Robinsons M.D.   On: 01/03/2023 13:42   CT Head Wo Contrast  Result Date: 01/03/2023 CLINICAL DATA:  Minor head trauma EXAM: CT HEAD WITHOUT CONTRAST TECHNIQUE: Contiguous axial images were obtained from the base of the skull through the vertex without intravenous contrast. RADIATION DOSE REDUCTION: This exam was performed according to the departmental dose-optimization program which includes automated exposure control, adjustment of the mA and/or kV according to patient size and/or use of iterative reconstruction technique. COMPARISON:  05/31/2019 FINDINGS: Brain: No evidence of acute infarction, hemorrhage, hydrocephalus, extra-axial collection or mass lesion/mass effect. Generalized brain atrophy that is mild for age. Age normal white matter appearance Vascular: No hyperdense vessel or unexpected calcification. Skull: No acute or aggressive finding Sinuses/Orbits: Chronic sinusitis with generalized mucosal thickening. Prior endoscopic sinus surgery.No acute finding. IMPRESSION: No evidence of intracranial injury. Electronically Signed   By: Tiburcio Pea M.D.   On: 01/03/2023 13:19      Subjective: Patient seen and examined at bedside.  Wants to go home today.  Denies fever, vomiting or abdominal pain.   Discharge Exam: Vitals:   01/07/23 0607 01/07/23 0742  BP: (!) 160/60   Pulse: 76 76  Resp: 17 18  Temp: 98.4 F (36.9 C)   SpO2: 97% 97%    General: On room air.  No distress.  Elderly female lying in bed.  Looks chronically ill and deconditioned. respiratory: Decreased breath sounds at bases bilaterally with some crackles CVS: Currently rate controlled; S1-S2 heard  abdominal: Soft, nontender, slightly distended,  no organomegaly; normal bowel sounds are heard  extremities: Trace lower extremity edema; no clubbing.     The results of significant diagnostics from this hospitalization (including imaging, microbiology, ancillary and laboratory) are listed below for reference.     Microbiology: Recent Results (from the past 240 hour(s))  Blood Culture (routine x 2)     Status: None (Preliminary result)   Collection Time: 01/03/23 11:10 AM   Specimen: BLOOD RIGHT FOREARM  Result Value Ref Range Status   Specimen Description BLOOD RIGHT FOREARM  Final   Special Requests   Final    BOTTLES DRAWN AEROBIC AND ANAEROBIC Blood Culture adequate volume   Culture   Final    NO GROWTH 4 DAYS Performed at [facility], [address], [place], [place] [address]    Report Status PENDING  Incomplete  Blood Culture (routine x 2)     Status: None (Preliminary result)   Collection Time: 01/03/23 11:15 AM   Specimen: BLOOD RIGHT HAND  Result Value Ref Range Status   Specimen Description BLOOD RIGHT HAND  Final   Special  Requests   Final    BOTTLES DRAWN AEROBIC AND ANAEROBIC Blood Culture adequate volume   Culture   Final    NO GROWTH 4 DAYS Performed at [facility], [address] [place], [place] [address]    Report Status PENDING  Incomplete  Culture, blood (Routine X 2) w Reflex to ID Panel     Status: None (Preliminary result)   Collection Time: 01/04/23  8:17 AM   Specimen: BLOOD  Result Value Ref Range Status   Specimen Description BLOOD BLOOD RIGHT HAND  Final   Special Requests   Final    BOTTLES DRAWN AEROBIC AND ANAEROBIC Blood Culture adequate volume   Culture   Final    NO GROWTH 3 DAYS Performed at [facility], [address] [place], [place] [address]    Report Status PENDING  Incomplete  Culture, blood (Routine X 2) w Reflex to ID Panel     Status: None (Preliminary result)   Collection Time: 01/04/23  8:18 AM   Specimen: BLOOD  Result Value Ref Range Status    Specimen Description BLOOD BLOOD RIGHT HAND  Final   Special Requests   Final    BOTTLES DRAWN AEROBIC AND ANAEROBIC Blood Culture adequate volume   Culture   Final    NO GROWTH 3 DAYS Performed at [facility], [address], [place], [place] [address]    Report Status PENDING  Incomplete  Resp panel by RT-PCR (RSV, Flu A&B, Covid)     Status: Abnormal   Collection Time: 01/04/23  9:25 AM  Result Value Ref Range Status   SARS Coronavirus 2 by RT PCR POSITIVE (A) NEGATIVE Final   Influenza A by PCR NEGATIVE NEGATIVE Final   Influenza B by PCR NEGATIVE NEGATIVE Final    Comment: (NOTE) The Xpert Xpress SARS-CoV-2/FLU/RSV plus assay is intended as an aid in the diagnosis of influenza from Nasopharyngeal swab specimens and should not be used as a sole basis for treatment. Nasal washings and aspirates are unacceptable for Xpert Xpress SARS-CoV-2/FLU/RSV testing.  Fact Sheet for Patients: BloggerCourse.com  Fact Sheet for Healthcare Providers: SeriousBroker.it  This test is not yet approved or cleared by the [place] FDA and has been authorized for detection and/or diagnosis of SARS-CoV-2 by FDA under an Emergency Use Authorization (EUA). This EUA will remain in effect (meaning this test can be used) for the duration of the COVID-19 declaration under Section 564(b)(1) of the Act, 21 U.S.C. section 360bbb-3(b)(1), unless the authorization is terminated or revoked.     Resp Syncytial Virus by PCR NEGATIVE NEGATIVE Final    Comment: (NOTE) Fact Sheet for Patients: BloggerCourse.com  Fact Sheet for Healthcare Providers: SeriousBroker.it  This test is not yet approved or cleared by the [place] FDA and has been authorized for detection and/or diagnosis of SARS-CoV-2 by FDA under an Emergency Use Authorization (EUA). This EUA will remain in effect (meaning this test  can be used) for the duration of the COVID-19 declaration under Section 564(b)(1) of the Act, 21 U.S.C. section 360bbb-3(b)(1), unless the authorization is terminated or revoked.  Performed at [facility], [address] [place], [place] [address]      Labs: BNP (last 3 results) No results for input(s): "BNP" in the last 8760 hours. Basic Metabolic Panel: Recent Labs  Lab 01/03/23 1055 01/04/23 0604 01/04/23 0818 01/05/23 0551 01/06/23 0646  NA 135 136  --  137 138  K 3.5 3.1*  --  3.7 3.9  CL 103 99  --  107 108  CO2 23 24  --  22 21*  GLUCOSE 96 120*  --  104* 119*  BUN 13 8  --  13 14  CREATININE 0.97 1.08*  --  1.10* 0.98  CALCIUM 8.9 8.7*  --  8.1* 8.5*  MG  --   --  1.6* 2.1 1.9   Liver Function Tests: Recent Labs  Lab 01/03/23 1055 01/05/23 0551 01/06/23 0646  AST 43* 67* 58*  ALT 26 36 34  ALKPHOS 51 42 38  BILITOT 0.5 0.3 0.2*  PROT 6.5 5.8* 5.7*  ALBUMIN 3.7 2.8* 2.8*   No results for input(s): "LIPASE", "AMYLASE" in the last 168 hours. No results for input(s): "AMMONIA" in the last 168 hours. CBC: Recent Labs  Lab 01/03/23 1055 01/04/23 0604 01/05/23 0551  WBC 6.0 6.2 4.4  NEUTROABS 3.7  --  1.7  HGB 10.9* 12.3 10.7*  HCT 33.6* 37.8 32.3*  MCV 91.1 90.0 90.0  PLT 237 241 211   Cardiac Enzymes: No results for input(s): "CKTOTAL", "CKMB", "CKMBINDEX", "TROPONINI" in the last 168 hours. BNP: Invalid input(s): "POCBNP" CBG: No results for input(s): "GLUCAP" in the last 168 hours. D-Dimer Recent Labs    01/05/23 0551  DDIMER 0.46   Hgb A1c No results for input(s): "HGBA1C" in the last 72 hours. Lipid Profile No results for input(s): "CHOL", "HDL", "LDLCALC", "TRIG", "CHOLHDL", "LDLDIRECT" in the last 72 hours. Thyroid function studies No results for input(s): "TSH", "T4TOTAL", "T3FREE", "THYROIDAB" in the last 72 hours.  Invalid input(s): "FREET3" Anemia work up Recent Labs    01/05/23 0551  FERRITIN 122   Urinalysis     Component Value Date/Time   COLORURINE STRAW (A) 01/04/2023 2217   APPEARANCEUR CLEAR 01/04/2023 2217   LABSPEC 1.004 (L) 01/04/2023 2217   PHURINE 6.0 01/04/2023 2217   GLUCOSEU NEGATIVE 01/04/2023 2217   HGBUR LARGE (A) 01/04/2023 2217   BILIRUBINUR NEGATIVE 01/04/2023 2217   BILIRUBINUR NEG 07/25/2011 1455   KETONESUR NEGATIVE 01/04/2023 2217   PROTEINUR NEGATIVE 01/04/2023 2217   UROBILINOGEN negative 07/25/2011 1455   NITRITE NEGATIVE 01/04/2023 2217   LEUKOCYTESUR NEGATIVE 01/04/2023 2217   Sepsis Labs Recent Labs  Lab 01/03/23 1055 01/04/23 0604 01/05/23 0551  WBC 6.0 6.2 4.4   Microbiology Recent Results (from the past 240 hour(s))  Blood Culture (routine x 2)     Status: None (Preliminary result)   Collection Time: 01/03/23 11:10 AM   Specimen: BLOOD RIGHT FOREARM  Result Value Ref Range Status   Specimen Description BLOOD RIGHT FOREARM  Final   Special Requests   Final    BOTTLES DRAWN AEROBIC AND ANAEROBIC Blood Culture adequate volume   Culture   Final    NO GROWTH 4 DAYS Performed at [facility], [address], [place], [place] [address]    Report Status PENDING  Incomplete  Blood Culture (routine x 2)     Status: None (Preliminary result)   Collection Time: 01/03/23 11:15 AM   Specimen: BLOOD RIGHT HAND  Result Value Ref Range Status   Specimen Description BLOOD RIGHT HAND  Final   Special Requests   Final    BOTTLES DRAWN AEROBIC AND ANAEROBIC Blood Culture adequate volume   Culture   Final    NO GROWTH 4 DAYS Performed at [facility], [address] [place], [place] [address]    Report Status PENDING  Incomplete  Culture, blood (Routine X  2) w Reflex to ID Panel     Status: None (Preliminary result)   Collection Time: 01/04/23  8:17 AM   Specimen: BLOOD  Result Value Ref Range Status   Specimen Description BLOOD BLOOD RIGHT HAND  Final   Special Requests   Final    BOTTLES DRAWN AEROBIC AND ANAEROBIC Blood Culture adequate  volume   Culture   Final    NO GROWTH 3 DAYS Performed at [facility], [address], [place], [place] [address]    Report Status PENDING  Incomplete  Culture, blood (Routine X 2) w Reflex to ID Panel     Status: None (Preliminary result)   Collection Time: 01/04/23  8:18 AM   Specimen: BLOOD  Result Value Ref Range Status   Specimen Description BLOOD BLOOD RIGHT HAND  Final   Special Requests   Final    BOTTLES DRAWN AEROBIC AND ANAEROBIC Blood Culture adequate volume   Culture   Final    NO GROWTH 3 DAYS Performed at [facility], [address] [place], [place] [address]    Report Status PENDING  Incomplete  Resp panel by RT-PCR (RSV, Flu A&B, Covid)     Status: Abnormal   Collection Time: 01/04/23  9:25 AM  Result Value Ref Range Status   SARS Coronavirus 2 by RT PCR POSITIVE (A) NEGATIVE Final   Influenza A by PCR NEGATIVE NEGATIVE Final   Influenza B by PCR NEGATIVE NEGATIVE Final    Comment: (NOTE) The Xpert Xpress SARS-CoV-2/FLU/RSV plus assay is intended as an aid in the diagnosis of influenza from Nasopharyngeal swab specimens and should not be used as a sole basis for treatment. Nasal washings and aspirates are unacceptable for Xpert Xpress SARS-CoV-2/FLU/RSV testing.  Fact Sheet for Patients: BloggerCourse.com  Fact Sheet for Healthcare Providers: SeriousBroker.it  This test is not yet approved or cleared by the [place] FDA and has been authorized for detection and/or diagnosis of SARS-CoV-2 by FDA under an Emergency Use Authorization (EUA). This EUA will remain in effect (meaning this test can be used) for the duration of the COVID-19 declaration under Section 564(b)(1) of the Act, 21 U.S.C. section 360bbb-3(b)(1), unless the authorization is terminated or revoked.     Resp Syncytial Virus by PCR NEGATIVE NEGATIVE Final    Comment: (NOTE) Fact Sheet for  Patients: BloggerCourse.com  Fact Sheet for Healthcare Providers: SeriousBroker.it  This test is not yet approved or cleared by the [place] FDA and has been authorized for detection and/or diagnosis of SARS-CoV-2 by FDA under an Emergency Use Authorization (EUA). This EUA will remain in effect (meaning this test can be used) for the duration of the COVID-19 declaration under Section 564(b)(1) of the Act, 21 U.S.C. section 360bbb-3(b)(1), unless the authorization is terminated or revoked.  Performed at [facility], [address], [place], [place] [address]      Time coordinating discharge: 35 minutes  SIGNED:   Glade Lloyd, MD  [facility] 01/07/2023, 11:07 AM

## 2023-01-07 NOTE — Discharge Instructions (Signed)

## 2023-01-07 NOTE — Plan of Care (Signed)

## 2023-01-07 NOTE — Progress Notes (Signed)
   Palliative Medicine Inpatient Follow Up Note HPI: 89 y.o. female with medical history significant for PAF on Eliquis, asthma, HTN, HLD presented with lightheadedness, loss of balance and fall to the floor without loss of consciousness.  She apparently was on the floor for about 2 hours before she called family for assistance.     Palliative care asked to get involved to assist with goals of care conversations.   Today's Discussion 01/07/2023  *Please note that this is a verbal dictation therefore any spelling or grammatical errors are due to the "[employer]" system interpretation.  Chart reviewed inclusive of vital signs, progress notes, laboratory results, and diagnostic images.   I met with Anne [name] at bedside this morning. She is washing her face.She shares that she feels much better and that her cough expectorant is now clear. She expresses the hope to discharge in the oncoming day(s).   Created space and opportunity for patient to explore thoughts feelings and fears regarding current medical situation. She feels like she delayed getting her vaccines and that contributed to her present illness. I was able to provide education in regards to her providing self care first and foremost. She shares that she often is intermingled in helping her family which deters from her ability to take care of her self.  In regards to code status, Anne [name] will speak further to her PCP. She plans to review the MOST form in advance.  Questions and concerns addressed/Palliative Support Provided.   Objective Assessment: Vital Signs Vitals:   01/07/23 0607 01/07/23 0742  BP: (!) 160/60   Pulse: 76 76  Resp: 17 18  Temp: 98.4 F (36.9 C)   SpO2: 97% 97%    Intake/Output Summary (Last 24 hours) at 01/07/2023 1113 Last data filed at 01/06/2023 2133 Gross per 24 hour  Intake --  Output 400 ml  Net -400 ml   Last Weight  Most recent update: 01/03/2023 10:37 AM    Weight  67 kg (147 lb 11.3 oz)             Gen:  Elderly AA F in NAD HEENT: moist mucous membranes CV: Regular rate and rhythm  PULM:Breathing is even and nonlabored ABD: soft/nontender  EXT: No edema  Neuro: Alert and oriented x3   SUMMARY OF RECOMMENDATIONS   Full Code --> Provided Hard Choices Book. Patient will discuss this further with her PCP. A MOST form was provided.    Continue current care   Plan for discharge to home today  Time Spent: 35 ______________________________________________________________________________________ Lamarr Lulas [facility] Palliative Medicine Team Team Cell Phone: [phone] Please utilize secure chat with additional questions, if there is no response within 30 minutes please call the above phone number  Palliative Medicine Team providers are available by phone from 7am to 7pm daily and can be reached through the team cell phone.  Should this patient require assistance outside of these hours, please call the patient's attending physician.

## 2023-01-07 NOTE — Progress Notes (Signed)
    Durable Medical Equipment  (From admission, onward)           Start     Ordered   01/07/23 1211  For home use only DME Bedside commode  Once       Comments: Patient needs 3:1 provided since patient is unable to ambulate to bathroom - will need at bedside in same room in the home to avoid fall potential  Question Answer Comment  Patient needs a bedside commode to treat with the following condition Syncope   Patient needs a bedside commode to treat with the following condition COVID-19      01/07/23 1211

## 2023-01-08 LAB — CULTURE, BLOOD (ROUTINE X 2)
Culture: NO GROWTH
Culture: NO GROWTH
Special Requests: ADEQUATE
Special Requests: ADEQUATE

## 2023-01-09 DIAGNOSIS — I480 Paroxysmal atrial fibrillation: Secondary | ICD-10-CM | POA: Diagnosis not present

## 2023-01-09 DIAGNOSIS — K551 Chronic vascular disorders of intestine: Secondary | ICD-10-CM | POA: Diagnosis not present

## 2023-01-09 DIAGNOSIS — J45909 Unspecified asthma, uncomplicated: Secondary | ICD-10-CM | POA: Diagnosis not present

## 2023-01-09 DIAGNOSIS — E785 Hyperlipidemia, unspecified: Secondary | ICD-10-CM | POA: Diagnosis not present

## 2023-01-09 DIAGNOSIS — I774 Celiac artery compression syndrome: Secondary | ICD-10-CM | POA: Diagnosis not present

## 2023-01-09 DIAGNOSIS — E876 Hypokalemia: Secondary | ICD-10-CM | POA: Diagnosis not present

## 2023-01-09 DIAGNOSIS — I10 Essential (primary) hypertension: Secondary | ICD-10-CM | POA: Diagnosis not present

## 2023-01-09 DIAGNOSIS — U071 COVID-19: Secondary | ICD-10-CM | POA: Diagnosis not present

## 2023-01-09 LAB — CULTURE, BLOOD (ROUTINE X 2)
Culture: NO GROWTH
Culture: NO GROWTH
Special Requests: ADEQUATE
Special Requests: ADEQUATE

## 2023-01-15 DIAGNOSIS — K551 Chronic vascular disorders of intestine: Secondary | ICD-10-CM | POA: Diagnosis not present

## 2023-01-15 DIAGNOSIS — E785 Hyperlipidemia, unspecified: Secondary | ICD-10-CM | POA: Diagnosis not present

## 2023-01-15 DIAGNOSIS — I480 Paroxysmal atrial fibrillation: Secondary | ICD-10-CM | POA: Diagnosis not present

## 2023-01-15 DIAGNOSIS — J45909 Unspecified asthma, uncomplicated: Secondary | ICD-10-CM | POA: Diagnosis not present

## 2023-01-15 DIAGNOSIS — I774 Celiac artery compression syndrome: Secondary | ICD-10-CM | POA: Diagnosis not present

## 2023-01-15 DIAGNOSIS — U071 COVID-19: Secondary | ICD-10-CM | POA: Diagnosis not present

## 2023-01-15 DIAGNOSIS — E876 Hypokalemia: Secondary | ICD-10-CM | POA: Diagnosis not present

## 2023-01-15 DIAGNOSIS — I10 Essential (primary) hypertension: Secondary | ICD-10-CM | POA: Diagnosis not present

## 2023-01-18 DIAGNOSIS — U071 COVID-19: Secondary | ICD-10-CM | POA: Diagnosis not present

## 2023-01-18 DIAGNOSIS — J45909 Unspecified asthma, uncomplicated: Secondary | ICD-10-CM | POA: Diagnosis not present

## 2023-01-18 DIAGNOSIS — I10 Essential (primary) hypertension: Secondary | ICD-10-CM | POA: Diagnosis not present

## 2023-01-18 DIAGNOSIS — I480 Paroxysmal atrial fibrillation: Secondary | ICD-10-CM | POA: Diagnosis not present

## 2023-01-18 DIAGNOSIS — I774 Celiac artery compression syndrome: Secondary | ICD-10-CM | POA: Diagnosis not present

## 2023-01-18 DIAGNOSIS — E876 Hypokalemia: Secondary | ICD-10-CM | POA: Diagnosis not present

## 2023-01-18 DIAGNOSIS — E785 Hyperlipidemia, unspecified: Secondary | ICD-10-CM | POA: Diagnosis not present

## 2023-01-18 DIAGNOSIS — K551 Chronic vascular disorders of intestine: Secondary | ICD-10-CM | POA: Diagnosis not present

## 2023-01-22 DIAGNOSIS — E876 Hypokalemia: Secondary | ICD-10-CM | POA: Diagnosis not present

## 2023-01-22 DIAGNOSIS — I10 Essential (primary) hypertension: Secondary | ICD-10-CM | POA: Diagnosis not present

## 2023-01-22 DIAGNOSIS — I480 Paroxysmal atrial fibrillation: Secondary | ICD-10-CM | POA: Diagnosis not present

## 2023-01-22 DIAGNOSIS — J45909 Unspecified asthma, uncomplicated: Secondary | ICD-10-CM | POA: Diagnosis not present

## 2023-01-22 DIAGNOSIS — U071 COVID-19: Secondary | ICD-10-CM | POA: Diagnosis not present

## 2023-01-22 DIAGNOSIS — K551 Chronic vascular disorders of intestine: Secondary | ICD-10-CM | POA: Diagnosis not present

## 2023-01-22 DIAGNOSIS — I774 Celiac artery compression syndrome: Secondary | ICD-10-CM | POA: Diagnosis not present

## 2023-01-22 DIAGNOSIS — E785 Hyperlipidemia, unspecified: Secondary | ICD-10-CM | POA: Diagnosis not present

## 2023-01-24 DIAGNOSIS — E785 Hyperlipidemia, unspecified: Secondary | ICD-10-CM | POA: Diagnosis not present

## 2023-01-24 DIAGNOSIS — I774 Celiac artery compression syndrome: Secondary | ICD-10-CM | POA: Diagnosis not present

## 2023-01-24 DIAGNOSIS — J45909 Unspecified asthma, uncomplicated: Secondary | ICD-10-CM | POA: Diagnosis not present

## 2023-01-24 DIAGNOSIS — E876 Hypokalemia: Secondary | ICD-10-CM | POA: Diagnosis not present

## 2023-01-24 DIAGNOSIS — K551 Chronic vascular disorders of intestine: Secondary | ICD-10-CM | POA: Diagnosis not present

## 2023-01-24 DIAGNOSIS — I480 Paroxysmal atrial fibrillation: Secondary | ICD-10-CM | POA: Diagnosis not present

## 2023-01-24 DIAGNOSIS — U071 COVID-19: Secondary | ICD-10-CM | POA: Diagnosis not present

## 2023-01-24 DIAGNOSIS — I10 Essential (primary) hypertension: Secondary | ICD-10-CM | POA: Diagnosis not present

## 2023-01-28 DIAGNOSIS — E785 Hyperlipidemia, unspecified: Secondary | ICD-10-CM | POA: Diagnosis not present

## 2023-01-28 DIAGNOSIS — E876 Hypokalemia: Secondary | ICD-10-CM | POA: Diagnosis not present

## 2023-01-28 DIAGNOSIS — I480 Paroxysmal atrial fibrillation: Secondary | ICD-10-CM | POA: Diagnosis not present

## 2023-01-28 DIAGNOSIS — I774 Celiac artery compression syndrome: Secondary | ICD-10-CM | POA: Diagnosis not present

## 2023-01-28 DIAGNOSIS — J45909 Unspecified asthma, uncomplicated: Secondary | ICD-10-CM | POA: Diagnosis not present

## 2023-01-28 DIAGNOSIS — I10 Essential (primary) hypertension: Secondary | ICD-10-CM | POA: Diagnosis not present

## 2023-01-28 DIAGNOSIS — U071 COVID-19: Secondary | ICD-10-CM | POA: Diagnosis not present

## 2023-01-28 DIAGNOSIS — K551 Chronic vascular disorders of intestine: Secondary | ICD-10-CM | POA: Diagnosis not present

## 2023-01-31 ENCOUNTER — Encounter: Payer: Self-pay | Admitting: Cardiology

## 2023-02-01 ENCOUNTER — Other Ambulatory Visit: Payer: Self-pay

## 2023-02-01 MED ORDER — ROSUVASTATIN CALCIUM 20 MG PO TABS: 20.0000 mg | ORAL_TABLET | Freq: Every day | ORAL | 2 refills | Status: DC

## 2023-02-02 DIAGNOSIS — E785 Hyperlipidemia, unspecified: Secondary | ICD-10-CM | POA: Diagnosis not present

## 2023-02-02 DIAGNOSIS — I774 Celiac artery compression syndrome: Secondary | ICD-10-CM | POA: Diagnosis not present

## 2023-02-02 DIAGNOSIS — I10 Essential (primary) hypertension: Secondary | ICD-10-CM | POA: Diagnosis not present

## 2023-02-02 DIAGNOSIS — E876 Hypokalemia: Secondary | ICD-10-CM | POA: Diagnosis not present

## 2023-02-02 DIAGNOSIS — U071 COVID-19: Secondary | ICD-10-CM | POA: Diagnosis not present

## 2023-02-02 DIAGNOSIS — I480 Paroxysmal atrial fibrillation: Secondary | ICD-10-CM | POA: Diagnosis not present

## 2023-02-02 DIAGNOSIS — K551 Chronic vascular disorders of intestine: Secondary | ICD-10-CM | POA: Diagnosis not present

## 2023-02-02 DIAGNOSIS — J45909 Unspecified asthma, uncomplicated: Secondary | ICD-10-CM | POA: Diagnosis not present

## 2023-02-04 DIAGNOSIS — N1831 Chronic kidney disease, stage 3a: Secondary | ICD-10-CM | POA: Diagnosis not present

## 2023-02-04 DIAGNOSIS — J449 Chronic obstructive pulmonary disease, unspecified: Secondary | ICD-10-CM | POA: Diagnosis not present

## 2023-02-04 DIAGNOSIS — I129 Hypertensive chronic kidney disease with stage 1 through stage 4 chronic kidney disease, or unspecified chronic kidney disease: Secondary | ICD-10-CM | POA: Diagnosis not present

## 2023-02-04 DIAGNOSIS — Z23 Encounter for immunization: Secondary | ICD-10-CM | POA: Diagnosis not present

## 2023-02-04 DIAGNOSIS — D649 Anemia, unspecified: Secondary | ICD-10-CM | POA: Diagnosis not present

## 2023-02-04 DIAGNOSIS — E7800 Pure hypercholesterolemia, unspecified: Secondary | ICD-10-CM | POA: Diagnosis not present

## 2023-02-04 DIAGNOSIS — Z0000 Encounter for general adult medical examination without abnormal findings: Secondary | ICD-10-CM | POA: Diagnosis not present

## 2023-02-04 DIAGNOSIS — R748 Abnormal levels of other serum enzymes: Secondary | ICD-10-CM | POA: Diagnosis not present

## 2023-02-04 DIAGNOSIS — I700 Atherosclerosis of aorta: Secondary | ICD-10-CM | POA: Diagnosis not present

## 2023-02-06 DIAGNOSIS — E876 Hypokalemia: Secondary | ICD-10-CM | POA: Diagnosis not present

## 2023-02-06 DIAGNOSIS — U071 COVID-19: Secondary | ICD-10-CM | POA: Diagnosis not present

## 2023-02-06 DIAGNOSIS — J45909 Unspecified asthma, uncomplicated: Secondary | ICD-10-CM | POA: Diagnosis not present

## 2023-02-06 DIAGNOSIS — K551 Chronic vascular disorders of intestine: Secondary | ICD-10-CM | POA: Diagnosis not present

## 2023-02-06 DIAGNOSIS — E785 Hyperlipidemia, unspecified: Secondary | ICD-10-CM | POA: Diagnosis not present

## 2023-02-06 DIAGNOSIS — I480 Paroxysmal atrial fibrillation: Secondary | ICD-10-CM | POA: Diagnosis not present

## 2023-02-06 DIAGNOSIS — I10 Essential (primary) hypertension: Secondary | ICD-10-CM | POA: Diagnosis not present

## 2023-02-06 DIAGNOSIS — I774 Celiac artery compression syndrome: Secondary | ICD-10-CM | POA: Diagnosis not present

## 2023-02-11 DIAGNOSIS — U071 COVID-19: Secondary | ICD-10-CM | POA: Diagnosis not present

## 2023-02-11 DIAGNOSIS — I480 Paroxysmal atrial fibrillation: Secondary | ICD-10-CM | POA: Diagnosis not present

## 2023-02-11 DIAGNOSIS — J45909 Unspecified asthma, uncomplicated: Secondary | ICD-10-CM | POA: Diagnosis not present

## 2023-02-11 DIAGNOSIS — E876 Hypokalemia: Secondary | ICD-10-CM | POA: Diagnosis not present

## 2023-02-11 DIAGNOSIS — I774 Celiac artery compression syndrome: Secondary | ICD-10-CM | POA: Diagnosis not present

## 2023-02-11 DIAGNOSIS — E785 Hyperlipidemia, unspecified: Secondary | ICD-10-CM | POA: Diagnosis not present

## 2023-02-11 DIAGNOSIS — K551 Chronic vascular disorders of intestine: Secondary | ICD-10-CM | POA: Diagnosis not present

## 2023-02-11 DIAGNOSIS — I10 Essential (primary) hypertension: Secondary | ICD-10-CM | POA: Diagnosis not present

## 2023-02-13 NOTE — Progress Notes (Unsigned)
Cardiology Office Note    Patient Name: Anne [name] Date of Encounter: 02/13/2023  Primary Care Provider:  Thana Ates, MD Primary Cardiologist:  Donato Schultz, MD Primary Electrophysiologist: None   Past Medical History    Past Medical History:  Diagnosis Date   Allergic rhinitis    Arthritis    Asthma    Atypical chest pain    Collapse of right lung    Dyslipidemia    Gastroesophageal reflux disease    Glaucoma    Hematuria    Hyperlipidemia    Lung nodules    PVC's (premature ventricular contractions) 02/21/2021   Sinus bradycardia 02/21/2021    History of Present Illness   Anne [name] is a 89 y.o. female with PMH of HLD, HTN, atypical chest pain, GERD, asthma, anxiety, COPD, lung nodules who presents today for posthospital follow-up.  Ms. Birchard was diagnosed with atrial fibrillation on 07/22/2022 presenting to [facility], ED with shortness of breath and found to be in AF with RVR.  She was on prednisone prior to developing AF and converted spontaneously to sinus rhythm hospital stay.  She was discharged with Toprol-XL 25 mg Eliquis 5 mg twice daily was provided in event monitor to evaluate AF burden that showed fibrillation with 13.1% SVT ectopy.  She was seen in follow-up on 08/30/2022 and agreed with conservative with no new medications at that time.  She was seen by Dr. Anne Fu on 6//24 for visit.  She was currently in sinus rhythm in concerns and continued on current medications at that time.  She was seen in the ED on 01/03/2023 plaint of dizziness with lightheadedness and fall without loss of consciousness.  She was on the floor for 2 hours before she called family for assistance.  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  She also tested positive for COVID and was kept in isolation with IV  Solu-Medrol.  She was discharged home instead of skilled nursing facility per patient's request.   During today's visit the patient reports*** .  Patient denies chest pain, palpitations, dyspnea, PND, orthopnea, nausea, vomiting, dizziness, syncope, edema, weight gain, or early satiety.  ***Notes: -Last ischemic evaluation: -Last echo: -Interim ED visits: Review of Systems  Please see the history of present illness.    All other systems reviewed and are otherwise negative except as noted above.  Physical Exam    Wt Readings from Last 3 Encounters:  01/03/23 147 lb 11.3 oz (67 kg)  12/19/22 149 lb 6.4 oz (67.8 kg)  11/23/22 149 lb 9.6 oz (67.9 kg)   PI:RJJOA were no vitals filed for this visit.,There is no height or weight on file to calculate BMI. GEN: Well nourished, well developed in no acute distress Neck: No JVD; No carotid bruits Pulmonary: Clear to auscultation without rales, wheezing or rhonchi  Cardiovascular: Normal rate. Regular rhythm. Normal S1. Normal S2.   Murmurs: There is no murmur.  ABDOMEN: Soft, non-tender, non-distended EXTREMITIES:  No edema; No deformity   EKG/LABS/ Recent Cardiac Studies   ECG personally reviewed by me today - ***  Risk Assessment/Calculations:   {Does this patient have ATRIAL FIBRILLATION?:[id number]}      Lab Results  Component Value Date   WBC 4.4 01/05/2023   HGB 10.7 (L) 01/05/2023   HCT 32.3 (L) 01/05/2023  MCV 90.0 01/05/2023   PLT 211 01/05/2023   Lab Results  Component Value Date   CREATININE 0.98 01/06/2023   BUN 14 01/06/2023   NA 138 01/06/2023   K 3.9 01/06/2023   CL 108 01/06/2023   CO2 21 (L) 01/06/2023   Lab Results  Component Value Date   CHOL 154 12/12/2021   HDL 61 12/12/2021   LDLCALC 81 12/12/2021   TRIG 60 12/12/2021   CHOLHDL 2.5 12/12/2021    No results found for: "HGBA1C" Assessment & Plan    1.  Paroxysmal atrial fibrillation: -  2.Nonobstructive CAD: -Coronary CT completed 2022  showing no significant stenosis and calcium score of 650. -Today patient reports   3.Dyspnea on exertion: -2D echo repeated with normal EF and moderate TV regurgitation and MV regurgitation  4. Essential hypertension: -Patient's blood pressure today was   5.Hyperlipidemia: -Patient's last LDL was 81 on 12/12/2021 -Continue Crestor 20 mg daily      Disposition: Follow-up with Donato Schultz, MD or APP in *** months {Are you ordering a CV Procedure (e.g. stress test, cath, DCCV, TEE, etc)?   Press F2        :[id number]}   Signed, Napoleon Form, Leodis Rains, NP 02/13/2023, 7:03 PM [facility]

## 2023-02-14 ENCOUNTER — Ambulatory Visit (INDEPENDENT_AMBULATORY_CARE_PROVIDER_SITE_OTHER): Payer: Medicare PPO

## 2023-02-14 ENCOUNTER — Other Ambulatory Visit: Payer: Self-pay | Admitting: Nurse Practitioner

## 2023-02-14 ENCOUNTER — Ambulatory Visit: Payer: Medicare PPO | Attending: Nurse Practitioner | Admitting: Nurse Practitioner

## 2023-02-14 ENCOUNTER — Encounter: Payer: Self-pay | Admitting: Nurse Practitioner

## 2023-02-14 VITALS — BP 138/62 | HR 65 | Ht 67.0 in | Wt 148.6 lb

## 2023-02-14 DIAGNOSIS — E785 Hyperlipidemia, unspecified: Secondary | ICD-10-CM | POA: Diagnosis not present

## 2023-02-14 DIAGNOSIS — I480 Paroxysmal atrial fibrillation: Secondary | ICD-10-CM | POA: Diagnosis not present

## 2023-02-14 DIAGNOSIS — I10 Essential (primary) hypertension: Secondary | ICD-10-CM

## 2023-02-14 DIAGNOSIS — I2510 Atherosclerotic heart disease of native coronary artery without angina pectoris: Secondary | ICD-10-CM

## 2023-02-14 DIAGNOSIS — R0609 Other forms of dyspnea: Secondary | ICD-10-CM

## 2023-02-14 DIAGNOSIS — R42 Dizziness and giddiness: Secondary | ICD-10-CM | POA: Diagnosis not present

## 2023-02-14 NOTE — Patient Instructions (Addendum)
Medication Instructions:  Your physician recommends that you continue on your current medications as directed. Please refer to the Current Medication list given to you today. *If you need a refill on your cardiac medications before your next appointment, please call your pharmacy*   Lab Work: None ordered   Testing/Procedures: ZIO XT- Long Term Monitor Instructions  Your physician has requested you wear a ZIO patch monitor for 14 days.  This is a single patch monitor. [employer] supplies one patch monitor per enrollment. Additional stickers are not available. Please do not apply patch if you will be having a Nuclear Stress Test,  Echocardiogram, Cardiac CT, MRI, or Chest Xray during the period you would be wearing the  monitor. The patch cannot be worn during these tests. You cannot remove and re-apply the  ZIO XT patch monitor.  Your ZIO patch monitor will be mailed 3 day USPS to your address on file. It may take 3-5 days  to receive your monitor after you have been enrolled.  Once you have received your monitor, please review the enclosed instructions. Your monitor  has already been registered assigning a specific monitor serial # to you.  Billing and Patient Assistance Program Information  We have supplied [employer] with any of your insurance information on file for billing purposes. [employer] offers a sliding scale Patient Assistance Program for patients that do not have  insurance, or whose insurance does not completely cover the cost of the ZIO monitor.  You must apply for the Patient Assistance Program to qualify for this discounted rate.  To apply, please call [employer] at [phone], select option 4, select option 2, ask to apply for  Patient Assistance Program. Meredeth Ide will ask your household income, and how many people  are in your household. They will quote your out-of-pocket cost based on that information.  [employer] will also be able to set up a 40-month, interest-free payment  plan if needed.  Applying the monitor   Shave hair from upper left chest.  Hold abrader disc by orange tab. Rub abrader in 40 strokes over the upper left chest as  indicated in your monitor instructions.  Clean area with 4 enclosed alcohol pads. Let dry.  Apply patch as indicated in monitor instructions. Patch will be placed under collarbone on left  side of chest with arrow pointing upward.  Rub patch adhesive wings for 2 minutes. Remove white label marked "1". Remove the white  label marked "2". Rub patch adhesive wings for 2 additional minutes.  While looking in a mirror, press and release button in center of patch. A small green light will  flash 3-4 times. This will be your only indicator that the monitor has been turned on.  Do not shower for the first 24 hours. You may shower after the first 24 hours.  Press the button if you feel a symptom. You will hear a small click. Record Date, Time and  Symptom in the Patient Logbook.  When you are ready to remove the patch, follow instructions on the last 2 pages of Patient  Logbook. Stick patch monitor onto the last page of Patient Logbook.  Place Patient Logbook in the blue and white box. Use locking tab on box and tape box closed  securely. The blue and white box has prepaid postage on it. Please place it in the mailbox as  soon as possible. Your physician should have your test results approximately 7 days after the  monitor has been mailed back to [facility].  Call [facility] Customer Care at [phone] if you have questions regarding  your ZIO XT patch monitor. Call them immediately if you see an orange light blinking on your  monitor.  If your monitor falls off in less than 4 days, contact our Monitor department at [phone].  If your monitor becomes loose or falls off after 4 days call [employer] at [phone] for  suggestions on securing your monitor   Follow-Up: At [facility], you and your health needs  are our priority.  As part of our continuing mission to provide you with exceptional heart care, we have created designated Provider Care Teams.  These Care Teams include your primary Cardiologist (physician) and Advanced Practice Providers (APPs -  Physician Assistants and Nurse Practitioners) who all work together to provide you with the care you need, when you need it.  We recommend signing up for the patient portal called "MyChart".  Sign up information is provided on this After Visit Summary.  MyChart is used to connect with patients for Virtual Visits (Telemedicine).  Patients are able to view lab/test results, encounter notes, upcoming appointments, etc.  Non-urgent messages can be sent to your provider as well.   To learn more about what you can do with MyChart, go to [email or web address].    Your next appointment:   6 month(s)  Provider:   Donato Schultz, MD     Other Instructions Heart-Healthy Eating Plan Eating a healthy diet is important for the health of your heart. A heart-healthy eating plan includes: Eating less unhealthy fats. Eating more healthy fats. Eating less salt in your food. Salt is also called sodium. Making other changes in your diet. Talk with your doctor or a diet specialist (dietitian) to create an eating plan that is right for you. What is my plan? Your doctor may recommend an eating plan that includes: Total fat: ______% or less of total calories a day. Saturated fat: ______% or less of total calories a day. Cholesterol: less than _________mg a day. Sodium: less than _________mg a day. What are tips for following this plan? Cooking Avoid frying your food. Try to bake, boil, grill, or broil it instead. You can also reduce fat by: Removing the skin from poultry. Removing all visible fats from meats. Steaming vegetables in water or broth. Meal planning  At meals, divide your plate into four equal parts: Fill one-half of your plate with vegetables and  green salads. Fill one-fourth of your plate with whole grains. Fill one-fourth of your plate with lean protein foods. Eat 2-4 cups of vegetables per day. One cup of vegetables is: 1 cup (91 g) broccoli or cauliflower florets. 2 medium carrots. 1 large bell pepper. 1 large sweet potato. 1 large tomato. 1 medium white potato. 2 cups (150 g) raw leafy greens. Eat 1-2 cups of fruit per day. One cup of fruit is: 1 small apple 1 large banana 1 cup (237 g) mixed fruit, 1 large orange,  cup (82 g) dried fruit, 1 cup (240 mL) 100% fruit juice. Eat more foods that have soluble fiber. These are apples, broccoli, carrots, beans, peas, and barley. Try to get 20-30 g of fiber per day. Eat 4-5 servings of nuts, legumes, and seeds per week: 1 serving of dried beans or legumes equals  cup (90 g) cooked. 1 serving of nuts is  oz (12 almonds, 24 pistachios, or 7 walnut halves). 1 serving of seeds equals  oz (8 g). General information Eat more home-cooked food. Eat less  restaurant, buffet, and fast food. Limit or avoid alcohol. Limit foods that are high in starch and sugar. Avoid fried foods. Lose weight if you are overweight. Keep track of how much salt (sodium) you eat. This is important if you have high blood pressure. Ask your doctor to tell you more about this. Try to add vegetarian meals each week. Fats Choose healthy fats. These include olive oil and canola oil, flaxseeds, walnuts, almonds, and seeds. Eat more omega-3 fats. These include salmon, mackerel, sardines, tuna, flaxseed oil, and ground flaxseeds. Try to eat fish at least 2 times each week. Check food labels. Avoid foods with trans fats or high amounts of saturated fat. Limit saturated fats. These are often found in animal products, such as meats, butter, and cream. These are also found in plant foods, such as palm oil, palm kernel oil, and coconut oil. Avoid foods with partially hydrogenated oils in them. These have trans  fats. Examples are stick margarine, some tub margarines, cookies, crackers, and other baked goods. What foods should I eat? Fruits All fresh, canned (in natural juice), or frozen fruits. Vegetables Fresh or frozen vegetables (raw, steamed, roasted, or grilled). Green salads. Grains Most grains. Choose whole wheat and whole grains most of the time. Rice and pasta, including brown rice and pastas made with whole wheat. Meats and other proteins Lean, well-trimmed beef, veal, pork, and lamb. Chicken and [place] without skin. All fish and shellfish. Wild duck, rabbit, pheasant, and venison. Egg whites or low-cholesterol egg substitutes. Dried beans, peas, lentils, and tofu. Seeds and most nuts. Dairy Low-fat or nonfat cheeses, including ricotta and mozzarella. Skim or 1% milk that is liquid, powdered, or evaporated. Buttermilk that is made with low-fat milk. Nonfat or low-fat yogurt. Fats and oils Non-hydrogenated (trans-free) margarines. Vegetable oils, including soybean, sesame, sunflower, olive, peanut, safflower, corn, canola, and cottonseed. Salad dressings or mayonnaise made with a vegetable oil. Beverages Mineral water. Coffee and tea. Diet carbonated beverages. Sweets and desserts Sherbet, gelatin, and fruit ice. Small amounts of dark chocolate. Limit all sweets and desserts. Seasonings and condiments All seasonings and condiments. The items listed above may not be a complete list of foods and drinks you can eat. Contact a dietitian for more options. What foods should I avoid? Fruits Canned fruit in heavy syrup. Fruit in cream or butter sauce. Fried fruit. Limit coconut. Vegetables Vegetables cooked in cheese, cream, or butter sauce. Fried vegetables. Grains Breads that are made with saturated or trans fats, oils, or whole milk. Croissants. Sweet rolls. Donuts. High-fat crackers, such as cheese crackers. Meats and other proteins Fatty meats, such as hot dogs, ribs, sausage, bacon,  rib-eye roast or steak. High-fat deli meats, such as salami and bologna. Caviar. Domestic duck and goose. Organ meats, such as liver. Dairy Cream, sour cream, cream cheese, and creamed cottage cheese. Whole-milk cheeses. Whole or 2% milk that is liquid, evaporated, or condensed. Whole buttermilk. Cream sauce or high-fat cheese sauce. Yogurt that is made from whole milk. Fats and oils Meat fat, or shortening. Cocoa butter, hydrogenated oils, palm oil, coconut oil, palm kernel oil. Solid fats and shortenings, including bacon fat, salt pork, lard, and butter. Nondairy cream substitutes. Salad dressings with cheese or sour cream. Beverages Regular sodas and juice drinks with added sugar. Sweets and desserts Frosting. Pudding. Cookies. Cakes. Pies. Milk chocolate or white chocolate. Buttered syrups. Full-fat ice cream or ice cream drinks. The items listed above may not be a complete list of foods and drinks to avoid.  Contact a dietitian for more information. Summary Heart-healthy meal planning includes eating less unhealthy fats, eating more healthy fats, and making other changes in your diet. Eat a balanced diet. This includes fruits and vegetables, low-fat or nonfat dairy, lean protein, nuts and legumes, whole grains, and heart-healthy oils and fats. This information is not intended to replace advice given to you by your health care provider. Make sure you discuss any questions you have with your health care provider. Document Revised: 04/24/2021 Document Reviewed: 04/24/2021 Elsevier Patient Education  2024 [employer] Low-Sodium Eating Plan Salt (sodium) helps you keep a healthy balance of fluids in your body. Too much sodium can raise your blood pressure. It can also cause fluid and waste to be held in your body. Your health care provider or dietitian may recommend a low-sodium eating plan if you have high blood pressure (hypertension), kidney disease, liver disease, or heart failure. Eating  less sodium can help lower your blood pressure and reduce swelling. It can also protect your heart, liver, and kidneys. What are tips for following this plan? Reading food labels  Check food labels for the amount of sodium per serving. If you eat more than one serving, you must multiply the listed amount by the number of servings. Choose foods with less than 140 milligrams (mg) of sodium per serving. Avoid foods with 300 mg of sodium or more per serving. Always check how much sodium is in a product, even if the label says "unsalted" or "no salt added." Shopping  Buy products labeled as "low-sodium" or "no salt added." Buy fresh foods. Avoid canned foods and pre-made or frozen meals. Avoid canned, cured, or processed meats. Buy breads that have less than 80 mg of sodium per slice. Cooking  Eat more home-cooked food. Try to eat less restaurant, buffet, and fast food. Try not to add salt when you cook. Use salt-free seasonings or herbs instead of table salt or sea salt. Check with your provider or pharmacist before using salt substitutes. Cook with plant-based oils, such as canola, sunflower, or olive oil. Meal planning When eating at a restaurant, ask if your food can be made with less salt or no salt. Avoid dishes labeled as brined, pickled, cured, or smoked. Avoid dishes made with soy sauce, miso, or teriyaki sauce. Avoid foods that have monosodium glutamate (MSG) in them. MSG may be added to some restaurant food, sauces, soups, bouillon, and canned foods. Make meals that can be grilled, baked, poached, roasted, or steamed. These are often made with less sodium. General information Try to limit your sodium intake to 1,500-2,300 mg each day, or the amount told by your provider. What foods should I eat? Fruits Fresh, frozen, or canned fruit. Fruit juice. Vegetables Fresh or frozen vegetables. "No salt added" canned vegetables. "No salt added" tomato sauce and paste. Low-sodium or  reduced-sodium tomato and vegetable juice. Grains Low-sodium cereals, such as oats, puffed wheat and rice, and shredded wheat. Low-sodium crackers. Unsalted rice. Unsalted pasta. Low-sodium bread. Whole grain breads and whole grain pasta. Meats and other proteins Fresh or frozen meat, poultry, seafood, and fish. These should have no added salt. Low-sodium canned tuna and salmon. Unsalted nuts. Dried peas, beans, and lentils without added salt. Unsalted canned beans. Eggs. Unsalted nut butters. Dairy Milk. Soy milk. Cheese that is naturally low in sodium, such as ricotta cheese, fresh mozzarella, or Swiss cheese. Low-sodium or reduced-sodium cheese. Cream cheese. Yogurt. Seasonings and condiments Fresh and dried herbs and spices. Salt-free seasonings. Low-sodium  mustard and ketchup. Sodium-free salad dressing. Sodium-free light mayonnaise. Fresh or refrigerated horseradish. Lemon juice. Vinegar. Other foods Homemade, reduced-sodium, or low-sodium soups. Unsalted popcorn and pretzels. Low-salt or salt-free chips. The items listed above may not be all the foods and drinks you can have. Talk to a dietitian to learn more. What foods should I avoid? Vegetables Sauerkraut, pickled vegetables, and relishes. Olives. [place] fries. Onion rings. Regular canned vegetables, except low-sodium or reduced-sodium items. Regular canned tomato sauce and paste. Regular tomato and vegetable juice. Frozen vegetables in sauces. Grains Instant hot cereals. Bread stuffing, pancake, and biscuit mixes. Croutons. Seasoned rice or pasta mixes. Noodle soup cups. Boxed or frozen macaroni and cheese. Regular salted crackers. Self-rising flour. Meats and other proteins Meat or fish that is salted, canned, smoked, spiced, or pickled. Precooked or cured meat, such as sausages or meat loaves. Tomasa Blase. Ham. Pepperoni. Hot dogs. Corned beef. Chipped beef. Salt pork. Jerky. Pickled herring, anchovies, and sardines. Regular canned tuna.  Salted nuts. Dairy Processed cheese and cheese spreads. Hard cheeses. Cheese curds. Blue cheese. Feta cheese. String cheese. Regular cottage cheese. Buttermilk. Canned milk. Fats and oils Salted butter. Regular margarine. Ghee. Bacon fat. Seasonings and condiments Onion salt, garlic salt, seasoned salt, table salt, and sea salt. Canned and packaged gravies. Worcestershire sauce. Tartar sauce. Barbecue sauce. Teriyaki sauce. Soy sauce, including reduced-sodium soy sauce. Steak sauce. Fish sauce. Oyster sauce. Cocktail sauce. Horseradish that you find on the shelf. Regular ketchup and mustard. Meat flavorings and tenderizers. Bouillon cubes. Hot sauce. Pre-made or packaged marinades. Pre-made or packaged taco seasonings. Relishes. Regular salad dressings. Salsa. Other foods Salted popcorn and pretzels. Corn chips and puffs. Potato and tortilla chips. Canned or dried soups. Pizza. Frozen entrees and pot pies. The items listed above may not be all the foods and drinks you should avoid. Talk to a dietitian to learn more. This information is not intended to replace advice given to you by your health care provider. Make sure you discuss any questions you have with your health care provider. Document Revised: 04/05/2022 Document Reviewed: 04/05/2022 Elsevier Patient Education  2024 [employer].

## 2023-02-14 NOTE — Progress Notes (Unsigned)
ZIO XT serial # [id number] from office inventory applied to patient.  Dr. Anne Fu to read.

## 2023-02-18 DIAGNOSIS — I480 Paroxysmal atrial fibrillation: Secondary | ICD-10-CM | POA: Diagnosis not present

## 2023-02-18 DIAGNOSIS — K551 Chronic vascular disorders of intestine: Secondary | ICD-10-CM | POA: Diagnosis not present

## 2023-02-18 DIAGNOSIS — I774 Celiac artery compression syndrome: Secondary | ICD-10-CM | POA: Diagnosis not present

## 2023-02-18 DIAGNOSIS — U071 COVID-19: Secondary | ICD-10-CM | POA: Diagnosis not present

## 2023-02-18 DIAGNOSIS — E785 Hyperlipidemia, unspecified: Secondary | ICD-10-CM | POA: Diagnosis not present

## 2023-02-18 DIAGNOSIS — E876 Hypokalemia: Secondary | ICD-10-CM | POA: Diagnosis not present

## 2023-02-18 DIAGNOSIS — I10 Essential (primary) hypertension: Secondary | ICD-10-CM | POA: Diagnosis not present

## 2023-02-18 DIAGNOSIS — J45909 Unspecified asthma, uncomplicated: Secondary | ICD-10-CM | POA: Diagnosis not present

## 2023-02-19 DIAGNOSIS — R42 Dizziness and giddiness: Secondary | ICD-10-CM | POA: Diagnosis not present

## 2023-02-19 DIAGNOSIS — R2689 Other abnormalities of gait and mobility: Secondary | ICD-10-CM | POA: Diagnosis not present

## 2023-02-19 DIAGNOSIS — M6281 Muscle weakness (generalized): Secondary | ICD-10-CM | POA: Diagnosis not present

## 2023-02-19 DIAGNOSIS — M5459 Other low back pain: Secondary | ICD-10-CM | POA: Diagnosis not present

## 2023-02-25 ENCOUNTER — Other Ambulatory Visit: Payer: Self-pay

## 2023-02-25 MED ORDER — METOPROLOL SUCCINATE ER 25 MG PO TB24: 25.0000 mg | ORAL_TABLET | Freq: Every day | ORAL | 3 refills | Status: AC

## 2023-03-07 DIAGNOSIS — R42 Dizziness and giddiness: Secondary | ICD-10-CM | POA: Diagnosis not present

## 2023-03-07 DIAGNOSIS — I480 Paroxysmal atrial fibrillation: Secondary | ICD-10-CM | POA: Diagnosis not present

## 2023-03-11 DIAGNOSIS — R2689 Other abnormalities of gait and mobility: Secondary | ICD-10-CM | POA: Diagnosis not present

## 2023-03-11 DIAGNOSIS — M6281 Muscle weakness (generalized): Secondary | ICD-10-CM | POA: Diagnosis not present

## 2023-03-11 DIAGNOSIS — M5459 Other low back pain: Secondary | ICD-10-CM | POA: Diagnosis not present

## 2023-03-11 DIAGNOSIS — R42 Dizziness and giddiness: Secondary | ICD-10-CM | POA: Diagnosis not present

## 2023-03-13 DIAGNOSIS — M6281 Muscle weakness (generalized): Secondary | ICD-10-CM | POA: Diagnosis not present

## 2023-03-13 DIAGNOSIS — R2689 Other abnormalities of gait and mobility: Secondary | ICD-10-CM | POA: Diagnosis not present

## 2023-03-13 DIAGNOSIS — M5459 Other low back pain: Secondary | ICD-10-CM | POA: Diagnosis not present

## 2023-03-13 DIAGNOSIS — R42 Dizziness and giddiness: Secondary | ICD-10-CM | POA: Diagnosis not present

## 2023-03-15 DIAGNOSIS — R42 Dizziness and giddiness: Secondary | ICD-10-CM | POA: Diagnosis not present

## 2023-03-15 DIAGNOSIS — H903 Sensorineural hearing loss, bilateral: Secondary | ICD-10-CM | POA: Diagnosis not present

## 2023-03-15 DIAGNOSIS — H8190 Unspecified disorder of vestibular function, unspecified ear: Secondary | ICD-10-CM | POA: Diagnosis not present

## 2023-03-19 DIAGNOSIS — R42 Dizziness and giddiness: Secondary | ICD-10-CM | POA: Diagnosis not present

## 2023-03-19 DIAGNOSIS — M6281 Muscle weakness (generalized): Secondary | ICD-10-CM | POA: Diagnosis not present

## 2023-03-19 DIAGNOSIS — R2689 Other abnormalities of gait and mobility: Secondary | ICD-10-CM | POA: Diagnosis not present

## 2023-03-19 DIAGNOSIS — M5459 Other low back pain: Secondary | ICD-10-CM | POA: Diagnosis not present

## 2023-03-27 ENCOUNTER — Other Ambulatory Visit: Payer: Self-pay | Admitting: Physician Assistant

## 2023-04-01 ENCOUNTER — Other Ambulatory Visit: Payer: Self-pay | Admitting: *Deleted

## 2023-04-01 DIAGNOSIS — M5459 Other low back pain: Secondary | ICD-10-CM | POA: Diagnosis not present

## 2023-04-01 DIAGNOSIS — R42 Dizziness and giddiness: Secondary | ICD-10-CM | POA: Diagnosis not present

## 2023-04-01 DIAGNOSIS — M6281 Muscle weakness (generalized): Secondary | ICD-10-CM | POA: Diagnosis not present

## 2023-04-01 DIAGNOSIS — R2689 Other abnormalities of gait and mobility: Secondary | ICD-10-CM | POA: Diagnosis not present

## 2023-04-01 DIAGNOSIS — I774 Celiac artery compression syndrome: Secondary | ICD-10-CM

## 2023-04-04 DIAGNOSIS — M5459 Other low back pain: Secondary | ICD-10-CM | POA: Diagnosis not present

## 2023-04-04 DIAGNOSIS — R2689 Other abnormalities of gait and mobility: Secondary | ICD-10-CM | POA: Diagnosis not present

## 2023-04-04 DIAGNOSIS — M6281 Muscle weakness (generalized): Secondary | ICD-10-CM | POA: Diagnosis not present

## 2023-04-04 DIAGNOSIS — R42 Dizziness and giddiness: Secondary | ICD-10-CM | POA: Diagnosis not present

## 2023-04-09 ENCOUNTER — Ambulatory Visit: Payer: Medicare PPO | Admitting: Physician Assistant

## 2023-04-09 ENCOUNTER — Ambulatory Visit (HOSPITAL_COMMUNITY)
Admission: RE | Admit: 2023-04-09 | Discharge: 2023-04-09 | Disposition: A | Discharge status: Home / Self Care | Payer: Medicare PPO | Source: Ambulatory Visit | Attending: Vascular Surgery | Admitting: Vascular Surgery

## 2023-04-09 VITALS — BP 146/77 | HR 58 | Temp 97.8°F | Resp 22 | Ht 67.0 in | Wt 147.4 lb

## 2023-04-09 DIAGNOSIS — K551 Chronic vascular disorders of intestine: Secondary | ICD-10-CM

## 2023-04-09 DIAGNOSIS — I774 Celiac artery compression syndrome: Secondary | ICD-10-CM | POA: Insufficient documentation

## 2023-04-09 NOTE — Progress Notes (Signed)
 [facility] [place] HISTORY AND PHYSICAL   History of Present Illness:  Patient is a 89 y.o. year old female who presents for evaluation of  GI blood flow.  She was seen at [facility] with reports of abdominal pain.  During her admission she was found to have leukocytosis with a UTI.  A CT scan of the abdomen pelvis was performed without contrast. 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  This was done without contrast.    She denies any abdominal pain. She denies any postprandial abdominal pain in the weeks preceding admission. She denies unintentional weight loss. She is not afraid to eat.   This has not changed since her exam in the hospital.  She remains asymptomatic for mesenteric stenosis.  She is medically managed on Eliquis  and  Statin.  Past Medical History:  Diagnosis Date   Allergic rhinitis    Arthritis    Asthma    Atypical chest pain    Collapse of right lung    Dyslipidemia    Gastroesophageal reflux disease    Glaucoma    Hematuria    Hyperlipidemia    Hypertension    Lung nodules    Peripheral vascular disease (HCC)    PVC's (premature ventricular contractions) 02/21/2021   Sinus bradycardia 02/21/2021    Past Surgical History:  Procedure Laterality Date   ANAL FISSURE REPAIR     BREAST BIOPSY Right    CATARACT EXTRACTION Bilateral    MASS EXCISION  04/11/2012   Procedure: EXCISION MASS;  Surgeon: Arley JONELLE Curia, MD;  Location: [facility];  Service: Orthopedics;  Laterality: Right;  Excision of mass right upper arm   MEDIASTINOSCOPY  4/11   NASAL SINUS SURGERY     TOTAL ABDOMINAL HYSTERECTOMY W/ BILATERAL SALPINGOOPHORECTOMY      ROS:   General:  No weight loss, Fever, chills  HEENT: No recent headaches, no nasal bleeding, no visual changes, no sore throat  Neurologic: No dizziness, blackouts, seizures. No recent symptoms of stroke or mini- stroke. No recent episodes of slurred  speech, or temporary blindness.  Cardiac: No recent episodes of chest pain/pressure, no shortness of breath at rest.  No shortness of breath with exertion.  Denies history of atrial fibrillation or irregular heartbeat  Vascular: No history of rest pain in feet.  No history of claudication.  No history of non-healing ulcer, No history of DVT   Pulmonary: No home oxygen, no productive cough, no hemoptysis,  No asthma or wheezing  Musculoskeletal:  [ x] Arthritis, [ ]  Low back pain,  [ ]  Joint pain  Hematologic:No history of hypercoagulable state.  No history of easy bleeding.  No history of anemia  Gastrointestinal: No hematochezia or melena,  No gastroesophageal reflux, no trouble swallowing  Urinary: [ ]  chronic Kidney disease, [ ]  on HD - [ ]  MWF or [ ]  TTHS, [ ]  Burning with urination, [ ]  Frequent urination, [ ]  Difficulty urinating;   Skin: No rashes  Psychological: No history of anxiety,  No history of depression  Social History Social History   Tobacco Use   Smoking status: Former    Current packs/day: 0.00    Types: Cigarettes    Start date: 17    Quit date: 1960    Years since quitting: 65.0   Smokeless tobacco: Never   Tobacco comments:    social smoker  Vaping Use   Vaping status: Never  Used  Substance Use Topics   Alcohol use: No    Alcohol/week: 0.0 standard drinks of alcohol   Drug use: No    Family History Family History  Problem Relation Age of Onset   Cancer Mother    Heart disease Father    Breast cancer Cousin    Heart attack Neg Hx     Allergies  Allergies  Allergen Reactions   Other Shortness Of Breath    Pt states she has asthma and seafood causes breathing problems.    Shellfish Allergy Shortness Of Breath    Pt states she has asthma and seafood causes breathing problems.   Iodine Other (See Comments)    REACTION:  UNSURE TOLD TO LIST AS ALLERGEN BY MD REACTION:  UNSURE TOLD TO LIST AS ALLERGEN BY MD   Latex Itching and Rash    Methocarbamol Other (See Comments)   Pravastatin Other (See Comments)   Adhesive [Tape] Rash     Current Outpatient Medications  Medication Sig Dispense Refill   albuterol  (VENTOLIN  HFA) 108 (90 Base) MCG/ACT inhaler INHALE 1-2 PUFFS BY MOUTH EVERY 6 HOURS AS NEEDED FOR WHEEZE OR SHORTNESS OF BREATH 18 each 3   amLODipine  (NORVASC ) 5 MG tablet Take 1 tablet by mouth daily.     Calcium  Carb-Cholecalciferol  600-200 MG-UNIT TABS Take 1 tablet by mouth daily.     cetirizine  (ZYRTEC ) 10 MG tablet Take 10 mg by mouth every morning.     dorzolamide -timolol  (COSOPT ) 2-0.5 % ophthalmic solution Place 1 drop into the right eye 2 (two) times daily.     ELIQUIS  5 MG TABS tablet TAKE 1 TABLET BY MOUTH TWICE A DAY 60 tablet 3   ferrous sulfate 325 (65 FE) MG tablet Take 325 mg by mouth every Monday, Wednesday, and Friday.     fluticasone  (FLONASE ) 50 MCG/ACT nasal spray Place 1 spray into both nostrils 2 (two) times daily as needed for allergies.      fluticasone  furoate-vilanterol (BREO ELLIPTA ) 100-25 MCG/ACT AEPB Inhale 1 puff into the lungs daily. 60 each 5   gabapentin  (NEURONTIN ) 300 MG capsule Take 1 capsule (300 mg total) by mouth at bedtime. (Patient taking differently: Take 300 mg by mouth 2 (two) times daily.) 90 capsule 3   guaiFENesin -dextromethorphan  (ROBITUSSIN DM) 100-10 MG/5ML syrup Take 10 mLs by mouth every 4 (four) hours as needed for cough. 236 mL 0   latanoprost  (XALATAN ) 0.005 % ophthalmic solution Place 1 drop into both eyes at bedtime.     meclizine  (ANTIVERT ) 12.5 MG tablet Take 1 tablet (12.5 mg total) by mouth 3 (three) times daily as needed for dizziness (Vertigo). 30 tablet 0   metoprolol  succinate (TOPROL -XL) 25 MG 24 hr tablet Take 1 tablet (25 mg total) by mouth daily. 90 tablet 3   Multiple Vitamin (MULTIVITAMIN) tablet Take 1 tablet by mouth every morning.      nitroGLYCERIN  (NITROSTAT ) 0.4 MG SL tablet PLACE 1 TABLET UNDER TONGUE EVERY 5 MINUTES AS NEEDED FOR CHEST PAIN  (UP TO 3 DOSES IN 15 MINUTES) 75 tablet 3   rosuvastatin  (CRESTOR ) 20 MG tablet Take 1 tablet (20 mg total) by mouth daily. 90 tablet 2   No current facility-administered medications for this visit.   Facility-Administered Medications Ordered in Other Visits  Medication Dose Route Frequency Provider Last Rate Last Admin   regadenoson  (LEXISCAN ) injection SOLN 0.4 mg  0.4 mg Intravenous Once Turner, Traci R, MD       regadenoson  (LEXISCAN ) injection SOLN 0.4 mg  0.4 mg Intravenous Once Kate Lonni CROME, MD       technetium tetrofosmin  (TC-MYOVIEW ) injection 32.3 millicurie  32.3 millicurie Intravenous Once PRN Kate Lonni CROME, MD        Physical Examination  Vitals:   04/09/23 0854  BP: (!) 146/77  Pulse: (!) 58  Resp: (!) 22  Temp: 97.8 F (36.6 C)  TempSrc: Temporal  SpO2: 99%  Weight: 147 lb 6.4 oz (66.9 kg)  Height: 5' 7 (1.702 m)    Body mass index is 23.09 kg/m.  General:  Alert and oriented, no acute distress HEENT: Normal Neck: No bruit or JVD Pulmonary: Clear to auscultation bilaterally Cardiac: Regular Rate and Rhythm without murmur Abdomen: Soft, non-tender, non-distended, no mass, no scars, positive BS to auscultation. Skin: No rash Extremity Pulses: radial, femoral, pulses bilaterally, doppler signals intact DP/PT Musculoskeletal: No deformity or edema  Neurologic: Upper and lower extremity motor grossly intact and symmetric   DATA:   Duplex Findings:  +--------------------+--------+--------+------+--------+  Mesenteric         PSV cm/sEDV cm/sPlaqueComments  +--------------------+--------+--------+------+--------+  Aorta at Celiac       108                           +--------------------+--------+--------+------+--------+  Celiac Artery Origin  395                           +--------------------+--------+--------+------+--------+  SMA Proximal          312      50                    +--------------------+--------+--------+------+--------+  SMA Mid               211      57                   +--------------------+--------+--------+------+--------+  CHA                  191      34                   +--------------------+--------+--------+------+--------+  Splenic              131      46                   +--------------------+--------+--------+------+--------+   Summary:  Mesenteric:  70 to 99% stenosis in the superior mesenteric artery and celiac artery.       ASSESSMENT/PLAN:  Asymptomatic mesenteric 312 PSV and celiac 395 PSV with stenosis 70-99 % She denies post prandial pain, N/V ro unintentional weight loss. She has no symptoms of ischemia in her LE.  She denies claudication, rest pain or non healing wounds.   She states she has peripheral neuropathy, but can walk as far as she likes.   She should continue Eliquis  and Stain therapy daily.  She will f/u in 6 months for exam and repeat duplex.  If she develops abdominal pain or post prandial pain , N/V she will call our office.       Maurilio Deland Collet PA-C [facility] [place] Office: [phone]  MD in clinic [place]

## 2023-04-11 DIAGNOSIS — M5459 Other low back pain: Secondary | ICD-10-CM | POA: Diagnosis not present

## 2023-04-11 DIAGNOSIS — M6281 Muscle weakness (generalized): Secondary | ICD-10-CM | POA: Diagnosis not present

## 2023-04-11 DIAGNOSIS — R2689 Other abnormalities of gait and mobility: Secondary | ICD-10-CM | POA: Diagnosis not present

## 2023-04-11 DIAGNOSIS — R42 Dizziness and giddiness: Secondary | ICD-10-CM | POA: Diagnosis not present

## 2023-04-14 ENCOUNTER — Other Ambulatory Visit (HOSPITAL_COMMUNITY): Payer: Self-pay | Admitting: Internal Medicine

## 2023-04-17 DIAGNOSIS — M7918 Myalgia, other site: Secondary | ICD-10-CM | POA: Diagnosis not present

## 2023-04-18 DIAGNOSIS — H04123 Dry eye syndrome of bilateral lacrimal glands: Secondary | ICD-10-CM | POA: Diagnosis not present

## 2023-04-18 DIAGNOSIS — H401121 Primary open-angle glaucoma, left eye, mild stage: Secondary | ICD-10-CM | POA: Diagnosis not present

## 2023-04-18 DIAGNOSIS — H35033 Hypertensive retinopathy, bilateral: Secondary | ICD-10-CM | POA: Diagnosis not present

## 2023-04-18 DIAGNOSIS — H401112 Primary open-angle glaucoma, right eye, moderate stage: Secondary | ICD-10-CM | POA: Diagnosis not present

## 2023-04-18 DIAGNOSIS — H47092 Other disorders of optic nerve, not elsewhere classified, left eye: Secondary | ICD-10-CM | POA: Diagnosis not present

## 2023-04-19 ENCOUNTER — Other Ambulatory Visit: Payer: Self-pay

## 2023-04-19 DIAGNOSIS — K551 Chronic vascular disorders of intestine: Secondary | ICD-10-CM

## 2023-04-19 DIAGNOSIS — I774 Celiac artery compression syndrome: Secondary | ICD-10-CM

## 2023-05-07 DIAGNOSIS — M5136 Other intervertebral disc degeneration, lumbar region with discogenic back pain only: Secondary | ICD-10-CM | POA: Diagnosis not present

## 2023-05-07 DIAGNOSIS — M25551 Pain in right hip: Secondary | ICD-10-CM | POA: Diagnosis not present

## 2023-05-29 DIAGNOSIS — M47816 Spondylosis without myelopathy or radiculopathy, lumbar region: Secondary | ICD-10-CM | POA: Diagnosis not present

## 2023-06-06 DIAGNOSIS — M545 Low back pain, unspecified: Secondary | ICD-10-CM | POA: Diagnosis not present

## 2023-06-17 DIAGNOSIS — M5416 Radiculopathy, lumbar region: Secondary | ICD-10-CM | POA: Diagnosis not present

## 2023-06-19 ENCOUNTER — Ambulatory Visit: Admitting: Neurology

## 2023-06-19 ENCOUNTER — Encounter: Payer: Self-pay | Admitting: Neurology

## 2023-06-19 ENCOUNTER — Ambulatory Visit (HOSPITAL_COMMUNITY)
Admission: RE | Admit: 2023-06-19 | Discharge: 2023-06-19 | Disposition: A | Discharge status: Home / Self Care | Payer: Medicare PPO | Source: Ambulatory Visit | Attending: Internal Medicine | Admitting: Internal Medicine

## 2023-06-19 VITALS — BP 152/60 | HR 62 | Ht 67.0 in | Wt 150.8 lb

## 2023-06-19 VITALS — BP 130/48 | HR 60 | Ht 67.0 in | Wt 151.0 lb

## 2023-06-19 DIAGNOSIS — Z7901 Long term (current) use of anticoagulants: Secondary | ICD-10-CM | POA: Diagnosis not present

## 2023-06-19 DIAGNOSIS — I10 Essential (primary) hypertension: Secondary | ICD-10-CM | POA: Insufficient documentation

## 2023-06-19 DIAGNOSIS — Z79899 Other long term (current) drug therapy: Secondary | ICD-10-CM | POA: Diagnosis not present

## 2023-06-19 DIAGNOSIS — E785 Hyperlipidemia, unspecified: Secondary | ICD-10-CM | POA: Diagnosis not present

## 2023-06-19 DIAGNOSIS — G629 Polyneuropathy, unspecified: Secondary | ICD-10-CM | POA: Diagnosis not present

## 2023-06-19 DIAGNOSIS — I2510 Atherosclerotic heart disease of native coronary artery without angina pectoris: Secondary | ICD-10-CM | POA: Diagnosis not present

## 2023-06-19 DIAGNOSIS — D6869 Other thrombophilia: Secondary | ICD-10-CM | POA: Diagnosis not present

## 2023-06-19 DIAGNOSIS — J45909 Unspecified asthma, uncomplicated: Secondary | ICD-10-CM | POA: Insufficient documentation

## 2023-06-19 DIAGNOSIS — I480 Paroxysmal atrial fibrillation: Secondary | ICD-10-CM

## 2023-06-19 MED ORDER — GABAPENTIN 300 MG PO CAPS: 300.0000 mg | ORAL_CAPSULE | Freq: Every day | ORAL | 3 refills | Status: AC

## 2023-06-19 NOTE — Progress Notes (Signed)
 Primary Care Physician: Thana Ates, MD Primary Cardiologist: Dr. Katrinka Blazing Primary Electrophysiologist: None Referring Physician: [facility]    Anne [name] is a 89 y.o. female with a history of HTN, HLD, GERD, anxiety, nonobstructive CAD, asthma, and atrial fibrillation who presents for consultation in the [facility]. The patient was initially diagnosed with atrial fibrillation on 07/22/22 after presenting to [facility] ED with symptoms of shortness of breath and cough. She was in Afib without RVR and discharged on Eliquis 2.5 mg BID. Patient is on Eliquis 2.5 mg BID for a CHADS2VASC score of 4.  On evaluation 07/31/22, she is currently in SR. She was sick and on prednisone which led to ED evaluation when she was found to be Afib that was rate controlled. She converted to SR spontaneously but does not know when this happened. She appears to not have any cardiac awareness of her Afib. She maybe feels chest pain at time but this is noted historically in review of records as well.   She is compliant with anticoagulation and has not missed any doses. She has no bleeding concerns.  On follow up 08/30/22, she is currently in NSR. Cardiac monitor worn for 2 weeks did not show any Afib burden. Since last office visit, she has felt well overall. She has been compliant with Toprol 25 mg daily and Eliquis 5 mg BID. No bleeding concerns.  On follow up 12/19/22, she is currently in NSR. No episodes of Afib since last office visit that she knows of but unsure if has cardiac awareness. She did not obtain rhythm monitoring device. She has felt well overall. No bleeding issues on Eliquis.   On follow up 06/19/23, she is currently in NSR. She has had no episodes of Afib since last office visit. She feels well overall. No bleeding on Eliquis.   Today, she denies symptoms of palpitations, shortness of breath, orthopnea, PND, lower extremity edema, dizziness, presyncope,  syncope, snoring, daytime somnolence, bleeding, or neurologic sequela. The patient is tolerating medications without difficulties and is otherwise without complaint today.   Atrial Fibrillation Risk Factors:  she does not have symptoms or diagnosis of sleep apnea. she does not have a history of rheumatic fever. she does not have a history of alcohol use. The patient does not have a history of early familial atrial fibrillation or other arrhythmias.  she has a BMI of Body mass index is 23.62 kg/m.Marland Kitchen Filed Weights   06/19/23 1106  Weight: 68.4 kg    Atrial Fibrillation Management history:  Previous antiarrhythmic drugs: None Previous cardioversions: None Previous ablations: None Anticoagulation history: Eliquis 5 mg BID   Past Medical History:  Diagnosis Date   Allergic rhinitis    Arthritis    Asthma    Atypical chest pain    Collapse of right lung    Dyslipidemia    Gastroesophageal reflux disease    Glaucoma    Hematuria    Hyperlipidemia    Hypertension    Lung nodules    Peripheral vascular disease (HCC)    PVC's (premature ventricular contractions) 02/21/2021   Sinus bradycardia 02/21/2021   Past Surgical History:  Procedure Laterality Date   ANAL FISSURE REPAIR     BREAST BIOPSY Right    CATARACT EXTRACTION Bilateral    MASS EXCISION  04/11/2012   Procedure: EXCISION MASS;  Surgeon: Nicki Reaper, MD;  Location: [facility];  Service: Orthopedics;  Laterality: Right;  Excision of  mass right upper arm   MEDIASTINOSCOPY  4/11   NASAL SINUS SURGERY     TOTAL ABDOMINAL HYSTERECTOMY W/ BILATERAL SALPINGOOPHORECTOMY      Current Outpatient Medications  Medication Sig Dispense Refill   albuterol (VENTOLIN HFA) 108 (90 Base) MCG/ACT inhaler INHALE 1-2 PUFFS BY MOUTH EVERY 6 HOURS AS NEEDED FOR WHEEZE OR SHORTNESS OF BREATH 18 each 3   amLODipine (NORVASC) 5 MG tablet Take 1 tablet by mouth daily.     apixaban (ELIQUIS) 5 MG TABS tablet TAKE 1 TABLET  BY MOUTH TWICE A DAY 60 tablet 11   Calcium Carb-Cholecalciferol 600-200 MG-UNIT TABS Take 1 tablet by mouth daily.     cetirizine (ZYRTEC) 10 MG tablet Take 10 mg by mouth every morning.     chlorhexidine (PERIDEX) 0.12 % solution USE AS DIRECTED PLEASE SPIT OUT     dorzolamide-timolol (COSOPT) 2-0.5 % ophthalmic solution Place 1 drop into the right eye 2 (two) times daily.     fluticasone (FLONASE) 50 MCG/ACT nasal spray Place 1 spray into both nostrils 2 (two) times daily as needed for allergies.      fluticasone furoate-vilanterol (BREO ELLIPTA) 100-25 MCG/ACT AEPB Inhale 1 puff into the lungs daily. 60 each 5   gabapentin (NEURONTIN) 300 MG capsule Take 1 capsule (300 mg total) by mouth at bedtime. 90 capsule 3   latanoprost (XALATAN) 0.005 % ophthalmic solution Place 1 drop into both eyes at bedtime.     meclizine (ANTIVERT) 12.5 MG tablet Take 1 tablet (12.5 mg total) by mouth 3 (three) times daily as needed for dizziness (Vertigo). 30 tablet 0   metoprolol succinate (TOPROL-XL) 25 MG 24 hr tablet Take 1 tablet (25 mg total) by mouth daily. 90 tablet 3   Multiple Vitamin (MULTIVITAMIN) tablet Take 1 tablet by mouth every morning.      nitroGLYCERIN (NITROSTAT) 0.4 MG SL tablet PLACE 1 TABLET UNDER TONGUE EVERY 5 MINUTES AS NEEDED FOR CHEST PAIN (UP TO 3 DOSES IN 15 MINUTES) 75 tablet 3   rosuvastatin (CRESTOR) 20 MG tablet Take 1 tablet (20 mg total) by mouth daily. 90 tablet 2   ferrous sulfate 325 (65 FE) MG tablet Take 325 mg by mouth every Monday, Wednesday, and Friday. (Patient not taking: Reported on 06/19/2023)     No current facility-administered medications for this encounter.   Facility-Administered Medications Ordered in Other Encounters  Medication Dose Route Frequency Provider Last Rate Last Admin   regadenoson (LEXISCAN) injection SOLN 0.4 mg  0.4 mg Intravenous Once Quintella Reichert, MD       regadenoson (LEXISCAN) injection SOLN 0.4 mg  0.4 mg Intravenous Once Little Ishikawa, MD       technetium tetrofosmin (TC-MYOVIEW) injection 32.3 millicurie  32.3 millicurie Intravenous Once PRN Little Ishikawa, MD        Allergies  Allergen Reactions   Other Shortness Of Breath    Pt states she has asthma and seafood causes breathing problems.    Shellfish Allergy Shortness Of Breath    Pt states she has asthma and seafood causes breathing problems.   Iodine Other (See Comments)    REACTION: " UNSURE TOLD TO LIST AS ALLERGEN BY MD" REACTION: " UNSURE TOLD TO LIST AS ALLERGEN BY MD"   Latex Itching and Rash   Methocarbamol Other (See Comments)   Pravastatin Other (See Comments)   Adhesive [Tape] Rash   ROS- All systems are reviewed and negative except as per the HPI above.  Physical  Exam: Vitals:   06/19/23 1106  BP: (!) 152/60  Pulse: 62  Weight: 68.4 kg  Height: 5\' 7"  (1.702 m)    GEN- The patient is well appearing, alert and oriented x 3 today.   Neck - no JVD or carotid bruit noted Lungs- Clear to ausculation bilaterally, normal work of breathing Heart- Regular rate and rhythm, no murmurs, rubs or gallops, PMI not laterally displaced Extremities- no clubbing, cyanosis, or edema Skin - no rash or ecchymosis noted   Wt Readings from Last 3 Encounters:  06/19/23 68.4 kg  06/19/23 68.5 kg  04/09/23 66.9 kg    EKG today demonstrates  Vent. rate 62 BPM PR interval 226 ms QRS duration 68 ms QT/QTcB 364/369 ms P-R-T axes 79 72 69 Sinus rhythm with 1st degree A-V block Septal infarct , age undetermined Abnormal ECG When compared with ECG of 03-Jan-2023 10:47, PREVIOUS ECG IS PRESENT  Echo 03/21/22 demonstrated: 1. Left ventricular ejection fraction, by estimation, is 60 to 65%. The  left ventricle has normal function. The left ventricle has no regional  wall motion abnormalities. Left ventricular diastolic parameters were  normal. The average left ventricular  global longitudinal strain is -19.4 %. The global longitudinal  strain is  normal.   2. Right ventricular systolic function is normal. The right ventricular  size is normal. There is mildly elevated pulmonary artery systolic  pressure. The estimated right ventricular systolic pressure is 37.6 mmHg.   3. The mitral valve is normal in structure. Mild mitral valve  regurgitation. No evidence of mitral stenosis.   4. Tricuspid valve regurgitation is moderate.   5. The aortic valve is tricuspid. There is mild calcification of the  aortic valve. There is mild thickening of the aortic valve. Aortic valve  regurgitation is not visualized. Aortic valve sclerosis is present, with  no evidence of aortic valve stenosis.   6. The inferior vena cava is normal in size with greater than 50%  respiratory variability, suggesting right atrial pressure of 3 mmHg.  Epic records are reviewed at length today.  Cardiac monitor 4/30-5/14/24: Patch Wear Time:  13 days and 23 hours    Predominant rhythm was sinus rhythm 13.1% supraventricular ectopy Less than 1% ventricular ectopy No atrial fibrillation noted Patient triggered episodes associated with sinus rhythm with PACs  CHA2DS2-VASc Score = 4  The patient's score is based upon: CHF History: 0 HTN History: 1 Diabetes History: 0 Stroke History: 0 Vascular Disease History: 0 Age Score: 2 Gender Score: 1     ASSESSMENT AND PLAN: Paroxysmal Atrial Fibrillation (ICD10:  I48.0) The patient's CHA2DS2-VASc score is 4, indicating a 4.8% annual risk of stroke.    She is currently in NSR. She appears to have low overall burden. Continue Toprol 25 mg daily.  2. Secondary Hypercoagulable State (ICD10:  D68.69) The patient is at significant risk for stroke/thromboembolism based upon her CHA2DS2-VASc Score of 4.  Continue Apixaban (Eliquis).   Continue Eliquis 5 mg BID.   3. HTN Elevated today, advised to monitor at home.    Follow up 1 year Afib clinic.     Lake Bells, PA-C Afib Clinic [facility] [address] [place], [place] [address] [phone] 06/19/2023 11:41 AM

## 2023-06-19 NOTE — Progress Notes (Signed)
 Follow-up Visit   Date: 06/19/23    Anne [name] MRN: [id number] DOB: Mar 04, 1931   Interim History: Anne [name] is a 89 y.o. right-handed African American female with asthma, GERD, hyperlipidemia returning the clinic for follow-up of bilateral feet pain.  The patient was accompanied to the clinic by self.   IMPRESSION/PLAN: Idiopathic peripheral neuropathy, mild progression of painful paresthesias and sensory ataxia - Increase gabapentin 300mg  at bedtime - Continue home exercises for balance - Fall precautions discussed.  Use cane as needed.   2. Bilateral carpal tunnel syndrome, mild  - Stable  Return to clinic in 4 months  ---------------------------------------------------------------------- History of present illness: Since early 07/02/2016, she has intermittent stabbing pain over the tip of her left second toe, which occurs sporadically especially at night. Pain is worse with standing or wearing constrictive shoes.  She also complains of dull pain over the medial lower leg and ankle.  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  Unfortunately, she did not have any relief of her foot pain.  She has tried ibuprofen, Aleve, and gabapentin without any relief. MRI left foot which shows nonspecific soft tissue in the second toe with possible postsurgical changes in the flexor digitorum longus tendon. NCS/EMG was normal and did not show evidence of neuropathy or lumbosacral radiculopathy.    She does not have history of diabetes.  She denies any weakness or imbalance and continues to walk unassisted.  She lives alone in a one-level home; her husband passed away in 2016-07-02.   UPDATE 11-Jan-2022:  She is here for follow-up visit.  She briefly increased gabapentin to 600mg  at bedtime because she felt worsening imbalance and thought that it would help.  She has not noticed any improvement in  balance with medication change.  However, she did appreciate balance therapy to help. She walks unassisted, no falls.  There has been no significant worsening of neuropathy.    UPDATE 06/19/2023:  She was doing well until about three weeks ago, and starting having increased tingling involving toes.  She is taking gabapentin 200mg  at bedtime.  She is also having stiffness in the ankles.  She has some imbalance and walks unassisted. She recently completed PT for imbalance.   Medications:  Current Outpatient Medications on File Prior to Visit  Medication Sig Dispense Refill   albuterol (VENTOLIN HFA) 108 (90 Base) MCG/ACT inhaler INHALE 1-2 PUFFS BY MOUTH EVERY 6 HOURS AS NEEDED FOR WHEEZE OR SHORTNESS OF BREATH 18 each 3   amLODipine (NORVASC) 5 MG tablet Take 1 tablet by mouth daily.     apixaban (ELIQUIS) 5 MG TABS tablet TAKE 1 TABLET BY MOUTH TWICE A DAY 60 tablet 11   Calcium Carb-Cholecalciferol 600-200 MG-UNIT TABS Take 1 tablet by mouth daily.     cetirizine (ZYRTEC) 10 MG tablet Take 10 mg by mouth every morning.     dorzolamide-timolol (COSOPT) 2-0.5 % ophthalmic solution Place 1 drop into the right eye 2 (two) times daily.     ferrous sulfate 325 (65 FE) MG tablet Take 325 mg by mouth every Monday, Wednesday, and Friday.     fluticasone (FLONASE) 50 MCG/ACT nasal spray Place 1 spray into both nostrils 2 (two) times daily as needed for allergies.      fluticasone furoate-vilanterol (BREO ELLIPTA) 100-25 MCG/ACT AEPB Inhale 1 puff into the lungs daily. 60 each 5   latanoprost (XALATAN) 0.005 %  ophthalmic solution Place 1 drop into both eyes at bedtime.     meclizine (ANTIVERT) 12.5 MG tablet Take 1 tablet (12.5 mg total) by mouth 3 (three) times daily as needed for dizziness (Vertigo). 30 tablet 0   metoprolol succinate (TOPROL-XL) 25 MG 24 hr tablet Take 1 tablet (25 mg total) by mouth daily. 90 tablet 3   Multiple Vitamin (MULTIVITAMIN) tablet Take 1 tablet by mouth every morning.       nitroGLYCERIN (NITROSTAT) 0.4 MG SL tablet PLACE 1 TABLET UNDER TONGUE EVERY 5 MINUTES AS NEEDED FOR CHEST PAIN (UP TO 3 DOSES IN 15 MINUTES) 75 tablet 3   rosuvastatin (CRESTOR) 20 MG tablet Take 1 tablet (20 mg total) by mouth daily. 90 tablet 2   Current Facility-Administered Medications on File Prior to Visit  Medication Dose Route Frequency Provider Last Rate Last Admin   regadenoson (LEXISCAN) injection SOLN 0.4 mg  0.4 mg Intravenous Once Quintella Reichert, MD       regadenoson (LEXISCAN) injection SOLN 0.4 mg  0.4 mg Intravenous Once Little Ishikawa, MD       technetium tetrofosmin (TC-MYOVIEW) injection 32.3 millicurie  32.3 millicurie Intravenous Once PRN Little Ishikawa, MD        Allergies:  Allergies  Allergen Reactions   Other Shortness Of Breath    Pt states she has asthma and seafood causes breathing problems.    Shellfish Allergy Shortness Of Breath    Pt states she has asthma and seafood causes breathing problems.   Iodine Other (See Comments)    REACTION: " UNSURE TOLD TO LIST AS ALLERGEN BY MD" REACTION: " UNSURE TOLD TO LIST AS ALLERGEN BY MD"   Latex Itching and Rash   Methocarbamol Other (See Comments)   Pravastatin Other (See Comments)   Adhesive [Tape] Rash     Vital Signs:  BP (!) 130/48   Pulse 60   Ht 5\' 7"  (1.702 m)   Wt 151 lb (68.5 kg)   SpO2 100%   BMI 23.65 kg/m     Neurological Exam: MENTAL STATUS including orientation to time, place, person, recent and remote memory, attention span and concentration, language, and fund of knowledge is normal.  Speech is not dysarthric.  CRANIAL NERVES: Extraocular muscle intact.  No ptosis.  Face is symmetric.   MOTOR:  Motor strength is 5/5 in all extremities, including distally.  No atrophy, fasciculations or abnormal movements.  No pronator drift.  Tone is normal.    MSRs:  Reflexes are 2+/4 throughout, except absent bilateral Achilles.  SENSORY:  Vibration is absent at the great toe  bilaterally, and intact above the ankles.   COORDINATION/GAIT:  Gait narrow based and stable, unassisted, mild unsteadiness with turns.   Data: NCS/EMG of the left lower extremity 04/11/2017:  This is a normal study of the left lower extremity. In particular, there is no evidence of a sensorimotor polyneuropathy or lumbosacral radiculopathy.   MRI left foot 02/19/2017: 1. Mild nonspecific soft tissue edema in the second toe with possible postsurgical changes in the flexor digitorum longus tendon. 2. No significant arthropathic changes or acute osseous findings in the second toe. There are mild hammertoe deformities of the digits.  3. Moderate degenerative changes at the first metatarsal phalangeal joint.  Lab Results  Component Value Date   VITAMINB12 1,489 (H) 12/13/2017   Lab Results  Component Value Date   FOLATE >24.1 12/13/2017     Thank you for allowing me to participate in patient's  care.  If I can answer any additional questions, I would be pleased to do so.    Sincerely,    Etai Copado K. Allena Katz, DO

## 2023-06-19 NOTE — Patient Instructions (Signed)
 Start gabapentin 300mg  at bedtime  Continue home exercises for balance

## 2023-07-04 DIAGNOSIS — M5416 Radiculopathy, lumbar region: Secondary | ICD-10-CM | POA: Diagnosis not present

## 2023-07-05 ENCOUNTER — Emergency Department (HOSPITAL_COMMUNITY)
Admission: EM | Admit: 2023-07-05 | Discharge: 2023-07-05 | Disposition: A | Discharge status: Home / Self Care | Attending: Emergency Medicine | Admitting: Emergency Medicine

## 2023-07-05 ENCOUNTER — Emergency Department (HOSPITAL_COMMUNITY)

## 2023-07-05 ENCOUNTER — Encounter (HOSPITAL_COMMUNITY): Payer: Self-pay

## 2023-07-05 DIAGNOSIS — Z79899 Other long term (current) drug therapy: Secondary | ICD-10-CM | POA: Diagnosis not present

## 2023-07-05 DIAGNOSIS — R0602 Shortness of breath: Secondary | ICD-10-CM | POA: Diagnosis not present

## 2023-07-05 DIAGNOSIS — Z91040 Latex allergy status: Secondary | ICD-10-CM | POA: Insufficient documentation

## 2023-07-05 DIAGNOSIS — R58 Hemorrhage, not elsewhere classified: Secondary | ICD-10-CM | POA: Diagnosis not present

## 2023-07-05 DIAGNOSIS — Z7901 Long term (current) use of anticoagulants: Secondary | ICD-10-CM | POA: Diagnosis not present

## 2023-07-05 DIAGNOSIS — I10 Essential (primary) hypertension: Secondary | ICD-10-CM | POA: Diagnosis not present

## 2023-07-05 LAB — CBC
HCT: 35.7 % — ABNORMAL LOW (ref 36.0–46.0)
Hemoglobin: 11.6 g/dL — ABNORMAL LOW (ref 12.0–15.0)
MCH: 30 pg (ref 26.0–34.0)
MCHC: 32.5 g/dL (ref 30.0–36.0)
MCV: 92.2 fL (ref 80.0–100.0)
Platelets: 286 10*3/uL (ref 150–400)
RBC: 3.87 MIL/uL (ref 3.87–5.11)
RDW: 13.9 % (ref 11.5–15.5)
WBC: 5.1 10*3/uL (ref 4.0–10.5)
nRBC: 0 % (ref 0.0–0.2)

## 2023-07-05 LAB — BASIC METABOLIC PANEL WITH GFR
Anion gap: 9 (ref 5–15)
BUN: 23 mg/dL (ref 8–23)
CO2: 23 mmol/L (ref 22–32)
Calcium: 9.8 mg/dL (ref 8.9–10.3)
Chloride: 106 mmol/L (ref 98–111)
Creatinine, Ser: 0.9 mg/dL (ref 0.44–1.00)
GFR, Estimated: >60 mL/min (ref >60)
Glucose, Bld: 159 mg/dL — ABNORMAL HIGH (ref 70–99)
Potassium: 4.5 mmol/L (ref 3.5–5.1)
Sodium: 138 mmol/L (ref 135–145)

## 2023-07-05 LAB — POC OCCULT BLOOD, ED: Fecal Occult Bld: NEGATIVE

## 2023-07-05 NOTE — Discharge Instructions (Signed)
 We could not determine the source of the blood in your mouth earlier this morning but there are no signs of active bleeding and your blood tests were normal.  Return to the urgent to be evaluated if you have recurrent episodes of bleeding.  Follow-up with your doctor next week to be rechecked.

## 2023-07-05 NOTE — ED Provider Notes (Signed)
 [place] EMERGENCY DEPARTMENT AT [facility] Provider Note   CSN: [id number] Arrival date & time: 07/05/23  0559     History  Chief Complaint  Patient presents with   Shortness of Breath    Anne [name] is a 89 y.o. female.   Shortness of Breath    Patient has a history of hematuria hyperlipidemia atypical chest pain, glaucoma acid reflux PVCs bradycardia hypertension.  Patient also has atrial fibrillation and is on chronic anticoagulation.  Patient states she woke up this morning and noticed there was blood in her mouth.  She says it occurred for about 30 minutes.  She had clots in her mouth and it made her feel a bit short of breath.  Patient states after that she started to feel somewhat short of breath.  The bleeding stopped on its own.  She is not really sure where it came from.  She has not been coughing.  She denied any chest pain.  She denies any vomiting or diarrhea.  She has not noticed any blood in her stool  Home Medications Prior to Admission medications   Medication Sig Start Date End Date Taking? Authorizing Provider  albuterol (VENTOLIN HFA) 108 (90 Base) MCG/ACT inhaler INHALE 1-2 PUFFS BY MOUTH EVERY 6 HOURS AS NEEDED FOR WHEEZE OR SHORTNESS OF BREATH 12/06/22   Kalman Shan, MD  amLODipine (NORVASC) 5 MG tablet Take 1 tablet by mouth daily.    [provider]  apixaban (ELIQUIS) 5 MG TABS tablet TAKE 1 TABLET BY MOUTH TWICE A DAY 04/15/23   Eustace Pen, PA-C  Calcium Carb-Cholecalciferol 600-200 MG-UNIT TABS Take 1 tablet by mouth daily.    [provider]  cetirizine (ZYRTEC) 10 MG tablet Take 10 mg by mouth every morning.    [provider]  chlorhexidine (PERIDEX) 0.12 % solution USE AS DIRECTED PLEASE SPIT OUT 05/21/23   [provider]  dorzolamide-timolol (COSOPT) 2-0.5 % ophthalmic solution Place 1 drop into the right eye 2 (two) times daily. 03/14/22   [provider]  ferrous sulfate 325  (65 FE) MG tablet Take 325 mg by mouth every Monday, Wednesday, and Friday. Patient not taking: Reported on 06/19/2023    [provider]  fluticasone (FLONASE) 50 MCG/ACT nasal spray Place 1 spray into both nostrils 2 (two) times daily as needed for allergies.     [provider]  fluticasone furoate-vilanterol (BREO ELLIPTA) 100-25 MCG/ACT AEPB Inhale 1 puff into the lungs daily. 11/28/22   Kalman Shan, MD  gabapentin (NEURONTIN) 300 MG capsule Take 1 capsule (300 mg total) by mouth at bedtime. 06/19/23   Patel, Donika K, DO  latanoprost (XALATAN) 0.005 % ophthalmic solution Place 1 drop into both eyes at bedtime.    [provider]  meclizine (ANTIVERT) 12.5 MG tablet Take 1 tablet (12.5 mg total) by mouth 3 (three) times daily as needed for dizziness (Vertigo). 01/07/23   Glade Lloyd, MD  metoprolol succinate (TOPROL-XL) 25 MG 24 hr tablet Take 1 tablet (25 mg total) by mouth daily. 02/25/23   Jake Bathe, MD  Multiple Vitamin (MULTIVITAMIN) tablet Take 1 tablet by mouth every morning.     [provider]  nitroGLYCERIN (NITROSTAT) 0.4 MG SL tablet PLACE 1 TABLET UNDER TONGUE EVERY 5 MINUTES AS NEEDED FOR CHEST PAIN (UP TO 3 DOSES IN 15 MINUTES) 03/28/23   Jake Bathe, MD  rosuvastatin (CRESTOR) 20 MG tablet Take 1 tablet (20 mg total) by mouth daily. 02/01/23  Jake Bathe, MD      Allergies    Other, Shellfish allergy, Iodine, Latex, Methocarbamol, Pravastatin, and Adhesive [tape]    Review of Systems   Review of Systems  Respiratory:  Positive for shortness of breath.     Physical Exam Updated Vital Signs BP (!) 148/71   Pulse 74   Temp 97.9 F (36.6 C) (Oral)   Resp 16   SpO2 100%  Physical Exam Vitals and nursing note reviewed.  Constitutional:      General: She is not in acute distress.    Appearance: She is well-developed.  HENT:     Head: Normocephalic and atraumatic.     Comments: No blood noted in bilateral nares, no  blood noted in the oropharynx, no dental lesions    Right Ear: External ear normal.     Left Ear: External ear normal.  Eyes:     General: No scleral icterus.       Right eye: No discharge.        Left eye: No discharge.     Conjunctiva/sclera: Conjunctivae normal.  Neck:     Trachea: No tracheal deviation.  Cardiovascular:     Rate and Rhythm: Normal rate and regular rhythm.  Pulmonary:     Effort: Pulmonary effort is normal. No respiratory distress.     Breath sounds: Normal breath sounds. No stridor. No wheezing or rales.  Abdominal:     General: Bowel sounds are normal. There is no distension.     Palpations: Abdomen is soft.     Tenderness: There is no abdominal tenderness. There is no guarding or rebound.  Musculoskeletal:        General: No tenderness or deformity.     Cervical back: Neck supple.  Skin:    General: Skin is warm and dry.     Findings: No rash.  Neurological:     General: No focal deficit present.     Mental Status: She is alert.     Cranial Nerves: No cranial nerve deficit, dysarthria or facial asymmetry.     Sensory: No sensory deficit.     Motor: No abnormal muscle tone or seizure activity.     Coordination: Coordination normal.  Psychiatric:        Mood and Affect: Mood normal.     ED Results / Procedures / Treatments   Labs (all labs ordered are listed, but only abnormal results are displayed) Labs Reviewed  CBC - Abnormal; Notable for the following components:      Result Value   Hemoglobin 11.6 (*)    HCT 35.7 (*)    All other components within normal limits  BASIC METABOLIC PANEL WITH GFR - Abnormal; Notable for the following components:   Glucose, Bld 159 (*)    All other components within normal limits  POC OCCULT BLOOD, ED    EKG EKG Interpretation Date/Time:  Friday July 05 2023 06:11:19 EDT Ventricular Rate:  75 PR Interval:  220 QRS Duration:  92 QT Interval:  358 QTC Calculation: 400 R Axis:   50  Text  Interpretation: Sinus rhythm Prolonged PR interval Probable anteroseptal infarct, old Confirmed by Tilden Fossa [id number]) on 07/05/2023 6:29:42 AM  Radiology DG Chest 2 View Result Date: 07/05/2023 CLINICAL DATA:  Shortness of breath. EXAM: CHEST - 2 VIEW COMPARISON:  01/04/2023 FINDINGS: The lungs are clear without focal pneumonia, edema, pneumothorax or pleural effusion. The cardiopericardial silhouette is within normal limits for size. No acute bony abnormality.  Telemetry leads overlie the chest. IMPRESSION: No active cardiopulmonary disease. Electronically Signed   By: Kennith Center M.D.   On: 07/05/2023 06:44    Procedures Procedures    Medications Ordered in ED Medications - No data to display  ED Course/ Medical Decision Making/ A&P Clinical Course as of 07/05/23 0929  Fri Jul 05, 2023  0726 CBC(!) Hemoglobin stable 11.6 compared to previous values.  Metabolic panel normal. [JK]  0726 Chest x-ray without acute findings. [JK]  1610 Occult blood negative [JK]    Clinical Course User Index [JK] Linwood Dibbles, MD                                 Medical Decision Making Patient woke up with blood in her mouth.  Possibilities include nasopharyngeal source, GI source, pulmonary source.  Problems Addressed: Bleeding: acute illness or injury that poses a threat to life or bodily functions  Amount and/or Complexity of Data Reviewed Labs: ordered. Decision-making details documented in ED Course. Radiology: ordered and independent interpretation performed.   Patient presented with an episode of blood in her mouth.  No signs of bleeding at this time.  No obvious source of blood.  Lower suspicion for GI as patient has not had any vomiting.  No melena noted on exam.  Doubt pulmonary as patient is not coughing.  She mentions some short of breath but it was when she noticed the blood in her mouth.  She is not having any breathing difficulty here.  Chest x-ray does not show any acute findings.   I do not see any obvious nasal source or oral source.  Will monitor in ED to make sure there is no recurrent episodes of bleeding.  Initial labs not show any significant worsening anemia Patient was monitored in the ED and she did not have any episodes of bleeding.  No clear source of bleeding today.  She is not anemic.  She does not have blood in her stool.  Doubt GI source.  She has not been coughing.  I doubt pulmonary source.  It is possible this could have been nasopharyngeal and stopped spontaneously.  Will have patient monitor for any recurrent episodes return to the ED if she has any recurrent bleeding.  Otherwise recommend follow-up with PCP.  MDM       Final Clinical Impression(s) / ED Diagnoses Final diagnoses:  Bleeding    Rx / DC Orders ED Discharge Orders     None         Linwood Dibbles, MD 07/05/23 [phone]

## 2023-07-05 NOTE — ED Triage Notes (Signed)
 Pt states that she woke up tonight and was bleeding from her throat for about 30 minutes? Reports that she has been having sinus drainage recently. Pt also states she has been SOB tonight that was unrelieved by her inhaler.

## 2023-07-18 DIAGNOSIS — H401121 Primary open-angle glaucoma, left eye, mild stage: Secondary | ICD-10-CM | POA: Diagnosis not present

## 2023-07-18 DIAGNOSIS — H401112 Primary open-angle glaucoma, right eye, moderate stage: Secondary | ICD-10-CM | POA: Diagnosis not present

## 2023-07-18 DIAGNOSIS — H47092 Other disorders of optic nerve, not elsewhere classified, left eye: Secondary | ICD-10-CM | POA: Diagnosis not present

## 2023-07-29 DIAGNOSIS — N898 Other specified noninflammatory disorders of vagina: Secondary | ICD-10-CM | POA: Diagnosis not present

## 2023-07-30 DIAGNOSIS — M5416 Radiculopathy, lumbar region: Secondary | ICD-10-CM | POA: Diagnosis not present

## 2023-07-30 DIAGNOSIS — M47816 Spondylosis without myelopathy or radiculopathy, lumbar region: Secondary | ICD-10-CM | POA: Diagnosis not present

## 2023-08-08 DIAGNOSIS — R311 Benign essential microscopic hematuria: Secondary | ICD-10-CM | POA: Diagnosis not present

## 2023-08-08 DIAGNOSIS — R300 Dysuria: Secondary | ICD-10-CM | POA: Diagnosis not present

## 2023-08-08 DIAGNOSIS — N3000 Acute cystitis without hematuria: Secondary | ICD-10-CM | POA: Diagnosis not present

## 2023-08-08 DIAGNOSIS — N362 Urethral caruncle: Secondary | ICD-10-CM | POA: Diagnosis not present

## 2023-08-14 ENCOUNTER — Other Ambulatory Visit: Payer: Self-pay | Admitting: Cardiology

## 2023-08-15 DIAGNOSIS — R300 Dysuria: Secondary | ICD-10-CM | POA: Diagnosis not present

## 2023-08-15 DIAGNOSIS — R3121 Asymptomatic microscopic hematuria: Secondary | ICD-10-CM | POA: Diagnosis not present

## 2023-08-28 ENCOUNTER — Telehealth: Payer: Self-pay | Admitting: *Deleted

## 2023-08-28 NOTE — Telephone Encounter (Signed)
 She should try another breo from anoter location pharmacy and see if she can feel a difference

## 2023-08-28 NOTE — Telephone Encounter (Signed)
 Copied from CRM [id number]. Topic: Clinical - Prescription Issue >> Aug 23, 2023 12:05 PM Margarette Shawl wrote: Reason for CRM:   Pt is contacting clinic due to issues with her Breo inhaler. She reports having difficulty getting the medicine to come out of the inhaler when using it. She has used this medication for quite some time and has never had an issue using the inhaler, and believes it may be defective.  She contacted [facility] on [address], and was advised to reach out to her pulmonologist; however, she is not sure what the clinic can do to help. Requesting if clinic could contact [facility] to see what their procedure would be to get a new inhaler.   [facility]  # [phone]  CB#  [phone] (patient)  I called and spoke with the pt  She states that she is having trouble with her Breo inhaler  She feels like the medication is not coming out like it should and she is unsure if it's the actual inhaler, or her  She is not having any respiratory symptoms and says her asthma has been well controlled  She talked to the pharmacy and they suggested that she call here for advice  Please advise, thanks

## 2023-09-11 ENCOUNTER — Other Ambulatory Visit: Payer: Self-pay | Admitting: Internal Medicine

## 2023-09-12 ENCOUNTER — Telehealth: Payer: Self-pay | Admitting: Neurology

## 2023-09-12 NOTE — Telephone Encounter (Signed)
 Pt was stopped by pharmacy and needs approval and new script for gabepentin, please call pt once complete

## 2023-09-12 NOTE — Telephone Encounter (Signed)
 Called patients pharmacy and was informed that patient hs refills of Gabapentin  left. They are going to start that order and fill for patient.   Called patient and left a message for a call back.

## 2023-09-16 ENCOUNTER — Telehealth: Payer: Self-pay | Admitting: Cardiology

## 2023-09-16 ENCOUNTER — Telehealth (HOSPITAL_COMMUNITY): Payer: Self-pay | Admitting: *Deleted

## 2023-09-16 NOTE — Telephone Encounter (Signed)
 Pt c/o of Chest Pain: STAT if active CP, including tightness, pressure, jaw pain, radiating pain to shoulder/upper arm/back, CP unrelieved by Nitro. Symptoms reported of SOB, nausea, vomiting, sweating.  1. Are you having CP right now? No     2. Are you experiencing any other symptoms (ex. SOB, nausea, vomiting, sweating)? No    3. Is your CP continuous or coming and going? Coming and going    4. Have you taken Nitroglycerin ? Yes, took on 06/10   5. How long have you been experiencing CP? Occurred 06/10    6. If NO CP at time of call then end call with telling Pt to call back or call 911 if Chest pain returns prior to return call from triage team.    Had CP last Tuesday on the left side of breast. Reports she took nitroglycerin  and CP subsided. Reports CP reoccurred yesterday on both left and right side, but was less severe so she did not take any additional nitroglycerin . Reports she called the afib clinic and was advised to call our office. Denies any symptoms today. States she saw that eliquis  can cause CP. Please advise.

## 2023-09-16 NOTE — Telephone Encounter (Signed)
 Spoke with pt, she reports last Thursday last week under her left breast. It developed while she was in the kitchen and it went to lie down. The pain was sharpe and she took 1 NTG and the pain eased. Then yesterday, she developed a pain in the right and left breast but not as hard, so she did not take NTG. She does feel she is in atrial fib. She is wanting to be seen. Follow up scheduled

## 2023-09-16 NOTE — Telephone Encounter (Signed)
 Pt called the [facility] Clinic stating she has had multiple episodes of chest pain requiring nitroglycerin  for relief. Pt states started last Thursday evening. Pt with history of Atypical chest pain followed by Dr. Renna Cary I instructed her to call his office for assessment. Pt verbalized understanding.

## 2023-09-18 DIAGNOSIS — N362 Urethral caruncle: Secondary | ICD-10-CM | POA: Diagnosis not present

## 2023-09-18 DIAGNOSIS — N952 Postmenopausal atrophic vaginitis: Secondary | ICD-10-CM | POA: Diagnosis not present

## 2023-09-18 DIAGNOSIS — R300 Dysuria: Secondary | ICD-10-CM | POA: Diagnosis not present

## 2023-09-22 NOTE — Progress Notes (Unsigned)
 Cardiology Office Note    Date:  09/24/2023  ID:  Anne [name], DOB [birth year], MRN [id number] PCP:  Anne Trula SQUIBB, MD  Cardiologist:  Oneil Parchment, MD  Electrophysiologist:  None   Chief Complaint: f/u chest pain  History of Present Illness: .    Anne [name] is a 89 y.o. female with visit-pertinent history of PAF, SVT, PACs, PVCs, nonobstructive CAD, atypical chest pain, HTN, HLD, GERD, asthma, anxiety, COPD, lung nodules seen for chest pain. Long history of chest pain noted with multiple prior studies reviewed, last coronary CTA 01/2021 showed nonobstructive mild-moderate CAD with CAC 615 distributed in RCA, LM, LAD, LCx, aortic atherosclerosis. Nuc 05/2021 showed artifact, low risk study, EF 74%. She is also known to have hx of PAF (dx 2024) as well as atrial tach, PSVT, PACs, PVCs. Last monitor 03/2023 showed avg 67bpm, 3 SVT runs, rare PACs, PVCs. Afib is managed by the AF clinic.  She is seen today for an episode of chest pain about 2-3 weeks ago. It occurred at random on the left side in a focal distribution, lasting 5-6 mins at a time, resolving then recurring. She finally took 3 SL NTG with eventual relief after 45 minutes or so. The pain recurred briefly on the right side the following day but did not require intervention. Since that time she's felt fine without any recurrent chest pain. No exertional angina, dyspnea, palpitations, edema. She had some mouth bleeding, seen in ER in 07/2023, but hemoccult was OK and this has not recurred.   Labwork independently reviewed: 07/2023 K 4.5, Cr 0.90, Hgb 11.6, plt 286 KPN 02/2023 LDL 78, trig 105, HDL 55 01/2023 MG 2.1 07/2022 TSH OK 12/2021 LDL 81, trig 60   ROS: .    Please see the history of present illness.  All other systems are reviewed and otherwise negative.  Studies Reviewed: SABRA    EKG:  EKG is ordered today, personally reviewed, demonstrating:  EKG Interpretation Date/Time:  Tuesday September 24 2023 15:12:09  EDT Ventricular Rate:  53 PR Interval:  206 QRS Duration:  70 QT Interval:  398 QTC Calculation: 373 R Axis:   81  Text Interpretation: Sinus bradycardia Septal infarct , age undetermined Nonspecific ST upsloping inferiorly, TWI avL Confirmed by Jawanda Passey [phone]) on 09/24/2023 3:34:51 PM  The appearance of other leads appears stable.  CV Studies: Cardiac studies reviewed are outlined and summarized above. Otherwise please see EMR for full report.   Current Reported Medications:.    Current Meds  Medication Sig   albuterol  (VENTOLIN  HFA) 108 (90 Base) MCG/ACT inhaler INHALE 1-2 PUFFS BY MOUTH EVERY 6 HOURS AS NEEDED FOR WHEEZE OR SHORTNESS OF BREATH   amLODipine  (NORVASC ) 5 MG tablet Take 1 tablet by mouth daily.   apixaban  (ELIQUIS ) 5 MG TABS tablet TAKE 1 TABLET BY MOUTH TWICE A DAY   Calcium  Carb-Cholecalciferol  600-200 MG-UNIT TABS Take 1 tablet by mouth daily.   cetirizine  (ZYRTEC ) 10 MG tablet Take 10 mg by mouth every morning.   chlorhexidine  (PERIDEX ) 0.12 % solution USE AS DIRECTED PLEASE SPIT OUT   dorzolamide -timolol  (COSOPT ) 2-0.5 % ophthalmic solution Place 1 drop into the right eye 2 (two) times daily.   estradiol  (ESTRACE ) 0.1 MG/GM vaginal cream Place vaginally.   ferrous sulfate 325 (65 FE) MG tablet Take 325 mg by mouth every Monday, Wednesday, and Friday.   fluticasone  (FLONASE ) 50 MCG/ACT nasal spray Place 1 spray into both nostrils 2 (two) times daily as needed for  allergies.    fluticasone  furoate-vilanterol (BREO ELLIPTA ) 100-25 MCG/ACT AEPB Inhale 1 puff into the lungs daily.   gabapentin  (NEURONTIN ) 300 MG capsule Take 1 capsule (300 mg total) by mouth at bedtime.   GEMTESA 75 MG TABS Take 1 tablet by mouth daily.   latanoprost  (XALATAN ) 0.005 % ophthalmic solution Place 1 drop into both eyes at bedtime.   meclizine  (ANTIVERT ) 12.5 MG tablet Take 1 tablet (12.5 mg total) by mouth 3 (three) times daily as needed for dizziness (Vertigo).   metoprolol  succinate  (TOPROL -XL) 25 MG 24 hr tablet Take 1 tablet (25 mg total) by mouth daily.   Multiple Vitamin (MULTIVITAMIN) tablet Take 1 tablet by mouth every morning.    nitroGLYCERIN  (NITROSTAT ) 0.4 MG SL tablet PLACE 1 TABLET UNDER TONGUE EVERY 5 MINUTES AS NEEDED FOR CHEST PAIN (UP TO 3 DOSES IN 15 MINUTES)   rosuvastatin  (CRESTOR ) 20 MG tablet TAKE 1 TABLET BY MOUTH EVERY DAY   solifenacin (VESICARE) 10 MG tablet    valACYclovir (VALTREX) 1000 MG tablet Take 1,000 mg by mouth 2 (two) times daily.    Physical Exam:    VS:  BP (!) 118/46   Pulse (!) 51   Ht 5' 7 (1.702 m)   Wt 140 lb (63.5 kg)   SpO2 98%   BMI 21.93 kg/m    Wt Readings from Last 3 Encounters:  09/24/23 140 lb (63.5 kg)  06/19/23 150 lb 12.8 oz (68.4 kg)  06/19/23 151 lb (68.5 kg)    GEN: Well nourished, well developed in no acute distress NECK: No JVD; No carotid bruits CARDIAC: RRR, no murmurs, rubs, gallops RESPIRATORY:  Clear to auscultation without rales, wheezing or rhonchi  ABDOMEN: Soft, non-tender, non-distended EXTREMITIES:  No edema; No acute deformity   Asessement and Plan:.    1. Chest pain of uncertain etiology - etiology unclear. EKG with mild nonspecific inferior ST upsloping otherwise similar to prior. She has had abnormalities in this distribution in the past as well. The symptoms were rather short lived, fleeting and atypical. She has felt fine since without any exertional angina or more classic symptoms. We discussed option of pursuing additional testing (ie stress test) versus observation for recurrent symptoms and she prefers the latter which I agree with. ED precautions reviewed. Will check basic labs - BMET, CBC, TSH for completeness.  2. CAD, HLD - plan as above. Not on ASA with age and concomitant Eliquis . Lipids are being followed by PCP. Dr. Jeffrie was the last prescriber but primary care is following the actual numbers, last assessed 02/2023. Will defer to primary care for management.  3. PAF, PAT  - quiescent. Consider repeat monitor if palpitations emerge or pattern of atypical CP recurs. Continue metoprolol  25mg  daily.  4. Essential HTN - BP well controlled, no changes made today.    Disposition: F/u with me in 3-4 months per our discussion.  Signed, Bunny Kleist N Le Ferraz, PA-C

## 2023-09-24 ENCOUNTER — Ambulatory Visit: Attending: Physician Assistant | Admitting: Physician Assistant

## 2023-09-24 ENCOUNTER — Encounter: Payer: Self-pay | Admitting: Physician Assistant

## 2023-09-24 VITALS — BP 118/46 | HR 51 | Ht 67.0 in | Wt 140.0 lb

## 2023-09-24 DIAGNOSIS — R079 Chest pain, unspecified: Secondary | ICD-10-CM

## 2023-09-24 DIAGNOSIS — I4719 Other supraventricular tachycardia: Secondary | ICD-10-CM | POA: Diagnosis not present

## 2023-09-24 DIAGNOSIS — I480 Paroxysmal atrial fibrillation: Secondary | ICD-10-CM | POA: Diagnosis not present

## 2023-09-24 DIAGNOSIS — I10 Essential (primary) hypertension: Secondary | ICD-10-CM

## 2023-09-24 DIAGNOSIS — I2510 Atherosclerotic heart disease of native coronary artery without angina pectoris: Secondary | ICD-10-CM

## 2023-09-24 DIAGNOSIS — E785 Hyperlipidemia, unspecified: Secondary | ICD-10-CM | POA: Diagnosis not present

## 2023-09-24 NOTE — Patient Instructions (Signed)
 Medication Instructions:  No medication changes were made during today's visit.  *If you need a refill on your cardiac medications before your next appointment, please call your pharmacy*   Lab Work: Labs will be drawn today......................... CBC, CMET, TSH If you have labs (blood work) drawn today and your tests are completely normal, you will receive your results only by: MyChart Message (if you have MyChart) OR A paper copy in the mail If you have any lab test that is abnormal or we need to change your treatment, we will call you to review the results.   Testing/Procedures: No procedures were ordered during today's visit.    Follow-Up: At [facility], you and your health needs are our priority.  As part of our continuing mission to provide you with exceptional heart care, we have created designated Provider Care Teams.  These Care Teams include your primary Cardiologist (physician) and Advanced Practice Providers (APPs -  Physician Assistants and Nurse Practitioners) who all work together to provide you with the care you need, when you need it.  We recommend signing up for the patient portal called MyChart.  Sign up information is provided on this After Visit Summary.  MyChart is used to connect with patients for Virtual Visits (Telemedicine).  Patients are able to view lab/test results, encounter notes, upcoming appointments, etc.  Non-urgent messages can be sent to your provider as well.   To learn more about what you can do with MyChart, go to [email or web address].    Your next appointment:   3-4 month(s)  Provider:   Dayna Dunn, PA-C          Other Instructions Thank you for choosing [facility]!

## 2023-09-25 ENCOUNTER — Ambulatory Visit: Payer: Self-pay | Admitting: Physician Assistant

## 2023-09-25 ENCOUNTER — Other Ambulatory Visit: Payer: Self-pay | Admitting: *Deleted

## 2023-09-25 DIAGNOSIS — E875 Hyperkalemia: Secondary | ICD-10-CM

## 2023-09-25 LAB — BASIC METABOLIC PANEL WITH GFR
BUN/Creatinine Ratio: 20 (ref 12–28)
BUN: 23 mg/dL (ref 10–36)
CO2: 21 mmol/L (ref 20–29)
Calcium: 9.4 mg/dL (ref 8.7–10.3)
Chloride: 101 mmol/L (ref 96–106)
Creatinine, Ser: 1.14 mg/dL — ABNORMAL HIGH (ref 0.57–1.00)
Glucose: 92 mg/dL (ref 70–99)
Potassium: 5.4 mmol/L — ABNORMAL HIGH (ref 3.5–5.2)
Sodium: 139 mmol/L (ref 134–144)
eGFR: 46 mL/min/{1.73_m2} — ABNORMAL LOW (ref >59)

## 2023-09-25 LAB — CBC
Hematocrit: 36.6 % (ref 34.0–46.6)
Hemoglobin: 11.7 g/dL (ref 11.1–15.9)
MCH: 30.4 pg (ref 26.6–33.0)
MCHC: 32 g/dL (ref 31.5–35.7)
MCV: 95 fL (ref 79–97)
Platelets: 266 10*3/uL (ref 150–450)
RBC: 3.85 x10E6/uL (ref 3.77–5.28)
RDW: 13.8 % (ref 11.7–15.4)
WBC: 6.8 10*3/uL (ref 3.4–10.8)

## 2023-09-25 LAB — TSH: TSH: 1.59 u[IU]/mL (ref 0.450–4.500)

## 2023-10-10 ENCOUNTER — Telehealth: Payer: Self-pay | Admitting: Physician Assistant

## 2023-10-10 MED ORDER — ISOSORBIDE MONONITRATE ER 30 MG PO TB24: 30.0000 mg | ORAL_TABLET | Freq: Every day | ORAL | 11 refills | Status: AC

## 2023-10-10 NOTE — Telephone Encounter (Signed)
 Pt c/o of Chest Pain: STAT if active (IN THIS MOMENT) CP, including tightness, pressure, jaw pain, shoulder/upper arm/back pain, SOB, nausea, and vomiting.  1. Are you having CP right now (tightness, pressure, or discomfort)? No   2. Are you experiencing any other symptoms (ex. SOB, nausea, vomiting, sweating)? No   3. How long have you been experiencing CP? Started again last night   4. Is your CP continuous or coming and going? Coming and going   5. Have you taken Nitroglycerin ? Did not take yesterday   6. If CP returns before callback, please consider calling 911. ?

## 2023-10-10 NOTE — Telephone Encounter (Signed)
 Called patient about her message. Patient stated she had chest pain that was like a dull ache/burning last night. Patient denies any SOB or swelling. Patient stated she had the same symptoms the day she saw Dayna Dunn PA on 09/24/23. Patient would like for Dr. Jeffrie to review and advise.

## 2023-10-10 NOTE — Telephone Encounter (Signed)
 Anne Anne BROCKS, MD  10/10/23  4:36 PM Start Imdur  30mg   Anne Jeffrie, MD   Called patient back about message. Patient verbalized understanding. Will send in Imdur  for patient.

## 2023-10-11 NOTE — Telephone Encounter (Signed)
 Pt never returned for repeat potassium please remind her

## 2023-10-14 DIAGNOSIS — E875 Hyperkalemia: Secondary | ICD-10-CM | POA: Diagnosis not present

## 2023-10-15 ENCOUNTER — Ambulatory Visit (HOSPITAL_COMMUNITY)
Admission: RE | Admit: 2023-10-15 | Discharge: 2023-10-15 | Disposition: A | Discharge status: Home / Self Care | Payer: Medicare PPO | Source: Ambulatory Visit | Attending: Vascular Surgery | Admitting: Vascular Surgery

## 2023-10-15 ENCOUNTER — Ambulatory Visit: Payer: Self-pay | Admitting: Physician Assistant

## 2023-10-15 ENCOUNTER — Ambulatory Visit (INDEPENDENT_AMBULATORY_CARE_PROVIDER_SITE_OTHER): Payer: Medicare PPO | Admitting: Physician Assistant

## 2023-10-15 VITALS — BP 124/64 | HR 57 | Temp 97.9°F | Resp 18 | Ht 67.0 in | Wt 144.7 lb

## 2023-10-15 DIAGNOSIS — K551 Chronic vascular disorders of intestine: Secondary | ICD-10-CM | POA: Diagnosis not present

## 2023-10-15 DIAGNOSIS — I774 Celiac artery compression syndrome: Secondary | ICD-10-CM | POA: Insufficient documentation

## 2023-10-15 LAB — POTASSIUM: Potassium: 4.6 mmol/L (ref 3.5–5.2)

## 2023-10-15 NOTE — Progress Notes (Signed)
 HISTORY AND PHYSICAL     CC:  follow up Requesting Provider:  Dwight Trula SQUIBB, MD  HPI: Anne [name] is a 89 y.o. (20-May-1930) female who was originally seen by Dr. Magda in the hospital in October 2024 when she was admitted to the hospital for near syncope and also was found to have abdominal pain.  She was found to have significant urinary tract infection. A CT scan of the abdomen pelvis was performed without contrast. 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   Pt was last seen 04/09/2023 and she was not having any abdominal pain, post prandial pain, weight loss or fear of food.  She did have elevated velocities in the Celiac artery and was scheduled for 6 month follow up.  She comes in today for follow up. She states that she does not have any abdominal pain. She does not have pain after she eats and she has not had any weight loss.  She states that she has had a lot of gas.   She does have some tingling neuropathy in her feet.  She denies any claudication or rest pain or non healing wounds on her feet.   She states that she did have some chest pain and saw cardiology and was placed on Imdur  but read the side effects and was worried about taking it.   The pt is on a statin for cholesterol management.  The pt is not on a daily aspirin .   Other AC:  Eliquis  The pt is on CCB, BB for hypertension.   The pt not diabetic.   Tobacco hx:  former    Past Medical History:  Diagnosis Date   Allergic rhinitis    Arthritis    Asthma    Atypical chest pain    Collapse of right lung    Dyslipidemia    Gastroesophageal reflux disease    Glaucoma    Hematuria    Hyperlipidemia    Hypertension    Lung nodules    Peripheral vascular disease (HCC)    PVC's (premature ventricular contractions) 02/21/2021   Sinus bradycardia 02/21/2021    Past Surgical History:   Procedure Laterality Date   ANAL FISSURE REPAIR     BREAST BIOPSY Right    CATARACT EXTRACTION Bilateral    MASS EXCISION  04/11/2012   Procedure: EXCISION MASS;  Surgeon: Arley JONELLE Curia, MD;  Location: [facility];  Service: Orthopedics;  Laterality: Right;  Excision of mass right upper arm   MEDIASTINOSCOPY  4/11   NASAL SINUS SURGERY     TOTAL ABDOMINAL HYSTERECTOMY W/ BILATERAL SALPINGOOPHORECTOMY      Social History   Socioeconomic History   Marital status: Widowed    Spouse name: Not on file   Number of children: Not on file   Years of education: Not on file   Highest education level: Not on file  Occupational History   Occupation: retired  Tobacco Use   Smoking status: Former    Current packs/day: 0.00    Types: Cigarettes    Start date: 1953    Quit date: 1960    Years since quitting: 65.5   Smokeless tobacco: Never   Tobacco comments:    social smoker  Vaping Use   Vaping status: Never Used  Substance and Sexual Activity   Alcohol use: No    Alcohol/week: 0.0  standard drinks of alcohol   Drug use: No   Sexual activity: Never    Birth control/protection: Surgical  Other Topics Concern   Not on file  Social History Narrative   Retired Public relations account executive.  Lives alone, husband passed away in 2016-11-01.  No children.   Right handed   One level home   Social Drivers of Health   Financial Resource Strain: Not on file  Food Insecurity: No Food Insecurity (01/03/2023)   Hunger Vital Sign    Worried About Running Out of Food in the Last Year: Never true    Ran Out of Food in the Last Year: Never true  Transportation Needs: No Transportation Needs (01/03/2023)   PRAPARE - Administrator, Civil Service (Medical): No    Lack of Transportation (Non-Medical): No  Physical Activity: Not on file  Stress: Not on file  Social Connections: Not on file  Intimate Partner Violence: Not At Risk (01/03/2023)   Humiliation, Afraid, Rape, and Kick  questionnaire    Fear of Current or Ex-Partner: No    Emotionally Abused: No    Physically Abused: No    Sexually Abused: No     Family History  Problem Relation Age of Onset   Cancer Mother    Heart disease Father    Breast cancer Cousin    Heart attack Neg Hx     Current Outpatient Medications  Medication Sig Dispense Refill   albuterol  (VENTOLIN  HFA) 108 (90 Base) MCG/ACT inhaler INHALE 1-2 PUFFS BY MOUTH EVERY 6 HOURS AS NEEDED FOR WHEEZE OR SHORTNESS OF BREATH 18 each 3   amLODipine  (NORVASC ) 5 MG tablet Take 1 tablet by mouth daily.     apixaban  (ELIQUIS ) 5 MG TABS tablet TAKE 1 TABLET BY MOUTH TWICE A DAY 60 tablet 11   Calcium  Carb-Cholecalciferol  600-200 MG-UNIT TABS Take 1 tablet by mouth daily.     cetirizine  (ZYRTEC ) 10 MG tablet Take 10 mg by mouth every morning.     chlorhexidine  (PERIDEX ) 0.12 % solution USE AS DIRECTED PLEASE SPIT OUT     dorzolamide -timolol  (COSOPT ) 2-0.5 % ophthalmic solution Place 1 drop into the right eye 2 (two) times daily.     estradiol  (ESTRACE ) 0.1 MG/GM vaginal cream Place vaginally.     ferrous sulfate 325 (65 FE) MG tablet Take 325 mg by mouth every Monday, Wednesday, and Friday.     fluticasone  (FLONASE ) 50 MCG/ACT nasal spray Place 1 spray into both nostrils 2 (two) times daily as needed for allergies.      fluticasone  furoate-vilanterol (BREO ELLIPTA ) 100-25 MCG/ACT AEPB Inhale 1 puff into the lungs daily. 60 each 5   gabapentin  (NEURONTIN ) 300 MG capsule Take 1 capsule (300 mg total) by mouth at bedtime. 90 capsule 3   GEMTESA 75 MG TABS Take 1 tablet by mouth daily.     isosorbide  mononitrate (IMDUR ) 30 MG 24 hr tablet Take 1 tablet (30 mg total) by mouth daily. 30 tablet 11   latanoprost  (XALATAN ) 0.005 % ophthalmic solution Place 1 drop into both eyes at bedtime.     meclizine  (ANTIVERT ) 12.5 MG tablet Take 1 tablet (12.5 mg total) by mouth 3 (three) times daily as needed for dizziness (Vertigo). 30 tablet 0   metoprolol  succinate  (TOPROL -XL) 25 MG 24 hr tablet Take 1 tablet (25 mg total) by mouth daily. 90 tablet 3   Multiple Vitamin (MULTIVITAMIN) tablet Take 1 tablet by mouth every morning.      nitroGLYCERIN  (NITROSTAT ) 0.4  MG SL tablet PLACE 1 TABLET UNDER TONGUE EVERY 5 MINUTES AS NEEDED FOR CHEST PAIN (UP TO 3 DOSES IN 15 MINUTES) 75 tablet 3   rosuvastatin  (CRESTOR ) 20 MG tablet TAKE 1 TABLET BY MOUTH EVERY DAY 90 tablet 3   solifenacin (VESICARE) 10 MG tablet      valACYclovir (VALTREX) 1000 MG tablet Take 1,000 mg by mouth 2 (two) times daily.     No current facility-administered medications for this visit.    Allergies  Allergen Reactions   Other Shortness Of Breath    Pt states she has asthma and seafood causes breathing problems.    Shellfish Allergy Shortness Of Breath    Pt states she has asthma and seafood causes breathing problems.   Iodine Other (See Comments)    REACTION:  UNSURE TOLD TO LIST AS ALLERGEN BY MD REACTION:  UNSURE TOLD TO LIST AS ALLERGEN BY MD   Latex Itching and Rash   Methocarbamol Other (See Comments)   Pravastatin Other (See Comments)   Adhesive [Tape] Rash     REVIEW OF SYSTEMS:   [X]  denotes positive finding, [ ]  denotes negative finding Cardiac  Comments:  Chest pain or chest pressure:    Shortness of breath upon exertion:    Short of breath when lying flat:    Irregular heart rhythm:        Vascular    Pain in calf, thigh, or hip brought on by ambulation:    Pain in feet at night that wakes you up from your sleep:     Blood clot in your veins:    Leg swelling:         Pulmonary    Oxygen at home:    Productive cough:     Wheezing:         Neurologic    Sudden weakness in arms or legs:     Sudden numbness in arms or legs:     Sudden onset of difficulty speaking or slurred speech:    Temporary loss of vision in one eye:     Problems with dizziness:         Gastrointestinal    Blood in stool:     Vomited blood:         Genitourinary    Burning  when urinating:     Blood in urine:        Psychiatric    Major depression:         Hematologic    Bleeding problems:    Problems with blood clotting too easily:        Skin    Rashes or ulcers:        Constitutional    Fever or chills:      PHYSICAL EXAMINATION:  Today's Vitals   10/15/23 0924  BP: 124/64  Pulse: (!) 57  Resp: 18  Temp: 97.9 F (36.6 C)  TempSrc: Oral  Weight: 144 lb 11.2 oz (65.6 kg)  Height: 5' 7 (1.702 m)  PainSc: 0-No pain   Body mass index is 22.66 kg/m.   General:  WDWN appearing less than stated age in NAD; vital signs documented above Gait: Not observed HENT: WNL, normocephalic Pulmonary: normal non-labored breathing Cardiac: regular HR, without carotid bruits Abdomen: soft, NT Skin: without rashes Vascular Exam/Pulses:  Right Left  Radial 2+ (normal) 2+ (normal)  DP 2+ (normal) 1+ (weak)   Extremities: without open wounds;  Musculoskeletal: no muscle wasting or atrophy  Neurologic: A&O X  3 Psychiatric:  The pt has Normal affect.   Non-Invasive Vascular Imaging on 10/15/2023:   Duplex Findings:  +--------------------+--------+--------+------+--------+  Mesenteric         PSV cm/sEDV cm/sPlaqueComments  +--------------------+--------+--------+------+--------+  Aorta at Celiac       109                           +--------------------+--------+--------+------+--------+  Celiac Artery Origin  425                           +--------------------+--------+--------+------+--------+  SMA Proximal          545      84                   +--------------------+--------+--------+------+--------+  SMA Mid               415      43                   +--------------------+--------+--------+------+--------+  SMA Distal            286      43                   +--------------------+--------+--------+------+--------+  CHA                  182      39                    +--------------------+--------+--------+------+--------+  Splenic              160      35                   +--------------------+--------+--------+------+--------+  IMA                  170                           +--------------------+--------+--------+------+--------+   Summary:  Mesenteric:  70 to 99% stenosis in the celiac artery and superior mesenteric artery.      Previous mesenteric duplex 04/09/2023: Duplex Findings:  +--------------------+--------+--------+------+--------+  Mesenteric         PSV cm/sEDV cm/sPlaqueComments  +--------------------+--------+--------+------+--------+  Aorta at Celiac       108                           +--------------------+--------+--------+------+--------+  Celiac Artery Origin  390     140                   +--------------------+--------+--------+------+--------+  SMA Proximal          345      62                   +--------------------+--------+--------+------+--------+  SMA Mid               211      57                   +--------------------+--------+--------+------+--------+  SMA Distal            182      38                   +--------------------+--------+--------+------+--------+  CHA  191      34                   +--------------------+--------+--------+------+--------+  Splenic              131      46                   +--------------------+--------+--------+------+--------+   Summary:  Mesenteric:  70 to 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    ASSESSMENT/PLAN:: 89 y.o. female here for follow up for mesenteric artery atherosclerosis    -pt with increased velocities in the celiac and SMA arteries.  She is completely asymptomatic.  We will continue to follow this closely as she will f/u in 6 months for repeat study.  Discussed with her that if she starts having post prandial pain, fear of  food or weight loss, we should see her back sooner. -encouraged her to discuss with cardiology her concerns about the Imdur  -continue statin.  She is on Eliquis  for PAF.   Lucie Apt, [facility] [phone]  Clinic MD:   Magda

## 2023-10-16 ENCOUNTER — Other Ambulatory Visit: Payer: Self-pay

## 2023-10-16 DIAGNOSIS — K551 Chronic vascular disorders of intestine: Secondary | ICD-10-CM

## 2023-10-18 ENCOUNTER — Telehealth: Payer: Self-pay | Admitting: Cardiology

## 2023-10-18 NOTE — Telephone Encounter (Signed)
 Pt c/o medication issue:  1. Name of Medication: isosorbide  mononitrate (IMDUR ) 30 MG 24 hr tablet  2. How are you currently taking this medication (dosage and times per day)?  Hasn't started on medication yet.  3. Are you having a reaction (difficulty breathing--STAT)?   4. What is your medication issue?   Patient has concerns with side effects because she has vertigo and she saw dizziness listed as a side effect. She says it also warned against taking it with a blood thinner and she's on blood thinner. She says it mentions that older patients may be more susceptible to having issues and was advised to contact MD before taking. Please advise.

## 2023-10-18 NOTE — Progress Notes (Signed)
 Pt has been made aware of normal result and verbalized understanding.  jw

## 2023-10-21 ENCOUNTER — Ambulatory Visit: Admitting: Neurology

## 2023-10-23 NOTE — Telephone Encounter (Signed)
 Spoke with pt and explained Anne [name]'s response regarding the Imdur . Pt verbalized understanding and had no further questions at this time.

## 2023-11-12 ENCOUNTER — Ambulatory Visit: Admitting: Neurology

## 2023-11-12 VITALS — BP 104/56 | Ht 67.0 in | Wt 145.0 lb

## 2023-11-12 DIAGNOSIS — G629 Polyneuropathy, unspecified: Secondary | ICD-10-CM

## 2023-11-12 NOTE — Progress Notes (Signed)
 Follow-up Visit   Date: 11/12/23    Anne [name] MRN: [id number] DOB: [birth year]   Interim History: Anne [name] is a 89 y.o. right-handed African American female with asthma, GERD, hyperlipidemia returning the clinic for follow-up of neuropathy.  The patient was accompanied to the clinic by self.   IMPRESSION/PLAN: Idiopathic peripheral neuropathy, stable with improved pain. - Continue gabapentin  300mg  at bedtime - Continue home exercises for balance  2. Bilateral carpal tunnel syndrome, mild, stable.  Return to clinic in 6 months  ---------------------------------------------------------------------- History of present illness: Since early Dec 06, 2016, she has intermittent stabbing pain over the tip of her left second toe, which occurs sporadically especially at night. Pain is worse with standing or wearing constrictive shoes.  She also complains of dull pain over the medial lower leg and ankle.  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  Unfortunately, she did not have any relief of her foot pain.  She has tried ibuprofen, Aleve, and gabapentin  without any relief. MRI left foot which shows nonspecific soft tissue in the second toe with possible postsurgical changes in the flexor digitorum longus tendon. NCS/EMG was normal and did not show evidence of neuropathy or lumbosacral radiculopathy.    She does not have history of diabetes.  She denies any weakness or imbalance and continues to walk unassisted.  She lives alone in a one-level home; her husband passed away in 12-06-16.   UPDATE 01/12/2022:  She is here for follow-up visit.  She briefly increased gabapentin  to 600mg  at bedtime because she felt worsening imbalance and thought that it would help.  She has not noticed any improvement in balance with medication change.  However, she did appreciate balance therapy to help. She walks  unassisted, no falls.  There has been no significant worsening of neuropathy.    UPDATE 06/19/2023:  She was doing well until about three weeks ago, and starting having increased tingling involving toes.  She is taking gabapentin  200mg  at bedtime.  She is also having stiffness in the ankles.  She has some imbalance and walks unassisted. She recently completed PT for imbalance.   UPDATE 11/12/2023:  She is here for follow-up visit.  She reports improved pain in the feet since increasing gabapentin  300mg  bedtime.  She has mild imbalance.  She also has stiffness in the right foot as compared to the left.  She continues to walk unassisted.  Overall, she is doing well. She remains highly independent with all IADLs and ADLs, including driving.    Medications:  Current Outpatient Medications on File Prior to Visit  Medication Sig Dispense Refill   albuterol  (VENTOLIN  HFA) 108 (90 Base) MCG/ACT inhaler INHALE 1-2 PUFFS BY MOUTH EVERY 6 HOURS AS NEEDED FOR WHEEZE OR SHORTNESS OF BREATH 18 each 3   amLODipine  (NORVASC ) 5 MG tablet Take 1 tablet by mouth daily.     apixaban  (ELIQUIS ) 5 MG TABS tablet TAKE 1 TABLET BY MOUTH TWICE A DAY 60 tablet 11   Calcium  Carb-Cholecalciferol  600-200 MG-UNIT TABS Take 1 tablet by mouth daily.     cetirizine  (ZYRTEC ) 10 MG tablet Take 10 mg by mouth every morning.     chlorhexidine  (PERIDEX ) 0.12 % solution USE AS DIRECTED PLEASE SPIT OUT     dorzolamide -timolol  (COSOPT ) 2-0.5 % ophthalmic solution Place 1 drop into the right eye 2 (two) times daily.     estradiol  (ESTRACE ) 0.1 MG/GM  vaginal cream Place vaginally.     ferrous sulfate 325 (65 FE) MG tablet Take 325 mg by mouth every Monday, Wednesday, and Friday.     fluticasone  (FLONASE ) 50 MCG/ACT nasal spray Place 1 spray into both nostrils 2 (two) times daily as needed for allergies.      fluticasone  furoate-vilanterol (BREO ELLIPTA ) 100-25 MCG/ACT AEPB Inhale 1 puff into the lungs daily. 60 each 5   gabapentin   (NEURONTIN ) 300 MG capsule Take 1 capsule (300 mg total) by mouth at bedtime. 90 capsule 3   GEMTESA 75 MG TABS Take 1 tablet by mouth daily.     isosorbide  mononitrate (IMDUR ) 30 MG 24 hr tablet Take 1 tablet (30 mg total) by mouth daily. 30 tablet 11   latanoprost  (XALATAN ) 0.005 % ophthalmic solution Place 1 drop into both eyes at bedtime.     meclizine  (ANTIVERT ) 12.5 MG tablet Take 1 tablet (12.5 mg total) by mouth 3 (three) times daily as needed for dizziness (Vertigo). 30 tablet 0   metoprolol  succinate (TOPROL -XL) 25 MG 24 hr tablet Take 1 tablet (25 mg total) by mouth daily. 90 tablet 3   Multiple Vitamin (MULTIVITAMIN) tablet Take 1 tablet by mouth every morning.      nitroGLYCERIN  (NITROSTAT ) 0.4 MG SL tablet PLACE 1 TABLET UNDER TONGUE EVERY 5 MINUTES AS NEEDED FOR CHEST PAIN (UP TO 3 DOSES IN 15 MINUTES) 75 tablet 3   rosuvastatin  (CRESTOR ) 20 MG tablet TAKE 1 TABLET BY MOUTH EVERY DAY 90 tablet 3   solifenacin (VESICARE) 10 MG tablet      valACYclovir (VALTREX) 1000 MG tablet Take 1,000 mg by mouth 2 (two) times daily.     No current facility-administered medications on file prior to visit.    Allergies:  Allergies  Allergen Reactions   Other Shortness Of Breath    Pt states she has asthma and seafood causes breathing problems.    Shellfish Allergy Shortness Of Breath    Pt states she has asthma and seafood causes breathing problems.   Iodine Other (See Comments)    REACTION:  UNSURE TOLD TO LIST AS ALLERGEN BY MD REACTION:  UNSURE TOLD TO LIST AS ALLERGEN BY MD   Latex Itching and Rash   Methocarbamol Other (See Comments)   Pravastatin Other (See Comments)   Adhesive [Tape] Rash     Vital Signs:  BP (!) 104/56   Ht 5' 7 (1.702 m)   Wt 145 lb (65.8 kg)   SpO2 98%   BMI 22.71 kg/m     Neurological Exam: MENTAL STATUS including orientation to time, place, person, recent and remote memory, attention span and concentration, language, and fund of knowledge is  normal.  Speech is not dysarthric.  CRANIAL NERVES: Extraocular muscle intact.  No ptosis.  Face is symmetric.   MOTOR:  Motor strength is 5/5 in all extremities, including distally.  No atrophy, fasciculations or abnormal movements.  No pronator drift.  Tone is normal.    MSRs:  Reflexes are 2+/4 throughout, except absent bilateral Achilles.  SENSORY:  Vibration is absent at the great toe bilaterally, and intact above the ankles.   COORDINATION/GAIT:  Gait narrow based and stable, unassisted, mild unsteadiness with turns.  Tandem gait impressively intact.   Data: NCS/EMG of the left lower extremity 04/11/2017:  This is a normal study of the left lower extremity. In particular, there is no evidence of a sensorimotor polyneuropathy or lumbosacral radiculopathy.   MRI left foot 02/19/2017: 1. Mild nonspecific soft  tissue edema in the second toe with possible postsurgical changes in the flexor digitorum longus tendon. 2. No significant arthropathic changes or acute osseous findings in the second toe. There are mild hammertoe deformities of the digits.  3. Moderate degenerative changes at the first metatarsal phalangeal joint.  Lab Results  Component Value Date   VITAMINB12 1,489 (H) 12/13/2017   Lab Results  Component Value Date   FOLATE >24.1 12/13/2017     Thank you for allowing me to participate in patient's care.  If I can answer any additional questions, I would be pleased to do so.    Sincerely,    Dorothea Yow K. Tobie, DO

## 2023-11-14 DIAGNOSIS — H401112 Primary open-angle glaucoma, right eye, moderate stage: Secondary | ICD-10-CM | POA: Diagnosis not present

## 2023-11-14 DIAGNOSIS — H401121 Primary open-angle glaucoma, left eye, mild stage: Secondary | ICD-10-CM | POA: Diagnosis not present

## 2023-11-19 DIAGNOSIS — B9689 Other specified bacterial agents as the cause of diseases classified elsewhere: Secondary | ICD-10-CM | POA: Diagnosis not present

## 2023-11-19 DIAGNOSIS — L818 Other specified disorders of pigmentation: Secondary | ICD-10-CM | POA: Diagnosis not present

## 2023-11-19 DIAGNOSIS — L02821 Furuncle of head [any part, except face]: Secondary | ICD-10-CM | POA: Diagnosis not present

## 2023-11-19 DIAGNOSIS — L728 Other follicular cysts of the skin and subcutaneous tissue: Secondary | ICD-10-CM | POA: Diagnosis not present

## 2023-12-03 DIAGNOSIS — N362 Urethral caruncle: Secondary | ICD-10-CM | POA: Diagnosis not present

## 2023-12-03 DIAGNOSIS — S30824A Blister (nonthermal) of vagina and vulva, initial encounter: Secondary | ICD-10-CM | POA: Diagnosis not present

## 2023-12-03 DIAGNOSIS — N952 Postmenopausal atrophic vaginitis: Secondary | ICD-10-CM | POA: Diagnosis not present

## 2023-12-03 DIAGNOSIS — N7689 Other specified inflammation of vagina and vulva: Secondary | ICD-10-CM | POA: Diagnosis not present

## 2023-12-06 ENCOUNTER — Other Ambulatory Visit: Payer: Self-pay | Admitting: Internal Medicine

## 2023-12-06 DIAGNOSIS — Z1231 Encounter for screening mammogram for malignant neoplasm of breast: Secondary | ICD-10-CM

## 2023-12-06 DIAGNOSIS — N898 Other specified noninflammatory disorders of vagina: Secondary | ICD-10-CM | POA: Diagnosis not present

## 2023-12-06 DIAGNOSIS — N958 Other specified menopausal and perimenopausal disorders: Secondary | ICD-10-CM | POA: Diagnosis not present

## 2023-12-06 DIAGNOSIS — L723 Sebaceous cyst: Secondary | ICD-10-CM | POA: Diagnosis not present

## 2023-12-18 ENCOUNTER — Ambulatory Visit: Admitting: Physician Assistant

## 2023-12-26 DIAGNOSIS — M79602 Pain in left arm: Secondary | ICD-10-CM | POA: Diagnosis not present

## 2023-12-31 DIAGNOSIS — R399 Unspecified symptoms and signs involving the genitourinary system: Secondary | ICD-10-CM | POA: Diagnosis not present

## 2024-01-01 DIAGNOSIS — R399 Unspecified symptoms and signs involving the genitourinary system: Secondary | ICD-10-CM | POA: Diagnosis not present

## 2024-01-01 DIAGNOSIS — N362 Urethral caruncle: Secondary | ICD-10-CM | POA: Diagnosis not present

## 2024-01-01 DIAGNOSIS — N952 Postmenopausal atrophic vaginitis: Secondary | ICD-10-CM | POA: Diagnosis not present

## 2024-01-01 DIAGNOSIS — S30824A Blister (nonthermal) of vagina and vulva, initial encounter: Secondary | ICD-10-CM | POA: Diagnosis not present

## 2024-01-03 ENCOUNTER — Ambulatory Visit
Admission: RE | Admit: 2024-01-03 | Discharge: 2024-01-03 | Disposition: A | Discharge status: Home / Self Care | Source: Ambulatory Visit | Attending: Internal Medicine | Admitting: Internal Medicine

## 2024-01-03 DIAGNOSIS — Z1231 Encounter for screening mammogram for malignant neoplasm of breast: Secondary | ICD-10-CM

## 2024-02-08 DIAGNOSIS — I700 Atherosclerosis of aorta: Secondary | ICD-10-CM | POA: Diagnosis not present

## 2024-02-08 DIAGNOSIS — I4891 Unspecified atrial fibrillation: Secondary | ICD-10-CM | POA: Diagnosis not present

## 2024-02-08 DIAGNOSIS — D6869 Other thrombophilia: Secondary | ICD-10-CM | POA: Diagnosis not present

## 2024-02-08 DIAGNOSIS — M1990 Unspecified osteoarthritis, unspecified site: Secondary | ICD-10-CM | POA: Diagnosis not present

## 2024-02-08 DIAGNOSIS — N1831 Chronic kidney disease, stage 3a: Secondary | ICD-10-CM | POA: Diagnosis not present

## 2024-02-08 DIAGNOSIS — I130 Hypertensive heart and chronic kidney disease with heart failure and stage 1 through stage 4 chronic kidney disease, or unspecified chronic kidney disease: Secondary | ICD-10-CM | POA: Diagnosis not present

## 2024-02-08 DIAGNOSIS — I509 Heart failure, unspecified: Secondary | ICD-10-CM | POA: Diagnosis not present

## 2024-02-08 DIAGNOSIS — Z7901 Long term (current) use of anticoagulants: Secondary | ICD-10-CM | POA: Diagnosis not present

## 2024-02-08 DIAGNOSIS — I25119 Atherosclerotic heart disease of native coronary artery with unspecified angina pectoris: Secondary | ICD-10-CM | POA: Diagnosis not present

## 2024-02-12 DIAGNOSIS — N1831 Chronic kidney disease, stage 3a: Secondary | ICD-10-CM | POA: Diagnosis not present

## 2024-02-12 DIAGNOSIS — I129 Hypertensive chronic kidney disease with stage 1 through stage 4 chronic kidney disease, or unspecified chronic kidney disease: Secondary | ICD-10-CM | POA: Diagnosis not present

## 2024-02-12 DIAGNOSIS — D649 Anemia, unspecified: Secondary | ICD-10-CM | POA: Diagnosis not present

## 2024-02-12 DIAGNOSIS — E7800 Pure hypercholesterolemia, unspecified: Secondary | ICD-10-CM | POA: Diagnosis not present

## 2024-02-12 DIAGNOSIS — Z0000 Encounter for general adult medical examination without abnormal findings: Secondary | ICD-10-CM | POA: Diagnosis not present

## 2024-02-12 DIAGNOSIS — G609 Hereditary and idiopathic neuropathy, unspecified: Secondary | ICD-10-CM | POA: Diagnosis not present

## 2024-02-12 DIAGNOSIS — R2689 Other abnormalities of gait and mobility: Secondary | ICD-10-CM | POA: Diagnosis not present

## 2024-02-12 DIAGNOSIS — Z23 Encounter for immunization: Secondary | ICD-10-CM | POA: Diagnosis not present

## 2024-02-12 DIAGNOSIS — K5909 Other constipation: Secondary | ICD-10-CM | POA: Diagnosis not present

## 2024-02-12 DIAGNOSIS — J4530 Mild persistent asthma, uncomplicated: Secondary | ICD-10-CM | POA: Diagnosis not present

## 2024-02-12 DIAGNOSIS — Z1331 Encounter for screening for depression: Secondary | ICD-10-CM | POA: Diagnosis not present

## 2024-02-12 DIAGNOSIS — M25512 Pain in left shoulder: Secondary | ICD-10-CM | POA: Diagnosis not present

## 2024-02-21 DIAGNOSIS — R310 Gross hematuria: Secondary | ICD-10-CM | POA: Diagnosis not present

## 2024-03-04 DIAGNOSIS — M25512 Pain in left shoulder: Secondary | ICD-10-CM | POA: Diagnosis not present

## 2024-03-04 DIAGNOSIS — G629 Polyneuropathy, unspecified: Secondary | ICD-10-CM | POA: Diagnosis not present

## 2024-03-29 ENCOUNTER — Other Ambulatory Visit (HOSPITAL_COMMUNITY): Payer: Self-pay | Admitting: Internal Medicine

## 2024-04-01 ENCOUNTER — Telehealth: Payer: Self-pay | Admitting: *Deleted

## 2024-04-01 MED ORDER — FLUTICASONE FUROATE-VILANTEROL 100-25 MCG/ACT IN AEPB: 1.0000 | INHALATION_SPRAY | Freq: Every day | RESPIRATORY_TRACT | 0 refills | Status: AC

## 2024-04-01 NOTE — Telephone Encounter (Signed)
 Copied from CRM [id number]. Topic: Clinical - Prescription Issue >> Apr 01, 2024 11:19 AM Corean SAUNDERS wrote: Reason for CRM: Patient was advised she needed to schedule an appointment with Dr. Geronimo before he will fill her fluticasone  furoate-vilanterol (BREO ELLIPTA ) 100-25 MCG/ACT AEPB. Patient schedule next available on 1/28 and is requesting the provider to order her medication as she only has 1 day left.   [id number] [address] [place], [place] - [address] [place] [address] Phone: [phone] Fax: [phone]   I called and spoke with the pt and notified refilled Breo x 1 only and needs to keep appt 04/29/24  She verbalized understanding  Nothing further needed

## 2024-04-14 ENCOUNTER — Ambulatory Visit: Admitting: Physician Assistant

## 2024-04-14 ENCOUNTER — Ambulatory Visit (HOSPITAL_COMMUNITY)
Admission: RE | Admit: 2024-04-14 | Discharge: 2024-04-14 | Disposition: A | Discharge status: Home / Self Care | Source: Ambulatory Visit | Attending: Vascular Surgery | Admitting: Vascular Surgery

## 2024-04-14 ENCOUNTER — Encounter: Payer: Self-pay | Admitting: Physician Assistant

## 2024-04-14 VITALS — BP 149/99 | HR 71 | Temp 98.0°F | Wt 149.0 lb

## 2024-04-14 DIAGNOSIS — K551 Chronic vascular disorders of intestine: Secondary | ICD-10-CM

## 2024-04-14 DIAGNOSIS — I774 Celiac artery compression syndrome: Secondary | ICD-10-CM | POA: Diagnosis not present

## 2024-04-14 NOTE — Progress Notes (Signed)
 " HISTORY AND PHYSICAL     CC:  follow up Requesting Provider:  Dwight Trula SQUIBB, MD  HPI: Anne [name] is a 89 y.o. (03-20-1931) female who  was originally seen by Dr. Magda in the hospital in October 2024 when she was admitted to the hospital for near syncope and also was found to have abdominal pain.  She was found to have significant urinary tract infection. A CT scan of the abdomen pelvis was performed without contrast. 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   She was last seen on 10/15/2023 and at that time, she was not having any post prandial pain, weight loss or fear of food.  She did have some neuropathy but no claudication, rest pain or non healing wounds.   She comes in today for follow up.  She denies any abdominal pain, post prandial pain, fear of food or significant weight loss.  She states that she has lost about 5 lbs but it is because she has not been cooking as much.  She does have some discomfort in the LLQ and states she does not have regular bowel movements and this does improve after a BM.  She states she takes gas-x and this does have improve her gas.  She lives independently and states she has been more active since her husband of 44 years passed away in 04-23-2016.  She does get an intermittent pain/cramp in the lateral left calf mostly when laying dow.  She does not have any swelling in her leg and it is not painful.  She denies any shortness of breath or chest pain.  She states that she had a cystoscopy, which revealed a cyst and it was recommended it be removed.  This was found after she had hematuria.      The pt is on a statin for cholesterol management.  The pt is not on a daily aspirin .   Other AC:  Eliquis  The pt is on CCB for hypertension.   The pt not diabetic.   Tobacco hx:  former   Past Medical History:  Diagnosis Date    Allergic rhinitis    Arthritis    Asthma    Atypical chest pain    Collapse of right lung    Dyslipidemia    Gastroesophageal reflux disease    Glaucoma    Hematuria    Hyperlipidemia    Hypertension    Lung nodules    Peripheral vascular disease    PVC's (premature ventricular contractions) 02/21/2021   Sinus bradycardia 02/21/2021    Past Surgical History:  Procedure Laterality Date   ANAL FISSURE REPAIR     BREAST BIOPSY Right    CATARACT EXTRACTION Bilateral    MASS EXCISION  04/11/2012   Procedure: EXCISION MASS;  Surgeon: Arley JONELLE Curia, MD;  Location: [facility];  Service: Orthopedics;  Laterality: Right;  Excision of mass right upper arm   MEDIASTINOSCOPY  4/11   NASAL SINUS SURGERY     TOTAL ABDOMINAL HYSTERECTOMY W/ BILATERAL SALPINGOOPHORECTOMY      Social History   Socioeconomic History   Marital status: Widowed    Spouse name: Not on file   Number of children: Not on file   Years of education: Not on file   Highest education level: Not on file  Occupational History   Occupation: retired  Tobacco Use  Smoking status: Former    Current packs/day: 0.00    Types: Cigarettes    Start date: 23    Quit date: 1960    Years since quitting: 66.0   Smokeless tobacco: Never   Tobacco comments:    social smoker  Vaping Use   Vaping status: Never Used  Substance and Sexual Activity   Alcohol use: No    Alcohol/week: 0.0 standard drinks of alcohol   Drug use: No   Sexual activity: Never    Birth control/protection: Surgical  Other Topics Concern   Not on file  Social History Narrative   Retired public relations account executive.  Lives alone, husband passed away in 04-24-2016.  No children.   Right handed   One level home   Social Drivers of Health   Tobacco Use: Medium Risk (10/15/2023)   Patient History    Smoking Tobacco Use: Former    Smokeless Tobacco Use: Never    Passive Exposure: Not on Actuary Strain: Not on file  Food  Insecurity: No Food Insecurity (01/03/2023)   Hunger Vital Sign    Worried About Running Out of Food in the Last Year: Never true    Ran Out of Food in the Last Year: Never true  Transportation Needs: No Transportation Needs (01/03/2023)   PRAPARE - Administrator, Civil Service (Medical): No    Lack of Transportation (Non-Medical): No  Physical Activity: Not on file  Stress: Not on file  Social Connections: Not on file  Intimate Partner Violence: Not At Risk (01/03/2023)   Humiliation, Afraid, Rape, and Kick questionnaire    Fear of Current or Ex-Partner: No    Emotionally Abused: No    Physically Abused: No    Sexually Abused: No  Depression (PHQ2-9): Not on file  Alcohol Screen: Not on file  Housing: Low Risk (01/03/2023)   Housing    Last Housing Risk Score: 0  Utilities: Not At Risk (01/03/2023)   AHC Utilities    Threatened with loss of utilities: No  Health Literacy: Not on file    Family History  Problem Relation Age of Onset   Cancer Mother    Heart disease Father    Breast cancer Cousin    Heart attack Neg Hx     Current Outpatient Medications  Medication Sig Dispense Refill   albuterol  (VENTOLIN  HFA) 108 (90 Base) MCG/ACT inhaler INHALE 1-2 PUFFS BY MOUTH EVERY 6 HOURS AS NEEDED FOR WHEEZE OR SHORTNESS OF BREATH 18 each 3   amLODipine  (NORVASC ) 5 MG tablet Take 1 tablet by mouth daily.     apixaban  (ELIQUIS ) 5 MG TABS tablet TAKE 1 TABLET BY MOUTH TWICE A DAY 60 tablet 3   Calcium  Carb-Cholecalciferol  600-200 MG-UNIT TABS Take 1 tablet by mouth daily.     cetirizine  (ZYRTEC ) 10 MG tablet Take 10 mg by mouth every morning.     chlorhexidine  (PERIDEX ) 0.12 % solution USE AS DIRECTED PLEASE SPIT OUT     dorzolamide -timolol  (COSOPT ) 2-0.5 % ophthalmic solution Place 1 drop into the right eye 2 (two) times daily.     estradiol  (ESTRACE ) 0.1 MG/GM vaginal cream Place vaginally.     ferrous sulfate 325 (65 FE) MG tablet Take 325 mg by mouth every Monday,  Wednesday, and Friday.     fluticasone  (FLONASE ) 50 MCG/ACT nasal spray Place 1 spray into both nostrils 2 (two) times daily as needed for allergies.      fluticasone  furoate-vilanterol (BREO ELLIPTA ) 100-25 MCG/ACT  AEPB Inhale 1 puff into the lungs daily. 60 each 0   gabapentin  (NEURONTIN ) 300 MG capsule Take 1 capsule (300 mg total) by mouth at bedtime. 90 capsule 3   GEMTESA 75 MG TABS Take 1 tablet by mouth daily.     isosorbide  mononitrate (IMDUR ) 30 MG 24 hr tablet Take 1 tablet (30 mg total) by mouth daily. 30 tablet 11   latanoprost  (XALATAN ) 0.005 % ophthalmic solution Place 1 drop into both eyes at bedtime.     meclizine  (ANTIVERT ) 12.5 MG tablet Take 1 tablet (12.5 mg total) by mouth 3 (three) times daily as needed for dizziness (Vertigo). 30 tablet 0   metoprolol  succinate (TOPROL -XL) 25 MG 24 hr tablet Take 1 tablet (25 mg total) by mouth daily. 90 tablet 3   Multiple Vitamin (MULTIVITAMIN) tablet Take 1 tablet by mouth every morning.      nitroGLYCERIN  (NITROSTAT ) 0.4 MG SL tablet PLACE 1 TABLET UNDER TONGUE EVERY 5 MINUTES AS NEEDED FOR CHEST PAIN (UP TO 3 DOSES IN 15 MINUTES) 75 tablet 3   rosuvastatin  (CRESTOR ) 20 MG tablet TAKE 1 TABLET BY MOUTH EVERY DAY 90 tablet 3   solifenacin (VESICARE) 10 MG tablet      valACYclovir (VALTREX) 1000 MG tablet Take 1,000 mg by mouth 2 (two) times daily.     No current facility-administered medications for this visit.    Allergies[1]   REVIEW OF SYSTEMS:   [X]  denotes positive finding, [ ]  denotes negative finding Cardiac  Comments:  Chest pain or chest pressure:    Shortness of breath upon exertion:    Short of breath when lying flat:    Irregular heart rhythm:        Vascular    Pain in calf, thigh, or hip brought on by ambulation:    Pain in feet at night that wakes you up from your sleep:     Blood clot in your veins:    Leg swelling:         Pulmonary    Oxygen at home:    Productive cough:     Wheezing:          Neurologic    Sudden weakness in arms or legs:     Sudden numbness in arms or legs:     Sudden onset of difficulty speaking or slurred speech:    Temporary loss of vision in one eye:     Problems with dizziness:         Gastrointestinal    Blood in stool:     Vomited blood:         Genitourinary    Burning when urinating:     Blood in urine:        Psychiatric    Major depression:         Hematologic    Bleeding problems:    Problems with blood clotting too easily:        Skin    Rashes or ulcers:        Constitutional    Fever or chills:      PHYSICAL EXAMINATION:  Today's Vitals   04/14/24 1007  BP: (!) 149/99  Pulse: 71  Temp: 98 F (36.7 C)  TempSrc: Temporal  Weight: 149 lb (67.6 kg)  PainSc: 0-No pain   Body mass index is 23.34 kg/m.   General:  WDWN in NAD; vital signs documented above Gait: Not observed HENT: WNL, normocephalic Pulmonary: normal non-labored breathing Cardiac: regular HR, without  carotid bruits Abdomen: soft, NT; aortic pulse is not palpable Skin: without rashes Vascular Exam/Pulses:  Right Left  Radial 2+ (normal) 2+ (normal)  DP 2+ (normal) 2+ (normal)   Extremities: without open wounds; calves are equal in size, non tender to palpation bilaterally.   Musculoskeletal: no muscle wasting or atrophy  Neurologic: A&O X 3 Psychiatric:  The pt has Normal affect.   Non-Invasive Vascular Imaging on 04/14/2024:   +--------------------+--------+--------+------+--------+  Mesenteric         PSV cm/sEDV cm/sPlaqueComments  +--------------------+--------+--------+------+--------+  Aorta at SMA          132                           +--------------------+--------+--------+------+--------+  Aorta Prox            105                           +--------------------+--------+--------+------+--------+  Aorta Distal          112                           +--------------------+--------+--------+------+--------+   Celiac Artery Origin  391                           +--------------------+--------+--------+------+--------+  SMA Proximal          455                           +--------------------+--------+--------+------+--------+  SMA Mid               249                           +--------------------+--------+--------+------+--------+  SMA Distal            194                           +--------------------+--------+--------+------+--------+  CHA                  187                           +--------------------+--------+--------+------+--------+  Splenic              194                           +--------------------+--------+--------+------+--------+    Summary:  Mesenteric:  70 to 99% stenosis in the celiac artery and superior mesenteric artery.     ASSESSMENT/PLAN:: 89 y.o. female here for follow up for celiac and SMA stenosis   -still with elevated velocities in the celiac and SMA with normal IMA.  Pt is asymptomatic.   She and I had a long discussion about the symptoms of mesenteric ischemia and she has good understanding about post prandial pain, fear of food and weight loss.  We discussed that even if she comes in at the next visit and the velocities are higher and she remains asymptomatic, we would not intervene.  Given her good understanding, she will follow up as needed and call us  if she develops any of the above symptoms.   -a  referral to dermatology has been placed to establish care and treatment.    Lucie Apt, [facility] [phone]  Clinic MD:   Gretta     [1]  Allergies Allergen Reactions   Other Shortness Of Breath    Pt states she has asthma and seafood causes breathing problems.    Shellfish Allergy Shortness Of Breath    Pt states she has asthma and seafood causes breathing problems.   Iodine Other (See Comments)    REACTION:  UNSURE TOLD TO LIST AS ALLERGEN BY MD REACTION:  UNSURE TOLD TO  LIST AS ALLERGEN BY MD   Latex Itching and Rash   Methocarbamol Other (See Comments)   Pravastatin Other (See Comments)   Adhesive [Tape] Rash   "

## 2024-04-15 ENCOUNTER — Telehealth: Payer: Self-pay | Admitting: Cardiology

## 2024-04-15 ENCOUNTER — Other Ambulatory Visit: Payer: Self-pay | Admitting: Urology

## 2024-04-15 NOTE — Telephone Encounter (Signed)
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 Morene Salines Surgeon's Group or Practice Name:  [facility] Phone number:  [phone]  Fax number:  [phone]   Type of Clearance Requested:   - Medical  - Pharmacy:  Hold Apixaban  (Eliquis )     Type of Anesthesia:  General    Additional requests/questions:  Caller Preston) stated patient will need to stop Eliquis  3 days before surgery.    Signed, Jasmin B Wilson   04/15/2024, 11:07 AM   "

## 2024-04-20 ENCOUNTER — Other Ambulatory Visit: Payer: Self-pay | Admitting: Internal Medicine

## 2024-04-21 NOTE — Telephone Encounter (Signed)
 Courtesy refill. Pt must keep appt on 04/29/24 with Dr. Geronimo for further refills.

## 2024-04-24 ENCOUNTER — Other Ambulatory Visit: Payer: Self-pay

## 2024-04-24 MED ORDER — NITROGLYCERIN 0.4 MG SL SUBL: 0.4000 mg | SUBLINGUAL_TABLET | SUBLINGUAL | 0 refills | Status: AC | PRN

## 2024-04-27 ENCOUNTER — Ambulatory Visit: Admitting: Cardiology

## 2024-04-27 NOTE — Telephone Encounter (Signed)
 Patient with diagnosis of afib on Eliquis  for anticoagulation.    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 The patient's score is based upon: CHF History: 0 HTN History: 1 Diabetes History: 0 Stroke History: 0 Vascular Disease History: 1 Age Score: 2 Gender Score: 1      CrCl 31 ml/min Platelet count 266  Patient has not had an Afib/aflutter ablation in the last 3 months, DCCV within the last 4 weeks or a watchman implanted in the last 45 days   Per office protocol, patient can hold Eliquis  for 3 days prior to procedure.    **This guidance is not considered finalized until pre-operative APP has relayed final recommendations.**

## 2024-04-28 NOTE — Telephone Encounter (Signed)
" ° °  Name: Anne [name]  DOB: Aug 30, 1930  MRN: [id number]  Primary Cardiologist: Oneil Parchment, MD  Chart reviewed as part of pre-operative protocol coverage.  Date of surgery: 05/13/24 Next OV: 05/06/24 w Katlyn West, NP  Clearance can be addressed at upcoming office visit. Will fwd note to provider. PharmD recommendations: Per office protocol, patient can hold Eliquis  for 3 days prior to procedure.   [name], PA-C  04/28/2024, 11:39 AM  "

## 2024-04-29 ENCOUNTER — Ambulatory Visit: Admitting: Internal Medicine

## 2024-04-29 ENCOUNTER — Encounter: Payer: Self-pay | Admitting: Internal Medicine

## 2024-04-29 VITALS — BP 138/66 | HR 60 | Ht 67.0 in | Wt 149.0 lb

## 2024-04-29 DIAGNOSIS — J4530 Mild persistent asthma, uncomplicated: Secondary | ICD-10-CM

## 2024-04-29 DIAGNOSIS — Z9109 Other allergy status, other than to drugs and biological substances: Secondary | ICD-10-CM

## 2024-04-29 DIAGNOSIS — R0982 Postnasal drip: Secondary | ICD-10-CM

## 2024-04-29 DIAGNOSIS — Y93E6 Activity, residential relocation: Secondary | ICD-10-CM

## 2024-04-29 MED ORDER — AZELASTINE HCL 0.1 % NA SOLN: 2.0000 | Freq: Two times a day (BID) | NASAL | 2 refills | Status: AC

## 2024-04-29 NOTE — Patient Instructions (Addendum)
"    ICD-10-CM   1. Mild persistent asthma without complication  J45.30     2. Environmental allergies  Z91.09     3. Post-nasal drip  R09.82     4. Activity, residential relocation  Y93.E6      Mild persistent asthma without complication Environmental allergies  - stable  Plan  - continue scheduled breo  Post-nasal drip  - lot of post nasal drip on exam  Plan - contnue flonase  scheduled -start astelin  nasal spray daily  Activity, residential relocation  Plan  - call [employer] - [phone] the landlord for apartments on [address] of breath overall stable and asthma under good control CXR 03/29/22 clear   Follow-up - Visit Dr Geronimo - 6-9 months or sooner if needed "

## 2024-04-29 NOTE — Progress Notes (Signed)
 "       HPI Chief Complaint  Patient presents with   Acute Visit    cough     Referring provider: Roseann Coad, MD  HPI: 89 year old female never smoker followed for mild persistent asthma History of diastolic dysfunction Known chronic right middle lobe collapse/ lymphadenopathy -s/p 07/2007 FOB +psuedomonas   Test Exhaled nitric oxide  test May 2018: 17 ppb High resolution CT chest August 2016 showed no evidence of interstitial lung disease, chronic collapse of right middle lobe MEdiastinaoscopy 07/27/2009 - Benign node of 4R and 4L. ANthracotoic pigment +. No granuoma. Non malignant PFT 10/28/2014 FEV1 94, ratio 54, FVC 135, no significant bronchodilator response, mid flows reversibility, DLCO 65%    04/25/2017 Acute OV : Asthma  Patient presents for an acute office visit.  She complains of 3 weeks of cough ,congestion sinus congestion, drainage , wheezing . Seen PCP dx w/ bronchitis , rx  , Doxycycline and Prednisone  . Feels some better but still has chest congestion and tightness .  She remains on Breo daily.    Appetite is okay w/ no nv/d.  No chest pain, orthopnea or ede   OV 06/05/2017  Chief Complaint  Patient presents with   Follow-up    Pt states she has been doing good since last visit. Pt states she does become SOB with exertion. Denies any cough or CP.    Fu Mild persiostent asthma with associated diastolic dysfunction  Contines on broe. Overall stable. But last  4 month has has had 2 surgeries for hammer toe on left food. So less active. Correlating with this more dyspnea on exertion than usual. Also, found to have mold in the bathroom and professionally removed . Has not affected her but she is asking for feno and walk test both of which aare normal. She wants sample breo       OV 12/17/2017  Subjective:  Patient ID: Anne [name], female , DOB: 1930/09/21 , age 69 y.o. , MRN: [id number] , ADDRESS: [address] [place] [address]   12/17/2017  -   Chief Complaint  Patient presents with   Follow-up    Pt states she has been doing well since last visit and denies any complaints.   Fu Mild persiostent asthma with associated diastolic dysfunction  HPI Anne [name] 89 y.o. -  Mild persistent asthma follow-up. Recently she was switched to Brio on Qvar . This is helped his symptoms. In addition she is in the mold in the house.She feels great. She is somewhat she has done well. No interim asthma exacerbations. She will have a high dose flu shot today. There are really no other issues other than some mild recurrence of costochondral chest pain in the last few days is a recurrent problem for her.     Results for [name], Anne K (MRN [id number]) as of 06/05/2017 14:14  Ref. Range 08/07/2016 12:01 02/12/2017 11:30 06/05/2017 13:04  Nitric Oxide  Unknown 17 18 20    OV 01/15/2019  Subjective:  Patient ID: Anne [name], female , DOB: 1930/04/17 , age 43 y.o. , MRN: [id number] , ADDRESS: [address] [place] [address]   01/15/2019 -   Chief Complaint  Patient presents with   Follow-up    Pt states her breathing is doing well. Pt denies any increase in SOB, cough, CP/tightness, f/c/s, body aches.    89 year old female never smoker followed for mild persistent asthma History of diastolic dysfunction Known chronic right middle lobe collapse/ lymphadenopathy -s/p  07/2007 FOB +psuedomonas - MEdiastinaoscopy 07/27/2009 - Benign node of 4R and 4L. ANthracotoic pigment +. No granuoma. Non malignant   HPI Anne [name] 89 y.o. -presents for follow-up.  Last seen September 2019 by myself.  In the interim visit with the nurse practitioner.  She says the Brio works well for her.  She says asthma is really well controlled.  No new symptoms.  She is up-to-date with the flu shot.  She is extremely active.  She is social distancing and isolating quite well and only doing low risk COVID-19 activities.  There are no new complaints or issues.  OV  08/17/2019  Subjective:  Patient ID: Anne [name], female , DOB: February 05, 1931 , age 7 y.o. , MRN: [id number] , ADDRESS: [address] [place] [address]   08/17/2019 -   Chief Complaint  Patient presents with   Follow-up    Pt states she has been doing well since last visit and denies any complaints.   Female never smoker followed for mild persistent asthma  History of diastolic dysfunction  Known chronic right middle lobe collapse/ lymphadenopathy -s/p 07/2007 FOB +psuedomonas - MEdiastinaoscopy 07/27/2009 - Benign node of 4R and 4L. ANthracotoic pigment +. No granuoma. Non malignant  - last cxr 2019 and clear  HPI Anne [name] 89 y.o. -presents for follow-up.  Since last visit she has been doing well.  She has not had a Covid vaccine.  She continues social distancing.  She continues Breo.  She does not want to downsize the inhaler because of Covid.  She feels she needs extra protection.  She wants to maintain her health.  She is glad she has done well so far overall.  Her last chest x-ray was in 2019 she is willing to have another chest x-ray.  In terms of asthma asthma control question is 0 out of 5.  She not waking up in the middle of the night when she wakes up she has no problems she not having any activity limitation because of asthma.  She had wheezing because of asthma she not having any shortness of breath because of asthma she not using albuterol  for rescue.     ROS - per HPI  OV 05/06/2020  Subjective:  Patient ID: Anne [name], female , DOB: 01-25-1931 , age 89 y.o. , MRN: [id number] , ADDRESS: [address] [place] [address] PCP Anne Coad, MD Patient Care Team: Anne Coad, MD as PCP - General (Internal Medicine) Anne Kuba, MD as Consulting Physician (Orthopedic Surgery) Anne Tonita MARLA, DO as Consulting Physician (Neurology)  This Provider for this visit: Treatment Team:  Attending Provider: Geronimo Amel, MD    05/06/2020 -   Chief  Complaint  Patient presents with   Follow-up    No compaints   Female never smoker followed for mild persistent asthma  History of diastolic dysfunction  Known chronic right middle lobe collapse/ lymphadenopathy -s/p 07/2007 FOB +psuedomonas - MEdiastinaoscopy 07/27/2009 - Benign node of 4R and 4L. ANthracotoic pigment +. No granuoma. Non malignant  - last cxr 2019 and clear   HPI Anne [name] 89 y.o. -returns for follow-up for her mild persistent asthma.  She is doing well.  ACT control score is 25.  She was asking about a chest x-ray but I reminded her that in May 2021 she had a chest x-ray that was clear.  I showed her the film.  She was satisfied with that.  There are no new issues she wants  refills on her inhalers.  She is social distancing, masking and has had a vaccines.  She continues to be active.  ACT score is 25 and shows good asthma control.    Asthma Control Test ACT Total Score  05/06/2020 25       03/09/2021 Follow up : Asthma  Patient complains of 3 days of increased cough and congestion. Mucus is thick and hard to get up but remains clear. Remains active. Drives and lives alone at home . Has some mild sinus drainage  Remains on BREO daily . No increased Albuterol  inhaler use. No wheezing .  No fever,edema , abd pain , n/v/d. Does have some epigastric burning from time to time. No dysphagia.   Recent cardiac workup last month . Echo 02/20/21 showed EF 60%, Grade 1 DD ,Normal PAP  Chest xray nad Coronary CT with no lung issues , felt CAD stable -no significant stenosis per cards note.  Placed on PPI from PCP but has not started. Cardiac monitor to be placed.  TEST/EVENTS :  Exhaled nitric oxide  test May 2018: 17 ppb High resolution CT chest August 2016 showed no evidence of interstitial lung disease, chronic collapse of right middle lobe MEdiastinaoscopy 07/27/2009 - Benign node of 4R and 4L. ANthracotoic pigment +. No granuoma. Non malignant PFT 10/28/2014 FEV1  94, ratio 54, FVC 135, no significant bronchodilator response, mid flows reversibility, DLCO 65%  OV 06/16/2021  Subjective:  Patient ID: Anne [name], female , DOB: Oct 23, 1930 , age 20 y.o. , MRN: [id number] , ADDRESS: [address] [place] [address] PCP Anne Coad, MD Patient Care Team: Anne Coad, MD as PCP - General (Internal Medicine) Claudene Victory ORN, MD as PCP - Cardiology (Cardiology) Anne Kuba, MD as Consulting Physician (Orthopedic Surgery) Anne Tonita POUR, DO as Consulting Physician (Neurology)  This Provider for this visit: Treatment Team:  Attending Provider: Malachy Comer GAILS, NP    06/16/2021 -   Chief Complaint  Patient presents with   Follow-up    Pt states she is still having complaints of SOB. Has an occasional cough mainly at night but not of any concern. Denies any complaints of wheezing.   Female never smoker followed for mild persistent asthma  History of diastolic dysfunction  Known chronic right middle lobe collapse/ lymphadenopathy -s/p 07/2007 FOB +psuedomonas - MEdiastinaoscopy 07/27/2009 - Benign node of 4R and 4L. ANthracotoic pigment +. No granuoma. Non malignant  - last cxr 2019 and clear  HPI Anne [name] 89 y.o. -presents for an acute visit.  I personally saw her in February 2022 for her mild asthma and she was stable.  After that she saw a nurse practitioner Madelin Passy in December 2022 for congestion over 3 days and was given 3 days prednisone .  Meanwhile she is continued her Breo.  However she tells me that ever since Thanksgiving 2022 she has been dealing with congestion and chest that she describes it as a burning and a pain.  Its not present daily.  It is present intermittently on and off.  Definitely she feels different.  Yet at the same time she states she has no wheezing or cough.  She does have some paroxysmal nocturnal dyspnea.  The most recent one was last night but she is unable to quantify how severe this is.   She also admits to acid reflux but then she says this is on and off.  She is not able to tell me if this is worse than before.  She also reports shortness of breath.  She says that it is definitely worse than a year ago but at the same time she tells me the shortness of breath is present on and off.  She tells me that she has been sedentary since the pandemic and not being exercising at the [facility].  She also tells me that she has no shortness of breath with doing things around the house or walking up the stairs or walking around the house or changing clothes.  I did ask her how she can describe them if the shortness of breath is worse.  She says she is more anxious. lapse.  No pulmonary embolism.  No ILD on that. She has had 2 CT scans of the chest since November 2022.  I personally visualized the February 2023 1.  Basically stable with small right middle lobe col  Labs include an elevated BNP in February 2023 but a normal proBNP in 2018.  Most recent echocardiogram in November 2022 is a grade 1 diastolic dysfunction which is her baseline  06/09/2021 she had cardiac stress test that shows LV was hyperdynamic.  Low risk study.  She has been reassured  According to history Dr. Claudene is reassured her and asked her to visit pulmonary.  She just continues her inhaler.  Today we did nitric oxide  exhaled test.  This is again normal at 12 ppb.  Asthma Control Test ACT Total Score  05/06/2020 25     Lab Results  Component Value Date   NITRICOXIDE 20 06/05/2017    12 06/16/2021      CT Chest data feb 2023  CLINICAL DATA:  Shortness of breath, RIGHT-sided chest discomfort. Unilateral leg swelling. Previous lung nodules. Rule out pulmonary embolism.   EXAM: CT ANGIOGRAPHY CHEST WITH CONTRAST   TECHNIQUE: Multidetector CT imaging of the chest was performed using the standard protocol during bolus administration of intravenous contrast. Multiplanar CT image reconstructions and MIPs were obtained  to evaluate the vascular anatomy.   RADIATION DOSE REDUCTION: This exam was performed according to the departmental dose-optimization program which includes automated exposure control, adjustment of the mA and/or kV according to patient size and/or use of iterative reconstruction technique.   CONTRAST:  63mL OMNIPAQUE  IOHEXOL  350 MG/ML SOLN   COMPARISON:  Chest CT dated 11/19/2014. Coronary CT dated 02/21/2021.   FINDINGS: Cardiovascular: There is no pulmonary embolism identified within the main, lobar or segmental pulmonary arteries bilaterally.   No thoracic aortic aneurysm or evidence of aortic dissection. Scattered aortic atherosclerosis. Three-vessel coronary artery calcifications. No pericardial effusion.   Mediastinum/Nodes: No mass or enlarged lymph nodes are seen within the mediastinum or perihilar regions. Esophagus is unremarkable. Trachea and central bronchi are unremarkable.   Lungs/Pleura: Focal chronic atelectasis within the RIGHT perihilar lung. Chronic pleuroparenchymal scarring/fibrosis at the lung apices. Lungs are otherwise clear. No pleural effusion or pneumothorax.   Upper Abdomen: Limited images of the upper abdomen are unremarkable. LEFT renal cyst.   Musculoskeletal: Mild degenerative spondylosis of the thoracic spine. No acute-appearing osseous abnormality.   Review of the MIP images confirms the above findings.   IMPRESSION: 1. No acute findings. No pulmonary embolism. No thoracic aortic aneurysm or evidence of aortic dissection. 2. Three-vessel coronary artery calcifications. 3. Focal chronic atelectasis within the RIGHT perihilar lung. Lungs are otherwise clear. No evidence of pneumonia or pulmonary edema.   Aortic Atherosclerosis (ICD10-I70.0).     Electronically Signed   By: Lael Hines M.D.   On: 05/06/2021 19:25  No results found.    07/14/2021: Today-follow-up Patient presents today for follow-up after pulmonary function  testing. She has a very mild obstructive airway disease with increased lung volumes and without significant bronchodilator response. No evidence of restriction. Today, she reports feeling well. Improved since last visit. Still having some SOB upon exertion, progressive over the past year. Very minimal wheeze at time. Cough has improved. Denies any leg swelling, orthopnea, PND, chest pain. She continues on Breo daily. She did receive a letter from her insurance company who said they do not cover Breo anymore. She rarely has to use albuterol . Continues on Zyrtec  daily for allergies, which are well controlled.    Latest Reference Range & Units 06/16/21 10:12  Sheep Sorrel IgE kU/L <0.10  Pecan/Hickory Tree IgE kU/L 0.11 (H)  IgE (Immunoglobulin E), Serum <OR=114 kU/L <OR=114 kU/L 314 (H) 288 (H)  Allergen, D pternoyssinus,d7 kU/L <0.10  Cat Dander kU/L <0.10  Dog Dander kU/L <0.10  Bermuda Grass kU/L <0.10  Johnson Grass kU/L <0.10  Timothy Grass kU/L <0.10  Cockroach kU/L <0.10  Aspergillus fumigatus, m3 kU/L 0.11 (H)  Allergen, Comm Silver Valrie, t9 kU/L <0.10  Allergen, Cottonwood, t14 kU/L 0.16 (H)  Elm IgE kU/L 0.16 (H)  Allergen, Mulberry, t76 kU/L <0.10  Allergen, Oak,t7 kU/L 0.10 (H)  COMMON RAGWEED (SHORT) (W1) IGE kU/L <0.10  Allergen, Mouse Urine Protein, e78 kU/L <0.10  D. farinae kU/L <0.10  Allergen, Cedar tree, t12 kU/L <0.10  Box Elder IgE kU/L <0.10  Rough Pigweed  IgE kU/L <0.10  (H): Data is abnormally high   OV 09/05/2021  Subjective:  Patient ID: Anne [name], female , DOB: July 28, 1930 , age 53 y.o. , MRN: [id number] , ADDRESS: [address] [place] [address] PCP Anne Coad, MD Patient Care Team: Anne Coad, MD as PCP - General (Internal Medicine) Claudene Victory ORN, MD as PCP - Cardiology (Cardiology) Anne Kuba, MD as Consulting Physician (Orthopedic Surgery) Patel, Donika K, DO as Consulting Physician (Neurology)  This Provider for this  visit: Treatment Team:  Attending Provider: Geronimo Amel, MD    09/05/2021 -   Chief Complaint  Patient presents with   Follow-up    Pt states she has been doing okay since last visit.    HPI Anne [name] 89 y.o. -presents for follow-up.  After last visit because of worsening dyspnea on exertion we ordered RAST allergy panel.  This shows slightly high IgE level and some positivity to environmental allergies.  Presents for follow-up.  She then saw nurse practitioner.  Who started on Trelegy but she called back saying this did not help.  At this point in time she states that she is back to baseline but she still wanting to know if I recommended Trelegy.  I did advise that there is no point in trying Trelegy again.  Explained to her what Trelegy is compared to [facility].  Explained that because she did not feel better she is unlikely to feel better again with a rechallenge.  Explained the multifactorial nature of her dyspnea.  Did explain to her positive allergies.  We did discuss that be options such as Xolair if she were to get worse and having recurrent exacerbations or even Singulair.  OV 05/01/2022  Subjective:  Patient ID: Anne [name], female , DOB: 05-26-1930 , age 26 y.o. , MRN: [id number] , ADDRESS: [address] [place] [address] PCP Dwight Trula SQUIBB, MD Patient Care Team: Dwight Trula SQUIBB, MD as PCP -  General (Internal Medicine) Claudene Victory ORN, MD as PCP - Cardiology (Cardiology) Anne Kuba, MD as Consulting Physician (Orthopedic Surgery) Patel, Donika K, DO as Consulting Physician (Neurology)  This Provider for this visit: Treatment Team:  Attending Provider: Geronimo Amel, MD    05/01/2022 -   Chief Complaint  Patient presents with   Follow-up    Breathing has improved since her last visit. She prefers [employer] over [employer]. ACT= 19.      HPI Anne [name] 39 y.o. -returns for follow-up.  In December 2023 she had shellfish reaction with wheezing  was given prednisone .  March 21, 2022 she saw Dr. Leonor Poisson.  Chest x-ray I reviewed from that day was clear.  Currently she is feeling well.  She is back on her Breo.  She has no issues.  She is up-to-date with the vaccines except RSV.  Educated about RSV vaccine.  She is not taking Mucinex  and overall feeling stable.   OV 11/23/2022  Subjective:  Patient ID: Anne [name], female , DOB: June 15, 1930 , age 5 y.o. , MRN: [id number] , ADDRESS: [address] [place] [address] PCP Debarah Rexene Cruel, DO Patient Care Team: Debarah Rexene Cruel, DO as PCP - General (Family Medicine) Jeffrie Oneil BROCKS, MD as PCP - Cardiology (Cardiology) Anne Kuba, MD as Consulting Physician (Orthopedic Surgery) Patel, Donika K, DO as Consulting Physician (Neurology)  This Provider for this visit: Treatment Team:  Attending Provider: Geronimo Amel, MD    11/23/2022 -   Chief Complaint  Patient presents with   Follow-up    F/up, questions about inhalers.       HPI Tomeika [name] 89 y.o. -last seen in January 2024.  Then she called in April 2024 wanting to switch to Trelegy.  We switched her to Trelegy low-dose.  She comes back now saying [facility] status: No new complaints No new medical problems. No new surgeries. No ER visits. No Urgent care visits. No changes to medications.  She is asking to go back on Breo.  She says she did not find the Trelegy particularly helpful.  She also feels it was a little bit too excessive.  She is also nervous at the same time of going back to [facility] saying that she worries of asthma we will go out of control.  I did tell her that so many options available and scaling up and down is definitely possible based on her symptoms.  Based on this she is willing to go back on Breo.  We discussed high dose versus low-dose.  Previously she was on 100 send which is a low-dose.  Will go back to that.   OV 04/29/2024  Subjective:  Patient ID: Anne [name],  female , DOB: 08-Jun-1930 , age 47 y.o. , MRN: [id number] , ADDRESS: [address] [place] [address] PCP Dwight Trula SQUIBB, MD Patient Care Team: Dwight Trula SQUIBB, MD as PCP - General (Internal Medicine) Jeffrie Oneil BROCKS, MD as PCP - Cardiology (Cardiology) Anne Kuba, MD as Consulting Physician (Orthopedic Surgery) Patel, Donika K, DO as Consulting Physician (Neurology)  This Provider for this visit: Treatment Team:  Attending Provider: Geronimo Amel, MD    04/29/2024 -   Chief Complaint  Patient presents with   Medical Management of Chronic Issues   Asthma    Breathing has been doing well overall. She denies any new co's.    Female never smoker followed for mild persistent asthma  - RAST allergy test positive March  2023  History of diastolic dysfunction  Known chronic right middle lobe collapse/ lymphadenopathy -s/p 07/2007 FOB +psuedomonas - MEdiastinaoscopy 07/27/2009 - Benign node of 4R and 4L. ANthracotoic pigment +. No granuoma. Non malignant  -present on CT Feb 2023   Multifactorial dyspnea on exertion  Shellfish allergy with respiratory wheezing December 2023  Post nasal drip  HPI Anne [name] 89 y.o. - Anne [name] is a 89 year old female who presents for follow-up regarding her pulmonary health. Lst seen x 18 months ago apprrox  She reports that her breathing has been good and she continues to use Breo as prescribed. She started using Flonase  after experiencing a head cold approximately two weeks ago, using it on an as-needed basis to alleviate nasal congestion.  She describes a head cold that lasted about a week, from which she has recently recovered. She experiences post-nasal drip and drainage into her throat, which she attributes to the recent cold.  She reports a cyst in her bladder, for which she has undergone a CT scan. She is scheduled for a cystoscopy and feels anxious about the procedure.  She lives alone following the passing of her  husband and is considering selling her house. She discusses housing options with her cousin, who also lives alone   PFT     Latest Ref Rng & Units 07/14/2021   10:54 AM 10/28/2014   10:13 AM  PFT Results  FVC-Pre L 2.69  2.83   FVC-Predicted Pre % 130  123   FVC-Post L 2.70  3.13   FVC-Predicted Post % 131  135   Pre FEV1/FVC % % 61  57   Post FEV1/FCV % % 66  54   FEV1-Pre L 1.64  1.61   FEV1-Predicted Pre % 102  90   FEV1-Post L 1.78  1.68   DLCO uncorrected ml/min/mmHg 17.31  18.63   DLCO UNC% % 85  65   DLCO corrected ml/min/mmHg 18.08    DLCO COR %Predicted % 89    DLVA Predicted % 96  72   TLC L 6.36  6.74   TLC % Predicted % 115  122   RV % Predicted % 137  146        LAB RESULTS last 96 hours No results found.    Latest Reference Range & Units 07/04/07 10:53 05/31/12 09:31 09/05/14 11:07 01/14/17 14:53 05/31/19 21:20 05/06/21 13:47 06/16/21 10:12 07/04/22 16:30 01/03/23 10:55 01/05/23 05:51  Eosinophils Absolute 0.0 - 0.5 K/uL 0.5 0.1 0.3 0.2 0.2 0.2 0.2 0.1 0.0 0.0      has a past medical history of Allergic rhinitis, Arthritis, Asthma, Atypical chest pain, Collapse of right lung, Dyslipidemia, Gastroesophageal reflux disease, Glaucoma, Hematuria, Hyperlipidemia, Hypertension, Lung nodules, Peripheral vascular disease, PVC's (premature ventricular contractions) (02/21/2021), and Sinus bradycardia (02/21/2021).   reports that she quit smoking about 66 years ago. Her smoking use included cigarettes. She started smoking about 73 years ago. She has never used smokeless tobacco.  Past Surgical History:  Procedure Laterality Date   ANAL FISSURE REPAIR     BREAST BIOPSY Right    CATARACT EXTRACTION Bilateral    MASS EXCISION  04/11/2012   Procedure: EXCISION MASS;  Surgeon: Arley JONELLE Curia, MD;  Location: [facility];  Service: Orthopedics;  Laterality: Right;  Excision of mass right upper arm   MEDIASTINOSCOPY  4/11   NASAL SINUS SURGERY     TOTAL  ABDOMINAL HYSTERECTOMY W/ BILATERAL SALPINGOOPHORECTOMY  Allergies[1]  Immunization History  Administered Date(s) Administered   Fluzone Influenza virus vaccine,trivalent (IIV3), split virus 12/21/2009, 01/09/2017, 12/25/2017   INFLUENZA, HIGH DOSE SEASONAL PF 01/12/2015, 01/18/2016, 12/31/2016, 12/17/2017, 12/02/2018, 12/31/2018, 12/08/2019   Influenza Whole 04/04/2009   Influenza,inj,Quad PF,6+ Mos 01/03/2015, 12/02/2015   Influenza-Unspecified 02/14/2014, 01/13/2021   PFIZER Comirnaty(Gray Top)Covid-19 Tri-Sucrose Vaccine 09/01/2020   PFIZER(Purple Top)SARS-COV-2 Vaccination 04/24/2019, 05/15/2019, 12/29/2019   PNEUMOCOCCAL CONJUGATE-20 02/12/2024   Pneumococcal Conjugate-13 01/12/2015   Pneumococcal Polysaccharide-23 08/04/1997, 06/01/2003, 01/30/2017   Pneumococcal-Unspecified 01/03/2015   Tdap 09/20/2015   Zoster, Live 07/06/2013, 01/11/2020, 07/11/2020    Family History  Problem Relation Age of Onset   Cancer Mother    Heart disease Father    Breast cancer Cousin    Heart attack Neg Hx     Current Medications[2]      Objective:   Vitals:   04/29/24 1543  BP: 138/66  Pulse: 60  SpO2: 100%  Weight: 149 lb (67.6 kg)  Height: 5' 7 (1.702 m)    Estimated body mass index is 23.34 kg/m as calculated from the following:   Height as of this encounter: 5' 7 (1.702 m).   Weight as of this encounter: 149 lb (67.6 kg).  @WEIGHTCHANGE @  [employer]   04/29/24 1543  Weight: 149 lb (67.6 kg)     Physical Exam   General: No distress. Looks well O2 at rest: no Cane present: no Sitting in wheel chair: non Frail: o Obese: no Neuro: Alert and Oriented x 3. GCS 15. Speech normal Psych: Pleasant Resp:  Barrel Chest - no.  Wheeze - no, Crackles - no, No overt respiratory distress CVS: Normal heart sounds. Murmurs - no Ext: Stigmata of Connective Tissue Disease - no HEENT: Normal upper airway. PEERL +. No post nasal drip        Assessment/      Assessment & Plan Mild persistent asthma without complication  Environmental allergies  Post-nasal drip  Activity, residential relocation    PLAN Patient Instructions     ICD-10-CM   1. Mild persistent asthma without complication  J45.30     2. Environmental allergies  Z91.09     3. Post-nasal drip  R09.82     4. Activity, residential relocation  Y93.E6      Mild persistent asthma without complication Environmental allergies  - stable  Plan  - continue scheduled breo  Post-nasal drip  - lot of post nasal drip on exam  Plan - contnue flonase  scheduled -start astelin  nasal spray daily  Activity, residential relocation  Plan  - call [employer] - [phone] the landlord for apartments on [address] of breath overall stable and asthma under good control CXR 03/29/22 clear   Follow-up - Visit Dr Anne - 6-9 months or sooner if needed    FOLLOWUP    Return for - Visit Dr Anne - 6-9 months or sooner if needed.    SIGNATURE    Dr. Dorethia Anne, M.D., F.C.C.P,  Pulmonary and Critical Care Medicine Staff Physician, [facility] Director - Interstitial Lung Disease  Program  Pulmonary Fibrosis [facility] at [facility] [place], [place], [address]  Pager: [phone], If no answer or between  15:00h - 7:00h: call [phone] Telephone: [phone]  4:27 PM 04/29/2024    [1]  Allergies Allergen Reactions   Other Shortness Of Breath    Pt states she has asthma and seafood causes  breathing problems.    Shellfish Allergy Shortness Of Breath    Pt states she has asthma and seafood causes breathing problems.   Iodine Other (See Comments)    REACTION:  UNSURE TOLD TO LIST AS ALLERGEN BY MD REACTION:  UNSURE TOLD TO LIST AS ALLERGEN BY MD   Latex Itching and Rash   Methocarbamol Other (See Comments)   Pravastatin Other (See Comments)   Adhesive [Tape] Rash  [2]   Current Outpatient Medications:    albuterol  (VENTOLIN  HFA) 108 (90 Base) MCG/ACT inhaler, INHALE 1-2 PUFFS BY MOUTH EVERY 6 HOURS AS NEEDED FOR WHEEZE OR SHORTNESS OF BREATH, Disp: 18 each, Rfl: 0   amLODipine  (NORVASC ) 5 MG tablet, Take 5 mg by mouth daily., Disp: , Rfl:    apixaban  (ELIQUIS ) 5 MG TABS tablet, TAKE 1 TABLET BY MOUTH TWICE A DAY, Disp: 60 tablet, Rfl: 3   azelastine  (ASTELIN ) 0.1 % nasal spray, Place 2 sprays into both nostrils 2 (two) times daily. Use in each nostril as directed, Disp: 30 mL, Rfl: 2   dorzolamide -timolol  (COSOPT ) 2-0.5 % ophthalmic solution, Place 1 drop into the right eye 2 (two) times daily., Disp: , Rfl:    estradiol  (ESTRACE ) 0.1 MG/GM vaginal cream, Place 1 Applicatorful vaginally 2 (two) times a week., Disp: , Rfl:    fluticasone  (FLONASE ) 50 MCG/ACT nasal spray, Place 1 spray into both nostrils daily as needed for allergies., Disp: , Rfl:    fluticasone  furoate-vilanterol (BREO ELLIPTA ) 100-25 MCG/ACT AEPB, Inhale 1 puff into the lungs daily., Disp: 60 each, Rfl: 0   gabapentin  (NEURONTIN ) 300 MG capsule, Take 1 capsule (300 mg total) by mouth at bedtime., Disp: 90 capsule, Rfl: 3   GEMTESA 75 MG TABS, Take 75 mg by mouth daily., Disp: , Rfl:    isosorbide  mononitrate (IMDUR ) 30 MG 24 hr tablet, Take 1 tablet (30 mg total) by mouth daily., Disp: 30 tablet, Rfl: 11   latanoprost  (XALATAN ) 0.005 % ophthalmic solution, Place 1 drop into both eyes at bedtime., Disp: , Rfl:    meclizine  (ANTIVERT ) 12.5 MG tablet, Take 1 tablet (12.5 mg total) by mouth 3 (three) times daily as needed for dizziness (Vertigo)., Disp: 30 tablet, Rfl: 0   metoprolol  succinate (TOPROL -XL) 25 MG 24 hr tablet, Take 1 tablet (25 mg total) by mouth daily., Disp: 90 tablet, Rfl: 3   nitroGLYCERIN  (NITROSTAT ) 0.4 MG SL tablet, Place 1 tablet (0.4 mg total) under the tongue every 5 (five) minutes as needed for chest pain., Disp: 25 tablet, Rfl: 0   rosuvastatin  (CRESTOR ) 20 MG tablet, TAKE  1 TABLET BY MOUTH EVERY DAY, Disp: 90 tablet, Rfl: 3  "

## 2024-05-01 ENCOUNTER — Other Ambulatory Visit (HOSPITAL_COMMUNITY): Payer: Self-pay | Admitting: Physician Assistant

## 2024-05-01 ENCOUNTER — Telehealth: Payer: Self-pay

## 2024-05-01 DIAGNOSIS — R1032 Left lower quadrant pain: Secondary | ICD-10-CM

## 2024-05-01 NOTE — Telephone Encounter (Signed)
 Copied from CRM #[id number]. Topic: Appointments - Appointment Cancel/Reschedule >> Apr 29, 2024 10:04 AM Leila C wrote: Patient/patient representative is calling to cancel or reschedule an appointment. Refer to attachments for appointment information.  Patient [phone] states someone called and hung up before patient can speak. Patient states it's too late to drive back home, concerned with the weather. Patient wants to change appointment 04/29/24 at 3:45 pm with Dr. Geronimo to an earlier time for today. Informed patient, next available for Dr. Geronimo is 08/31/24. Per CAL, send a crm to clinical. Please call back.   ATC x1. Unable to leave vm due to full mailbox.  Routing to front staff to schedule ov.

## 2024-05-02 ENCOUNTER — Encounter (HOSPITAL_BASED_OUTPATIENT_CLINIC_OR_DEPARTMENT_OTHER): Payer: Self-pay

## 2024-05-02 ENCOUNTER — Ambulatory Visit (HOSPITAL_BASED_OUTPATIENT_CLINIC_OR_DEPARTMENT_OTHER): Admission: RE | Admit: 2024-05-02

## 2024-05-04 NOTE — Progress Notes (Signed)
 Date of any COVID positive Test in last 90 days:  PCP -  Trula Brim, MD Cardiologist - Oneil Parchment, MD,  Rosabel Mose, NP clearance 05-06-24  Pulmonology- Dorethia Cave, MD  Chest x-ray - 07-05-2023  2v EKG - 09-24-2023  Stress Test - Lexiscan  06-09-2021 ECHO - 03-21-2022 Cardiac Cath -  CT Coronary Calcium  score:  ZIO monitor- 03-07-2023  Pacemaker / ICD device [x]  No []  Yes   Spinal Cord Stimulator:[x]  No []  Yes       History of Sleep Apnea? [x]  No []  Yes   CPAP used?- [x]  No []  Yes    Medication on DOS: Metoprolol  succ. (Toprol -XL), amlodipine , (NORVASC ), Isosorbide  mono (IMDUR ), GEMTESA, Eye drops, Nasal Sprays, Albuterol  inhaler,   Patient has: [x]  NO Hx DM   []  Pre-DM   []  DM1  []   DM2 Does the patient monitor blood sugar?   [x]  N/A   []  No []  Yes   Blood Thinner / Instructions:  ELIQUIS   Hold x 72 hours  last dose Saturday 05-08-24. She does not need to stop prior to her CT abd / pelvis on 05/09/24 at [facility] (per [facility] on 05-06-24) Aspirin  Instructions:  none   Activity level: Able to walk up 2 flights of stairs without becoming significantly short of breath or having chest pain?   []    Yes   []  No,  would have:  Patient can perform ADLs without assistance.  []   Yes    []  No   Anesthesia review:  PAF, hx atypical CP, Collapse of right lung, glaucoma, asthma, stable Lung nodules, GERD, PVD  Patient denies any S&S of respiratory illness or Covid - no shortness of breath, fever, cough or chest pain at PAT appointment.  Patient verbalized understanding and agreement to the Pre-Surgical Instructions that were given to them at this PAT appointment. Patient was also educated of the need to review these PAT instructions again prior to his/her surgery.I reviewed the appropriate phone numbers to call if they have any and questions or concerns.

## 2024-05-05 ENCOUNTER — Encounter (HOSPITAL_COMMUNITY)
Admission: RE | Admit: 2024-05-05 | Discharge: 2024-05-05 | Disposition: A | Source: Ambulatory Visit | Attending: Urology

## 2024-05-05 DIAGNOSIS — I10 Essential (primary) hypertension: Secondary | ICD-10-CM

## 2024-05-05 DIAGNOSIS — Z01818 Encounter for other preprocedural examination: Secondary | ICD-10-CM

## 2024-05-06 ENCOUNTER — Encounter: Payer: Self-pay | Admitting: Cardiology

## 2024-05-06 ENCOUNTER — Ambulatory Visit: Admitting: Cardiology

## 2024-05-06 VITALS — BP 122/60 | HR 59 | Ht 67.0 in | Wt 148.2 lb

## 2024-05-06 DIAGNOSIS — E785 Hyperlipidemia, unspecified: Secondary | ICD-10-CM | POA: Diagnosis not present

## 2024-05-06 DIAGNOSIS — I10 Essential (primary) hypertension: Secondary | ICD-10-CM | POA: Diagnosis not present

## 2024-05-06 DIAGNOSIS — I4719 Other supraventricular tachycardia: Secondary | ICD-10-CM

## 2024-05-06 DIAGNOSIS — I2510 Atherosclerotic heart disease of native coronary artery without angina pectoris: Secondary | ICD-10-CM | POA: Diagnosis not present

## 2024-05-06 DIAGNOSIS — Z01810 Encounter for preprocedural cardiovascular examination: Secondary | ICD-10-CM | POA: Diagnosis not present

## 2024-05-06 DIAGNOSIS — I480 Paroxysmal atrial fibrillation: Secondary | ICD-10-CM | POA: Diagnosis not present

## 2024-05-06 NOTE — Patient Instructions (Signed)
 Medication Instructions:  Your physician recommends that you continue on your current medications as directed. Please refer to the Current Medication list given to you today.  *If you need a refill on your cardiac medications before your next appointment, please call your pharmacy*  Lab Work: TODAY: BMET, CBC If you have labs (blood work) drawn today and your tests are completely normal, you will receive your results only by: MyChart Message (if you have MyChart) OR A paper copy in the mail If you have any lab test that is abnormal or we need to change your treatment, we will call you to review the results.  Testing/Procedures: NONE  Follow-Up: At [facility], you and your health needs are our priority.  As part of our continuing mission to provide you with exceptional heart care, our providers are all part of one team.  This team includes your primary Cardiologist (physician) and Advanced Practice Providers or APPs (Physician Assistants and Nurse Practitioners) who all work together to provide you with the care you need, when you need it.  Your next appointment:   6 month(s)  Provider:   Oneil Parchment, MD

## 2024-05-07 ENCOUNTER — Ambulatory Visit: Payer: Self-pay | Admitting: Cardiology

## 2024-05-07 LAB — BASIC METABOLIC PANEL WITH GFR
BUN/Creatinine Ratio: 20 (ref 12–28)
BUN: 23 mg/dL (ref 10–36)
CO2: 22 mmol/L (ref 20–29)
Calcium: 9.4 mg/dL (ref 8.7–10.3)
Chloride: 106 mmol/L (ref 96–106)
Creatinine, Ser: 1.16 mg/dL — ABNORMAL HIGH (ref 0.57–1.00)
Glucose: 84 mg/dL (ref 70–99)
Potassium: 4.7 mmol/L (ref 3.5–5.2)
Sodium: 143 mmol/L (ref 134–144)
eGFR: 45 mL/min/{1.73_m2} — ABNORMAL LOW

## 2024-05-07 LAB — CBC
Hematocrit: 35.2 % (ref 34.0–46.6)
Hemoglobin: 11 g/dL — ABNORMAL LOW (ref 11.1–15.9)
MCH: 29.6 pg (ref 26.6–33.0)
MCHC: 31.3 g/dL — ABNORMAL LOW (ref 31.5–35.7)
MCV: 95 fL (ref 79–97)
Platelets: 299 10*3/uL (ref 150–450)
RBC: 3.72 x10E6/uL — ABNORMAL LOW (ref 3.77–5.28)
RDW: 14 % (ref 11.7–15.4)
WBC: 5.4 10*3/uL (ref 3.4–10.8)

## 2024-05-08 ENCOUNTER — Encounter (HOSPITAL_COMMUNITY): Payer: Self-pay

## 2024-05-08 NOTE — Progress Notes (Signed)
 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  Patient follows with Cardiology for hx of CAD treated medically and PAF on Eliquis . Last seen on 05/06/24 by NP West for pre op clearance.  Patient reported doing well overall and denies any symptoms. Patient remains active at home, reports that she regularly does sit ups and stretches in the morning when she first gets up, completes her own ADLs, walks, is able to climb stairs and complete all of her household chores.  Patient was cleared for surgery:  Preoperative cardiac evaluation: Patient is currently scheduled for transurethral resection of bladder tumor on 05/13/2024 with Dr. Morene Cam. Ms. Malina perioperative risk of a major cardiac event is 0.4% according to the Revised Cardiac Risk Index (RCRI).  Therefore, she is at low risk for perioperative complications.   Her functional capacity is good at 6.05 METs according to the Duke Activity Status Index (DASI). Recommendations:According to ACC/AHA guidelines, no further cardiovascular testing needed.  The patient may proceed to surgery at acceptable risk.  Antiplatelet and/or Anticoagulation Recommendations: Eliquis  (Apixaban ) can be held for 3 days prior to surgery.  Please resume post op when felt to be safe.  Patient follows with pulmonology for mild  asthma, chronic DOE, and chronic right middle lobe collapse. PFTs in 05/2021 with minimal obstructive airway disease. Last seen by Dr. Geronimo on 04/29/24.  She is using inhalers.  Breathing noted to be stable and asthma under good control.  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  Last seen on 04/14/2024.  Patient has been asymptomatic. Per PA Rhyne: She and I had a long discussion about the symptoms of mesenteric ischemia and she has good understanding about post prandial pain, fear of food and weight loss.  We discussed that even if she comes in at the next visit and the velocities are higher and she remains asymptomatic, we would not intervene.  LD Eliquis : 2/6  VS:  Wt Readings from Last 3 Encounters:  05/06/24 67.2 kg  04/29/24 67.6 kg  04/14/24 67.6 kg   Temp Readings from Last 3 Encounters:  04/14/24 36.7 C (Temporal)  10/15/23 36.6 C (Oral)  07/05/23 36.6 C (Oral)   BP Readings from Last 3 Encounters:  05/06/24 122/60  04/29/24 138/66  04/14/24 (!) 149/99   Pulse Readings from Last 3 Encounters:  05/06/24 (!) 59  04/29/24 60  04/14/24 71     PROVIDERS: Dwight Trula SQUIBB, MD   LABS: Labs reviewed: Acceptable for surgery. (all labs ordered are listed, but only abnormal results are displayed)  Labs Reviewed - No data to display   CTA Chest 05/06/2021:  IMPRESSION: 1. No acute findings. No pulmonary embolism. No thoracic aortic aneurysm or evidence of aortic dissection. 2. Three-vessel coronary artery calcifications. 3. Focal chronic atelectasis within the RIGHT perihilar lung.  Lungs are otherwise clear. No evidence of pneumonia or pulmonary edema.   Aortic Atherosclerosis (ICD10-I70.0).    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   Event monitoring 03/07/2023:    Sinus rhythm average heart rate 67 bpm   Brief atrial tachycardia-benign   Rare PACs/PVCs     Patch Wear Time:  13 days and 23 hours (2024-11-14T11:50:41-0500 to 2024-11-28T11:50:33-0500)   Patient had a min HR of 52 bpm, max HR of 113 bpm, and avg HR of 67 bpm. Predominant underlying rhythm was Sinus Rhythm. First Degree AV Block was present. Possible Atrial Tachycardia was present. 3 Supraventricular Tachycardia runs occurred, the run  with the fastest interval lasting 5 beats with a max rate of 113 bpm, the longest lasting 4 beats with an avg rate of 93 bpm. Isolated SVEs were rare (<1.0%), SVE Couplets were rare (<1.0%), and SVE Triplets were rare (<1.0%). Isolated VEs were rare  (<1.0%), and no VE Couplets or VE Triplets were present.  Echo 03/21/2022:  IMPRESSIONS    1. Left ventricular ejection fraction, by estimation, is 60 to 65%. The left ventricle has normal function. The left ventricle has no regional wall motion abnormalities. Left ventricular diastolic parameters were normal. The average left ventricular global longitudinal strain is -19.4 %. The global longitudinal strain is normal.  2. Right ventricular systolic function is normal. The right ventricular size is normal. There is mildly elevated pulmonary artery systolic pressure. The estimated right ventricular systolic pressure is 37.6 mmHg.  3. The mitral valve is normal in structure. Mild mitral valve regurgitation. No evidence of mitral stenosis.  4. Tricuspid valve regurgitation is moderate.  5. The aortic valve is tricuspid. There is mild calcification of the aortic valve. There is mild thickening of the aortic valve. Aortic valve regurgitation is not visualized. Aortic valve sclerosis is present, with no evidence of aortic valve stenosis.  6. The inferior vena cava is normal in size with greater than 50% respiratory variability,  suggesting right atrial pressure of 3 mmHg.  Stress test 06/09/2021:    The study is normal. The study is low risk.   No ST deviation was noted.   LV perfusion is normal. There is no evidence of ischemia. There is no evidence of infarction.   Left ventricular function is normal. Nuclear stress EF: 74 %. The left ventricular ejection fraction is hyperdynamic (>65%). End diastolic cavity size is normal. End systolic cavity size is normal.   Prior study available for comparison from 11/26/2014.   Fixed perfusion defect in apical inferior wall and apex with normal wall motion consistent with artifact Low risk study  Coronary CTA 02/21/2021:  IMPRESSION: 1. Moderate CAD, CADRADS = 3. CT FFR will be performed and reported separately.   2. Coronary calcium  score of 615. She is out of the age range for percentile comparison   3. Normal coronary origin with right dominance.   4.  Aortic atherosclerosis.  FFR:  IMPRESSION: 1.  CT FFR analysis did not show any significant stenosis.  Past Medical History:  Diagnosis Date   Allergic rhinitis    Arthritis    Asthma    Atypical chest pain    Collapse of right lung    Dyslipidemia    Gastroesophageal reflux disease    Glaucoma    Hematuria  Hyperlipidemia    Hypertension    Lung nodules    Peripheral vascular disease    PVC's (premature ventricular contractions) 02/21/2021   Sinus bradycardia 02/21/2021    Past Surgical History:  Procedure Laterality Date   ANAL FISSURE REPAIR     BREAST BIOPSY Right    CATARACT EXTRACTION Bilateral    MASS EXCISION  04/11/2012   Procedure: EXCISION MASS;  Surgeon: Arley JONELLE Curia, MD;  Location: [facility];  Service: Orthopedics;  Laterality: Right;  Excision of mass right upper arm   MEDIASTINOSCOPY  4/11   NASAL SINUS SURGERY     TOTAL ABDOMINAL HYSTERECTOMY W/ BILATERAL SALPINGOOPHORECTOMY      MEDICATIONS:  albuterol  (VENTOLIN  HFA) 108 (90 Base) MCG/ACT inhaler    amLODipine  (NORVASC ) 5 MG tablet   apixaban  (ELIQUIS ) 5 MG TABS tablet   azelastine  (ASTELIN ) 0.1 % nasal spray   dorzolamide -timolol  (COSOPT ) 2-0.5 % ophthalmic solution   estradiol  (ESTRACE ) 0.1 MG/GM vaginal cream   fluticasone  (FLONASE ) 50 MCG/ACT nasal spray   fluticasone  furoate-vilanterol (BREO ELLIPTA ) 100-25 MCG/ACT AEPB   gabapentin  (NEURONTIN ) 300 MG capsule   GEMTESA 75 MG TABS   isosorbide  mononitrate (IMDUR ) 30 MG 24 hr tablet   latanoprost  (XALATAN ) 0.005 % ophthalmic solution   meclizine  (ANTIVERT ) 12.5 MG tablet   metoprolol  succinate (TOPROL -XL) 25 MG 24 hr tablet   nitroGLYCERIN  (NITROSTAT ) 0.4 MG SL tablet   rosuvastatin  (CRESTOR ) 20 MG tablet   No current facility-administered medications for this encounter.   Burnard CHRISTELLA Odis DEVONNA [facility] Pre-Surgical Testing [facility] Phone [phone] 05/08/2024 10:59 AM         "

## 2024-05-08 NOTE — Anesthesia Preprocedure Evaluation (Signed)
"                                    Anesthesia Evaluation    Airway        Dental   Pulmonary former smoker          Cardiovascular hypertension,      Neuro/Psych    GI/Hepatic   Endo/Other    Renal/GU      Musculoskeletal   Abdominal   Peds  Hematology   Anesthesia Other Findings   Reproductive/Obstetrics                              Anesthesia Physical Anesthesia Plan  ASA:   Anesthesia Plan:    Post-op Pain Management:    Induction:   PONV Risk Score and Plan:   Airway Management Planned:   Additional Equipment:   Intra-op Plan:   Post-operative Plan:   Informed Consent:   Plan Discussed with:   Anesthesia Plan Comments: (See PAT note from 2/3)         Anesthesia Quick Evaluation  "

## 2024-05-09 ENCOUNTER — Ambulatory Visit (HOSPITAL_BASED_OUTPATIENT_CLINIC_OR_DEPARTMENT_OTHER)

## 2024-05-11 ENCOUNTER — Encounter (HOSPITAL_COMMUNITY)

## 2024-05-13 ENCOUNTER — Encounter (HOSPITAL_COMMUNITY): Payer: Self-pay | Admitting: Medical

## 2024-05-13 ENCOUNTER — Ambulatory Visit (HOSPITAL_COMMUNITY): Admission: RE | Admit: 2024-05-13 | Source: Ambulatory Visit | Admitting: Urology

## 2024-05-13 ENCOUNTER — Encounter (HOSPITAL_COMMUNITY): Admission: RE | Payer: Self-pay | Source: Ambulatory Visit

## 2024-05-13 SURGERY — TURBT, WITH CHEMOTHERAPEUTIC AGENT INSTILLATION INTO BLADDER
Anesthesia: General

## 2024-05-25 ENCOUNTER — Ambulatory Visit: Admitting: Neurology

## 2024-06-18 ENCOUNTER — Ambulatory Visit (HOSPITAL_COMMUNITY): Admitting: Internal Medicine
